# Patient Record
Sex: Female | Born: 1970 | Race: White | Hispanic: No | Smoking: Current every day smoker
Health system: Southern US, Community
[De-identification: ages and names within clinical notes are randomized; demographics above are authoritative.]

## PROBLEM LIST (undated history)

## (undated) DIAGNOSIS — T7840XA Allergy, unspecified, initial encounter: Secondary | ICD-10-CM

## (undated) DIAGNOSIS — F419 Anxiety disorder, unspecified: Secondary | ICD-10-CM

## (undated) DIAGNOSIS — F319 Bipolar disorder, unspecified: Secondary | ICD-10-CM

## (undated) DIAGNOSIS — G709 Myoneural disorder, unspecified: Secondary | ICD-10-CM

## (undated) DIAGNOSIS — J449 Chronic obstructive pulmonary disease, unspecified: Secondary | ICD-10-CM

## (undated) DIAGNOSIS — G4730 Sleep apnea, unspecified: Secondary | ICD-10-CM

## (undated) DIAGNOSIS — J45909 Unspecified asthma, uncomplicated: Secondary | ICD-10-CM

## (undated) DIAGNOSIS — F1910 Other psychoactive substance abuse, uncomplicated: Secondary | ICD-10-CM

## (undated) DIAGNOSIS — E119 Type 2 diabetes mellitus without complications: Secondary | ICD-10-CM

## (undated) DIAGNOSIS — M1990 Unspecified osteoarthritis, unspecified site: Secondary | ICD-10-CM

## (undated) DIAGNOSIS — F32A Depression, unspecified: Secondary | ICD-10-CM

## (undated) DIAGNOSIS — F329 Major depressive disorder, single episode, unspecified: Secondary | ICD-10-CM

## (undated) DIAGNOSIS — C801 Malignant (primary) neoplasm, unspecified: Secondary | ICD-10-CM

## (undated) DIAGNOSIS — I10 Essential (primary) hypertension: Secondary | ICD-10-CM

## (undated) DIAGNOSIS — M810 Age-related osteoporosis without current pathological fracture: Secondary | ICD-10-CM

## (undated) HISTORY — DX: Major depressive disorder, single episode, unspecified: F32.9

## (undated) HISTORY — DX: Depression, unspecified: F32.A

## (undated) HISTORY — DX: Myoneural disorder, unspecified: G70.9

## (undated) HISTORY — DX: Chronic obstructive pulmonary disease, unspecified: J44.9

## (undated) HISTORY — DX: Sleep apnea, unspecified: G47.30

## (undated) HISTORY — DX: Allergy, unspecified, initial encounter: T78.40XA

## (undated) HISTORY — PX: ANKLE SURGERY: SHX546

## (undated) HISTORY — DX: Unspecified osteoarthritis, unspecified site: M19.90

## (undated) HISTORY — DX: Anxiety disorder, unspecified: F41.9

## (undated) HISTORY — DX: Age-related osteoporosis without current pathological fracture: M81.0

## (undated) HISTORY — DX: Bipolar disorder, unspecified: F31.9

---

## 1989-07-25 HISTORY — PX: PERIPHERAL VASCULAR THROMBECTOMY: CATH118306

## 2004-09-25 ENCOUNTER — Emergency Department: Payer: Self-pay | Admitting: Emergency Medicine

## 2007-02-18 ENCOUNTER — Inpatient Hospital Stay: Payer: Self-pay | Admitting: General Practice

## 2007-03-09 ENCOUNTER — Emergency Department: Payer: Self-pay | Admitting: Emergency Medicine

## 2008-02-26 ENCOUNTER — Inpatient Hospital Stay: Payer: Self-pay | Admitting: Internal Medicine

## 2008-02-26 ENCOUNTER — Inpatient Hospital Stay: Payer: Self-pay | Admitting: Psychiatry

## 2010-12-21 ENCOUNTER — Emergency Department: Payer: Self-pay | Admitting: Emergency Medicine

## 2010-12-22 ENCOUNTER — Emergency Department: Payer: Self-pay | Admitting: Emergency Medicine

## 2011-07-07 ENCOUNTER — Inpatient Hospital Stay: Payer: Self-pay | Admitting: Psychiatry

## 2011-10-04 ENCOUNTER — Emergency Department: Payer: Self-pay | Admitting: *Deleted

## 2011-10-04 LAB — CBC
HCT: 36.5 % (ref 35.0–47.0)
HGB: 12.2 g/dL (ref 12.0–16.0)
MCHC: 33.3 g/dL (ref 32.0–36.0)
MCV: 89 fL (ref 80–100)
Platelet: 213 10*3/uL (ref 150–440)
RDW: 14 % (ref 11.5–14.5)

## 2011-10-04 LAB — ETHANOL: Ethanol %: 0.109 % — ABNORMAL HIGH (ref 0.000–0.080)

## 2011-10-04 LAB — COMPREHENSIVE METABOLIC PANEL
Albumin: 3.9 g/dL (ref 3.4–5.0)
Alkaline Phosphatase: 60 U/L (ref 50–136)
Calcium, Total: 8.3 mg/dL — ABNORMAL LOW (ref 8.5–10.1)
Co2: 22 mmol/L (ref 21–32)
EGFR (Non-African Amer.): 59 — ABNORMAL LOW
Osmolality: 279 (ref 275–301)
SGOT(AST): 31 U/L (ref 15–37)
SGPT (ALT): 29 U/L

## 2011-10-04 LAB — SALICYLATE LEVEL: Salicylates, Serum: 4.2 mg/dL — ABNORMAL HIGH

## 2011-10-05 LAB — DRUG SCREEN, URINE
Amphetamines, Ur Screen: NEGATIVE (ref ?–1000)
Barbiturates, Ur Screen: NEGATIVE (ref ?–200)
Benzodiazepine, Ur Scrn: NEGATIVE (ref ?–200)
Cannabinoid 50 Ng, Ur ~~LOC~~: POSITIVE (ref ?–50)
Cocaine Metabolite,Ur ~~LOC~~: POSITIVE (ref ?–300)
MDMA (Ecstasy)Ur Screen: NEGATIVE (ref ?–500)
Methadone, Ur Screen: NEGATIVE (ref ?–300)
Phencyclidine (PCP) Ur S: NEGATIVE (ref ?–25)
Tricyclic, Ur Screen: NEGATIVE (ref ?–1000)

## 2011-10-05 LAB — URINALYSIS, COMPLETE
Bilirubin,UR: NEGATIVE
Hyaline Cast: 21
Ph: 5 (ref 4.5–8.0)
Protein: 30
Specific Gravity: 1.02 (ref 1.003–1.030)
Squamous Epithelial: 4
WBC UR: 29 /HPF (ref 0–5)

## 2011-10-06 ENCOUNTER — Emergency Department: Payer: Self-pay | Admitting: Emergency Medicine

## 2011-12-04 ENCOUNTER — Emergency Department: Payer: Self-pay | Admitting: Emergency Medicine

## 2011-12-04 LAB — URINALYSIS, COMPLETE
Bacteria: NONE SEEN
Glucose,UR: NEGATIVE mg/dL (ref 0–75)
Nitrite: NEGATIVE
Ph: 6 (ref 4.5–8.0)
Protein: 100
RBC,UR: 12 /HPF (ref 0–5)
Specific Gravity: 1.027 (ref 1.003–1.030)
WBC UR: 10 /HPF (ref 0–5)

## 2011-12-06 ENCOUNTER — Emergency Department: Payer: Self-pay | Admitting: Emergency Medicine

## 2011-12-06 LAB — DRUG SCREEN, URINE
Benzodiazepine, Ur Scrn: NEGATIVE (ref ?–200)
Cannabinoid 50 Ng, Ur ~~LOC~~: NEGATIVE (ref ?–50)
Cocaine Metabolite,Ur ~~LOC~~: NEGATIVE (ref ?–300)
MDMA (Ecstasy)Ur Screen: NEGATIVE (ref ?–500)
Methadone, Ur Screen: NEGATIVE (ref ?–300)

## 2011-12-06 LAB — URINALYSIS, COMPLETE
Bilirubin,UR: NEGATIVE
Ketone: NEGATIVE
Ph: 5 (ref 4.5–8.0)
Protein: 30
RBC,UR: 7 /HPF (ref 0–5)
Specific Gravity: 1.019 (ref 1.003–1.030)
Squamous Epithelial: 18
WBC UR: 9 /HPF (ref 0–5)

## 2012-04-03 ENCOUNTER — Ambulatory Visit: Payer: Self-pay

## 2012-04-04 ENCOUNTER — Ambulatory Visit: Payer: Self-pay

## 2012-10-15 ENCOUNTER — Emergency Department: Payer: Self-pay | Admitting: Emergency Medicine

## 2012-10-15 LAB — URINALYSIS, COMPLETE
Bilirubin,UR: NEGATIVE
Glucose,UR: NEGATIVE mg/dL (ref 0–75)
Ketone: NEGATIVE
Nitrite: NEGATIVE
Ph: 6 (ref 4.5–8.0)
RBC,UR: 1 /HPF (ref 0–5)
Squamous Epithelial: <1
WBC UR: 5 /HPF (ref 0–5)

## 2012-10-15 LAB — COMPREHENSIVE METABOLIC PANEL
Alkaline Phosphatase: 82 U/L (ref 50–136)
Anion Gap: 5 — ABNORMAL LOW (ref 7–16)
Bilirubin,Total: 0.2 mg/dL (ref 0.2–1.0)
Calcium, Total: 8.3 mg/dL — ABNORMAL LOW (ref 8.5–10.1)
Chloride: 108 mmol/L — ABNORMAL HIGH (ref 98–107)
Co2: 28 mmol/L (ref 21–32)
Creatinine: 0.83 mg/dL (ref 0.60–1.30)
EGFR (African American): >60
Osmolality: 281 (ref 275–301)
Potassium: 3 mmol/L — ABNORMAL LOW (ref 3.5–5.1)
SGOT(AST): 31 U/L (ref 15–37)
SGPT (ALT): 29 U/L (ref 12–78)
Sodium: 141 mmol/L (ref 136–145)

## 2012-10-15 LAB — CBC
HCT: 40.4 % (ref 35.0–47.0)
HGB: 13.3 g/dL (ref 12.0–16.0)
MCH: 29.6 pg (ref 26.0–34.0)
MCHC: 33 g/dL (ref 32.0–36.0)
Platelet: 280 10*3/uL (ref 150–440)
RBC: 4.5 10*6/uL (ref 3.80–5.20)
WBC: 8.3 10*3/uL (ref 3.6–11.0)

## 2012-10-15 LAB — DRUG SCREEN, URINE
Amphetamines, Ur Screen: NEGATIVE (ref ?–1000)
Benzodiazepine, Ur Scrn: NEGATIVE (ref ?–200)
Cannabinoid 50 Ng, Ur ~~LOC~~: NEGATIVE (ref ?–50)
Cocaine Metabolite,Ur ~~LOC~~: POSITIVE (ref ?–300)
MDMA (Ecstasy)Ur Screen: NEGATIVE (ref ?–500)
Methadone, Ur Screen: NEGATIVE (ref ?–300)
Phencyclidine (PCP) Ur S: NEGATIVE (ref ?–25)
Tricyclic, Ur Screen: NEGATIVE (ref ?–1000)

## 2012-10-15 LAB — ETHANOL
Ethanol %: 0.232 % — ABNORMAL HIGH (ref 0.000–0.080)
Ethanol: 232 mg/dL

## 2012-10-15 LAB — ACETAMINOPHEN LEVEL: Acetaminophen: <2 ug/mL

## 2012-10-15 LAB — PREGNANCY, URINE: Pregnancy Test, Urine: NEGATIVE m[IU]/mL

## 2012-10-15 LAB — SALICYLATE LEVEL: Salicylates, Serum: 13.4 mg/dL — ABNORMAL HIGH

## 2012-11-25 ENCOUNTER — Emergency Department: Payer: Self-pay | Admitting: Internal Medicine

## 2012-11-25 LAB — URINALYSIS, COMPLETE
Bilirubin,UR: NEGATIVE
Blood: NEGATIVE
Ketone: NEGATIVE
Leukocyte Esterase: NEGATIVE
Nitrite: NEGATIVE
RBC,UR: <1 /HPF (ref 0–5)

## 2013-04-01 IMAGING — US ULTRASOUND LEFT BREAST
1 series · 14 of 23 positions shown · non-contrast
Comparison: none

REASON FOR EXAM: av lt parenchymal density [REDACTED]
COMMENTS:

PROCEDURE:     US  - US LT BREAST ([REDACTED])  - April 04, 2012  [DATE]
RESULT:     No cystic or solid abnormalities identified. It is suggested
that a 6 month followup left breast mammogram be performed to demonstrate
stability.

[Series 1: ultrasound left breast · 0.11mm/px · 14 of 23 slices shown]
[im 1/23]
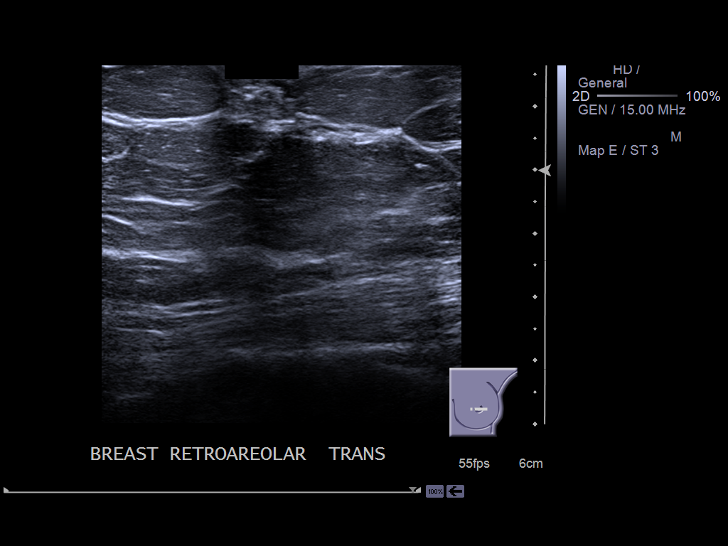
[im 3/23]
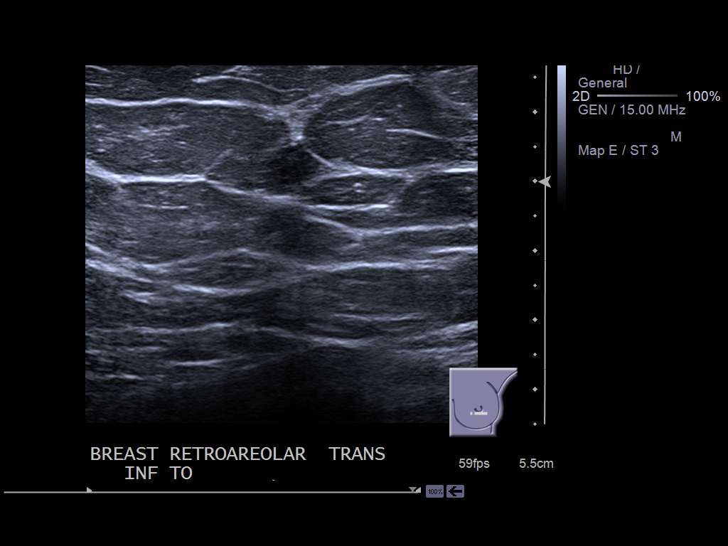
[im 5/23]
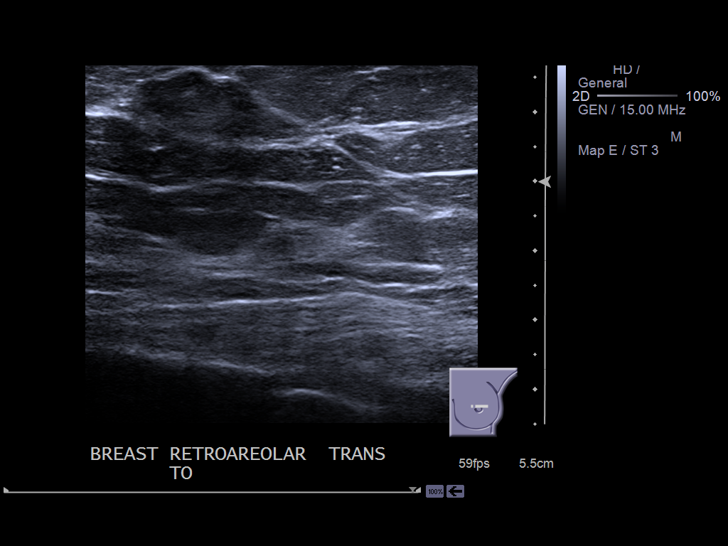
[im 6/23]
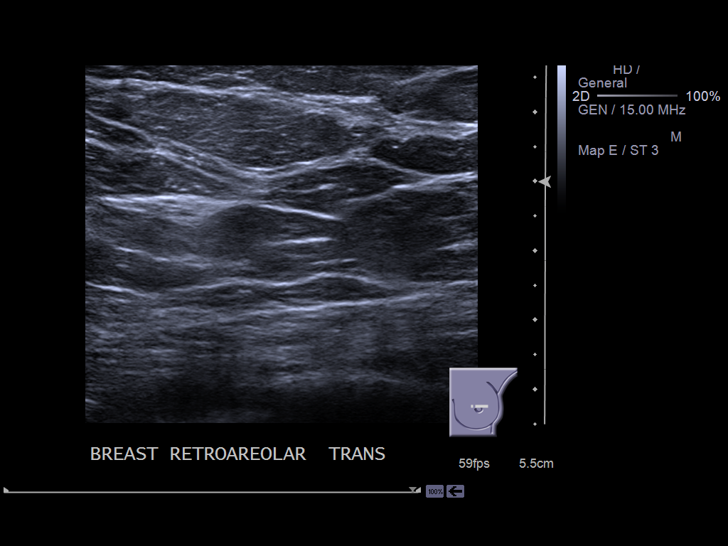
[im 8/23]
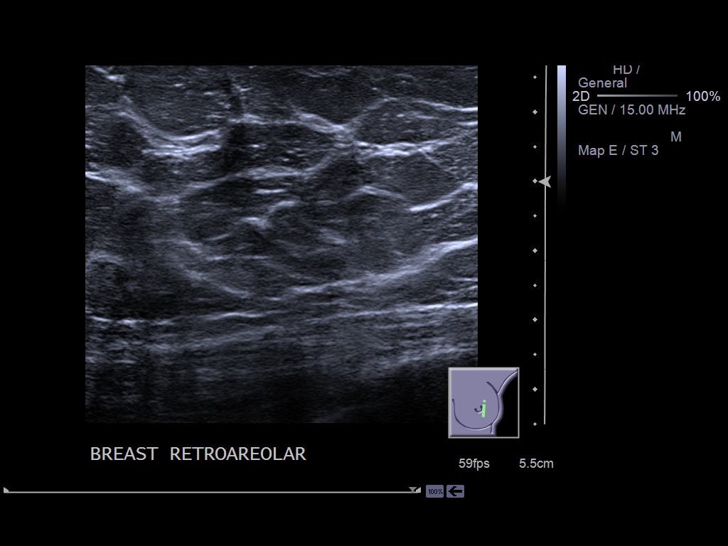
[im 10/23]
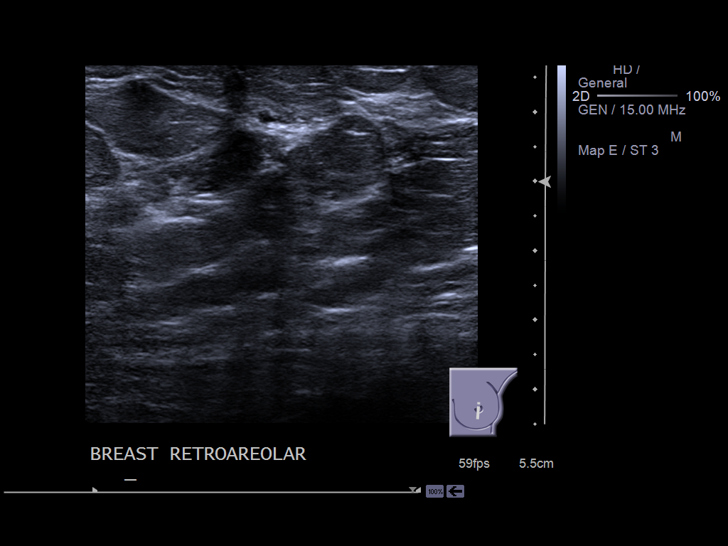
[im 11/23]
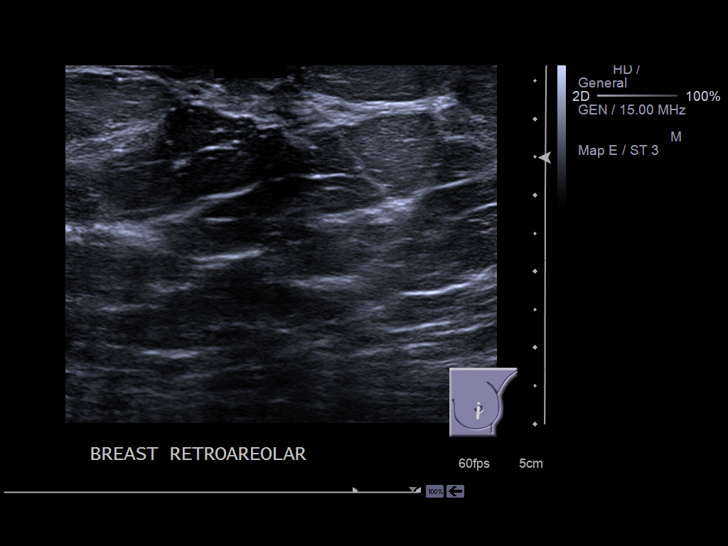
[im 13/23]
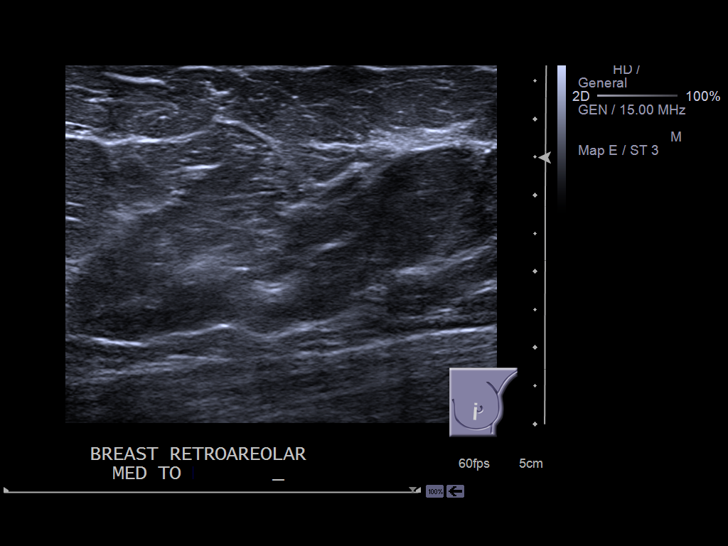
[im 14/23]
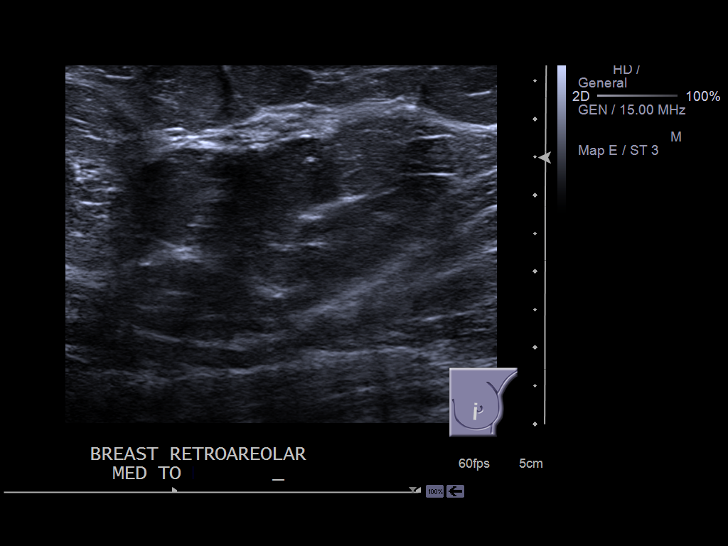
[im 16/23]
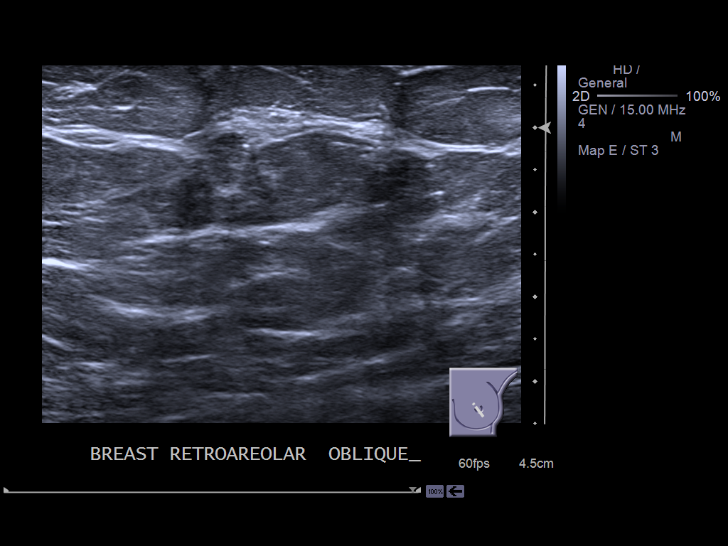
[im 18/23]
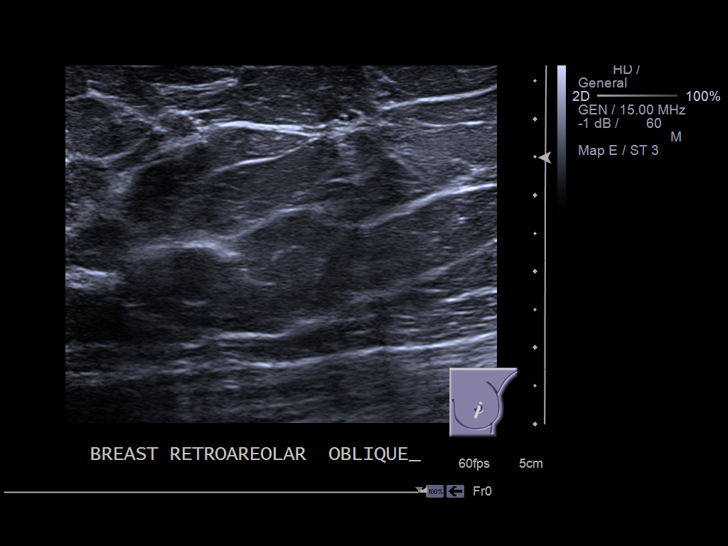
[im 19/23]
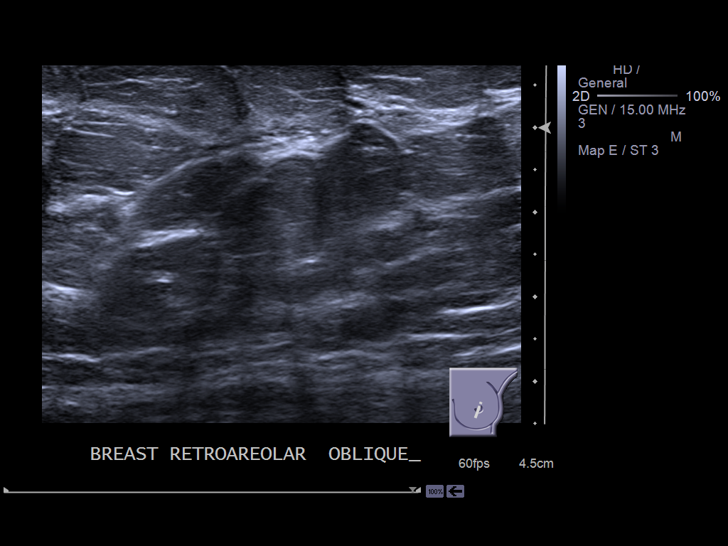
[im 21/23]
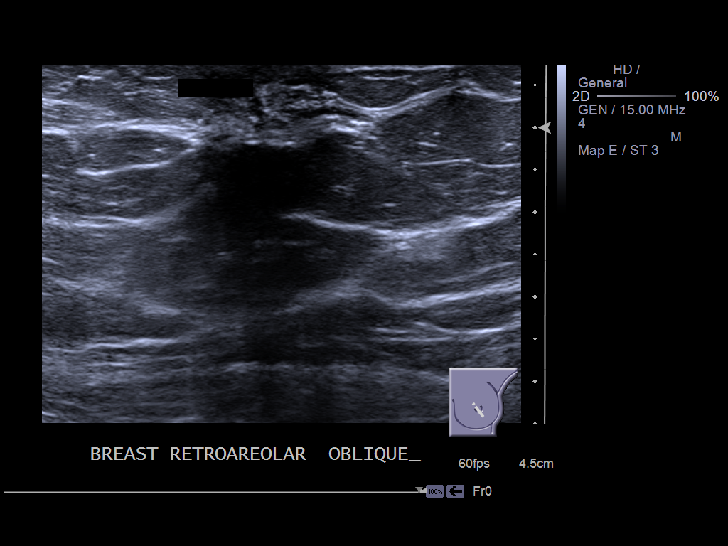
[im 23/23]
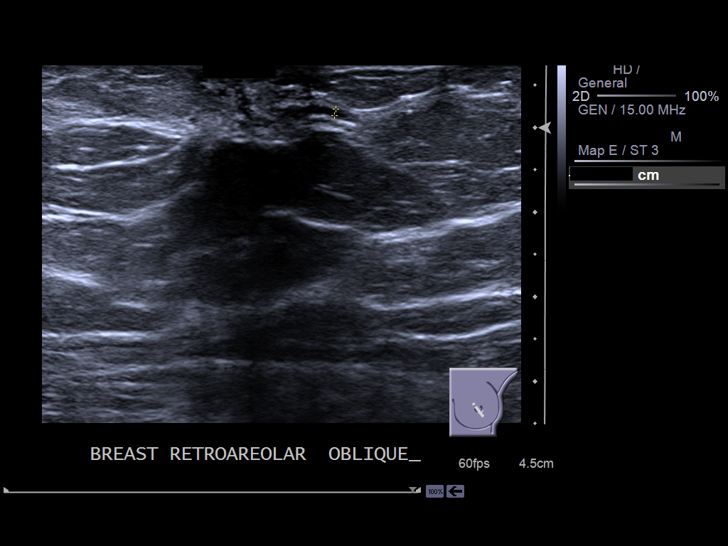

[14 of 23 positions shown; findings below may reference images not displayed]

IMPRESSION: BI-RADS: Category 3 - Probably Benign Finding - Initial
Short Interval Follow - Up Suggested

A NEGATIVE MAMMOGRAM REPORT DOES NOT PRECLUDE BIOPSY OR OTHER EVALUATION OF
A CLINICALLY PALPABLE OR OTHERWISE SUSPICIOUS MASS OR LESION. BREAST CANCER
MAY NOT BE DETECTED IN UP T0 10% OF CASES.

## 2013-04-01 IMAGING — MG MAM [facility] ADDED VIEW DIG LEFT
1 series · 5 of 5 positions shown · non-contrast
Comparison: none

REASON FOR EXAM: av lt parenchymal density [REDACTED]
COMMENTS:

[L ML · left · 5 of 5 slices shown]
[im 1/5]
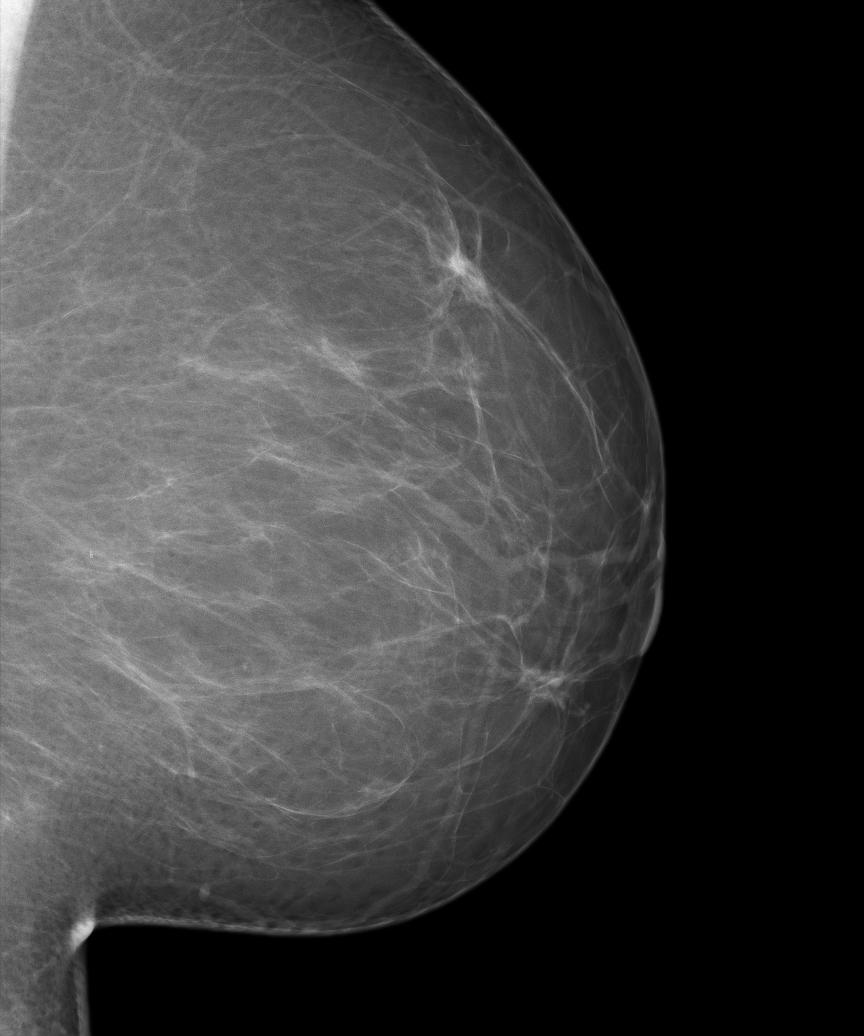
[im 2/5]
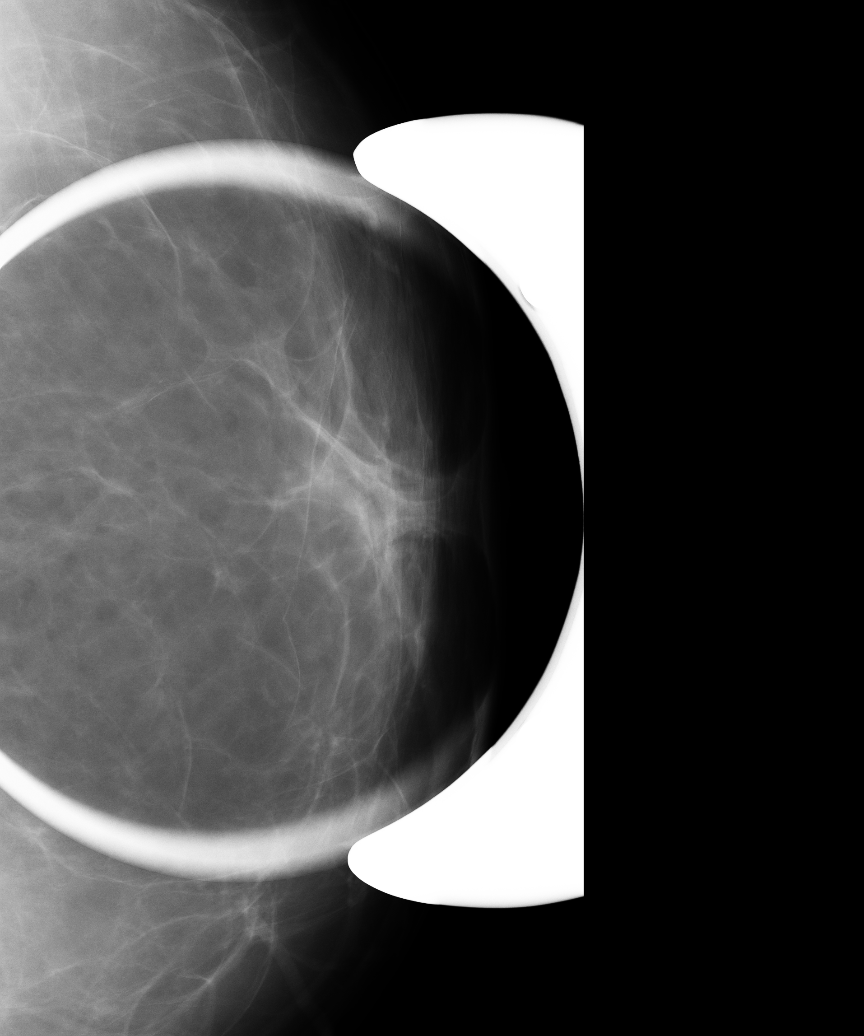
[im 3/5]
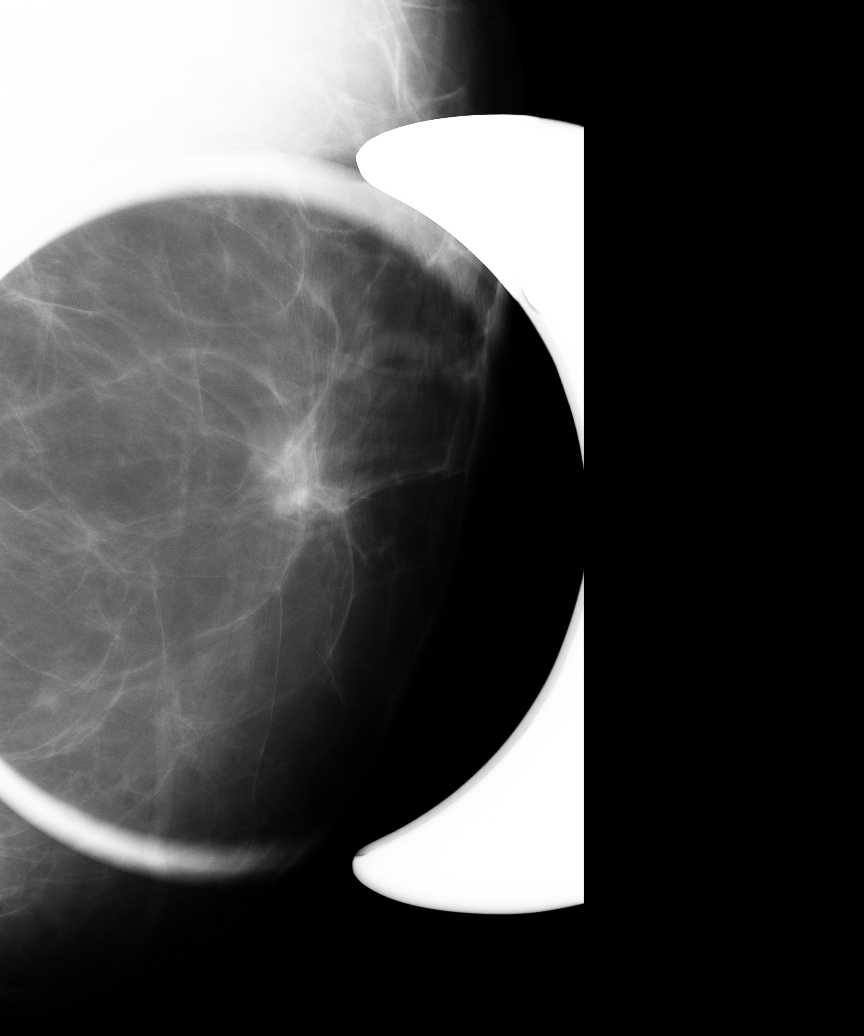
[im 4/5]
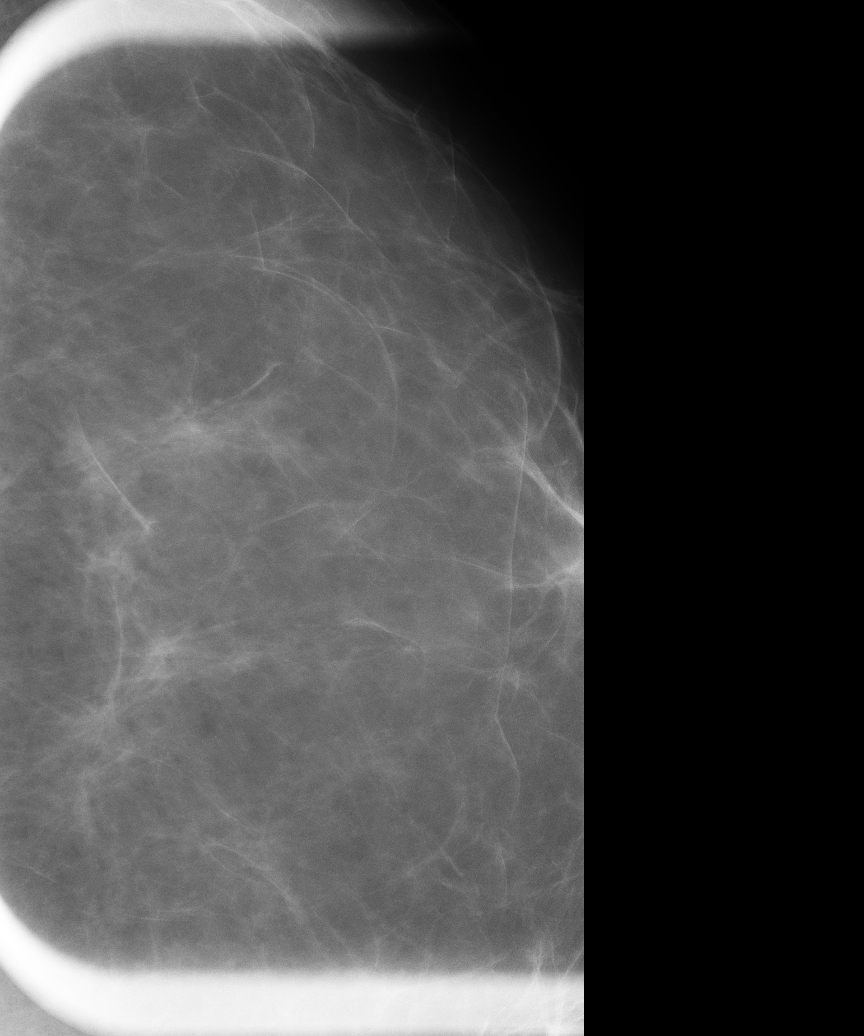
[im 5/5]
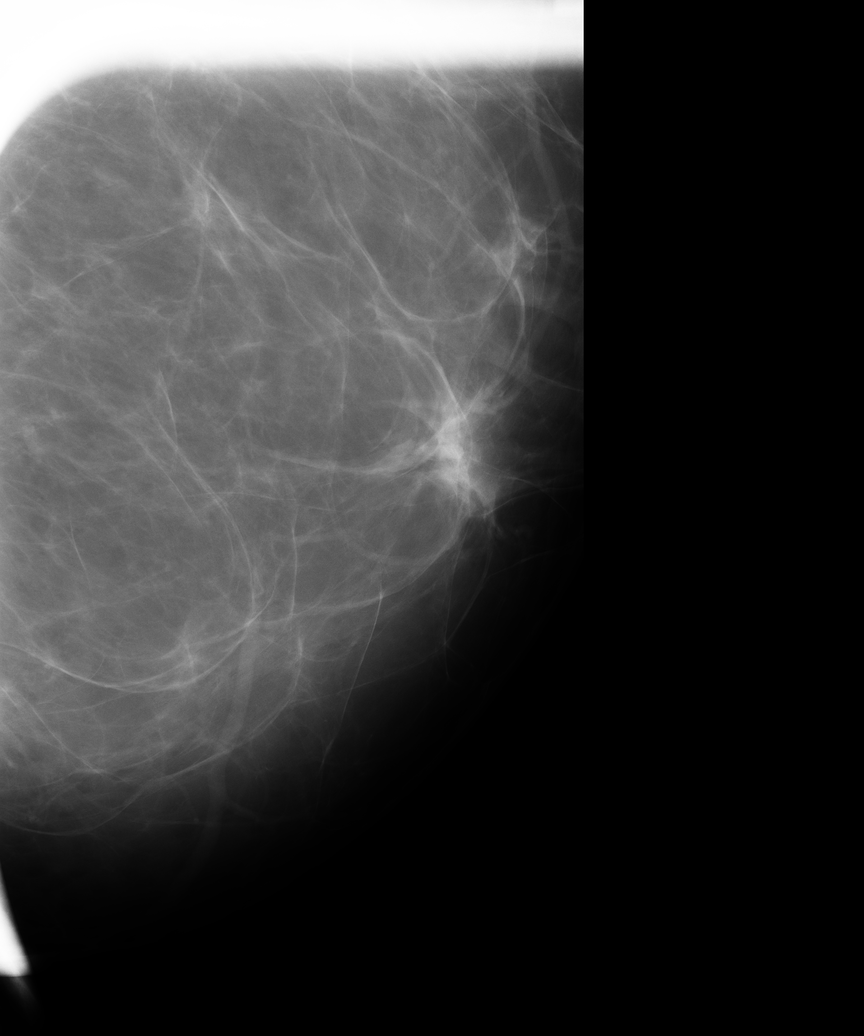

[5 of 5 positions shown; findings below may reference images not displayed]

PROCEDURE:     MAM - MAM [REDACTED] ADDED VIEW DIG LEFT  - April 04, 2012  [DATE]

RESULT:     Compression spot films performed of the retroareolar portion of
the left breast. No definite mass lesion is noted. Mild parenchymal
prominence which most likely represent overlapping parenchyma is present.
Ultrasound was performed to insure the absence of an underlying abnormality.
No abnormality identified. To assure stability followup left breast
mammogram is recommended in 6 months.
IMPRESSION: BI-RADS: Category 3 - Probably Benign Finding - Initial
Short Interval Follow - Up Suggested

A NEGATIVE MAMMOGRAM REPORT DOES NOT PRECLUDE BIOPSY OR OTHER EVALUATION OF
A CLINICALLY PALPABLE OR OTHERWISE SUSPICIOUS MASS OR LESION. BREAST CANCER
MAY NOT BE DETECTED IN UP T0 10% OF CASES.

## 2014-11-14 NOTE — Consult Note (Signed)
Details:   - Psychiatry: Patient seen. Patient took excess medication and was drinking. Current sobered up and not delirious and affect euthymic. Has good insight and is already involved in outpt treatment. No sign of current withdrawl symptoms. Not psychotic. Denies any suicidal ideation currently. Patient will be  taken off IVC and discharged home with follow up at [facility]. ER M.D. agrees to plan. Patient counceled on the dangerousness of her behavior and expresses understanding.   Electronic Signatures: Gonzella Lex (MD)  (Signed 26-Mar-14 13:46)  Authored: Details   Last Updated: 26-Mar-14 13:46 by Gonzella Lex (MD)
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
## 2014-11-16 NOTE — Consult Note (Signed)
Patient is not at risk to harm self or others. is psych-clear for discharge.  Electronic Signatures: Billie Ruddy (MD)  (Signed on 13-Mar-13 15:02)  Authored  Last Updated: 13-Mar-13 15:02 by Billie Ruddy (MD)

## 2015-10-15 ENCOUNTER — Ambulatory Visit: Payer: Self-pay

## 2015-10-22 ENCOUNTER — Ambulatory Visit: Payer: Self-pay | Admitting: Ophthalmology

## 2015-10-29 ENCOUNTER — Ambulatory Visit: Payer: Self-pay | Admitting: Ophthalmology

## 2015-10-29 ENCOUNTER — Ambulatory Visit: Payer: Self-pay | Admitting: Internal Medicine

## 2015-10-29 VITALS — BP 156/105 | HR 102

## 2015-10-29 DIAGNOSIS — J301 Allergic rhinitis due to pollen: Secondary | ICD-10-CM

## 2015-10-29 DIAGNOSIS — I10 Essential (primary) hypertension: Secondary | ICD-10-CM

## 2015-10-29 DIAGNOSIS — E119 Type 2 diabetes mellitus without complications: Secondary | ICD-10-CM | POA: Insufficient documentation

## 2015-10-29 DIAGNOSIS — E1140 Type 2 diabetes mellitus with diabetic neuropathy, unspecified: Secondary | ICD-10-CM

## 2015-10-29 DIAGNOSIS — J309 Allergic rhinitis, unspecified: Secondary | ICD-10-CM | POA: Insufficient documentation

## 2015-10-29 MED ORDER — OMEPRAZOLE 20 MG PO CPDR: 20.0000 mg | DELAYED_RELEASE_CAPSULE | Freq: Every day | ORAL | Status: DC

## 2015-10-29 MED ORDER — METFORMIN HCL 500 MG PO TABS: 500.0000 mg | ORAL_TABLET | Freq: Two times a day (BID) | ORAL | Status: DC

## 2015-10-29 MED ORDER — LORATADINE 10 MG PO TABS: 10.0000 mg | ORAL_TABLET | Freq: Every day | ORAL | Status: DC

## 2015-10-29 MED ORDER — HYDROCHLOROTHIAZIDE 25 MG PO TABS: 25.0000 mg | ORAL_TABLET | Freq: Every day | ORAL | Status: DC

## 2015-10-29 MED ORDER — GLIMEPIRIDE 2 MG PO TABS: 2.0000 mg | ORAL_TABLET | Freq: Every day | ORAL | Status: DC

## 2015-10-29 MED ORDER — CARVEDILOL 25 MG PO TABS: 25.0000 mg | ORAL_TABLET | Freq: Two times a day (BID) | ORAL | Status: DC

## 2015-10-29 NOTE — Progress Notes (Signed)
Andrea Sanford is a 45 y.o. female   SUBJECTIVE:  Pt recently out of prison, dx'd with HTN and DM in prison with weight gain there. Has gained more since home, c/o painful neuropathy and sugars averaging 250 preprandial.  ______________________________________________________________________       No past medical history on file.  No past surgical history on file.  PHYSICAL EXAM:  BP 156/105 mmHg  Pulse 102  Wt Readings from Last 3 Encounters:  No data found for Wt           BP Readings from Last 3 Encounters:  10/29/15 156/105    Constitutional: NAD Neck: supple, no thyromegaly Respiratory: CTA, no rales or wheezes Cardiovascular: RRR, no murmur, no gallop Abdomen: soft, good BS, nontender Extremities: no edema   ASSESSMENT/PLAN:  Labs and imaging studies were reviewed  DM- metformen 500mg  bid and add amaryl 2mg  qd, long diet discussion HTN- coreg 25mg  bid with HCTZ 25mg  qd, lisinopril insuff for BP GERD- omepraole qam wiehgt loss AR- claritin 1 mo f/u to ck labs and HTN

## 2015-11-26 ENCOUNTER — Ambulatory Visit: Payer: Self-pay

## 2016-01-28 ENCOUNTER — Telehealth: Payer: Self-pay

## 2016-01-28 DIAGNOSIS — I10 Essential (primary) hypertension: Secondary | ICD-10-CM

## 2016-01-28 MED ORDER — HYDROCHLOROTHIAZIDE 25 MG PO TABS: 25.0000 mg | ORAL_TABLET | Freq: Every day | ORAL | Status: DC

## 2016-02-02 ENCOUNTER — Telehealth: Payer: Self-pay

## 2016-02-02 DIAGNOSIS — K219 Gastro-esophageal reflux disease without esophagitis: Secondary | ICD-10-CM

## 2016-02-02 MED ORDER — OMEPRAZOLE 20 MG PO CPDR: 20.0000 mg | DELAYED_RELEASE_CAPSULE | Freq: Every day | ORAL | Status: DC

## 2016-02-08 NOTE — Telephone Encounter (Signed)
Done

## 2016-02-17 ENCOUNTER — Ambulatory Visit: Payer: Self-pay | Admitting: Internal Medicine

## 2016-02-18 NOTE — Telephone Encounter (Signed)
Refill complete 

## 2017-08-08 ENCOUNTER — Ambulatory Visit: Payer: Self-pay | Admitting: Pharmacy Technician

## 2017-08-08 ENCOUNTER — Encounter (INDEPENDENT_AMBULATORY_CARE_PROVIDER_SITE_OTHER): Payer: Self-pay

## 2017-08-08 DIAGNOSIS — Z79899 Other long term (current) drug therapy: Secondary | ICD-10-CM

## 2017-08-10 NOTE — Progress Notes (Signed)
Completed [facility] application and contract.  Patient agreed to all terms of the [facility] contract.    Patient to provide pay stubs for Jan 2019 when available.    Provided patient with community resource material based on her particular needs.  Patient approved to receive medication assistance through 2019, as long as eligibility criteria continues to be met.     [facility]

## 2017-08-17 ENCOUNTER — Ambulatory Visit: Payer: Self-pay

## 2017-08-29 ENCOUNTER — Ambulatory Visit: Payer: Self-pay | Admitting: Adult Health Nurse Practitioner

## 2017-08-29 ENCOUNTER — Encounter: Payer: Self-pay | Admitting: Licensed Clinical Social Worker

## 2017-08-29 ENCOUNTER — Ambulatory Visit: Payer: Self-pay | Admitting: Licensed Clinical Social Worker

## 2017-08-29 VITALS — BP 133/88 | HR 97 | Temp 98.0°F | Wt 232.5 lb

## 2017-08-29 DIAGNOSIS — E119 Type 2 diabetes mellitus without complications: Secondary | ICD-10-CM

## 2017-08-29 DIAGNOSIS — F310 Bipolar disorder, current episode hypomanic: Secondary | ICD-10-CM

## 2017-08-29 DIAGNOSIS — K219 Gastro-esophageal reflux disease without esophagitis: Secondary | ICD-10-CM

## 2017-08-29 DIAGNOSIS — E1140 Type 2 diabetes mellitus with diabetic neuropathy, unspecified: Secondary | ICD-10-CM

## 2017-08-29 DIAGNOSIS — I10 Essential (primary) hypertension: Secondary | ICD-10-CM

## 2017-08-29 MED ORDER — GLIMEPIRIDE 2 MG PO TABS: 2.0000 mg | ORAL_TABLET | Freq: Every day | ORAL | 3 refills | Status: DC

## 2017-08-29 MED ORDER — METFORMIN HCL 500 MG PO TABS: 500.0000 mg | ORAL_TABLET | Freq: Two times a day (BID) | ORAL | 3 refills | Status: DC

## 2017-08-29 MED ORDER — CARVEDILOL 25 MG PO TABS: 25.0000 mg | ORAL_TABLET | Freq: Two times a day (BID) | ORAL | 3 refills | Status: DC

## 2017-08-29 MED ORDER — HYDROCHLOROTHIAZIDE 25 MG PO TABS: 25.0000 mg | ORAL_TABLET | Freq: Every day | ORAL | 3 refills | Status: DC

## 2017-08-29 MED ORDER — GABAPENTIN 300 MG PO CAPS: 300.0000 mg | ORAL_CAPSULE | Freq: Three times a day (TID) | ORAL | 0 refills | Status: DC

## 2017-08-29 MED ORDER — OMEPRAZOLE 20 MG PO CPDR: 20.0000 mg | DELAYED_RELEASE_CAPSULE | Freq: Every day | ORAL | 3 refills | Status: DC

## 2017-08-29 NOTE — Progress Notes (Signed)
Patient recently became homeless and is staying at [facility]. She quit her job at [employer]. She is currently a patient at [facility] for medication management, peer support, and group therapy. She explains that she didn't get along with her peer support specialist and doesn't like attending groups. She reports that she is prescribed Effexor ER 150 mg daily for depression and Seroquel 200 mg at bedtime for mood stabilization by Dr. Ernie Hew at [facility]. She notes that she has the support of her brother who will help her out on occasion with transportation to appointments. She notes that she is currently on parole and has a ankle monitor for felony larceny. She notes that her criminal background has been a barrier on getting and keeping jobs. She notes that she has previously been diagnosed with Bipolar disorder and anxiety. She reports that she has a history of abusing alcohol. She notes that she is on food stamps. She reports that she was recently approved for SSI and Medicaid but isn't sure when either will kick in.

## 2017-08-29 NOTE — Progress Notes (Signed)
Patient: Andrea Sanford Female    DOB: 06/27/1971   47 y.o.   MRN: [id number] Visit Date: 08/29/2017  Today's Provider: Staci Acosta, NP   Chief Complaint  Patient presents with  . Gastroesophageal Reflux  . Nausea  . Diabetes    Neuropathy in feed-- itching, burning, pain-- worse since halving metformin dose   . Arthritis   Subjective:    HPI Pt here for medication refills.   States that her feet burn and tingle.  Started 3-4 months ago, more in the toes.  Reports checking feet daily, no open areas.   Pt states that her stomach is sour 2-3 days then it will settle down.  Reports she feels like her stomach is burning on the inside that will sometimes result in nausea or vomiting.  Pt states that she had bright red blood in her stool for 2 days in a row 2 weeks ago and then resolved on its own.   Pt states that she was recently kicked out and is now staying at the shelter.     No Known Allergies Previous Medications   CARVEDILOL (COREG) 25 MG TABLET    Take 1 tablet (25 mg total) by mouth 2 (two) times daily with a meal.   GLIMEPIRIDE (AMARYL) 2 MG TABLET    Take 1 tablet (2 mg total) by mouth daily with breakfast.   HYDROCHLOROTHIAZIDE (HYDRODIURIL) 25 MG TABLET    Take 1 tablet (25 mg total) by mouth daily.   LORATADINE (CLARITIN) 10 MG TABLET    Take 1 tablet (10 mg total) by mouth daily.   METFORMIN (GLUCOPHAGE) 500 MG TABLET    Take 1 tablet (500 mg total) by mouth 2 (two) times daily with a meal.   OMEPRAZOLE (PRILOSEC) 20 MG CAPSULE    Take 1 capsule (20 mg total) by mouth daily.    Review of Systems  All other systems reviewed and are negative.   Social History   Tobacco Use  . Smoking status: Current Every Day Smoker    Packs/day: 1.00  . Smokeless tobacco: Never Used  Substance Use Topics  . Alcohol use: Yes    Alcohol/week: 1.2 oz    Types: 2 Standard drinks or equivalent per week   Objective:   BP 133/88 (BP Location: Left Arm, Patient  Position: Sitting, Cuff Size: Normal)   Pulse 97   Temp 98 F (36.7 C)   Wt 232 lb 8 oz (105.5 kg)   Physical Exam  Constitutional: She is oriented to person, place, and time. She appears well-developed and well-nourished.  HENT:  Head: Normocephalic and atraumatic.  Eyes: Pupils are equal, round, and reactive to light.  Neck: Normal range of motion. Neck supple.  Cardiovascular: Normal rate, regular rhythm, normal heart sounds and intact distal pulses.  Pulmonary/Chest: Effort normal and breath sounds normal.  Abdominal: Soft. Bowel sounds are normal.  Musculoskeletal: Normal range of motion. She exhibits no edema.  Neurological: She is alert and oriented to person, place, and time.  Skin: Skin is warm and dry.  Psychiatric:  Anxious behaviors.  Vitals reviewed.       Assessment & Plan:         HTN:  Controlled.  Goal BP <140/90.  Continue current medication regimen.  Encourage low salt diet and exercise.   DM:  Will check A1c Encourage diabetic diet and exercise.  Continue current medication regimen.  Will prescribe gabapentin 300mg  QHS for neuropathy.  Discussed foot checks and to not  walk barefoot.   GERD:  Restart omeprazole.  Avoid triggers.   Routine labs ordered. Given iFOBT.  Referred to [facility] for therapy.  Medically managed at [facility].   FU in 4 weeks for lab review and to evaluate neuropathy management.      Staci Acosta, NP   [facility]

## 2017-08-30 LAB — COMPREHENSIVE METABOLIC PANEL
A/G RATIO: 1.5 (ref 1.2–2.2)
ALT: 13 IU/L (ref 0–32)
AST: 20 IU/L (ref 0–40)
Albumin: 4.4 g/dL (ref 3.5–5.5)
Alkaline Phosphatase: 99 IU/L (ref 39–117)
BUN/Creatinine Ratio: 16 (ref 9–23)
BUN: 14 mg/dL (ref 6–24)
Bilirubin Total: <0.2 mg/dL (ref 0.0–1.2)
CALCIUM: 9.2 mg/dL (ref 8.7–10.2)
CO2: 22 mmol/L (ref 20–29)
Chloride: 99 mmol/L (ref 96–106)
Creatinine, Ser: 0.85 mg/dL (ref 0.57–1.00)
GFR calc Af Amer: 94 mL/min/{1.73_m2} (ref >59)
GFR calc non Af Amer: 81 mL/min/{1.73_m2} (ref >59)
GLOBULIN, TOTAL: 2.9 g/dL (ref 1.5–4.5)
Glucose: 124 mg/dL — ABNORMAL HIGH (ref 65–99)
POTASSIUM: 3.9 mmol/L (ref 3.5–5.2)
SODIUM: 140 mmol/L (ref 134–144)
Total Protein: 7.3 g/dL (ref 6.0–8.5)

## 2017-08-30 LAB — LIPID PANEL
Chol/HDL Ratio: 5.6 ratio — ABNORMAL HIGH (ref 0.0–4.4)
Cholesterol, Total: 206 mg/dL — ABNORMAL HIGH (ref 100–199)
HDL: 37 mg/dL — ABNORMAL LOW (ref >39)
Triglycerides: 451 mg/dL — ABNORMAL HIGH (ref 0–149)

## 2017-08-30 LAB — CBC
HEMATOCRIT: 42.4 % (ref 34.0–46.6)
Hemoglobin: 14.1 g/dL (ref 11.1–15.9)
MCH: 29.7 pg (ref 26.6–33.0)
MCHC: 33.3 g/dL (ref 31.5–35.7)
MCV: 89 fL (ref 79–97)
PLATELETS: 271 10*3/uL (ref 150–379)
RBC: 4.75 x10E6/uL (ref 3.77–5.28)
RDW: 14.2 % (ref 12.3–15.4)
WBC: 10.5 10*3/uL (ref 3.4–10.8)

## 2017-08-30 LAB — HEMOGLOBIN A1C
ESTIMATED AVERAGE GLUCOSE: 120 mg/dL
HEMOGLOBIN A1C: 5.8 % — AB (ref 4.8–5.6)

## 2017-08-30 LAB — TSH: TSH: 1.87 u[IU]/mL (ref 0.450–4.500)

## 2017-08-31 ENCOUNTER — Telehealth: Payer: Self-pay

## 2017-08-31 ENCOUNTER — Other Ambulatory Visit: Payer: Self-pay

## 2017-08-31 ENCOUNTER — Ambulatory Visit: Payer: Self-pay

## 2017-08-31 DIAGNOSIS — Z0000 Encounter for general adult medical examination without abnormal findings: Secondary | ICD-10-CM
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
## 2017-09-02 LAB — FECAL OCCULT BLOOD, IMMUNOCHEMICAL: Fecal Occult Bld: NEGATIVE

## 2017-09-05 ENCOUNTER — Ambulatory Visit: Payer: Self-pay | Admitting: Licensed Clinical Social Worker

## 2017-09-11 ENCOUNTER — Other Ambulatory Visit: Payer: Self-pay | Admitting: Urology

## 2017-09-12 ENCOUNTER — Telehealth: Payer: Self-pay

## 2017-09-12 NOTE — Telephone Encounter (Signed)
Gave negative stool sample results to pt.

## 2017-09-14 ENCOUNTER — Emergency Department
Admission: EM | Admit: 2017-09-14 | Discharge: 2017-09-14 | Disposition: A | Discharge status: Home / Self Care | Payer: Medicaid Other | Attending: Student in an Organized Health Care Education/Training Program | Admitting: Student in an Organized Health Care Education/Training Program

## 2017-09-14 ENCOUNTER — Emergency Department: Payer: Medicaid Other

## 2017-09-14 ENCOUNTER — Other Ambulatory Visit: Payer: Self-pay

## 2017-09-14 ENCOUNTER — Encounter: Payer: Self-pay | Admitting: Emergency Medicine

## 2017-09-14 DIAGNOSIS — Z7984 Long term (current) use of oral hypoglycemic drugs: Secondary | ICD-10-CM | POA: Diagnosis not present

## 2017-09-14 DIAGNOSIS — G44319 Acute post-traumatic headache, not intractable: Secondary | ICD-10-CM | POA: Insufficient documentation

## 2017-09-14 DIAGNOSIS — R51 Headache: Secondary | ICD-10-CM | POA: Diagnosis present

## 2017-09-14 DIAGNOSIS — Z79899 Other long term (current) drug therapy: Secondary | ICD-10-CM | POA: Diagnosis not present

## 2017-09-14 DIAGNOSIS — I10 Essential (primary) hypertension: Secondary | ICD-10-CM | POA: Insufficient documentation

## 2017-09-14 DIAGNOSIS — E1140 Type 2 diabetes mellitus with diabetic neuropathy, unspecified: Secondary | ICD-10-CM | POA: Diagnosis not present

## 2017-09-14 DIAGNOSIS — F17200 Nicotine dependence, unspecified, uncomplicated: Secondary | ICD-10-CM | POA: Insufficient documentation

## 2017-09-14 MED ORDER — PROCHLORPERAZINE EDISYLATE 5 MG/ML IJ SOLN
INTRAMUSCULAR | Status: AC
  Administered 2017-09-14: 10 mg via INTRAMUSCULAR
  Filled 2017-09-14: qty 2

## 2017-09-14 MED ORDER — BUTALBITAL-APAP-CAFFEINE 50-325-40 MG PO TABS
1.0000 | ORAL_TABLET | Freq: Four times a day (QID) | ORAL | 0 refills | Status: AC | PRN
Start: 2017-09-14 — End: 2018-09-14

## 2017-09-14 MED ORDER — PROCHLORPERAZINE EDISYLATE 5 MG/ML IJ SOLN
10.0000 mg | Freq: Once | INTRAMUSCULAR | Status: AC
  Administered 2017-09-14: 10 mg via INTRAMUSCULAR

## 2017-09-14 MED ORDER — KETOROLAC TROMETHAMINE 30 MG/ML IJ SOLN
15.0000 mg | Freq: Once | INTRAMUSCULAR | Status: AC
  Administered 2017-09-14: 15 mg via INTRAMUSCULAR

## 2017-09-14 MED ORDER — KETOROLAC TROMETHAMINE 30 MG/ML IJ SOLN
INTRAMUSCULAR | Status: AC
  Administered 2017-09-14: 15 mg via INTRAMUSCULAR
  Filled 2017-09-14: qty 1
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
## 2017-09-14 NOTE — Discharge Instructions (Signed)

## 2017-09-26 ENCOUNTER — Ambulatory Visit: Payer: Self-pay

## 2017-09-26 ENCOUNTER — Ambulatory Visit: Payer: Self-pay | Admitting: Licensed Clinical Social Worker

## 2018-02-16 ENCOUNTER — Encounter: Payer: Medicaid Other | Admitting: Podiatry

## 2018-02-18 NOTE — Progress Notes (Signed)
This encounter was created in error - please disregard.

## 2018-03-02 ENCOUNTER — Encounter: Payer: Self-pay | Admitting: Podiatry

## 2018-03-02 ENCOUNTER — Ambulatory Visit: Payer: Medicaid Other

## 2018-03-02 ENCOUNTER — Ambulatory Visit: Payer: Medicaid Other | Admitting: Podiatry

## 2018-03-02 VITALS — BP 119/74 | HR 83 | Resp 16

## 2018-03-02 DIAGNOSIS — R234 Changes in skin texture: Secondary | ICD-10-CM | POA: Diagnosis not present

## 2018-03-05 NOTE — Progress Notes (Signed)
   HPI: 47 year old female presenting today as a new patient with a chief complaint of callused, cracked, dry skin of the bilateral great toes that has been ongoing for the past several years. She reports associated redness and swelling of the lateral border of the left hallux. She has had the areas filed down and applied various creams with no significant relief. There are no modifying factors noted. Patient is here for further evaluation and treatment.   Past Medical History:  Diagnosis Date  . Anxiety   . Depression      Physical Exam: General: The patient is alert and oriented x3 in no acute distress.  Dermatology: Skin fissures noted to bilateral great toes. Skin is warm, dry and supple bilateral lower extremities.   Vascular: Palpable pedal pulses bilaterally. No edema or erythema noted. Capillary refill within normal limits.  Neurological: Epicritic and protective threshold grossly intact bilaterally.   Musculoskeletal Exam: Range of motion within normal limits to all pedal and ankle joints bilateral. Muscle strength 5/5 in all groups bilateral.   Assessment: 1. Skin fissures bilateral great toes    Plan of Care:  1. Patient evaluated.  2. Recommended good foot lotion.  3. Patient goes barefoot all the time at home on hardwood floors. Advised against going barefoot.  4. Recommended good shoe gear.  5. Return to clinic as needed.       Edrick Kins, DPM [facility]  Dr. Edrick Kins, DPM    [address], [place] [address]                Office [phone]  Fax [phone]

## 2018-03-23 ENCOUNTER — Other Ambulatory Visit: Payer: Self-pay | Admitting: Student

## 2018-03-23 DIAGNOSIS — R112 Nausea with vomiting, unspecified: Secondary | ICD-10-CM

## 2018-03-23 DIAGNOSIS — R1084 Generalized abdominal pain: Secondary | ICD-10-CM

## 2018-03-23 DIAGNOSIS — R198 Other specified symptoms and signs involving the digestive system and abdomen: Secondary | ICD-10-CM

## 2018-03-27 ENCOUNTER — Other Ambulatory Visit
Admission: RE | Admit: 2018-03-27 | Discharge: 2018-03-27 | Disposition: A | Discharge status: Home / Self Care | Payer: Medicaid Other | Source: Ambulatory Visit | Attending: Student | Admitting: Student

## 2018-03-27 DIAGNOSIS — R198 Other specified symptoms and signs involving the digestive system and abdomen: Secondary | ICD-10-CM | POA: Diagnosis not present

## 2018-03-27 LAB — GASTROINTESTINAL PANEL BY PCR, STOOL (REPLACES STOOL CULTURE)
ADENOVIRUS F40/41: NOT DETECTED
Astrovirus: NOT DETECTED
CAMPYLOBACTER SPECIES: NOT DETECTED
CRYPTOSPORIDIUM: NOT DETECTED
CYCLOSPORA CAYETANENSIS: NOT DETECTED
ENTEROPATHOGENIC E COLI (EPEC): NOT DETECTED
Entamoeba histolytica: NOT DETECTED
Enteroaggregative E coli (EAEC): NOT DETECTED
Enterotoxigenic E coli (ETEC): NOT DETECTED
GIARDIA LAMBLIA: NOT DETECTED
Norovirus GI/GII: NOT DETECTED
PLESIMONAS SHIGELLOIDES: NOT DETECTED
Rotavirus A: NOT DETECTED
Salmonella species: NOT DETECTED
Sapovirus (I, II, IV, and V): NOT DETECTED
Shiga like toxin producing E coli (STEC): NOT DETECTED
Shigella/Enteroinvasive E coli (EIEC): NOT DETECTED
VIBRIO SPECIES: NOT DETECTED
Vibrio cholerae: NOT DETECTED
Yersinia enterocolitica: NOT DETECTED

## 2018-03-27 LAB — C DIFFICILE QUICK SCREEN W PCR REFLEX
C DIFFICILE (CDIFF) INTERP: NOT DETECTED
C Diff antigen: NEGATIVE
C Diff toxin: NEGATIVE

## 2018-03-28 LAB — CALPROTECTIN, FECAL: Calprotectin, Fecal: <16 ug/g (ref 0–120)

## 2018-03-30 ENCOUNTER — Ambulatory Visit
Admission: RE | Admit: 2018-03-30 | Discharge: 2018-03-30 | Disposition: A | Discharge status: Home / Self Care | Payer: Medicaid Other | Source: Ambulatory Visit | Attending: Student | Admitting: Student

## 2018-03-30 DIAGNOSIS — R1084 Generalized abdominal pain: Secondary | ICD-10-CM | POA: Diagnosis not present

## 2018-03-30 DIAGNOSIS — R112 Nausea with vomiting, unspecified: Secondary | ICD-10-CM | POA: Insufficient documentation

## 2018-03-30 DIAGNOSIS — K760 Fatty (change of) liver, not elsewhere classified: Secondary | ICD-10-CM | POA: Diagnosis not present

## 2018-03-30 DIAGNOSIS — R198 Other specified symptoms and signs involving the digestive system and abdomen: Secondary | ICD-10-CM | POA: Diagnosis not present

## 2018-03-30 HISTORY — DX: Type 2 diabetes mellitus without complications: E11.9

## 2018-03-30 HISTORY — DX: Unspecified asthma, uncomplicated: J45.909

## 2018-03-30 HISTORY — DX: Malignant (primary) neoplasm, unspecified: C80.1

## 2018-03-30 HISTORY — DX: Essential (primary) hypertension: I10

## 2018-03-30 MED ORDER — IOPAMIDOL (ISOVUE-300) INJECTION 61%
100.0000 mL | Freq: Once | INTRAVENOUS | Status: AC | PRN
  Administered 2018-03-30: 100 mL via INTRAVENOUS

## 2018-08-02 ENCOUNTER — Telehealth: Payer: Self-pay | Admitting: Pharmacy Technician

## 2018-08-02 NOTE — Telephone Encounter (Signed)
Patient has Medicaid.  No longer meets [facility]'s eligibility criteria.  Andrea Sanford Becvar Care Manager [facility] 

## 2019-03-27 IMAGING — CT CT ABD-PELV W/ CM
2 of 5 series · 16 of 46 positions shown, 18 images · IV contrast (iopamidol)
Comparison: None.

CLINICAL DATA: C/o vomiting x0mos, meds for nausea not helping,
diarrhea and constipation with cramping and pain, waking up during
the night with pain and diarrhea, unable to make it to bathroom, pt
feels nauseous/sick/exhausted/weary all the time for months.
Vomiting.

EXAM:
CT ABDOMEN AND PELVIS WITH CONTRAST
TECHNIQUE: Multidetector CT imaging of the abdomen and pelvis was performed
using the standard protocol following bolus administration of
intravenous contrast.
CONTRAST:  100mL WHSV2Z-500 IOPAMIDOL (WHSV2Z-500) INJECTION 61%

[Series 2: abd pelvis · axial · 0.88mm/px · z∈[-1637,-1172]mm · 13 of 105 slices shown, 15 images (1 of 2)]
[im 6/105  soft-tissue]
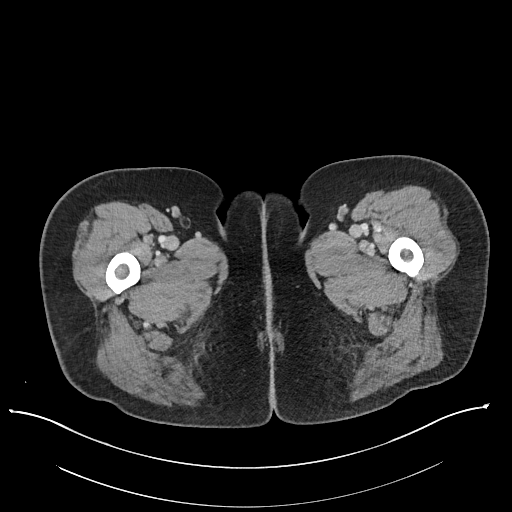
[im 6/105  bone]
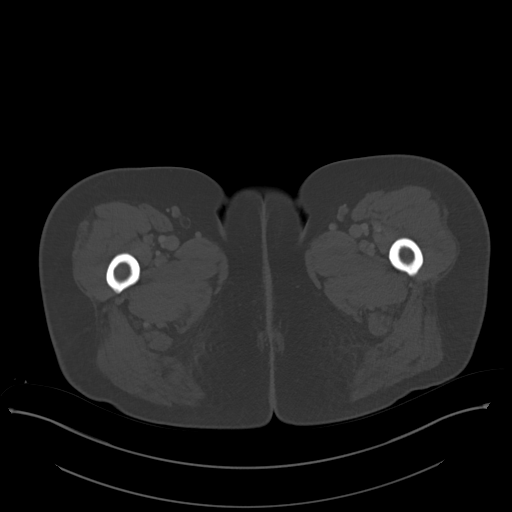
[im 16/105  soft-tissue]
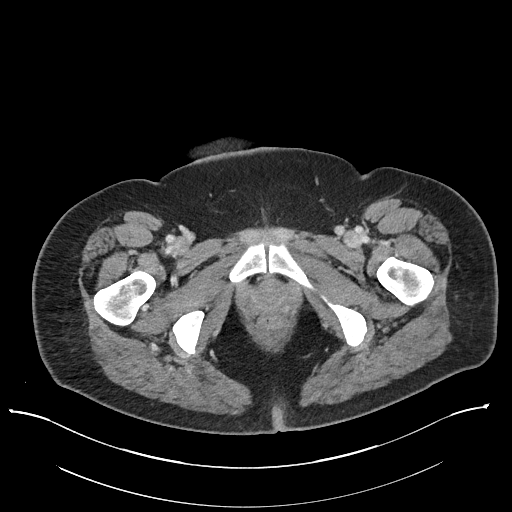
[im 21/105  soft-tissue]
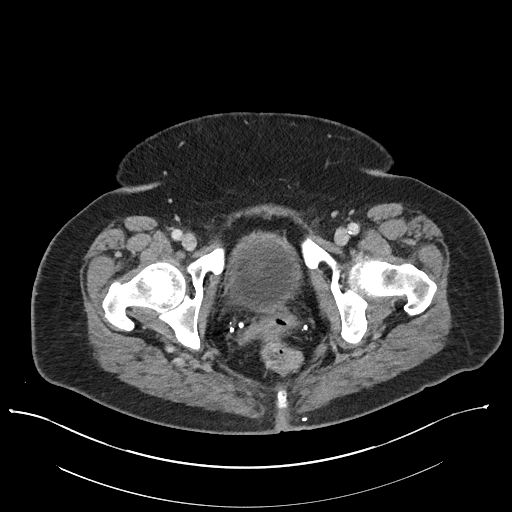
[im 32/105  soft-tissue]
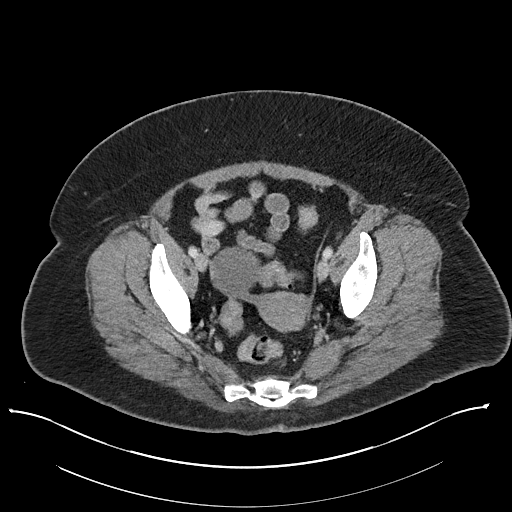
[im 37/105  soft-tissue]
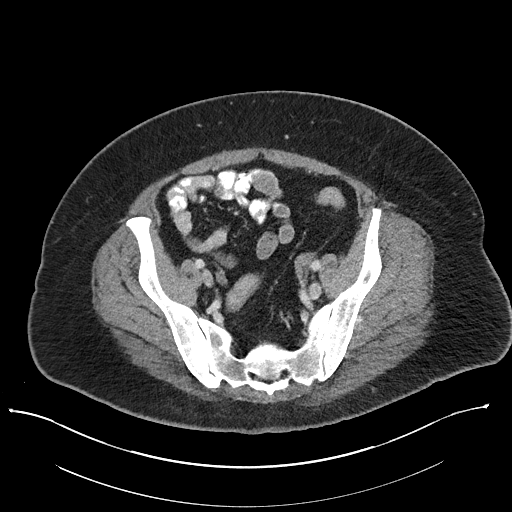
[im 47/105  soft-tissue]
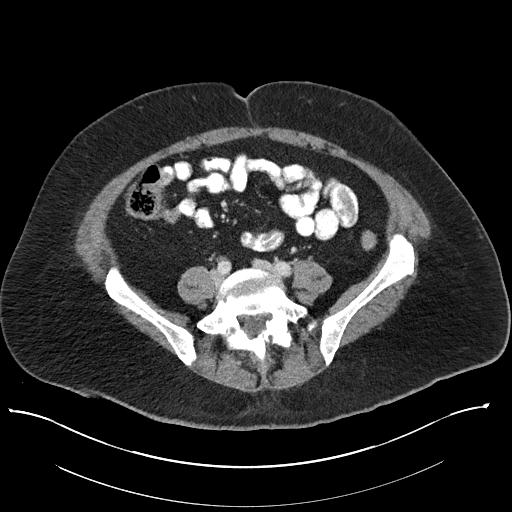
[im 53/105  soft-tissue]
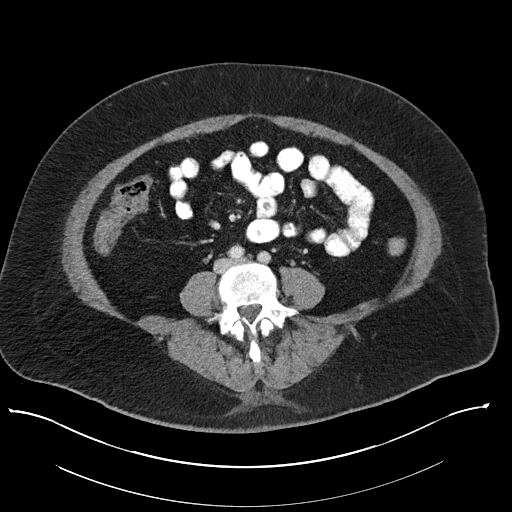
[im 58/105  soft-tissue]
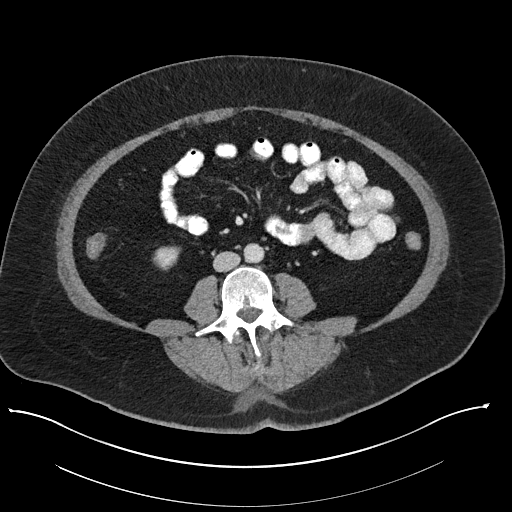
[im 68/105  soft-tissue]
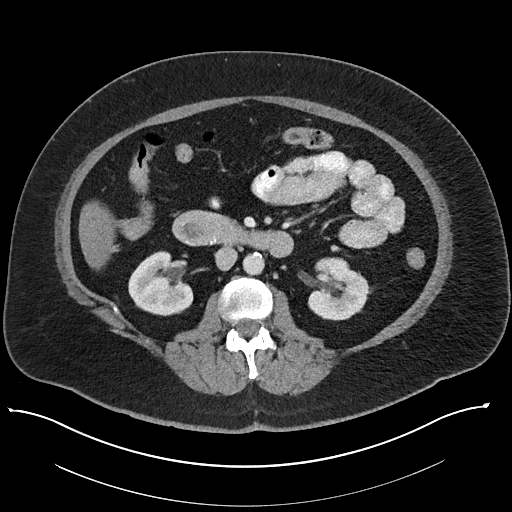
[im 68/105  bone]
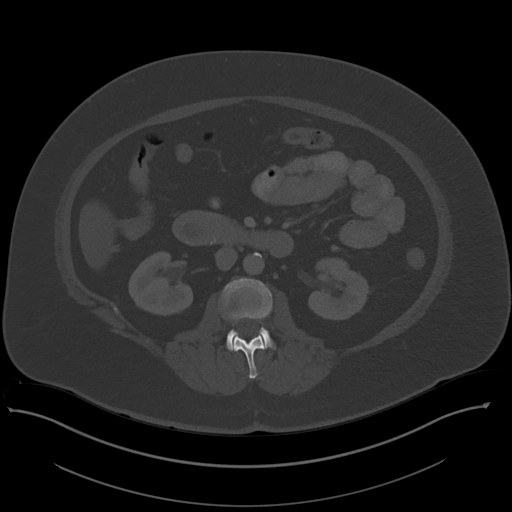
[im 73/105  soft-tissue]
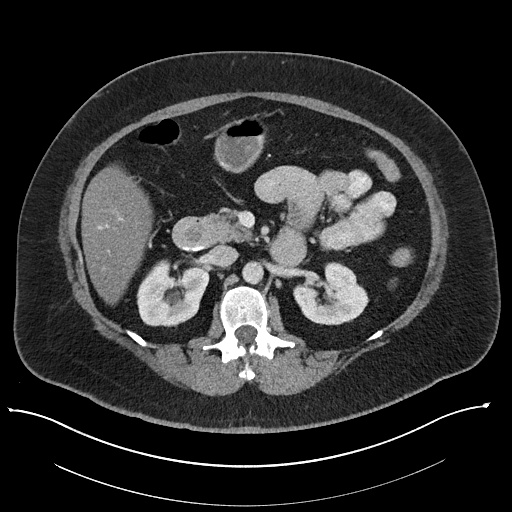
[im 84/105  soft-tissue]
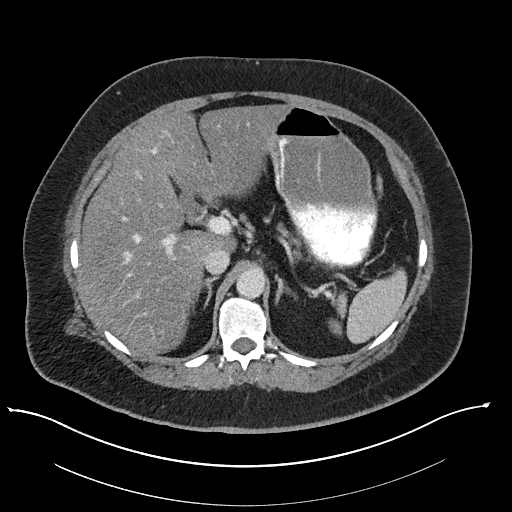
[im 89/105  soft-tissue]
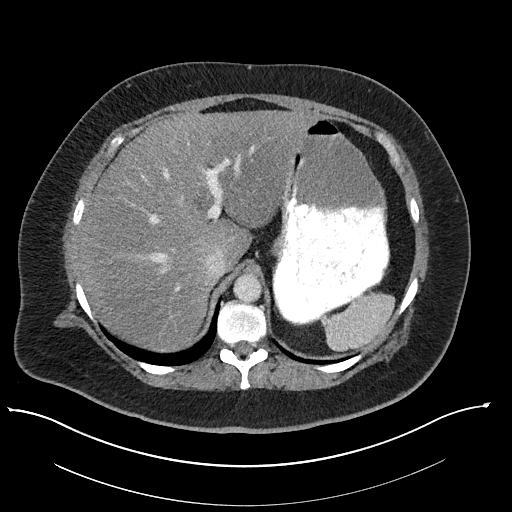
[im 99/105  soft-tissue]
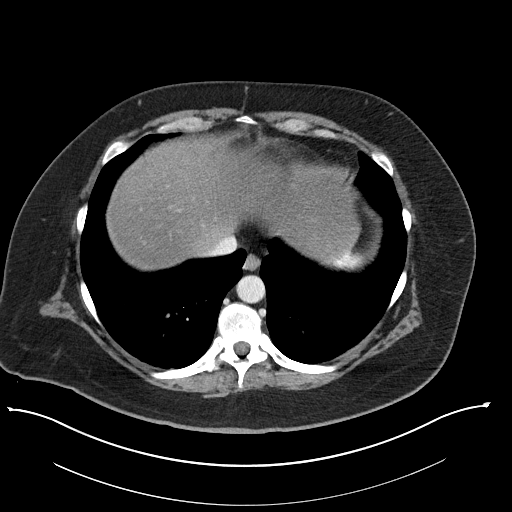

[Series 4: abd pelvis · coronal · 0.88mm/px · 3 of 163 slices shown (2 of 2)]
[im 55/163  soft-tissue]
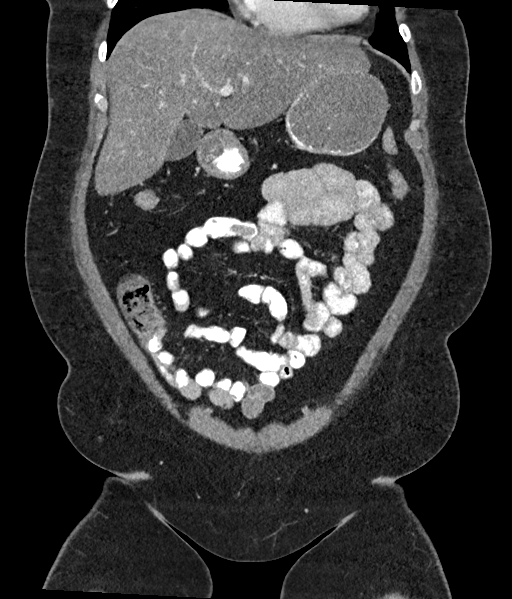
[im 73/163  soft-tissue]
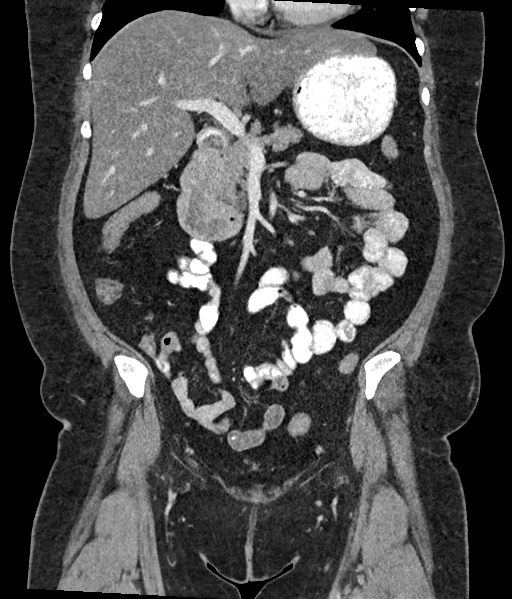
[im 91/163  soft-tissue]
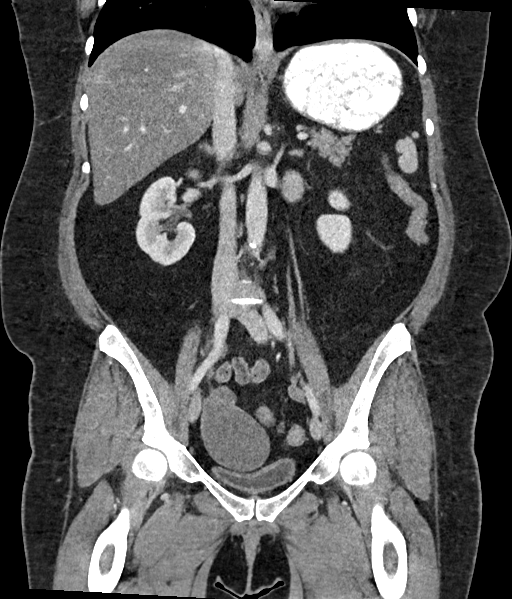

[16 of 46 positions shown; findings below may reference images not displayed]

FINDINGS: Lower chest: Lung bases are unremarkable. Heart size is normal. No
pericardial effusion or significant coronary artery calcifications.

Hepatobiliary: The liver is diffusely low attenuation. No focal
liver lesions. The gallbladder is present.

Pancreas: Unremarkable. No pancreatic ductal dilatation or
surrounding inflammatory changes.

Spleen: Normal in size without focal abnormality.

Adrenals/Urinary Tract: Normal adrenal glands. No intrarenal calculi
or mass. The ureters are normal in appearance. The bladder and
visualized portion of the urethra are normal.

Stomach/Bowel: Stomach is normal in appearance. Small bowel loops
are normal in caliber. The appendix is well seen and has a normal
appearance. Colon is normal in appearance. No inflammatory changes
or significant diverticulosis.

Vascular/Lymphatic: There is minimal atherosclerotic calcification
of the abdominal aorta. No aneurysm. Although involved by
atherosclerosis, there is vascular opacification of the celiac axis,
superior mesenteric artery, and inferior mesenteric artery. Normal
appearance of the portal venous system and inferior vena cava. No
retroperitoneal or mesenteric adenopathy.

Reproductive: The uterus is present. Within the RIGHT adnexal region
there is a low-attenuation mass measuring 5.6 x 6.4 centimeters.
LEFT adnexal region is unremarkable in appearance.

Other: No free pelvic fluid. Anterior abdominal wall is
unremarkable.

Musculoskeletal: No acute or significant osseous findings.
IMPRESSION: 1. Hepatic steatosis.
2. Normal appearance of the urinary tract.
3. RIGHT adnexal low-attenuation mass is likely ovarian in warrants
further evaluation given its size, 6.4 centimeters. Recommend pelvic
ultrasound for further characterization.
4. Normal appendix.

## 2019-07-09 DIAGNOSIS — F319 Bipolar disorder, unspecified: Secondary | ICD-10-CM | POA: Diagnosis not present

## 2019-07-11 DIAGNOSIS — Z79891 Long term (current) use of opiate analgesic: Secondary | ICD-10-CM | POA: Diagnosis not present

## 2019-07-11 DIAGNOSIS — G894 Chronic pain syndrome: Secondary | ICD-10-CM | POA: Diagnosis not present

## 2019-07-11 DIAGNOSIS — M549 Dorsalgia, unspecified: Secondary | ICD-10-CM | POA: Diagnosis not present

## 2019-07-11 DIAGNOSIS — Z79899 Other long term (current) drug therapy: Secondary | ICD-10-CM | POA: Diagnosis not present

## 2019-07-29 DIAGNOSIS — I10 Essential (primary) hypertension: Secondary | ICD-10-CM | POA: Diagnosis not present

## 2019-07-29 DIAGNOSIS — K219 Gastro-esophageal reflux disease without esophagitis: Secondary | ICD-10-CM | POA: Diagnosis not present

## 2019-07-29 DIAGNOSIS — F419 Anxiety disorder, unspecified: Secondary | ICD-10-CM | POA: Diagnosis not present

## 2019-07-29 DIAGNOSIS — Z716 Tobacco abuse counseling: Secondary | ICD-10-CM | POA: Diagnosis not present

## 2019-07-29 DIAGNOSIS — M545 Low back pain: Secondary | ICD-10-CM | POA: Diagnosis not present

## 2019-08-15 DIAGNOSIS — F419 Anxiety disorder, unspecified: Secondary | ICD-10-CM | POA: Diagnosis not present

## 2019-08-15 DIAGNOSIS — K219 Gastro-esophageal reflux disease without esophagitis: Secondary | ICD-10-CM | POA: Diagnosis not present

## 2019-08-15 DIAGNOSIS — E7800 Pure hypercholesterolemia, unspecified: Secondary | ICD-10-CM | POA: Diagnosis not present

## 2019-08-15 DIAGNOSIS — N3281 Overactive bladder: Secondary | ICD-10-CM | POA: Diagnosis not present

## 2019-08-15 DIAGNOSIS — Z716 Tobacco abuse counseling: Secondary | ICD-10-CM | POA: Diagnosis not present

## 2019-08-15 DIAGNOSIS — I10 Essential (primary) hypertension: Secondary | ICD-10-CM | POA: Diagnosis not present

## 2019-08-15 DIAGNOSIS — E119 Type 2 diabetes mellitus without complications: Secondary | ICD-10-CM | POA: Diagnosis not present

## 2019-08-28 DIAGNOSIS — F329 Major depressive disorder, single episode, unspecified: Secondary | ICD-10-CM | POA: Diagnosis not present

## 2019-08-28 DIAGNOSIS — L709 Acne, unspecified: Secondary | ICD-10-CM | POA: Diagnosis not present

## 2019-08-28 DIAGNOSIS — F419 Anxiety disorder, unspecified: Secondary | ICD-10-CM | POA: Diagnosis not present

## 2019-08-28 DIAGNOSIS — I10 Essential (primary) hypertension: Secondary | ICD-10-CM | POA: Diagnosis not present

## 2019-08-28 DIAGNOSIS — Z716 Tobacco abuse counseling: Secondary | ICD-10-CM | POA: Diagnosis not present

## 2019-09-25 DIAGNOSIS — M545 Low back pain: Secondary | ICD-10-CM | POA: Diagnosis not present

## 2019-09-25 DIAGNOSIS — Z79899 Other long term (current) drug therapy: Secondary | ICD-10-CM | POA: Diagnosis not present

## 2019-09-25 DIAGNOSIS — E559 Vitamin D deficiency, unspecified: Secondary | ICD-10-CM | POA: Diagnosis not present

## 2019-09-25 DIAGNOSIS — G894 Chronic pain syndrome: Secondary | ICD-10-CM | POA: Diagnosis not present

## 2019-09-25 DIAGNOSIS — M129 Arthropathy, unspecified: Secondary | ICD-10-CM | POA: Diagnosis not present

## 2019-09-30 DIAGNOSIS — F319 Bipolar disorder, unspecified: Secondary | ICD-10-CM | POA: Diagnosis not present

## 2019-09-30 DIAGNOSIS — Z79899 Other long term (current) drug therapy: Secondary | ICD-10-CM | POA: Diagnosis not present

## 2019-10-01 DIAGNOSIS — Z23 Encounter for immunization: Secondary | ICD-10-CM | POA: Diagnosis not present

## 2019-10-23 DIAGNOSIS — Z79899 Other long term (current) drug therapy: Secondary | ICD-10-CM | POA: Diagnosis not present

## 2019-10-23 DIAGNOSIS — G894 Chronic pain syndrome: Secondary | ICD-10-CM | POA: Diagnosis not present

## 2019-10-23 DIAGNOSIS — M545 Low back pain: Secondary | ICD-10-CM | POA: Diagnosis not present

## 2019-10-29 DIAGNOSIS — Z23 Encounter for immunization: Secondary | ICD-10-CM | POA: Diagnosis not present

## 2019-11-27 DIAGNOSIS — N390 Urinary tract infection, site not specified: Secondary | ICD-10-CM | POA: Diagnosis not present

## 2019-11-27 DIAGNOSIS — E119 Type 2 diabetes mellitus without complications: Secondary | ICD-10-CM | POA: Diagnosis not present

## 2019-11-27 DIAGNOSIS — F419 Anxiety disorder, unspecified: Secondary | ICD-10-CM | POA: Diagnosis not present

## 2019-11-27 DIAGNOSIS — Z716 Tobacco abuse counseling: Secondary | ICD-10-CM | POA: Diagnosis not present

## 2019-11-27 DIAGNOSIS — K219 Gastro-esophageal reflux disease without esophagitis: Secondary | ICD-10-CM | POA: Diagnosis not present

## 2019-11-27 DIAGNOSIS — I10 Essential (primary) hypertension: Secondary | ICD-10-CM | POA: Diagnosis not present

## 2019-12-05 DIAGNOSIS — M545 Low back pain: Secondary | ICD-10-CM | POA: Diagnosis not present

## 2019-12-05 DIAGNOSIS — G894 Chronic pain syndrome: Secondary | ICD-10-CM | POA: Diagnosis not present

## 2019-12-05 DIAGNOSIS — Z79899 Other long term (current) drug therapy: Secondary | ICD-10-CM | POA: Diagnosis not present

## 2019-12-10 DIAGNOSIS — F319 Bipolar disorder, unspecified: Secondary | ICD-10-CM | POA: Diagnosis not present

## 2019-12-30 DIAGNOSIS — Z79899 Other long term (current) drug therapy: Secondary | ICD-10-CM | POA: Diagnosis not present

## 2020-01-04 DIAGNOSIS — Z23 Encounter for immunization: Secondary | ICD-10-CM | POA: Diagnosis not present

## 2020-01-29 DIAGNOSIS — G894 Chronic pain syndrome: Secondary | ICD-10-CM | POA: Diagnosis not present

## 2020-01-29 DIAGNOSIS — M545 Low back pain: Secondary | ICD-10-CM | POA: Diagnosis not present

## 2020-01-29 DIAGNOSIS — Z79899 Other long term (current) drug therapy: Secondary | ICD-10-CM | POA: Diagnosis not present

## 2020-01-31 DIAGNOSIS — K219 Gastro-esophageal reflux disease without esophagitis: Secondary | ICD-10-CM | POA: Diagnosis not present

## 2020-01-31 DIAGNOSIS — E669 Obesity, unspecified: Secondary | ICD-10-CM | POA: Diagnosis not present

## 2020-01-31 DIAGNOSIS — I10 Essential (primary) hypertension: Secondary | ICD-10-CM | POA: Diagnosis not present

## 2020-01-31 DIAGNOSIS — Z87891 Personal history of nicotine dependence: Secondary | ICD-10-CM | POA: Diagnosis not present

## 2020-01-31 DIAGNOSIS — E7800 Pure hypercholesterolemia, unspecified: Secondary | ICD-10-CM | POA: Diagnosis not present

## 2020-01-31 DIAGNOSIS — Z716 Tobacco abuse counseling: Secondary | ICD-10-CM | POA: Diagnosis not present

## 2020-01-31 DIAGNOSIS — R0989 Other specified symptoms and signs involving the circulatory and respiratory systems: Secondary | ICD-10-CM | POA: Diagnosis not present

## 2020-01-31 DIAGNOSIS — F419 Anxiety disorder, unspecified: Secondary | ICD-10-CM | POA: Diagnosis not present

## 2020-01-31 DIAGNOSIS — E119 Type 2 diabetes mellitus without complications: Secondary | ICD-10-CM | POA: Diagnosis not present

## 2020-02-27 DIAGNOSIS — M545 Low back pain: Secondary | ICD-10-CM | POA: Diagnosis not present

## 2020-02-27 DIAGNOSIS — Z79899 Other long term (current) drug therapy: Secondary | ICD-10-CM | POA: Diagnosis not present

## 2020-02-27 DIAGNOSIS — G894 Chronic pain syndrome: Secondary | ICD-10-CM | POA: Diagnosis not present

## 2020-02-28 DIAGNOSIS — E669 Obesity, unspecified: Secondary | ICD-10-CM | POA: Diagnosis not present

## 2020-02-28 DIAGNOSIS — I10 Essential (primary) hypertension: Secondary | ICD-10-CM | POA: Diagnosis not present

## 2020-02-28 DIAGNOSIS — E119 Type 2 diabetes mellitus without complications: Secondary | ICD-10-CM | POA: Diagnosis not present

## 2020-02-28 DIAGNOSIS — N3281 Overactive bladder: Secondary | ICD-10-CM | POA: Diagnosis not present

## 2020-02-28 DIAGNOSIS — Z716 Tobacco abuse counseling: Secondary | ICD-10-CM | POA: Diagnosis not present

## 2020-02-28 DIAGNOSIS — F329 Major depressive disorder, single episode, unspecified: Secondary | ICD-10-CM | POA: Diagnosis not present

## 2020-03-12 DIAGNOSIS — Z23 Encounter for immunization: Secondary | ICD-10-CM | POA: Diagnosis not present

## 2020-03-27 DIAGNOSIS — M545 Low back pain: Secondary | ICD-10-CM | POA: Diagnosis not present

## 2020-03-27 DIAGNOSIS — G894 Chronic pain syndrome: Secondary | ICD-10-CM | POA: Diagnosis not present

## 2020-03-27 DIAGNOSIS — Z79899 Other long term (current) drug therapy: Secondary | ICD-10-CM | POA: Diagnosis not present

## 2020-04-01 DIAGNOSIS — E669 Obesity, unspecified: Secondary | ICD-10-CM | POA: Diagnosis not present

## 2020-04-08 DIAGNOSIS — H52223 Regular astigmatism, bilateral: Secondary | ICD-10-CM | POA: Diagnosis not present

## 2020-04-08 DIAGNOSIS — H5213 Myopia, bilateral: Secondary | ICD-10-CM | POA: Diagnosis not present

## 2020-04-20 DIAGNOSIS — H5213 Myopia, bilateral: Secondary | ICD-10-CM | POA: Diagnosis not present

## 2020-04-20 DIAGNOSIS — H52223 Regular astigmatism, bilateral: Secondary | ICD-10-CM | POA: Diagnosis not present

## 2020-04-27 DIAGNOSIS — Z79899 Other long term (current) drug therapy: Secondary | ICD-10-CM | POA: Diagnosis not present

## 2020-04-27 DIAGNOSIS — M545 Low back pain, unspecified: Secondary | ICD-10-CM | POA: Diagnosis not present

## 2020-04-27 DIAGNOSIS — G894 Chronic pain syndrome: Secondary | ICD-10-CM | POA: Diagnosis not present

## 2020-04-29 DIAGNOSIS — F319 Bipolar disorder, unspecified: Secondary | ICD-10-CM | POA: Diagnosis not present

## 2020-05-09 DIAGNOSIS — Z23 Encounter for immunization: Secondary | ICD-10-CM | POA: Diagnosis not present

## 2020-06-26 DIAGNOSIS — Z79899 Other long term (current) drug therapy: Secondary | ICD-10-CM | POA: Diagnosis not present

## 2020-06-26 DIAGNOSIS — E669 Obesity, unspecified: Secondary | ICD-10-CM | POA: Diagnosis not present

## 2020-06-26 DIAGNOSIS — G894 Chronic pain syndrome: Secondary | ICD-10-CM | POA: Diagnosis not present

## 2020-06-26 DIAGNOSIS — M545 Low back pain, unspecified: Secondary | ICD-10-CM | POA: Diagnosis not present

## 2020-07-28 ENCOUNTER — Ambulatory Visit: Payer: Self-pay | Admitting: Family Medicine

## 2020-08-03 DIAGNOSIS — M545 Low back pain, unspecified: Secondary | ICD-10-CM | POA: Diagnosis not present

## 2020-08-03 DIAGNOSIS — Z79899 Other long term (current) drug therapy: Secondary | ICD-10-CM | POA: Diagnosis not present

## 2020-08-03 DIAGNOSIS — G894 Chronic pain syndrome: Secondary | ICD-10-CM | POA: Diagnosis not present

## 2020-08-03 DIAGNOSIS — Z9189 Other specified personal risk factors, not elsewhere classified: Secondary | ICD-10-CM | POA: Diagnosis not present

## 2020-08-04 ENCOUNTER — Ambulatory Visit: Payer: Self-pay | Admitting: Family Medicine

## 2020-08-25 DIAGNOSIS — I10 Essential (primary) hypertension: Secondary | ICD-10-CM | POA: Diagnosis not present

## 2020-08-25 DIAGNOSIS — E118 Type 2 diabetes mellitus with unspecified complications: Secondary | ICD-10-CM | POA: Diagnosis not present

## 2020-08-25 DIAGNOSIS — E782 Mixed hyperlipidemia: Secondary | ICD-10-CM | POA: Diagnosis not present

## 2020-08-25 DIAGNOSIS — J449 Chronic obstructive pulmonary disease, unspecified: Secondary | ICD-10-CM | POA: Diagnosis not present

## 2020-08-28 DIAGNOSIS — M545 Low back pain, unspecified: Secondary | ICD-10-CM | POA: Diagnosis not present

## 2020-08-28 DIAGNOSIS — Z760 Encounter for issue of repeat prescription: Secondary | ICD-10-CM | POA: Diagnosis not present

## 2020-08-28 DIAGNOSIS — E669 Obesity, unspecified: Secondary | ICD-10-CM | POA: Diagnosis not present

## 2020-08-28 DIAGNOSIS — G894 Chronic pain syndrome: Secondary | ICD-10-CM | POA: Diagnosis not present

## 2020-08-28 DIAGNOSIS — Z79899 Other long term (current) drug therapy: Secondary | ICD-10-CM | POA: Diagnosis not present

## 2020-09-25 DIAGNOSIS — Z79899 Other long term (current) drug therapy: Secondary | ICD-10-CM | POA: Diagnosis not present

## 2020-10-15 DIAGNOSIS — F319 Bipolar disorder, unspecified: Secondary | ICD-10-CM | POA: Diagnosis not present

## 2020-10-26 DIAGNOSIS — Z79899 Other long term (current) drug therapy: Secondary | ICD-10-CM | POA: Diagnosis not present

## 2020-10-26 DIAGNOSIS — M545 Low back pain, unspecified: Secondary | ICD-10-CM | POA: Diagnosis not present

## 2020-11-23 DIAGNOSIS — R202 Paresthesia of skin: Secondary | ICD-10-CM | POA: Diagnosis not present

## 2020-11-23 DIAGNOSIS — J449 Chronic obstructive pulmonary disease, unspecified: Secondary | ICD-10-CM | POA: Diagnosis not present

## 2020-11-23 DIAGNOSIS — J441 Chronic obstructive pulmonary disease with (acute) exacerbation: Secondary | ICD-10-CM | POA: Diagnosis not present

## 2020-11-23 DIAGNOSIS — J0100 Acute maxillary sinusitis, unspecified: Secondary | ICD-10-CM | POA: Diagnosis not present

## 2020-11-26 DIAGNOSIS — Z79899 Other long term (current) drug therapy: Secondary | ICD-10-CM | POA: Diagnosis not present

## 2020-12-28 DIAGNOSIS — Z79899 Other long term (current) drug therapy: Secondary | ICD-10-CM | POA: Diagnosis not present

## 2021-01-18 DIAGNOSIS — G8929 Other chronic pain: Secondary | ICD-10-CM | POA: Diagnosis not present

## 2021-01-18 DIAGNOSIS — B379 Candidiasis, unspecified: Secondary | ICD-10-CM | POA: Diagnosis not present

## 2021-01-18 DIAGNOSIS — N3941 Urge incontinence: Secondary | ICD-10-CM | POA: Diagnosis not present

## 2021-01-18 DIAGNOSIS — M545 Low back pain, unspecified: Secondary | ICD-10-CM | POA: Diagnosis not present

## 2021-01-20 ENCOUNTER — Ambulatory Visit: Payer: Medicaid Other | Admitting: Podiatry

## 2021-01-20 ENCOUNTER — Ambulatory Visit (INDEPENDENT_AMBULATORY_CARE_PROVIDER_SITE_OTHER): Payer: Medicaid Other

## 2021-01-20 ENCOUNTER — Other Ambulatory Visit: Payer: Self-pay

## 2021-01-20 ENCOUNTER — Encounter: Payer: Self-pay | Admitting: Podiatry

## 2021-01-20 DIAGNOSIS — N83201 Unspecified ovarian cyst, right side: Secondary | ICD-10-CM | POA: Insufficient documentation

## 2021-01-20 DIAGNOSIS — M21612 Bunion of left foot: Secondary | ICD-10-CM

## 2021-01-20 DIAGNOSIS — M21611 Bunion of right foot: Secondary | ICD-10-CM

## 2021-01-20 DIAGNOSIS — M216X2 Other acquired deformities of left foot: Secondary | ICD-10-CM

## 2021-01-20 DIAGNOSIS — E119 Type 2 diabetes mellitus without complications: Secondary | ICD-10-CM

## 2021-01-20 DIAGNOSIS — M216X1 Other acquired deformities of right foot: Secondary | ICD-10-CM | POA: Diagnosis not present

## 2021-01-20 NOTE — Patient Instructions (Addendum)
Work on reducing your smoking and keep your blood sugar under control. You would need to stop smoking 6 weeks before and after surgery.   The procedure is called a Lapidus procedure

## 2021-01-20 NOTE — Progress Notes (Signed)
  Subjective:  Patient ID: Andrea Sanford, female    DOB: 02/14/1971,  MRN: [id number]  Chief Complaint  Patient presents with   [place] PAIN    50 y.o. female presents with the above complaint. History confirmed with patient.  The bunions have been present for many years have become increasingly bothersome.  She saw a Economist and is interested in surgical correction.  Objective:  Physical Exam: warm, good capillary refill, no trophic changes or ulcerative lesions, normal DP and PT pulses, and normal sensory exam.  Bilaterally she has hallux valgus with moderate to severe bunion formation.  Good range of motion.  Negative grind test.  Radiographs: Multiple views x-ray of both feet: Moderate hallux valgus with increased IM angle and abduction of the hallux Assessment:   1. Bilateral bunions   2. Diabetes mellitus without complication ([place])      Plan:  Patient was evaluated and treated and all questions answered.  Discussed etiology and treatment options for hallux valgus deformity.  We discussed the risk, benefits and postoperative course.  I discussed with her that she currently is a pack-a-day smoker and this would put her at high risk category for such a procedure such as wound healing problems and delayed or nonunion.  She also is diabetic and has not had her A1c checked recently.  I sent her an order for an A1c and she will work on smoking cessation.  We will reevaluate this in several months when she is able to quit smoking  Return in about 4 months (around 05/22/2021) for discuss bunion surgery.

## 2021-01-28 DIAGNOSIS — Z79899 Other long term (current) drug therapy: Secondary | ICD-10-CM | POA: Diagnosis not present

## 2021-02-04 DIAGNOSIS — F319 Bipolar disorder, unspecified: Secondary | ICD-10-CM | POA: Diagnosis not present

## 2021-02-24 DIAGNOSIS — Z79899 Other long term (current) drug therapy: Secondary | ICD-10-CM | POA: Diagnosis not present

## 2021-03-30 DIAGNOSIS — N898 Other specified noninflammatory disorders of vagina: Secondary | ICD-10-CM | POA: Diagnosis not present

## 2021-04-01 DIAGNOSIS — Z79899 Other long term (current) drug therapy: Secondary | ICD-10-CM | POA: Diagnosis not present

## 2021-04-19 DIAGNOSIS — Z23 Encounter for immunization: Secondary | ICD-10-CM | POA: Diagnosis not present

## 2021-04-29 DIAGNOSIS — Z79899 Other long term (current) drug therapy: Secondary | ICD-10-CM | POA: Diagnosis not present

## 2021-05-04 DIAGNOSIS — R202 Paresthesia of skin: Secondary | ICD-10-CM | POA: Diagnosis not present

## 2021-05-04 DIAGNOSIS — F319 Bipolar disorder, unspecified: Secondary | ICD-10-CM | POA: Diagnosis not present

## 2021-05-04 DIAGNOSIS — L84 Corns and callosities: Secondary | ICD-10-CM | POA: Diagnosis not present

## 2021-05-04 DIAGNOSIS — N393 Stress incontinence (female) (male): Secondary | ICD-10-CM | POA: Diagnosis not present

## 2021-05-11 DIAGNOSIS — Z23 Encounter for immunization: Secondary | ICD-10-CM | POA: Diagnosis not present

## 2021-05-12 ENCOUNTER — Ambulatory Visit: Payer: Medicaid Other | Admitting: Podiatry

## 2021-05-31 DIAGNOSIS — Z79899 Other long term (current) drug therapy: Secondary | ICD-10-CM | POA: Diagnosis not present

## 2021-07-01 DIAGNOSIS — Z79899 Other long term (current) drug therapy: Secondary | ICD-10-CM | POA: Diagnosis not present

## 2021-07-01 DIAGNOSIS — E559 Vitamin D deficiency, unspecified: Secondary | ICD-10-CM | POA: Diagnosis not present

## 2021-07-01 DIAGNOSIS — E119 Type 2 diabetes mellitus without complications: Secondary | ICD-10-CM | POA: Diagnosis not present

## 2021-07-29 DIAGNOSIS — Z79899 Other long term (current) drug therapy: Secondary | ICD-10-CM | POA: Diagnosis not present

## 2021-08-03 DIAGNOSIS — Z79899 Other long term (current) drug therapy: Secondary | ICD-10-CM | POA: Diagnosis not present

## 2021-09-01 DIAGNOSIS — Z79899 Other long term (current) drug therapy: Secondary | ICD-10-CM | POA: Diagnosis not present

## 2021-09-29 DIAGNOSIS — Z79899 Other long term (current) drug therapy: Secondary | ICD-10-CM | POA: Diagnosis not present

## 2021-10-27 DIAGNOSIS — F22 Delusional disorders: Secondary | ICD-10-CM | POA: Diagnosis not present

## 2021-10-27 DIAGNOSIS — Z6833 Body mass index (BMI) 33.0-33.9, adult: Secondary | ICD-10-CM | POA: Diagnosis not present

## 2021-11-02 DIAGNOSIS — Z79899 Other long term (current) drug therapy: Secondary | ICD-10-CM | POA: Diagnosis not present

## 2021-11-02 DIAGNOSIS — E559 Vitamin D deficiency, unspecified: Secondary | ICD-10-CM | POA: Diagnosis not present

## 2021-11-08 DIAGNOSIS — Z79899 Other long term (current) drug therapy: Secondary | ICD-10-CM | POA: Diagnosis not present

## 2021-12-06 DIAGNOSIS — F3132 Bipolar disorder, current episode depressed, moderate: Secondary | ICD-10-CM | POA: Diagnosis not present

## 2022-03-30 ENCOUNTER — Ambulatory Visit: Payer: Medicaid Other | Admitting: Podiatry

## 2022-04-07 DIAGNOSIS — L03317 Cellulitis of buttock: Secondary | ICD-10-CM | POA: Diagnosis not present

## 2022-04-07 DIAGNOSIS — F319 Bipolar disorder, unspecified: Secondary | ICD-10-CM | POA: Diagnosis not present

## 2022-04-07 DIAGNOSIS — E118 Type 2 diabetes mellitus with unspecified complications: Secondary | ICD-10-CM | POA: Diagnosis not present

## 2022-04-07 DIAGNOSIS — R202 Paresthesia of skin: Secondary | ICD-10-CM | POA: Diagnosis not present

## 2022-04-27 ENCOUNTER — Ambulatory Visit: Payer: Medicaid Other | Admitting: Podiatry

## 2022-07-04 DIAGNOSIS — F319 Bipolar disorder, unspecified: Secondary | ICD-10-CM | POA: Diagnosis not present

## 2022-07-05 DIAGNOSIS — F319 Bipolar disorder, unspecified: Secondary | ICD-10-CM | POA: Diagnosis not present

## 2022-07-05 DIAGNOSIS — Z79899 Other long term (current) drug therapy: Secondary | ICD-10-CM | POA: Diagnosis not present

## 2022-09-23 DIAGNOSIS — H5213 Myopia, bilateral: Secondary | ICD-10-CM | POA: Diagnosis not present

## 2022-09-26 ENCOUNTER — Ambulatory Visit: Payer: Medicaid Other | Admitting: Podiatry

## 2022-11-29 DIAGNOSIS — Z1211 Encounter for screening for malignant neoplasm of colon: Secondary | ICD-10-CM | POA: Diagnosis not present

## 2022-11-29 DIAGNOSIS — E118 Type 2 diabetes mellitus with unspecified complications: Secondary | ICD-10-CM | POA: Diagnosis not present

## 2022-11-29 DIAGNOSIS — I10 Essential (primary) hypertension: Secondary | ICD-10-CM | POA: Diagnosis not present

## 2022-11-29 DIAGNOSIS — R202 Paresthesia of skin: Secondary | ICD-10-CM | POA: Diagnosis not present

## 2022-12-22 ENCOUNTER — Ambulatory Visit: Payer: Medicaid Other | Admitting: Podiatry

## 2022-12-27 ENCOUNTER — Ambulatory Visit (INDEPENDENT_AMBULATORY_CARE_PROVIDER_SITE_OTHER): Payer: 59 | Admitting: Podiatry

## 2022-12-27 DIAGNOSIS — M722 Plantar fascial fibromatosis: Secondary | ICD-10-CM | POA: Diagnosis not present

## 2022-12-27 DIAGNOSIS — E119 Type 2 diabetes mellitus without complications: Secondary | ICD-10-CM | POA: Diagnosis not present

## 2022-12-27 DIAGNOSIS — M21962 Unspecified acquired deformity of left lower leg: Secondary | ICD-10-CM | POA: Diagnosis not present

## 2022-12-27 DIAGNOSIS — M21961 Unspecified acquired deformity of right lower leg: Secondary | ICD-10-CM | POA: Diagnosis not present

## 2022-12-27 DIAGNOSIS — J45909 Unspecified asthma, uncomplicated: Secondary | ICD-10-CM | POA: Diagnosis not present

## 2022-12-27 DIAGNOSIS — I10 Essential (primary) hypertension: Secondary | ICD-10-CM | POA: Diagnosis not present

## 2022-12-27 DIAGNOSIS — Z7984 Long term (current) use of oral hypoglycemic drugs: Secondary | ICD-10-CM | POA: Diagnosis not present

## 2022-12-27 NOTE — Progress Notes (Signed)
Subjective:  Patient ID: Andrea Sanford, female    DOB: [birth year],  MRN: [id number]  Chief Complaint  Patient presents with   Foot Pain    Pt stated that her feet burn all the time and it causes her a lot of discomfort     52 y.o. female presents with the above complaint.  Patient presents with bilateral heel pain that has been on for quite some time is progressive gotten worse worse with ambulation worse with pressure she has not seen MRIs prior to seeing me denies any other acute complaints.  She would like to discuss treatment options for this.  Pain scale 7 out of 10 hurts when taking for step in the morning.  Denies any other acute issues.   Review of Systems: Negative except as noted in the HPI. Denies N/V/F/Ch.  Past Medical History:  Diagnosis Date   Anxiety    Asthma    Cancer (HCC)    Depression    Diabetes mellitus without complication (HCC)    Hypertension     Current Outpatient Medications:    carvedilol (COREG) 25 MG tablet, Take 1 tablet (25 mg total) by mouth 2 (two) times daily with a meal., Disp: 60 tablet, Rfl: 3   Cholecalciferol (VITAMIN D3) 50000 units CAPS, TAKE ONE CAPSULE BY MOUTH ONE TIME PER WEEK, Disp: , Rfl: 3   gabapentin (NEURONTIN) 300 MG capsule, Take 1 capsule (300 mg total) by mouth 3 (three) times daily., Disp: 30 capsule, Rfl: 0   gabapentin (NEURONTIN) 600 MG tablet, Take 600 mg by mouth 3 (three) times daily., Disp: , Rfl:    glimepiride (AMARYL) 2 MG tablet, Take 1 tablet (2 mg total) by mouth daily with breakfast., Disp: 30 tablet, Rfl: 3   hydrochlorothiazide (HYDRODIURIL) 25 MG tablet, Take 1 tablet (25 mg total) by mouth daily., Disp: 30 tablet, Rfl: 3   hydrOXYzine (ATARAX/VISTARIL) 50 MG tablet, TAKE 1 TABLET BY MOUTH EVERY 6 TO 8 HOURS AS NEEDED FOR ANXIETY OR ITCHING, Disp: , Rfl: 3   hydrOXYzine (ATARAX/VISTARIL) 50 MG tablet, TAKE 1 TABLET BY MOUTH EVERY 6 TO 8 HOURS AS NEEDED FOR ANXIETY OR ITCHING, Disp: , Rfl:    ibuprofen  (ADVIL) 800 MG tablet, Take 800 mg by mouth 3 (three) times daily as needed., Disp: , Rfl:    KLOR-CON M20 20 MEQ tablet, Take 20 mEq by mouth daily., Disp: , Rfl: 5   LATUDA 40 MG TABS tablet, TAKE 1/2 TABLET DAILY WITH FOOD FOR FIRST WEEK THEN INCREASE TO 1 TABLET DAILY., Disp: , Rfl: 3   loratadine (CLARITIN) 10 MG tablet, Take 1 tablet (10 mg total) by mouth daily., Disp: 30 tablet, Rfl: 3   metFORMIN (GLUCOPHAGE) 500 MG tablet, Take 1 tablet (500 mg total) by mouth 2 (two) times daily with a meal., Disp: 60 tablet, Rfl: 3   metFORMIN (GLUCOPHAGE) 500 MG tablet, Take by mouth., Disp: , Rfl:    montelukast (SINGULAIR) 10 MG tablet, Take 10 mg by mouth daily., Disp: , Rfl: 5   montelukast (SINGULAIR) 10 MG tablet, Take by mouth., Disp: , Rfl:    NYAMYC powder, APPLY TO ABDOMINAL FOLDS TOPICALLY EVERY DAY AFTER EACH SHOWER, Disp: , Rfl: 5   omeprazole (PRILOSEC) 20 MG capsule, Take 1 capsule (20 mg total) by mouth daily., Disp: 30 capsule, Rfl: 3   omeprazole (PRILOSEC) 40 MG capsule, Take by mouth., Disp: , Rfl:    ondansetron (ZOFRAN) 8 MG tablet, Take 8 mg by mouth  3 (three) times daily as needed., Disp: , Rfl: 5   oxyCODONE-acetaminophen (PERCOCET) 7.5-325 MG tablet, Take 1 tablet by mouth 4 (four) times daily as needed., Disp: , Rfl:    potassium chloride (KLOR-CON) 10 MEQ tablet, Take 10 mEq by mouth 2 (two) times daily., Disp: , Rfl:    PROAIR HFA 108 (90 Base) MCG/ACT inhaler, USE 2 INHALATIONS FOUR TIMES A DAY AS NEEDED, Disp: , Rfl: 5   QUEtiapine (SEROQUEL) 400 MG tablet, TAKE 1 TABLET BY MOUTH EVERYDAY AT BEDTIME, Disp: , Rfl: 3   simvastatin (ZOCOR) 20 MG tablet, Take 20 mg by mouth daily., Disp: , Rfl: 5   simvastatin (ZOCOR) 20 MG tablet, Take by mouth., Disp: , Rfl:    TOVIAZ 8 MG TB24 tablet, Take 8 mg by mouth daily., Disp: , Rfl: 5   venlafaxine XR (EFFEXOR-XR) 75 MG 24 hr capsule, Take by mouth daily., Disp: , Rfl: 3   VRAYLAR 3 MG capsule, Take 3 mg by mouth daily., Disp: ,  Rfl:   Social History   Tobacco Use  Smoking Status Every Day   Packs/day: 1   Types: Cigarettes  Smokeless Tobacco Never    No Known Allergies Objective:  There were no vitals filed for this visit. There is no height or weight on file to calculate BMI. Constitutional Well developed. Well nourished.  Vascular Dorsalis pedis pulses palpable bilaterally. Posterior tibial pulses palpable bilaterally. Capillary refill normal to all digits.  No cyanosis or clubbing noted. Pedal hair growth normal.  Neurologic Normal speech. Oriented to person, place, and time. Epicritic sensation to light touch grossly present bilaterally.  Dermatologic Nails well groomed and normal in appearance. No open wounds. No skin lesions.  Orthopedic: Normal joint ROM without pain or crepitus bilaterally. No visible deformities. Tender to palpation at the calcaneal tuber bilaterally. No pain with calcaneal squeeze bilaterally. Ankle ROM diminished range of motion bilaterally. Silfverskiold Test: positive bilaterally.   Radiographs: None  Assessment:   1. Deformity of both feet   2. Plantar fasciitis, right   3. Plantar fasciitis, left    Plan:  Patient was evaluated and treated and all questions answered.  Plantar Fasciitis, bilaterally - XR reviewed as above.  - Educated on icing and stretching. Instructions given.  - Injection delivered to the plantar fascia as below. - DME: Plantar fascial brace dispensed to support the medial longitudinal arch of the foot and offload pressure from the heel and prevent arch collapse during weightbearing - Pharmacologic management: None  Pes planovalgus -I explained to patient the etiology of pes planovalgus and relationship with Planter fasciitis and various treatment options were discussed.  Given patient foot structure in the setting of Planter fasciitis I believe patient will benefit from custom-made orthotics to help control the hindfoot motion support  the arch of the foot and take the stress away from plantar fascial.  Patient agrees with the plan like to proceed with orthotics -Patient was casted for orthotics sandals   Procedure: Injection Tendon/Ligament Location: Bilateral plantar fascia at the glabrous junction; medial approach. Skin Prep: alcohol Injectate: 0.5 cc 0.5% marcaine plain, 0.5 cc of 1% Lidocaine, 0.5 cc kenalog 10. Disposition: Patient tolerated procedure well. Injection site dressed with a band-aid.  No follow-ups on file.

## 2023-01-24 ENCOUNTER — Ambulatory Visit: Payer: 59 | Admitting: Podiatry

## 2023-01-30 ENCOUNTER — Telehealth: Payer: Self-pay | Admitting: Podiatry

## 2023-01-30 NOTE — Telephone Encounter (Signed)
Pt calling to check on her orthopedics sandals to see if they are in.

## 2023-02-09 ENCOUNTER — Ambulatory Visit: Payer: 59 | Admitting: Podiatry

## 2023-02-16 ENCOUNTER — Ambulatory Visit: Payer: 59

## 2023-02-16 ENCOUNTER — Ambulatory Visit (INDEPENDENT_AMBULATORY_CARE_PROVIDER_SITE_OTHER): Payer: 59 | Admitting: Podiatry

## 2023-02-16 ENCOUNTER — Telehealth: Payer: Self-pay | Admitting: Podiatry

## 2023-02-16 DIAGNOSIS — M21619 Bunion of unspecified foot: Secondary | ICD-10-CM | POA: Diagnosis not present

## 2023-02-16 DIAGNOSIS — Z01818 Encounter for other preprocedural examination: Secondary | ICD-10-CM

## 2023-02-16 DIAGNOSIS — Z9889 Other specified postprocedural states: Secondary | ICD-10-CM

## 2023-02-16 NOTE — Progress Notes (Signed)
Subjective:  Patient ID: Andrea Sanford, female    DOB: 1971/06/14,  MRN: [id number]  Chief Complaint  Patient presents with   Plantar Fasciitis    F/U appointment    52 y.o. female presents with the above complaint.  Patient presents with complaint of right ankle pain.  OfShe states the hardware has been bothersome.  She has not had her know for quite some time.  She has not seen anyone else prior to seeing me.  She is a diabetic with last A1c of 5.8.  She also has a bunion deformity to the right side.  It is very moderate in nature she has tried some conservative care but none of which has helped she would like to surgically discuss treatment options for that.  She denies any other acute issues.   Review of Systems: Negative except as noted in the HPI. Denies N/V/F/Ch.  Past Medical History:  Diagnosis Date   Anxiety    Asthma    Cancer (HCC)    Depression    Diabetes mellitus without complication (HCC)    Hypertension     Current Outpatient Medications:    carvedilol (COREG) 25 MG tablet, Take 1 tablet (25 mg total) by mouth 2 (two) times daily with a meal., Disp: 60 tablet, Rfl: 3   Cholecalciferol (VITAMIN D3) 50000 units CAPS, TAKE ONE CAPSULE BY MOUTH ONE TIME PER WEEK, Disp: , Rfl: 3   gabapentin (NEURONTIN) 300 MG capsule, Take 1 capsule (300 mg total) by mouth 3 (three) times daily., Disp: 30 capsule, Rfl: 0   gabapentin (NEURONTIN) 600 MG tablet, Take 600 mg by mouth 3 (three) times daily., Disp: , Rfl:    glimepiride (AMARYL) 2 MG tablet, Take 1 tablet (2 mg total) by mouth daily with breakfast., Disp: 30 tablet, Rfl: 3   hydrochlorothiazide (HYDRODIURIL) 25 MG tablet, Take 1 tablet (25 mg total) by mouth daily., Disp: 30 tablet, Rfl: 3   hydrOXYzine (ATARAX/VISTARIL) 50 MG tablet, TAKE 1 TABLET BY MOUTH EVERY 6 TO 8 HOURS AS NEEDED FOR ANXIETY OR ITCHING, Disp: , Rfl: 3   hydrOXYzine (ATARAX/VISTARIL) 50 MG tablet, TAKE 1 TABLET BY MOUTH EVERY 6 TO 8 HOURS AS NEEDED  FOR ANXIETY OR ITCHING, Disp: , Rfl:    ibuprofen (ADVIL) 800 MG tablet, Take 800 mg by mouth 3 (three) times daily as needed., Disp: , Rfl:    KLOR-CON M20 20 MEQ tablet, Take 20 mEq by mouth daily., Disp: , Rfl: 5   LATUDA 40 MG TABS tablet, TAKE 1/2 TABLET DAILY WITH FOOD FOR FIRST WEEK THEN INCREASE TO 1 TABLET DAILY., Disp: , Rfl: 3   loratadine (CLARITIN) 10 MG tablet, Take 1 tablet (10 mg total) by mouth daily., Disp: 30 tablet, Rfl: 3   metFORMIN (GLUCOPHAGE) 500 MG tablet, Take 1 tablet (500 mg total) by mouth 2 (two) times daily with a meal., Disp: 60 tablet, Rfl: 3   metFORMIN (GLUCOPHAGE) 500 MG tablet, Take by mouth., Disp: , Rfl:    montelukast (SINGULAIR) 10 MG tablet, Take 10 mg by mouth daily., Disp: , Rfl: 5   montelukast (SINGULAIR) 10 MG tablet, Take by mouth., Disp: , Rfl:    NYAMYC powder, APPLY TO ABDOMINAL FOLDS TOPICALLY EVERY DAY AFTER EACH SHOWER, Disp: , Rfl: 5   omeprazole (PRILOSEC) 20 MG capsule, Take 1 capsule (20 mg total) by mouth daily., Disp: 30 capsule, Rfl: 3   omeprazole (PRILOSEC) 40 MG capsule, Take by mouth., Disp: , Rfl:  ondansetron (ZOFRAN) 8 MG tablet, Take 8 mg by mouth 3 (three) times daily as needed., Disp: , Rfl: 5   oxyCODONE-acetaminophen (PERCOCET) 7.5-325 MG tablet, Take 1 tablet by mouth 4 (four) times daily as needed., Disp: , Rfl:    potassium chloride (KLOR-CON) 10 MEQ tablet, Take 10 mEq by mouth 2 (two) times daily., Disp: , Rfl:    PROAIR HFA 108 (90 Base) MCG/ACT inhaler, USE 2 INHALATIONS FOUR TIMES A DAY AS NEEDED, Disp: , Rfl: 5   QUEtiapine (SEROQUEL) 400 MG tablet, TAKE 1 TABLET BY MOUTH EVERYDAY AT BEDTIME, Disp: , Rfl: 3   simvastatin (ZOCOR) 20 MG tablet, Take 20 mg by mouth daily., Disp: , Rfl: 5   simvastatin (ZOCOR) 20 MG tablet, Take by mouth., Disp: , Rfl:    TOVIAZ 8 MG TB24 tablet, Take 8 mg by mouth daily., Disp: , Rfl: 5   venlafaxine XR (EFFEXOR-XR) 75 MG 24 hr capsule, Take by mouth daily., Disp: , Rfl: 3    VRAYLAR 3 MG capsule, Take 3 mg by mouth daily., Disp: , Rfl:   Social History   Tobacco Use  Smoking Status Every Day   Current packs/day: 1.00   Types: Cigarettes  Smokeless Tobacco Never    No Known Allergies Objective:  There were no vitals filed for this visit. There is no height or weight on file to calculate BMI. Constitutional Well developed. Well nourished.  Vascular Dorsalis pedis pulses palpable bilaterally. Posterior tibial pulses palpable bilaterally. Capillary refill normal to all digits.  No cyanosis or clubbing noted. Pedal hair growth normal.  Neurologic Normal speech. Oriented to person, place, and time. Epicritic sensation to light touch grossly present bilaterally.  Dermatologic Nails well groomed and normal in appearance. No open wounds. No skin lesions.  Orthopedic: Pain on palpation right moderate bunion deformity.  This is a track bound not a tracking deformity.  No intra-articular first MPJ pain noted. Pain on palpation along the course of the right lateral hardware.  No pain along the medial hardware.   Radiographs: Awaiting x-ray Assessment:   1. History of removal of retained hardware   2. Bunion   3. Encounter for preoperative examination for general surgical procedure    Plan:  Patient was evaluated and treated and all questions answered.  Right moderate bunion deformity -All questions and concerns were discussed with the patient extensive detail.  She has tried shoe gear modification padding protecting offloading and has failed all conservative care therefore she will benefit from surgical correction of the bunion deformity due to her pain.  I discussed my preoperative intra postop plan with the patient in extensive detail she states understanding will proceed with right foot chevron osteotomy with a possible phalangeal osteotomy -Informed surgical risk consent was reviewed and read aloud to the patient.  I reviewed the films.  I have  discussed my findings with the patient in great detail.  I have discussed all risks including but not limited to infection, stiffness, scarring, limp, disability, deformity, damage to blood vessels and nerves, numbness, poor healing, need for braces, arthritis, chronic pain, amputation, death.  All benefits and realistic expectations discussed in great detail.  I have made no promises as to the outcome.  I have provided realistic expectations.  I have offered the patient a 2nd opinion, which they have declined and assured me they preferred to proceed despite the risks   Right lateral fibular her painful hardware -All questions and concerns were discussed.  Given that she  is having pain to the right lateral ankle due to the hardware she will benefit from removal of hardware.  I discussed my preoperative intraoperative certifying the patient extensive detail she states understand like to proceed with surgery. -Informed surgical risk consent was reviewed and read aloud to the patient.  I reviewed the films.  I have discussed my findings with the patient in great detail.  I have discussed all risks including but not limited to infection, stiffness, scarring, limp, disability, deformity, damage to blood vessels and nerves, numbness, poor healing, need for braces, arthritis, chronic pain, amputation, death.  All benefits and realistic expectations discussed in great detail.  I have made no promises as to the outcome.  I have provided realistic expectations.  I have offered the patient a 2nd opinion, which they have declined and assured me they preferred to proceed despite the risks   No follow-ups on file.   Right ankle painful hardware from history of ankle fracture   Right moderate bunion deomirt surgery

## 2023-02-16 NOTE — Telephone Encounter (Signed)
Mother called requesting a call. She was wanting to understand her daughters diagnosis.[phone]

## 2023-02-17 DIAGNOSIS — I10 Essential (primary) hypertension: Secondary | ICD-10-CM | POA: Diagnosis not present

## 2023-02-17 DIAGNOSIS — Z6831 Body mass index (BMI) 31.0-31.9, adult: Secondary | ICD-10-CM | POA: Diagnosis not present

## 2023-02-17 DIAGNOSIS — M5431 Sciatica, right side: Secondary | ICD-10-CM | POA: Diagnosis not present

## 2023-02-22 ENCOUNTER — Other Ambulatory Visit: Payer: Self-pay | Admitting: Podiatry

## 2023-02-22 DIAGNOSIS — Z9889 Other specified postprocedural states: Secondary | ICD-10-CM

## 2023-02-22 DIAGNOSIS — M21619 Bunion of unspecified foot: Secondary | ICD-10-CM

## 2023-02-23 ENCOUNTER — Ambulatory Visit: Payer: 59 | Admitting: Podiatry

## 2023-02-27 ENCOUNTER — Telehealth: Payer: Self-pay | Admitting: Podiatry

## 2023-02-27 NOTE — Telephone Encounter (Signed)
Andrea Sanford, Andrea Sanford Patient Member ID [id number]  Date of Birth 10-09-1970  Gender Female  Eligibility Status Active Coverage  Group Number [id number]  Plan / Coverage Date 2022-12-24  Transaction Type Outpatient Authorization  Organization [facility] [employer] [facility]  Payer [employer] (COMMERCIAL & MEDICARE)  Armed forces training and education officer ID: Not FoundCustomer ID: [id number] Transaction Date: NA No Authorization Required Place of Service [facility]  Service From - To Date NA  Admission Type 9  Diagnosis Code 1 [phone] - Other specified postprocedural states  Diagnosis Code 2 N02725 - Bunion of right foot  Procedure Code 1 36644  Quantity 1 Units  Procedure From - To Date 2023-03-13  Status NO AUTH REQUIRED  Message No precert required. The requested service may not be eligible for coverage . Refer to the online Clinical Policy Bulletins or the Provider Code Search Tool on [employer]. You may also contact provider services using the Precert number on the member id card. [id number]  Procedure Code 2 03474  Quantity 1 Units  Procedure From - To Date 2023-03-13  Status NO AUTH REQUIRED  Message No precert required. The requested service may not be eligible for coverage . Refer to the online Clinical Policy Bulletins or the Provider Code Search Tool on [employer]. You may also contact provider services using the Precert number on the member id card. [id number]

## 2023-02-28 ENCOUNTER — Ambulatory Visit: Payer: 59 | Admitting: Podiatry

## 2023-03-02 ENCOUNTER — Telehealth: Payer: Self-pay | Admitting: Urology

## 2023-03-02 NOTE — Telephone Encounter (Signed)
DOS - 03/13/23  [place] BUNIONECTOMY RIGHT --- 40981 REMOVAL HARDWARE RIGHT --- 20680  [employer]  [employer]   PER [employer]'S AUTOMATED SYSTEM FOR CPT CODES 19147 AND 20680 NO PRIOR AUTH IS REQUIRED.  CALL REF # [id number]   PER [employer] WEBSITE FOR CPT CODES 82956 AND 20680 HAVE BEEN APPROVED, AUTH # [id number], GOOD FROM 03/13/23 - 06/11/23.

## 2023-03-13 ENCOUNTER — Other Ambulatory Visit: Payer: Self-pay | Admitting: Podiatry

## 2023-03-13 MED ORDER — OXYCODONE-ACETAMINOPHEN 5-325 MG PO TABS
1.0000 | ORAL_TABLET | ORAL | 0 refills | Status: DC | PRN
Start: 2023-03-13 — End: 2023-06-13

## 2023-03-13 MED ORDER — IBUPROFEN 800 MG PO TABS: 800.0000 mg | ORAL_TABLET | Freq: Four times a day (QID) | ORAL | 1 refills | Status: DC | PRN

## 2023-03-21 ENCOUNTER — Encounter: Payer: 59 | Admitting: Podiatry

## 2023-04-04 ENCOUNTER — Encounter: Payer: 59 | Admitting: Podiatry

## 2023-06-13 ENCOUNTER — Ambulatory Visit
Admission: RE | Admit: 2023-06-13 | Discharge: 2023-06-13 | Disposition: A | Discharge status: Home / Self Care | Payer: Medicaid Other | Attending: Family Medicine | Admitting: Family Medicine

## 2023-06-13 ENCOUNTER — Ambulatory Visit: Admission: RE | Admit: 2023-06-13 | Payer: Medicaid Other | Source: Ambulatory Visit

## 2023-06-13 ENCOUNTER — Ambulatory Visit (INDEPENDENT_AMBULATORY_CARE_PROVIDER_SITE_OTHER): Payer: 59 | Admitting: Family Medicine

## 2023-06-13 ENCOUNTER — Encounter: Payer: Self-pay | Admitting: Family Medicine

## 2023-06-13 VITALS — BP 156/102 | HR 78 | Resp 20 | Ht 67.0 in | Wt 195.4 lb

## 2023-06-13 DIAGNOSIS — E1140 Type 2 diabetes mellitus with diabetic neuropathy, unspecified: Secondary | ICD-10-CM | POA: Diagnosis not present

## 2023-06-13 DIAGNOSIS — W19XXXA Unspecified fall, initial encounter: Secondary | ICD-10-CM

## 2023-06-13 DIAGNOSIS — I10 Essential (primary) hypertension: Secondary | ICD-10-CM | POA: Diagnosis not present

## 2023-06-13 DIAGNOSIS — Z9189 Other specified personal risk factors, not elsewhere classified: Secondary | ICD-10-CM

## 2023-06-13 DIAGNOSIS — M25511 Pain in right shoulder: Secondary | ICD-10-CM

## 2023-06-13 DIAGNOSIS — F411 Generalized anxiety disorder: Secondary | ICD-10-CM | POA: Diagnosis not present

## 2023-06-13 DIAGNOSIS — G8911 Acute pain due to trauma: Secondary | ICD-10-CM

## 2023-06-13 DIAGNOSIS — Z114 Encounter for screening for human immunodeficiency virus [HIV]: Secondary | ICD-10-CM | POA: Diagnosis not present

## 2023-06-13 DIAGNOSIS — F1410 Cocaine abuse, uncomplicated: Secondary | ICD-10-CM

## 2023-06-13 DIAGNOSIS — F3131 Bipolar disorder, current episode depressed, mild: Secondary | ICD-10-CM

## 2023-06-13 DIAGNOSIS — Z1159 Encounter for screening for other viral diseases: Secondary | ICD-10-CM | POA: Diagnosis not present

## 2023-06-13 DIAGNOSIS — F319 Bipolar disorder, unspecified: Secondary | ICD-10-CM | POA: Insufficient documentation

## 2023-06-13 DIAGNOSIS — Z7689 Persons encountering health services in other specified circumstances: Secondary | ICD-10-CM
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
## 2023-06-14 ENCOUNTER — Other Ambulatory Visit: Payer: Self-pay | Admitting: Family Medicine

## 2023-06-14 LAB — COMPREHENSIVE METABOLIC PANEL WITH GFR
ALT: 12 IU/L (ref 0–32)
AST: 22 IU/L (ref 0–40)
Albumin: 4.3 g/dL (ref 3.8–4.9)
Alkaline Phosphatase: 123 IU/L — ABNORMAL HIGH (ref 44–121)
BUN/Creatinine Ratio: 12 (ref 9–23)
BUN: 10 mg/dL (ref 6–24)
Bilirubin Total: 0.3 mg/dL (ref 0.0–1.2)
CO2: 26 mmol/L (ref 20–29)
Calcium: 9.6 mg/dL (ref 8.7–10.2)
Chloride: 102 mmol/L (ref 96–106)
Creatinine, Ser: 0.86 mg/dL (ref 0.57–1.00)
Globulin, Total: 3.6 g/dL (ref 1.5–4.5)
Glucose: 75 mg/dL (ref 70–99)
Potassium: 3.8 mmol/L (ref 3.5–5.2)
Sodium: 142 mmol/L (ref 134–144)
Total Protein: 7.9 g/dL (ref 6.0–8.5)
eGFR: 81 mL/min/1.73

## 2023-06-14 LAB — LIPID PANEL
Chol/HDL Ratio: 3.8 ratio (ref 0.0–4.4)
Cholesterol, Total: 198 mg/dL (ref 100–199)
HDL: 52 mg/dL
LDL Chol Calc (NIH): 128 mg/dL — ABNORMAL HIGH (ref 0–99)
Triglycerides: 102 mg/dL (ref 0–149)
VLDL Cholesterol Cal: 18 mg/dL (ref 5–40)

## 2023-06-14 LAB — MICROALBUMIN / CREATININE URINE RATIO
Creatinine, Urine: 232.1 mg/dL
Microalb/Creat Ratio: 19 mg/g{creat} (ref 0–29)
Microalbumin, Urine: 44.3 ug/mL

## 2023-06-14 LAB — HIV ANTIBODY (ROUTINE TESTING W REFLEX): HIV Screen 4th Generation wRfx: NONREACTIVE

## 2023-06-14 LAB — HCV INTERPRETATION

## 2023-06-14 LAB — HEMOGLOBIN A1C
Est. average glucose Bld gHb Est-mCnc: 134 mg/dL
Hgb A1c MFr Bld: 6.3 % — ABNORMAL HIGH (ref 4.8–5.6)

## 2023-06-14 LAB — HCV AB W REFLEX TO QUANT PCR: HCV Ab: NONREACTIVE

## 2023-06-14 NOTE — Telephone Encounter (Signed)
Pt states since her bp was high yesterday, she thought Dr Payton Mccallum was going to call a bp med a well. Pt states she has received no meds since her visit.  Please advise.

## 2023-06-14 NOTE — Telephone Encounter (Signed)
Medication Refill -  Most Recent Primary Care Visit:  Provider: Sherlyn Hay  Department: [facility]  Visit Type: NEW PATIENT  Date: 06/13/2023  Medication: gabapentin (NEURONTIN) 600 MG tablet [[id number]]   Has the patient contacted their pharmacy? Yes  (Agent: If yes, when and what did the pharmacy advise?) Contact PCP   Is this the correct pharmacy for this prescription? Yes If no, delete pharmacy and type the correct one.  This is the patient's preferred pharmacy:   [facility] [address] [place] - [address] [place] [address] Phone: [phone] Fax: [phone]   Has the prescription been filled recently? Yes  Is the patient out of the medication? Yes  Has the patient been seen for an appointment in the last year OR does the patient have an upcoming appointment? Yes  Can we respond through MyChart? No  Agent: Please be advised that Rx refills may take up to 3 business days. We ask that you follow-up with your pharmacy.  Pt is requesting a call back if there is an issue with the refill

## 2023-06-15 ENCOUNTER — Telehealth: Payer: Self-pay | Admitting: Family Medicine

## 2023-06-15 DIAGNOSIS — I10 Essential (primary) hypertension: Secondary | ICD-10-CM

## 2023-06-15 MED ORDER — GABAPENTIN 800 MG PO TABS: 800.0000 mg | ORAL_TABLET | Freq: Three times a day (TID) | ORAL | 0 refills | Status: DC | PRN

## 2023-06-15 MED ORDER — LOSARTAN POTASSIUM 25 MG PO TABS: 25.0000 mg | ORAL_TABLET | Freq: Every day | ORAL | 1 refills | Status: DC

## 2023-06-15 NOTE — Telephone Encounter (Signed)
Requested medication (s) are due for refill today: -  Requested medication (s) are on the active medication list: historical med  Last refill:  01/20/21  Future visit scheduled: yes  Notes to clinic:  historical provider   Requested Prescriptions  Pending Prescriptions Disp Refills   gabapentin (NEURONTIN) 600 MG tablet      Sig: Take 1 tablet (600 mg total) by mouth 3 (three) times daily.     Neurology: Anticonvulsants - gabapentin Passed - 06/14/2023 10:49 AM      Passed - Cr in normal range and within 360 days    Creatinine  Date Value Ref Range Status  10/15/2012 0.83 0.60 - 1.30 mg/dL Final   Creatinine, Ser  Date Value Ref Range Status  06/13/2023 0.86 0.57 - 1.00 mg/dL Final         Passed - Completed PHQ-2 or PHQ-9 in the last 360 days      Passed - Valid encounter within last 12 months    Recent Outpatient Visits           2 days ago Establishing care with new doctor, encounter for   [facility], Monico Blitz, DO       Future Appointments             In 2 weeks Jacky Kindle, FNP [facility], [facility]

## 2023-06-15 NOTE — Telephone Encounter (Signed)
Patient is requesting gabapentin, which has already been requested in the refill encounter on 06/14/23. Routing this to the provider due to the patient request for a BP medication.

## 2023-06-15 NOTE — Telephone Encounter (Addendum)
Medication Refill -  Most Recent Primary Care Visit:  Provider: Sherlyn Hay  Department: [facility]  Visit Type: NEW PATIENT  Date: 06/13/2023  Medication: gabapentin (NEURONTIN) 600 MG tablet [[id number]]   Blood pressure pills (states she has not been on BP medication in a while but states her BP was high when she was in office on 06/13/23 with Dr Jacquenette Shone, she has not checked BP at home and no symptoms)   *Please see refill request encounter from 06/14/2023   Has the patient contacted their pharmacy? Yes, advised to contact  PCP  Is this the correct pharmacy for this prescription? Yes, the one listed below   This is the patient's preferred pharmacy:  [facility] [address] [place] [facility] [address] [place] [address] Phone: [phone] Fax: [phone]   Has the prescription been filled recently? NA  Is the patient out of the medication? Yes  Has the patient been seen for an appointment in the last year OR does the patient have an upcoming appointment? Patient has a 2 week follow up scheduled for 06/30/2023 with Merita Norton at [facility].

## 2023-06-16 NOTE — Telephone Encounter (Signed)
Left message for patient to return call about her medications.   PEC can relay message from Dr. Payton Mccallum as below.

## 2023-06-26 ENCOUNTER — Encounter: Payer: Self-pay | Admitting: Family Medicine

## 2023-06-26 DIAGNOSIS — G8911 Acute pain due to trauma: Secondary | ICD-10-CM | POA: Insufficient documentation

## 2023-06-26 DIAGNOSIS — F1410 Cocaine abuse, uncomplicated: Secondary | ICD-10-CM | POA: Insufficient documentation

## 2023-06-26 DIAGNOSIS — Z0000 Encounter for general adult medical examination without abnormal findings: Secondary | ICD-10-CM | POA: Insufficient documentation

## 2023-06-26 DIAGNOSIS — Z7689 Persons encountering health services in other specified circumstances: Secondary | ICD-10-CM | POA: Insufficient documentation

## 2023-06-26 DIAGNOSIS — F411 Generalized anxiety disorder: Secondary | ICD-10-CM | POA: Insufficient documentation

## 2023-06-26 DIAGNOSIS — M25511 Pain in right shoulder: Secondary | ICD-10-CM | POA: Insufficient documentation

## 2023-06-26 DIAGNOSIS — W19XXXA Unspecified fall, initial encounter: Secondary | ICD-10-CM | POA: Insufficient documentation

## 2023-06-26 NOTE — Assessment & Plan Note (Addendum)
Diabetes mellitus, previously managed with metformin. Discontinued metformin approximately four months ago. Reports A1c around 6. Discussed importance of diabetes management and potential risks of uncontrolled diabetes. - Order lab work including A1c - Discuss diabetes management at follow-up  Neuropathy with numbness and tingling, particularly in the feet. Reports significant discomfort and has been off gabapentin since last year. Discussed benefits of restarting gabapentin for symptom management. - Refill gabapentin 800 mg TID

## 2023-06-26 NOTE — Assessment & Plan Note (Addendum)
Bipolar disorder, currently managed with Effexor, Vraylar, and Seroquel. Reports compliance with medication regimen. PHQ-9: 11 (06/13/2023) GAD-7: 16 (06/13/2023) - Continue current medications - Follow up with behavioral health provider

## 2023-06-26 NOTE — Assessment & Plan Note (Signed)
Elevated GAD-7 score of 16 indicating significant anxiety.  Reports intermittent crying and anxiety episodes.  - Continue current medications (Effexor, Vraylar, Seroquel) - Follow up with behavioral health provider

## 2023-06-26 NOTE — Assessment & Plan Note (Signed)
Intermittent cocaine use, last used on Saturday night. No IV drug use reported. Previous negative tests for HIV and hepatitis C. Discussed risks of continued cocaine use and potential treatment options. - Offer retesting for HIV and hepatitis C - Discuss substance use and potential treatment options at follow-up.

## 2023-06-26 NOTE — Assessment & Plan Note (Signed)
Hypertension, currently not on medication. Blood pressure elevated today. Discontinued antihypertensive medication approximately one year ago. Discussed risks of untreated hypertension and potential need for medication. - Order lab work - Discuss potential need for antihypertensive medication at follow-up

## 2023-06-26 NOTE — Assessment & Plan Note (Addendum)
No recent screenings or preventive care noted. Discussed importance of regular follow-up and preventive care. - Order lab work - Request records from previous primary care provider - Dr. Doran Heater.  She also reports seeing Dr. Lynett Fish at [facility]. - Schedule follow-up in two weeks
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
## 2023-06-30 ENCOUNTER — Ambulatory Visit
Admission: RE | Admit: 2023-06-30 | Discharge: 2023-06-30 | Disposition: A | Discharge status: Home / Self Care | Payer: Medicaid Other | Source: Ambulatory Visit | Attending: Family Medicine | Admitting: Family Medicine

## 2023-06-30 ENCOUNTER — Ambulatory Visit: Payer: 59 | Admitting: Family Medicine

## 2023-06-30 ENCOUNTER — Ambulatory Visit
Admission: RE | Admit: 2023-06-30 | Discharge: 2023-06-30 | Disposition: A | Discharge status: Home / Self Care | Payer: Medicaid Other | Attending: Family Medicine | Admitting: Family Medicine

## 2023-06-30 DIAGNOSIS — W19XXXA Unspecified fall, initial encounter: Secondary | ICD-10-CM | POA: Insufficient documentation

## 2023-06-30 DIAGNOSIS — M25511 Pain in right shoulder: Secondary | ICD-10-CM | POA: Diagnosis not present

## 2023-06-30 DIAGNOSIS — G8911 Acute pain due to trauma: Secondary | ICD-10-CM | POA: Insufficient documentation

## 2023-07-03 ENCOUNTER — Ambulatory Visit: Payer: Self-pay | Admitting: *Deleted

## 2023-07-03 DIAGNOSIS — S42024A Nondisplaced fracture of shaft of right clavicle, initial encounter for closed fracture: Secondary | ICD-10-CM

## 2023-07-03 NOTE — Telephone Encounter (Signed)
  Chief Complaint: Calling in for her imaging reports on pelvis and shoulder.   Not been interpreted. Symptoms: N/A Frequency: N/A Pertinent Negatives: Patient denies N/A Disposition: [] ED /[] Urgent Care (no appt availability in office) / [] Appointment(In office/virtual)/ []  [facility]/ [] Home Care/ [] Refused Recommended Disposition /[] [facility]/ [x]  Follow-up with PCP Additional Notes: Message sent that pt has called in inquiring about imaging results.   Not read yet by radiologist.

## 2023-07-03 NOTE — Telephone Encounter (Signed)
I called patient and notified her. She verbalized understanding.

## 2023-07-03 NOTE — Telephone Encounter (Signed)
Shoulder Xray showed Acute fracture of the mid shaft of the right clavicle.  Pelvic xray has not been read yet.   Urgent referral sent to orthopedics for evaluation   Recommend a sling until she is able to be evaluated by specialist

## 2023-07-03 NOTE — Addendum Note (Signed)
Addended by: Bing Neighbors on: 07/03/2023 03:53 PM   Modules accepted: Orders

## 2023-07-03 NOTE — Telephone Encounter (Signed)
Reason for Disposition  [1] Caller requesting NON-URGENT health information AND [2] PCP's office is the best resource  Answer Assessment - Initial Assessment Questions 1. REASON FOR CALL or QUESTION: "What is your reason for calling today?" or "How can I best help you?" or "What question do you have that I can help answer?"     Calling in for MRI results of her shoulder.  Protocols used: Information Only Call - No Triage-A-AH

## 2023-07-07 ENCOUNTER — Ambulatory Visit (INDEPENDENT_AMBULATORY_CARE_PROVIDER_SITE_OTHER): Payer: 59 | Admitting: Family Medicine

## 2023-07-07 DIAGNOSIS — Z91199 Patient's noncompliance with other medical treatment and regimen due to unspecified reason: Secondary | ICD-10-CM

## 2023-07-07 NOTE — Progress Notes (Signed)
Patient was not seen for appt d/t no call, no show, or late arrival >10 mins past appt time.   Elise T Payne, FNP  [facility] [address] [place], [place] [address] [phone] (phone) [phone] (fax) [facility]  

## 2023-07-14 ENCOUNTER — Other Ambulatory Visit: Payer: Self-pay | Admitting: Family Medicine

## 2023-07-14 DIAGNOSIS — I10 Essential (primary) hypertension: Secondary | ICD-10-CM

## 2023-07-18 ENCOUNTER — Other Ambulatory Visit: Payer: Self-pay | Admitting: Family Medicine

## 2023-07-18 DIAGNOSIS — E1140 Type 2 diabetes mellitus with diabetic neuropathy, unspecified: Secondary | ICD-10-CM

## 2023-07-18 MED ORDER — GABAPENTIN 800 MG PO TABS: 800.0000 mg | ORAL_TABLET | Freq: Three times a day (TID) | ORAL | 0 refills | Status: DC | PRN

## 2023-07-18 NOTE — Telephone Encounter (Signed)
Requested Prescriptions  Pending Prescriptions Disp Refills   gabapentin (NEURONTIN) 800 MG tablet 90 tablet 0    Sig: Take 1 tablet (800 mg total) by mouth 3 (three) times daily as needed.     Neurology: Anticonvulsants - gabapentin Passed - 07/18/2023  4:15 PM      Passed - Cr in normal range and within 360 days    Creatinine  Date Value Ref Range Status  10/15/2012 0.83 0.60 - 1.30 mg/dL Final   Creatinine, Ser  Date Value Ref Range Status  06/13/2023 0.86 0.57 - 1.00 mg/dL Final         Passed - Completed PHQ-2 or PHQ-9 in the last 360 days      Passed - Valid encounter within last 12 months    Recent Outpatient Visits           1 week ago No-show for appointment   [facility] Jacky Kindle, FNP   1 month ago Fall, initial encounter   [facility] Pardue, Monico Blitz, DO       Future Appointments             In 6 days Sallee Provencal, FNP [facility]

## 2023-07-18 NOTE — Telephone Encounter (Signed)
Medication Refill -  Most Recent Primary Care Visit:  Provider: Sherlyn Hay  Department: [facility]  Visit Type: NEW PATIENT  Date: 06/13/2023  Medication:  gabapentin (NEURONTIN) 800 MG tablet   Has the patient contacted their pharmacy? No  Is this the correct pharmacy for this prescription? Yes  This is the patient's preferred pharmacy:  [facility] [address] [place] - [address] [place] [address] Phone: [phone] Fax: [phone]   Has the prescription been filled recently? Yes  Is the patient out of the medication? Yes  Has the patient been seen for an appointment in the last year OR does the patient have an upcoming appointment? Yes  Can we respond through MyChart? No  Agent: Please be advised that Rx refills may take up to 3 business days. We ask that you follow-up with your pharmacy.

## 2023-07-24 ENCOUNTER — Ambulatory Visit (INDEPENDENT_AMBULATORY_CARE_PROVIDER_SITE_OTHER): Payer: Medicaid Other | Admitting: Family Medicine

## 2023-07-24 ENCOUNTER — Encounter: Payer: Self-pay | Admitting: Family Medicine

## 2023-07-24 VITALS — BP 140/96 | HR 94 | Temp 97.6°F | Ht 67.0 in | Wt 193.0 lb

## 2023-07-24 DIAGNOSIS — I10 Essential (primary) hypertension: Secondary | ICD-10-CM | POA: Diagnosis not present

## 2023-07-24 DIAGNOSIS — S42001G Fracture of unspecified part of right clavicle, subsequent encounter for fracture with delayed healing: Secondary | ICD-10-CM | POA: Diagnosis not present

## 2023-07-24 DIAGNOSIS — T7491XA Unspecified adult maltreatment, confirmed, initial encounter: Secondary | ICD-10-CM

## 2023-07-24 MED ORDER — LOSARTAN POTASSIUM 25 MG PO TABS: 25.0000 mg | ORAL_TABLET | Freq: Every day | ORAL | 1 refills | Status: DC

## 2023-07-24 MED ORDER — NAPROXEN 500 MG PO TABS: 500.0000 mg | ORAL_TABLET | Freq: Two times a day (BID) | ORAL | 0 refills | Status: DC

## 2023-07-24 NOTE — Assessment & Plan Note (Signed)
R clavicle fracture- pt did not receive information in regards to urgent orthopedic referral placed by Dr. Roxan Hockey on 07/03/23.  - Referral placed again urgently to [facility] - discussed pt may also walk in today.  - Pulse +2 R radial and brachial - No signs of decreased perfusion, sensation, pallor, or weakness to arm.  - decrease ROM due to pain, pt favoring holding arm.  - Naproxen 500mg  BID  - Rest, heat, ice - Pt agreeable to driving to [facility] after this visit.

## 2023-07-24 NOTE — Assessment & Plan Note (Signed)
Chronically elevated.  - Restarted Losartan 25 mg daily po - f/u in 4 weeks for BP check with PCP - CMP up to date as of 06/13/23 - Decrease tobacco use - DASH Diet - increase daily walking - monitor at home with BP cuff upper arm automatic.
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
## 2023-08-06 ENCOUNTER — Other Ambulatory Visit: Payer: Self-pay | Admitting: Family Medicine

## 2023-08-06 DIAGNOSIS — E1140 Type 2 diabetes mellitus with diabetic neuropathy, unspecified: Secondary | ICD-10-CM

## 2023-08-13 ENCOUNTER — Other Ambulatory Visit: Payer: Self-pay | Admitting: Family Medicine

## 2023-08-13 DIAGNOSIS — E1140 Type 2 diabetes mellitus with diabetic neuropathy, unspecified: Secondary | ICD-10-CM

## 2023-08-14 ENCOUNTER — Ambulatory Visit: Payer: 59 | Admitting: Family Medicine

## 2023-08-14 ENCOUNTER — Other Ambulatory Visit: Payer: Self-pay | Admitting: Family Medicine

## 2023-08-14 DIAGNOSIS — E1140 Type 2 diabetes mellitus with diabetic neuropathy, unspecified: Secondary | ICD-10-CM

## 2023-08-14 NOTE — Telephone Encounter (Signed)
Medication Refill -  Most Recent Primary Care Visit:  Provider: Charlcie Cradle A  Department: [facility]  Visit Type: OFFICE VISIT  Date: 07/24/2023  Medication: gabapentin (NEURONTIN) 800 MG tablet  Has the patient contacted their pharmacy? Yes  Is this the correct pharmacy for this prescription? Yes If no, delete pharmacy and type the correct one.  This is the patient's preferred pharmacy:  [facility] [address] [place] - [address] [place] [address] Phone: [phone] Fax: [phone]   Has the prescription been filled recently? Yes  Is the patient out of the medication? No  Has the patient been seen for an appointment in the last year OR does the patient have an upcoming appointment? Yes  Can we respond through MyChart? No  Agent: Please be advised that Rx refills may take up to 3 business days. We ask that you follow-up with your pharmacy.

## 2023-08-16 ENCOUNTER — Other Ambulatory Visit: Payer: Self-pay | Admitting: Family Medicine

## 2023-08-16 DIAGNOSIS — F319 Bipolar disorder, unspecified: Secondary | ICD-10-CM | POA: Diagnosis not present

## 2023-08-16 DIAGNOSIS — I10 Essential (primary) hypertension: Secondary | ICD-10-CM

## 2023-08-16 DIAGNOSIS — F419 Anxiety disorder, unspecified: Secondary | ICD-10-CM | POA: Diagnosis not present

## 2023-08-17 ENCOUNTER — Emergency Department: Payer: Medicaid Other

## 2023-08-17 ENCOUNTER — Inpatient Hospital Stay
Admit: 2023-08-17 | Discharge: 2023-08-17 | Disposition: A | Discharge status: Home / Self Care | Payer: Medicaid Other | Attending: Pulmonary Disease | Admitting: Pulmonary Disease

## 2023-08-17 ENCOUNTER — Inpatient Hospital Stay
Admission: EM | Admit: 2023-08-17 | Discharge: 2023-09-15 | DRG: 004 | Discharge status: Against Medical Advice | Payer: Medicaid Other | Attending: Internal Medicine | Admitting: Internal Medicine

## 2023-08-17 DIAGNOSIS — T50904A Poisoning by unspecified drugs, medicaments and biological substances, undetermined, initial encounter: Secondary | ICD-10-CM | POA: Diagnosis present

## 2023-08-17 DIAGNOSIS — N179 Acute kidney failure, unspecified: Secondary | ICD-10-CM | POA: Diagnosis not present

## 2023-08-17 DIAGNOSIS — F411 Generalized anxiety disorder: Secondary | ICD-10-CM | POA: Diagnosis present

## 2023-08-17 DIAGNOSIS — R4182 Altered mental status, unspecified: Principal | ICD-10-CM

## 2023-08-17 DIAGNOSIS — Z1152 Encounter for screening for COVID-19: Secondary | ICD-10-CM | POA: Diagnosis not present

## 2023-08-17 DIAGNOSIS — I959 Hypotension, unspecified: Secondary | ICD-10-CM | POA: Diagnosis not present

## 2023-08-17 DIAGNOSIS — J690 Pneumonitis due to inhalation of food and vomit: Secondary | ICD-10-CM | POA: Diagnosis not present

## 2023-08-17 DIAGNOSIS — E43 Unspecified severe protein-calorie malnutrition: Secondary | ICD-10-CM | POA: Insufficient documentation

## 2023-08-17 DIAGNOSIS — Z931 Gastrostomy status: Secondary | ICD-10-CM | POA: Diagnosis not present

## 2023-08-17 DIAGNOSIS — G9341 Metabolic encephalopathy: Secondary | ICD-10-CM | POA: Diagnosis not present

## 2023-08-17 DIAGNOSIS — J9601 Acute respiratory failure with hypoxia: Secondary | ICD-10-CM

## 2023-08-17 DIAGNOSIS — F1290 Cannabis use, unspecified, uncomplicated: Secondary | ICD-10-CM | POA: Diagnosis not present

## 2023-08-17 DIAGNOSIS — I10 Essential (primary) hypertension: Secondary | ICD-10-CM | POA: Diagnosis not present

## 2023-08-17 DIAGNOSIS — Z833 Family history of diabetes mellitus: Secondary | ICD-10-CM

## 2023-08-17 DIAGNOSIS — E781 Pure hyperglyceridemia: Secondary | ICD-10-CM | POA: Diagnosis not present

## 2023-08-17 DIAGNOSIS — R680 Hypothermia, not associated with low environmental temperature: Secondary | ICD-10-CM | POA: Diagnosis present

## 2023-08-17 DIAGNOSIS — S42021A Displaced fracture of shaft of right clavicle, initial encounter for closed fracture: Secondary | ICD-10-CM | POA: Diagnosis present

## 2023-08-17 DIAGNOSIS — Z930 Tracheostomy status: Secondary | ICD-10-CM | POA: Diagnosis not present

## 2023-08-17 DIAGNOSIS — Y906 Blood alcohol level of 120-199 mg/100 ml: Secondary | ICD-10-CM | POA: Diagnosis present

## 2023-08-17 DIAGNOSIS — R0902 Hypoxemia: Secondary | ICD-10-CM | POA: Diagnosis not present

## 2023-08-17 DIAGNOSIS — R531 Weakness: Secondary | ICD-10-CM | POA: Diagnosis not present

## 2023-08-17 DIAGNOSIS — F10920 Alcohol use, unspecified with intoxication, uncomplicated: Secondary | ICD-10-CM | POA: Diagnosis not present

## 2023-08-17 DIAGNOSIS — R55 Syncope and collapse: Secondary | ICD-10-CM | POA: Diagnosis not present

## 2023-08-17 DIAGNOSIS — E871 Hypo-osmolality and hyponatremia: Secondary | ICD-10-CM | POA: Diagnosis present

## 2023-08-17 DIAGNOSIS — Z818 Family history of other mental and behavioral disorders: Secondary | ICD-10-CM

## 2023-08-17 DIAGNOSIS — G928 Other toxic encephalopathy: Secondary | ICD-10-CM | POA: Diagnosis present

## 2023-08-17 DIAGNOSIS — R6521 Severe sepsis with septic shock: Secondary | ICD-10-CM | POA: Diagnosis not present

## 2023-08-17 DIAGNOSIS — J9602 Acute respiratory failure with hypercapnia: Secondary | ICD-10-CM | POA: Diagnosis present

## 2023-08-17 DIAGNOSIS — F10220 Alcohol dependence with intoxication, uncomplicated: Secondary | ICD-10-CM | POA: Diagnosis present

## 2023-08-17 DIAGNOSIS — E876 Hypokalemia: Secondary | ICD-10-CM | POA: Diagnosis present

## 2023-08-17 DIAGNOSIS — A419 Sepsis, unspecified organism: Secondary | ICD-10-CM | POA: Diagnosis not present

## 2023-08-17 DIAGNOSIS — F1410 Cocaine abuse, uncomplicated: Secondary | ICD-10-CM | POA: Diagnosis present

## 2023-08-17 DIAGNOSIS — R230 Cyanosis: Secondary | ICD-10-CM | POA: Diagnosis present

## 2023-08-17 DIAGNOSIS — F10231 Alcohol dependence with withdrawal delirium: Secondary | ICD-10-CM | POA: Diagnosis not present

## 2023-08-17 DIAGNOSIS — J9600 Acute respiratory failure, unspecified whether with hypoxia or hypercapnia: Secondary | ICD-10-CM | POA: Diagnosis not present

## 2023-08-17 DIAGNOSIS — R0689 Other abnormalities of breathing: Secondary | ICD-10-CM | POA: Diagnosis not present

## 2023-08-17 DIAGNOSIS — M810 Age-related osteoporosis without current pathological fracture: Secondary | ICD-10-CM | POA: Diagnosis present

## 2023-08-17 DIAGNOSIS — Z136 Encounter for screening for cardiovascular disorders: Secondary | ICD-10-CM | POA: Diagnosis not present

## 2023-08-17 DIAGNOSIS — T17590A Other foreign object in bronchus causing asphyxiation, initial encounter: Secondary | ICD-10-CM | POA: Diagnosis not present

## 2023-08-17 DIAGNOSIS — L8990 Pressure ulcer of unspecified site, unspecified stage: Secondary | ICD-10-CM | POA: Insufficient documentation

## 2023-08-17 DIAGNOSIS — J15211 Pneumonia due to Methicillin susceptible Staphylococcus aureus: Secondary | ICD-10-CM | POA: Diagnosis not present

## 2023-08-17 DIAGNOSIS — R404 Transient alteration of awareness: Secondary | ICD-10-CM | POA: Diagnosis not present

## 2023-08-17 DIAGNOSIS — R9431 Abnormal electrocardiogram [ECG] [EKG]: Secondary | ICD-10-CM | POA: Diagnosis present

## 2023-08-17 DIAGNOSIS — F1490 Cocaine use, unspecified, uncomplicated: Secondary | ICD-10-CM | POA: Diagnosis not present

## 2023-08-17 DIAGNOSIS — E872 Acidosis, unspecified: Secondary | ICD-10-CM | POA: Diagnosis present

## 2023-08-17 DIAGNOSIS — T68XXXA Hypothermia, initial encounter: Secondary | ICD-10-CM

## 2023-08-17 DIAGNOSIS — R000 Tachycardia, unspecified: Secondary | ICD-10-CM | POA: Diagnosis not present

## 2023-08-17 DIAGNOSIS — E119 Type 2 diabetes mellitus without complications: Secondary | ICD-10-CM | POA: Diagnosis present

## 2023-08-17 DIAGNOSIS — Z5329 Procedure and treatment not carried out because of patient's decision for other reasons: Secondary | ICD-10-CM | POA: Diagnosis not present

## 2023-08-17 DIAGNOSIS — Y95 Nosocomial condition: Secondary | ICD-10-CM | POA: Insufficient documentation

## 2023-08-17 DIAGNOSIS — J440 Chronic obstructive pulmonary disease with acute lower respiratory infection: Secondary | ICD-10-CM | POA: Diagnosis not present

## 2023-08-17 DIAGNOSIS — Z79899 Other long term (current) drug therapy: Secondary | ICD-10-CM

## 2023-08-17 DIAGNOSIS — E669 Obesity, unspecified: Secondary | ICD-10-CM | POA: Diagnosis present

## 2023-08-17 DIAGNOSIS — L89151 Pressure ulcer of sacral region, stage 1: Secondary | ICD-10-CM | POA: Diagnosis not present

## 2023-08-17 DIAGNOSIS — F319 Bipolar disorder, unspecified: Secondary | ICD-10-CM | POA: Diagnosis present

## 2023-08-17 DIAGNOSIS — Z6832 Body mass index (BMI) 32.0-32.9, adult: Secondary | ICD-10-CM

## 2023-08-17 DIAGNOSIS — I429 Cardiomyopathy, unspecified: Secondary | ICD-10-CM | POA: Diagnosis not present

## 2023-08-17 DIAGNOSIS — T405X4A Poisoning by cocaine, undetermined, initial encounter: Principal | ICD-10-CM | POA: Diagnosis present

## 2023-08-17 DIAGNOSIS — Z5900 Homelessness unspecified: Secondary | ICD-10-CM | POA: Diagnosis not present

## 2023-08-17 DIAGNOSIS — F17210 Nicotine dependence, cigarettes, uncomplicated: Secondary | ICD-10-CM | POA: Diagnosis present

## 2023-08-17 DIAGNOSIS — G4733 Obstructive sleep apnea (adult) (pediatric): Secondary | ICD-10-CM | POA: Diagnosis present

## 2023-08-17 DIAGNOSIS — G929 Unspecified toxic encephalopathy: Secondary | ICD-10-CM | POA: Diagnosis not present

## 2023-08-17 DIAGNOSIS — T17500A Unspecified foreign body in bronchus causing asphyxiation, initial encounter: Secondary | ICD-10-CM | POA: Diagnosis not present

## 2023-08-17 DIAGNOSIS — R1319 Other dysphagia: Secondary | ICD-10-CM | POA: Diagnosis not present

## 2023-08-17 DIAGNOSIS — J189 Pneumonia, unspecified organism: Secondary | ICD-10-CM

## 2023-08-17 LAB — URINE DRUG SCREEN, QUALITATIVE (ARMC ONLY)
Amphetamines, Ur Screen: POSITIVE — AB
Barbiturates, Ur Screen: NOT DETECTED
Benzodiazepine, Ur Scrn: NOT DETECTED
Cannabinoid 50 Ng, Ur ~~LOC~~: POSITIVE — AB
Cocaine Metabolite,Ur ~~LOC~~: POSITIVE — AB
MDMA (Ecstasy)Ur Screen: NOT DETECTED
Methadone Scn, Ur: NOT DETECTED
Opiate, Ur Screen: NOT DETECTED
Phencyclidine (PCP) Ur S: NOT DETECTED
Tricyclic, Ur Screen: NOT DETECTED

## 2023-08-17 LAB — POTASSIUM: Potassium: 3.6 mmol/L (ref 3.5–5.1)

## 2023-08-17 LAB — BLOOD GAS, ARTERIAL
Acid-base deficit: 4.7 mmol/L — ABNORMAL HIGH (ref 0.0–2.0)
Bicarbonate: 22.5 mmol/L (ref 20.0–28.0)
FIO2: 50 %
MECHVT: 470 mL
Mechanical Rate: 18
O2 Saturation: 99.8 %
PEEP: 8 cmH2O
Patient temperature: 37
pCO2 arterial: 49 mm[Hg] — ABNORMAL HIGH (ref 32–48)
pH, Arterial: 7.27 — ABNORMAL LOW (ref 7.35–7.45)
pO2, Arterial: 173 mm[Hg] — ABNORMAL HIGH (ref 83–108)

## 2023-08-17 LAB — CREATININE, SERUM
Creatinine, Ser: 0.84 mg/dL (ref 0.44–1.00)
GFR, Estimated: >60 mL/min (ref >60)

## 2023-08-17 LAB — COMPREHENSIVE METABOLIC PANEL
ALT: UNDETERMINED U/L (ref 0–44)
AST: 19 U/L (ref 15–41)
Albumin: 3.7 g/dL (ref 3.5–5.0)
Alkaline Phosphatase: 81 U/L (ref 38–126)
Anion gap: 11 (ref 5–15)
BUN: UNDETERMINED mg/dL (ref 6–20)
CO2: 20 mmol/L — ABNORMAL LOW (ref 22–32)
Calcium: 8.4 mg/dL — ABNORMAL LOW (ref 8.9–10.3)
Chloride: 107 mmol/L (ref 98–111)
Creatinine, Ser: UNDETERMINED mg/dL (ref 0.44–1.00)
Glucose, Bld: 148 mg/dL — ABNORMAL HIGH (ref 70–99)
Potassium: 3.1 mmol/L — ABNORMAL LOW (ref 3.5–5.1)
Sodium: 138 mmol/L (ref 135–145)
Total Bilirubin: 0.5 mg/dL (ref 0.0–1.2)
Total Protein: 7.2 g/dL (ref 6.5–8.1)

## 2023-08-17 LAB — CBC
HCT: 41.5 % (ref 36.0–46.0)
Hemoglobin: 13.6 g/dL (ref 12.0–15.0)
MCH: 29.8 pg (ref 26.0–34.0)
MCHC: 32.8 g/dL (ref 30.0–36.0)
MCV: 90.8 fL (ref 80.0–100.0)
Platelets: 242 10*3/uL (ref 150–400)
RBC: 4.57 MIL/uL (ref 3.87–5.11)
RDW: 12.2 % (ref 11.5–15.5)
WBC: 9.8 10*3/uL (ref 4.0–10.5)
nRBC: 0 % (ref 0.0–0.2)

## 2023-08-17 LAB — URINALYSIS, ROUTINE W REFLEX MICROSCOPIC
Bilirubin Urine: NEGATIVE
Glucose, UA: NEGATIVE mg/dL
Hgb urine dipstick: NEGATIVE
Ketones, ur: NEGATIVE mg/dL
Leukocytes,Ua: NEGATIVE
Nitrite: NEGATIVE
Protein, ur: NEGATIVE mg/dL
Specific Gravity, Urine: 1.003 — ABNORMAL LOW (ref 1.005–1.030)
pH: 6 (ref 5.0–8.0)

## 2023-08-17 LAB — GLUCOSE, CAPILLARY
Glucose-Capillary: 103 mg/dL — ABNORMAL HIGH (ref 70–99)
Glucose-Capillary: 108 mg/dL — ABNORMAL HIGH (ref 70–99)
Glucose-Capillary: 126 mg/dL — ABNORMAL HIGH (ref 70–99)
Glucose-Capillary: 129 mg/dL — ABNORMAL HIGH (ref 70–99)
Glucose-Capillary: 158 mg/dL — ABNORMAL HIGH (ref 70–99)
Glucose-Capillary: 164 mg/dL — ABNORMAL HIGH (ref 70–99)

## 2023-08-17 LAB — ECHOCARDIOGRAM COMPLETE
AR max vel: 3.59 cm2
AV Area VTI: 3.98 cm2
AV Area mean vel: 3.54 cm2
AV Mean grad: 3 mm[Hg]
AV Peak grad: 4.8 mm[Hg]
Ao pk vel: 1.09 m/s
Area-P 1/2: 2.5 cm2
MV VTI: 2.37 cm2
S' Lateral: 2.1 cm
Weight: 3065.28 [oz_av]

## 2023-08-17 LAB — CBG MONITORING, ED: Glucose-Capillary: 139 mg/dL — ABNORMAL HIGH (ref 70–99)

## 2023-08-17 LAB — LACTIC ACID, PLASMA
Lactic Acid, Venous: 0.9 mmol/L (ref 0.5–1.9)
Lactic Acid, Venous: <0.3 mmol/L — ABNORMAL LOW (ref 0.5–1.9)

## 2023-08-17 LAB — PREGNANCY, URINE: Preg Test, Ur: NEGATIVE

## 2023-08-17 LAB — SALICYLATE LEVEL: Salicylate Lvl: <7 mg/dL — ABNORMAL LOW (ref 7.0–30.0)

## 2023-08-17 LAB — LIPASE, BLOOD: Lipase: 30 U/L (ref 11–51)

## 2023-08-17 LAB — TROPONIN I (HIGH SENSITIVITY)
Troponin I (High Sensitivity): 6 ng/L (ref <18)
Troponin I (High Sensitivity): 8 ng/L (ref <18)

## 2023-08-17 LAB — MAGNESIUM: Magnesium: 2.3 mg/dL (ref 1.7–2.4)

## 2023-08-17 LAB — PROCALCITONIN: Procalcitonin: <0.1 ng/mL

## 2023-08-17 LAB — MRSA NEXT GEN BY PCR, NASAL: MRSA by PCR Next Gen: NOT DETECTED

## 2023-08-17 LAB — RESP PANEL BY RT-PCR (RSV, FLU A&B, COVID)  RVPGX2
Influenza A by PCR: NEGATIVE
Influenza B by PCR: NEGATIVE
Resp Syncytial Virus by PCR: NEGATIVE
SARS Coronavirus 2 by RT PCR: NEGATIVE

## 2023-08-17 LAB — ACETAMINOPHEN LEVEL: Acetaminophen (Tylenol), Serum: <10 ug/mL — ABNORMAL LOW (ref 10–30)

## 2023-08-17 LAB — PHOSPHORUS: Phosphorus: 4.4 mg/dL (ref 2.5–4.6)

## 2023-08-17 LAB — ALT: ALT: 11 U/L (ref 0–44)

## 2023-08-17 LAB — ETHANOL: Alcohol, Ethyl (B): 190 mg/dL — ABNORMAL HIGH (ref <10)

## 2023-08-17 LAB — BUN: BUN: 11 mg/dL (ref 6–20)

## 2023-08-17 LAB — BRAIN NATRIURETIC PEPTIDE: B Natriuretic Peptide: 33.4 pg/mL (ref 0.0–100.0)

## 2023-08-17 MED ORDER — THIAMINE HCL 100 MG/ML IJ SOLN
500.0000 mg | INTRAVENOUS | Status: DC
  Administered 2023-08-17: 500 mg via INTRAVENOUS
  Filled 2023-08-17: qty 5

## 2023-08-17 MED ORDER — SODIUM CHLORIDE 0.9 % IV BOLUS
1000.0000 mL | Freq: Once | INTRAVENOUS | Status: AC
  Administered 2023-08-17: 1000 mL via INTRAVENOUS

## 2023-08-17 MED ORDER — DOCUSATE SODIUM 50 MG/5ML PO LIQD
100.0000 mg | Freq: Two times a day (BID) | ORAL | Status: DC | PRN
  Filled 2023-08-17: qty 10

## 2023-08-17 MED ORDER — FAMOTIDINE 20 MG PO TABS
20.0000 mg | ORAL_TABLET | Freq: Two times a day (BID) | ORAL | Status: DC
  Administered 2023-08-17 – 2023-08-18 (×3): 20 mg
  Filled 2023-08-17 (×3): qty 1

## 2023-08-17 MED ORDER — ENOXAPARIN SODIUM 40 MG/0.4ML IJ SOSY
40.0000 mg | PREFILLED_SYRINGE | INTRAMUSCULAR | Status: DC
  Administered 2023-08-17 – 2023-08-28 (×12): 40 mg via SUBCUTANEOUS
  Filled 2023-08-17 (×12): qty 0.4

## 2023-08-17 MED ORDER — CHLORHEXIDINE GLUCONATE CLOTH 2 % EX PADS
6.0000 | MEDICATED_PAD | Freq: Every day | CUTANEOUS | Status: DC
  Administered 2023-08-18 – 2023-09-15 (×29): 6 via TOPICAL

## 2023-08-17 MED ORDER — POLYETHYLENE GLYCOL 3350 17 G PO PACK
17.0000 g | PACK | Freq: Every day | ORAL | Status: DC
  Administered 2023-08-17 – 2023-08-18 (×2): 17 g
  Filled 2023-08-17 (×2): qty 1

## 2023-08-17 MED ORDER — LACTATED RINGERS IV BOLUS
500.0000 mL | Freq: Once | INTRAVENOUS | Status: AC
  Administered 2023-08-17: 500 mL via INTRAVENOUS

## 2023-08-17 MED ORDER — ETOMIDATE 2 MG/ML IV SOLN
20.0000 mg | Freq: Once | INTRAVENOUS | Status: AC
  Administered 2023-08-17: 20 mg via INTRAVENOUS

## 2023-08-17 MED ORDER — LACTATED RINGERS IV BOLUS: 1000.0000 mL | Freq: Once | INTRAVENOUS | Status: DC

## 2023-08-17 MED ORDER — NOREPINEPHRINE 4 MG/250ML-% IV SOLN
0.0000 ug/min | INTRAVENOUS | Status: DC
  Administered 2023-08-17: 2 ug/min via INTRAVENOUS

## 2023-08-17 MED ORDER — ORAL CARE MOUTH RINSE: 15.0000 mL | OROMUCOSAL | Status: DC | PRN

## 2023-08-17 MED ORDER — POLYETHYLENE GLYCOL 3350 17 G PO PACK: 17.0000 g | PACK | Freq: Every day | ORAL | Status: DC | PRN

## 2023-08-17 MED ORDER — IPRATROPIUM-ALBUTEROL 0.5-2.5 (3) MG/3ML IN SOLN
3.0000 mL | RESPIRATORY_TRACT | Status: DC | PRN
  Administered 2023-08-18 – 2023-08-23 (×2): 3 mL via RESPIRATORY_TRACT
  Filled 2023-08-17: qty 3

## 2023-08-17 MED ORDER — POTASSIUM CHLORIDE 10 MEQ/100ML IV SOLN: 10.0000 meq | Freq: Once | INTRAVENOUS | Status: DC

## 2023-08-17 MED ORDER — NOREPINEPHRINE 4 MG/250ML-% IV SOLN
INTRAVENOUS | Status: AC
  Filled 2023-08-17: qty 250

## 2023-08-17 MED ORDER — MIDAZOLAM HCL 2 MG/2ML IJ SOLN
1.0000 mg | INTRAMUSCULAR | Status: DC | PRN
  Administered 2023-08-17 – 2023-08-18 (×7): 2 mg via INTRAVENOUS
  Filled 2023-08-17 (×7): qty 2

## 2023-08-17 MED ORDER — ADULT MULTIVITAMIN W/MINERALS CH
1.0000 | ORAL_TABLET | Freq: Every day | ORAL | Status: DC
  Administered 2023-08-17 – 2023-08-23 (×7): 1
  Filled 2023-08-17 (×7): qty 1

## 2023-08-17 MED ORDER — ACETAMINOPHEN 325 MG PO TABS
650.0000 mg | ORAL_TABLET | ORAL | Status: DC | PRN
  Administered 2023-08-19 – 2023-09-05 (×8): 650 mg
  Filled 2023-08-17 (×8): qty 2

## 2023-08-17 MED ORDER — FENTANYL CITRATE PF 50 MCG/ML IJ SOSY
50.0000 ug | PREFILLED_SYRINGE | INTRAMUSCULAR | Status: DC | PRN
  Administered 2023-08-18: 50 ug via INTRAVENOUS
  Filled 2023-08-17: qty 1

## 2023-08-17 MED ORDER — SUCCINYLCHOLINE CHLORIDE 200 MG/10ML IV SOSY
200.0000 mg | PREFILLED_SYRINGE | Freq: Once | INTRAVENOUS | Status: AC
  Administered 2023-08-17: 200 mg via INTRAVENOUS

## 2023-08-17 MED ORDER — FOLIC ACID 1 MG PO TABS
1.0000 mg | ORAL_TABLET | Freq: Every day | ORAL | Status: DC
  Administered 2023-08-17 – 2023-09-15 (×29): 1 mg
  Filled 2023-08-17 (×29): qty 1

## 2023-08-17 MED ORDER — HEPARIN SODIUM (PORCINE) 5000 UNIT/ML IJ SOLN: 5000.0000 [IU] | Freq: Three times a day (TID) | INTRAMUSCULAR | Status: DC

## 2023-08-17 MED ORDER — INSULIN ASPART 100 UNIT/ML IJ SOLN
0.0000 [IU] | INTRAMUSCULAR | Status: DC
  Administered 2023-08-17: 2 [IU] via SUBCUTANEOUS
  Administered 2023-08-17 – 2023-08-18 (×4): 1 [IU] via SUBCUTANEOUS
  Administered 2023-08-18: 2 [IU] via SUBCUTANEOUS
  Administered 2023-08-18: 1 [IU] via SUBCUTANEOUS
  Administered 2023-08-19 (×5): 2 [IU] via SUBCUTANEOUS
  Administered 2023-08-20: 1 [IU] via SUBCUTANEOUS
  Administered 2023-08-20: 2 [IU] via SUBCUTANEOUS
  Administered 2023-08-20: 1 [IU] via SUBCUTANEOUS
  Administered 2023-08-20: 2 [IU] via SUBCUTANEOUS
  Administered 2023-08-21 – 2023-08-23 (×6): 1 [IU] via SUBCUTANEOUS
  Administered 2023-08-23 (×2): 2 [IU] via SUBCUTANEOUS
  Administered 2023-08-24 (×2): 1 [IU] via SUBCUTANEOUS
  Administered 2023-08-24: 2 [IU] via SUBCUTANEOUS
  Administered 2023-08-26: 1 [IU] via SUBCUTANEOUS
  Administered 2023-08-26: 2 [IU] via SUBCUTANEOUS
  Administered 2023-08-27: 1 [IU] via SUBCUTANEOUS
  Administered 2023-08-27: 2 [IU] via SUBCUTANEOUS
  Administered 2023-08-27 – 2023-08-28 (×3): 1 [IU] via SUBCUTANEOUS
  Administered 2023-08-28: 3 [IU] via SUBCUTANEOUS
  Administered 2023-08-28 (×3): 2 [IU] via SUBCUTANEOUS
  Administered 2023-08-29: 5 [IU] via SUBCUTANEOUS
  Administered 2023-08-29: 1 [IU] via SUBCUTANEOUS
  Administered 2023-08-29: 3 [IU] via SUBCUTANEOUS
  Filled 2023-08-17 (×39): qty 1

## 2023-08-17 MED ORDER — PROPOFOL 1000 MG/100ML IV EMUL
5.0000 ug/kg/min | INTRAVENOUS | Status: DC
  Administered 2023-08-17: 5 ug/kg/min via INTRAVENOUS

## 2023-08-17 MED ORDER — FENTANYL CITRATE PF 50 MCG/ML IJ SOSY
50.0000 ug | PREFILLED_SYRINGE | INTRAMUSCULAR | Status: DC | PRN
Start: 2023-08-17 — End: 2023-08-18
  Administered 2023-08-18 (×2): 100 ug via INTRAVENOUS
  Filled 2023-08-17 (×2): qty 2
  Filled 2023-08-17: qty 4

## 2023-08-17 MED ORDER — POTASSIUM CHLORIDE 10 MEQ/100ML IV SOLN
10.0000 meq | INTRAVENOUS | Status: AC
  Administered 2023-08-17 (×3): 10 meq via INTRAVENOUS
  Filled 2023-08-17 (×3): qty 100

## 2023-08-17 MED ORDER — DOCUSATE SODIUM 50 MG/5ML PO LIQD
100.0000 mg | Freq: Two times a day (BID) | ORAL | Status: DC
  Administered 2023-08-17 – 2023-08-18 (×3): 100 mg
  Filled 2023-08-17 (×3): qty 10

## 2023-08-17 MED ORDER — DEXMEDETOMIDINE HCL IN NACL 400 MCG/100ML IV SOLN
0.0000 ug/kg/h | INTRAVENOUS | Status: DC
  Administered 2023-08-17: 0.7 ug/kg/h via INTRAVENOUS
  Administered 2023-08-17: 0.4 ug/kg/h via INTRAVENOUS
  Administered 2023-08-18 (×5): 1.2 ug/kg/h via INTRAVENOUS
  Administered 2023-08-18: 0.9 ug/kg/h via INTRAVENOUS
  Administered 2023-08-19 (×3): 1.2 ug/kg/h via INTRAVENOUS
  Administered 2023-08-23: 0.7 ug/kg/h via INTRAVENOUS
  Administered 2023-08-23: 0.3 ug/kg/h via INTRAVENOUS
  Administered 2023-08-23: 0.8 ug/kg/h via INTRAVENOUS
  Administered 2023-08-24: 1 ug/kg/h via INTRAVENOUS
  Filled 2023-08-17 (×17): qty 100

## 2023-08-17 MED ORDER — ENOXAPARIN SODIUM 60 MG/0.6ML IJ SOSY
45.0000 mg | PREFILLED_SYRINGE | INTRAMUSCULAR | Status: DC
  Filled 2023-08-17: qty 0.6

## 2023-08-17 MED ORDER — POTASSIUM CHLORIDE 20 MEQ PO PACK: 40.0000 meq | PACK | Freq: Once | ORAL | Status: DC

## 2023-08-17 MED ORDER — ORAL CARE MOUTH RINSE
15.0000 mL | OROMUCOSAL | Status: DC
Start: 2023-08-17 — End: 2023-08-18
  Administered 2023-08-17 – 2023-08-18 (×14): 15 mL via OROMUCOSAL

## 2023-08-17 MED ORDER — THIAMINE MONONITRATE 100 MG PO TABS
100.0000 mg | ORAL_TABLET | Freq: Every day | ORAL | Status: DC
  Administered 2023-08-17 – 2023-08-18 (×2): 100 mg
  Filled 2023-08-17 (×2): qty 1

## 2023-08-17 NOTE — Progress Notes (Signed)
   08/17/23 1800  Spiritual Encounters  Type of Visit Initial  Care provided to: Patient  Referral source Code page  Reason for visit Religious ritual  OnCall Visit Yes  Spiritual Framework  Presenting Themes Meaning/purpose/sources of inspiration  Community/Connection Family  Interventions  Spiritual Care Interventions Made Compassionate presence;Prayer  Intervention Outcomes  Outcomes Connection to spiritual care  Spiritual Care Plan  Spiritual Care Issues Still Outstanding No further spiritual care needs at this time (see row info)   Family Member requested prayer for pt. Chaplain prayed for pt. Family was not present

## 2023-08-17 NOTE — Plan of Care (Signed)
Problem: Education: Goal: Ability to describe self-care measures that may prevent or decrease complications (Diabetes Survival Skills Education) will improve 08/17/2023 2106 by Dorma Russell, RN Outcome: Progressing 08/17/2023 2106 by Dorma Russell, RN Outcome: Progressing Goal: Individualized Educational Video(s) 08/17/2023 2106 by Dorma Russell, RN Outcome: Progressing 08/17/2023 2106 by Dorma Russell, RN Outcome: Progressing   Problem: Coping: Goal: Ability to adjust to condition or change in health will improve 08/17/2023 2106 by Dorma Russell, RN Outcome: Progressing 08/17/2023 2106 by Dorma Russell, RN Outcome: Progressing   Problem: Fluid Volume: Goal: Ability to maintain a balanced intake and output will improve 08/17/2023 2106 by Dorma Russell, RN Outcome: Progressing 08/17/2023 2106 by Dorma Russell, RN Outcome: Progressing   Problem: Health Behavior/Discharge Planning: Goal: Ability to identify and utilize available resources and services will improve 08/17/2023 2106 by Dorma Russell, RN Outcome: Progressing 08/17/2023 2106 by Dorma Russell, RN Outcome: Progressing Goal: Ability to manage health-related needs will improve 08/17/2023 2106 by Dorma Russell, RN Outcome: Progressing 08/17/2023 2106 by Dorma Russell, RN Outcome: Progressing   Problem: Metabolic: Goal: Ability to maintain appropriate glucose levels will improve 08/17/2023 2106 by Dorma Russell, RN Outcome: Progressing 08/17/2023 2106 by Dorma Russell, RN Outcome: Progressing   Problem: Nutritional: Goal: Maintenance of adequate nutrition will improve 08/17/2023 2106 by Dorma Russell, RN Outcome: Progressing 08/17/2023 2106 by Dorma Russell, RN Outcome: Progressing Goal: Progress toward achieving an optimal weight will improve 08/17/2023 2106 by Dorma Russell, RN Outcome: Progressing 08/17/2023 2106 by Dorma Russell, RN Outcome: Progressing   Problem: Skin  Integrity: Goal: Risk for impaired skin integrity will decrease 08/17/2023 2106 by Dorma Russell, RN Outcome: Progressing 08/17/2023 2106 by Dorma Russell, RN Outcome: Progressing   Problem: Tissue Perfusion: Goal: Adequacy of tissue perfusion will improve 08/17/2023 2106 by Dorma Russell, RN Outcome: Progressing 08/17/2023 2106 by Dorma Russell, RN Outcome: Progressing   Problem: Education: Goal: Knowledge of General Education information will improve Description: Including pain rating scale, medication(s)/side effects and non-pharmacologic comfort measures 08/17/2023 2106 by Dorma Russell, RN Outcome: Progressing 08/17/2023 2106 by Dorma Russell, RN Outcome: Progressing   Problem: Health Behavior/Discharge Planning: Goal: Ability to manage health-related needs will improve 08/17/2023 2106 by Dorma Russell, RN Outcome: Progressing 08/17/2023 2106 by Dorma Russell, RN Outcome: Progressing   Problem: Clinical Measurements: Goal: Ability to maintain clinical measurements within normal limits will improve 08/17/2023 2106 by Dorma Russell, RN Outcome: Progressing 08/17/2023 2106 by Dorma Russell, RN Outcome: Progressing Goal: Will remain free from infection 08/17/2023 2106 by Dorma Russell, RN Outcome: Progressing 08/17/2023 2106 by Dorma Russell, RN Outcome: Progressing Goal: Diagnostic test results will improve 08/17/2023 2106 by Dorma Russell, RN Outcome: Progressing 08/17/2023 2106 by Dorma Russell, RN Outcome: Progressing Goal: Respiratory complications will improve 08/17/2023 2106 by Dorma Russell, RN Outcome: Progressing 08/17/2023 2106 by Dorma Russell, RN Outcome: Progressing Goal: Cardiovascular complication will be avoided 08/17/2023 2106 by Dorma Russell, RN Outcome: Progressing 08/17/2023 2106 by Dorma Russell, RN Outcome: Progressing   Problem: Activity: Goal: Risk for activity intolerance will decrease 08/17/2023 2106 by Dorma Russell, RN Outcome: Progressing 08/17/2023 2106 by Dorma Russell, RN Outcome: Progressing   Problem: Nutrition: Goal: Adequate nutrition will be maintained 08/17/2023 2106 by Dorma Russell, RN Outcome: Progressing 08/17/2023 2106 by Dorma Russell, RN Outcome: Progressing  Problem: Coping: Goal: Level of anxiety will decrease 08/17/2023 2106 by Dorma Russell, RN Outcome: Progressing 08/17/2023 2106 by Dorma Russell, RN Outcome: Progressing   Problem: Elimination: Goal: Will not experience complications related to bowel motility Outcome: Progressing Goal: Will not experience complications related to urinary retention Outcome: Progressing   Problem: Pain Managment: Goal: General experience of comfort will improve and/or be controlled Outcome: Progressing   Problem: Safety: Goal: Ability to remain free from injury will improve Outcome: Progressing   Problem: Skin Integrity: Goal: Risk for impaired skin integrity will decrease Outcome: Progressing

## 2023-08-17 NOTE — Progress Notes (Signed)
Patient becoming more anxious, uncontrolled movements, biting on tube. Patient unable to follow commands, restarted back on precedex drip @ 0.4 mcg

## 2023-08-17 NOTE — ED Notes (Signed)
Blood sent to lab

## 2023-08-17 NOTE — Progress Notes (Signed)
Patient unable to follow commands, wua deferred at this time. Patient remains off of sedation

## 2023-08-17 NOTE — Progress Notes (Signed)
Bear hugger placed for temp of 34.0 via temp foley. Annabelle Harman, NP informed about blue OG liquid return.
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
## 2023-08-17 NOTE — Progress Notes (Signed)
Patient arrived from the ED at [phone]. Placed on monitor. Levophed infusing at  18mcg/min or 15 ml/hr. Precedex infusing at 0.65mcg/kg/hr or 8.8 ml/hr. Vent in place and at settings as follows PRVC rate 18, Fio2 40% TV 470 and peep 8. 7.5 ETT 23 at lips. Og hooked to suction and placed on low intermitted suction. Fluid returned from OG is blue in color. Sinus Braycardia on monitor at 56. CBG is 158. Labs being obtained by lab.  Pictures of skin preformed by Astrid Drafts, Charge RN and linked to chart. Patient has 1 bracelet on right arm with 3 rings on 3 different fingers. Dentures in case and placed at bedside. Jacket, shoes and clothes at bedside. CHG bath given. Vital signs stable with medication assisting, call bell within reach side rails up X2, bed in the lowest position. Will continue to monitor.

## 2023-08-17 NOTE — Progress Notes (Signed)
Initial Nutrition Assessment  DOCUMENTATION CODES:   Obesity unspecified  INTERVENTION:   -Once able, start TF via OGT:   Initiate Vital High Protein @ 20 ml/hr and increase by 10 ml every 4 hours to goal rate of 40 ml/hr.   60 ml Prosource TF BID  30 ml free water flush every 4 hours   Tube feeding regimen provides 1120 kcal (100% of needs), 127 grams of protein, and 803 ml of H2O.  Total free water: 983 ml daily  NUTRITION DIAGNOSIS:   Inadequate oral intake related to inability to eat as evidenced by NPO status.  GOAL:   Provide needs based on ASPEN/SCCM guidelines  MONITOR:   Vent status  REASON FOR ASSESSMENT:   Ventilator    ASSESSMENT:   Pt with PMH of COPD, asthma, and OSA admitted due to unresponsiveness.  Pt admitted with acute respiratory failure secondary to drug overdose.   Patient is currently intubated on ventilator support. Per KUB today, tube placement confirmed in stomach.  MV: 8.3 L/min Temp (24hrs), Avg:95.3 F (35.2 C), Min:93.2 F (34 C), Max:99.3 F (37.4 C)  Reviewed I/O's: -3.9 L x 24 hours  UOP: 3.9 L x 24 hours  Case discussed with RN; pt wtill with blue secretions out of tube. Per family members, they do not think pt consumed anything blue colored PTA. Due to this concern, RN and MD report plan to continue to monitor OGT output and reassess starting TF tomorrow.   Reviewed wt hx; pt has experienced a 1.9% wt loss over the past 2 months, which is not significant for time frame.   Medications reviewed and include colace, lovenox, folic acid, miralax, potassium chloride, thiamine, precedex, and levophed.  Labs reviewed: CBGS: 129-164 (inpatient orders for glycemic control are 0-9 units insulin aspart every 4 hours). Tox screen positive for cannabinoid, cocaine, and amphetamines.    NUTRITION - FOCUSED PHYSICAL EXAM:  Flowsheet Row Most Recent Value  Orbital Region No depletion  Upper Arm Region No depletion  Thoracic and Lumbar  Region No depletion  Buccal Region No depletion  Temple Region No depletion  Clavicle Bone Region No depletion  Clavicle and Acromion Bone Region No depletion  Scapular Bone Region No depletion  Dorsal Hand No depletion  Patellar Region No depletion  Anterior Thigh Region No depletion  Posterior Calf Region No depletion  Edema (RD Assessment) Mild  Hair Reviewed  Eyes Reviewed  Mouth Reviewed  Skin Reviewed  Nails Reviewed       Diet Order:   Diet Order             Diet NPO time specified  Diet effective now                   EDUCATION NEEDS:   Not appropriate for education at this time  Skin:  Skin Assessment: Skin Integrity Issues: Skin Integrity Issues:: Stage I Stage I: coccyx  Last BM:  Unknown  Height:   Ht Readings from Last 1 Encounters:  07/24/23 5\' 7"  (1.702 m)    Weight:   Wt Readings from Last 1 Encounters:  08/17/23 86.9 kg    Ideal Body Weight:  61.4 kg  BMI:  Body mass index is 30.01 kg/m.  Estimated Nutritional Needs:   Kcal:  [phone]  Protein:  > 123 grams  Fluid:  1.1-1.3 L    Levada Schilling, RD, LDN, CDCES Registered Dietitian III Certified Diabetes Care and Education Specialist If unable to reach this RD, please  use "RD Inpatient" group chat on secure chat between hours of 8am-4 pm daily
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
## 2023-08-17 NOTE — Progress Notes (Signed)
PHARMACIST - PHYSICIAN COMMUNICATION  CONCERNING:  Enoxaparin (Lovenox) for DVT Prophylaxis    RECOMMENDATION: Patient was prescribed enoxaprin 40mg  q24 hours for VTE prophylaxis.   Filed Weights   08/17/23 0417  Weight: 88.4 kg (194 lb 14.2 oz)    Body mass index is 30.52 kg/m.  Estimated Creatinine Clearance: 87.4 mL/min (by C-G formula based on SCr of 0.84 mg/dL).   Based on [facility] policy patient is candidate for enoxaparin 0.5mg /kg TBW SQ every 24 hours based on BMI being >30.  DESCRIPTION: Pharmacy has adjusted enoxaparin dose per [facility] policy.  Patient is now receiving enoxaparin 0.5 mg/kg every 24 hours   Otelia Sergeant, PharmD, [facility] 08/17/2023 5:52 AM

## 2023-08-17 NOTE — Progress Notes (Signed)
Pre wake up assessment started. Precedex drip stopped at 0811. No response, no purpuposeful movement.

## 2023-08-17 NOTE — Plan of Care (Signed)
  Problem: Fluid Volume: Goal: Ability to maintain a balanced intake and output will improve Outcome: Progressing   Problem: Metabolic: Goal: Ability to maintain appropriate glucose levels will improve Outcome: Progressing   Problem: Clinical Measurements: Goal: Will remain free from infection Outcome: Progressing Goal: Diagnostic test results will improve Outcome: Progressing Goal: Respiratory complications will improve Outcome: Progressing Goal: Cardiovascular complication will be avoided Outcome: Progressing

## 2023-08-17 NOTE — Progress Notes (Signed)
Pt transported to ICU on the vent without incident. Pt remains on the vent and is tol well. Report given to ICU RT. °

## 2023-08-17 NOTE — Progress Notes (Signed)
Pt transported to CT on the vent and returned to ED 24 without incident. Pt remains on the vent and is tol well at this time.

## 2023-08-18 ENCOUNTER — Inpatient Hospital Stay: Payer: Medicaid Other

## 2023-08-18 ENCOUNTER — Encounter: Payer: Self-pay | Admitting: Internal Medicine

## 2023-08-18 DIAGNOSIS — I10 Essential (primary) hypertension: Secondary | ICD-10-CM | POA: Diagnosis not present

## 2023-08-18 DIAGNOSIS — J9601 Acute respiratory failure with hypoxia: Secondary | ICD-10-CM | POA: Diagnosis not present

## 2023-08-18 DIAGNOSIS — R4182 Altered mental status, unspecified: Secondary | ICD-10-CM | POA: Diagnosis not present

## 2023-08-18 DIAGNOSIS — E119 Type 2 diabetes mellitus without complications: Secondary | ICD-10-CM

## 2023-08-18 DIAGNOSIS — F1490 Cocaine use, unspecified, uncomplicated: Secondary | ICD-10-CM | POA: Diagnosis not present

## 2023-08-18 LAB — GLUCOSE, CAPILLARY
Glucose-Capillary: 117 mg/dL — ABNORMAL HIGH (ref 70–99)
Glucose-Capillary: 132 mg/dL — ABNORMAL HIGH (ref 70–99)
Glucose-Capillary: 135 mg/dL — ABNORMAL HIGH (ref 70–99)
Glucose-Capillary: 139 mg/dL — ABNORMAL HIGH (ref 70–99)
Glucose-Capillary: 171 mg/dL — ABNORMAL HIGH (ref 70–99)
Glucose-Capillary: 193 mg/dL — ABNORMAL HIGH (ref 70–99)

## 2023-08-18 LAB — BASIC METABOLIC PANEL
Anion gap: 10 (ref 5–15)
BUN: 15 mg/dL (ref 6–20)
CO2: 22 mmol/L (ref 22–32)
Calcium: 9 mg/dL (ref 8.9–10.3)
Chloride: 111 mmol/L (ref 98–111)
Creatinine, Ser: 0.73 mg/dL (ref 0.44–1.00)
GFR, Estimated: >60 mL/min (ref >60)
Glucose, Bld: 144 mg/dL — ABNORMAL HIGH (ref 70–99)
Potassium: 3.6 mmol/L (ref 3.5–5.1)
Sodium: 143 mmol/L (ref 135–145)

## 2023-08-18 LAB — PHOSPHORUS
Phosphorus: 3 mg/dL (ref 2.5–4.6)
Phosphorus: 2.7 mg/dL (ref 2.5–4.6)
Phosphorus: 3.2 mg/dL (ref 2.5–4.6)

## 2023-08-18 LAB — MAGNESIUM
Magnesium: 2.1 mg/dL (ref 1.7–2.4)
Magnesium: 2 mg/dL (ref 1.7–2.4)
Magnesium: 1.9 mg/dL (ref 1.7–2.4)

## 2023-08-18 LAB — CBC
HCT: 41.6 % (ref 36.0–46.0)
Hemoglobin: 13.7 g/dL (ref 12.0–15.0)
MCH: 30.4 pg (ref 26.0–34.0)
MCHC: 32.9 g/dL (ref 30.0–36.0)
MCV: 92.2 fL (ref 80.0–100.0)
Platelets: 219 10*3/uL (ref 150–400)
RBC: 4.51 MIL/uL (ref 3.87–5.11)
RDW: 12.2 % (ref 11.5–15.5)
WBC: 10.9 10*3/uL — ABNORMAL HIGH (ref 4.0–10.5)
nRBC: 0 % (ref 0.0–0.2)

## 2023-08-18 LAB — PROCALCITONIN: Procalcitonin: <0.1 ng/mL

## 2023-08-18 MED ORDER — SODIUM CHLORIDE 0.9 % IV SOLN: INTRAVENOUS | Status: AC | PRN

## 2023-08-18 MED ORDER — THIAMINE MONONITRATE 100 MG PO TABS
100.0000 mg | ORAL_TABLET | Freq: Every day | ORAL | Status: DC
  Administered 2023-08-23 – 2023-09-15 (×23): 100 mg
  Filled 2023-08-18 (×24): qty 1

## 2023-08-18 MED ORDER — CHLORDIAZEPOXIDE HCL 25 MG PO CAPS: 25.0000 mg | ORAL_CAPSULE | ORAL | Status: DC

## 2023-08-18 MED ORDER — IPRATROPIUM-ALBUTEROL 0.5-2.5 (3) MG/3ML IN SOLN
3.0000 mL | RESPIRATORY_TRACT | Status: DC
  Administered 2023-08-18: 3 mL via RESPIRATORY_TRACT
  Filled 2023-08-18 (×3): qty 3

## 2023-08-18 MED ORDER — CHLORDIAZEPOXIDE HCL 25 MG PO CAPS: 25.0000 mg | ORAL_CAPSULE | Freq: Every day | ORAL | Status: DC

## 2023-08-18 MED ORDER — PNEUMOCOCCAL 20-VAL CONJ VACC 0.5 ML IM SUSY: 0.5000 mL | PREFILLED_SYRINGE | INTRAMUSCULAR | Status: DC

## 2023-08-18 MED ORDER — OSMOLITE 1.5 CAL PO LIQD
1000.0000 mL | ORAL | Status: DC
Start: 2023-08-18 — End: 2023-08-21

## 2023-08-18 MED ORDER — ORAL CARE MOUTH RINSE: 15.0000 mL | Freq: Three times a day (TID) | OROMUCOSAL | Status: DC

## 2023-08-18 MED ORDER — PIVOT 1.5 CAL PO LIQD: 1000.0000 mL | ORAL | Status: DC

## 2023-08-18 MED ORDER — QUETIAPINE FUMARATE 25 MG PO TABS
50.0000 mg | ORAL_TABLET | Freq: Two times a day (BID) | ORAL | Status: DC
  Administered 2023-08-18 – 2023-08-22 (×10): 50 mg
  Filled 2023-08-18 (×11): qty 2

## 2023-08-18 MED ORDER — CHLORDIAZEPOXIDE HCL 25 MG PO CAPS: 25.0000 mg | ORAL_CAPSULE | Freq: Three times a day (TID) | ORAL | Status: DC

## 2023-08-18 MED ORDER — PHENOBARBITAL SODIUM 130 MG/ML IJ SOLN
130.0000 mg | Freq: Once | INTRAMUSCULAR | Status: AC
  Administered 2023-08-18: 130 mg via INTRAVENOUS
  Filled 2023-08-18: qty 1

## 2023-08-18 MED ORDER — DEXAMETHASONE SODIUM PHOSPHATE 4 MG/ML IJ SOLN
4.0000 mg | Freq: Four times a day (QID) | INTRAMUSCULAR | Status: AC
  Administered 2023-08-18 – 2023-08-19 (×3): 4 mg via INTRAVENOUS
  Filled 2023-08-18 (×3): qty 1

## 2023-08-18 MED ORDER — POTASSIUM CHLORIDE 20 MEQ PO PACK
40.0000 meq | PACK | Freq: Once | ORAL | Status: AC
  Administered 2023-08-18: 40 meq
  Filled 2023-08-18: qty 2

## 2023-08-18 MED ORDER — CHLORDIAZEPOXIDE HCL 25 MG PO CAPS
25.0000 mg | ORAL_CAPSULE | Freq: Four times a day (QID) | ORAL | Status: AC
  Administered 2023-08-18 (×3): 25 mg
  Filled 2023-08-18 (×3): qty 1

## 2023-08-18 MED ORDER — CHLORDIAZEPOXIDE HCL 25 MG PO CAPS
25.0000 mg | ORAL_CAPSULE | Freq: Four times a day (QID) | ORAL | Status: DC
  Administered 2023-08-18: 25 mg via ORAL
  Filled 2023-08-18: qty 1

## 2023-08-18 MED ORDER — PHENOBARBITAL SODIUM 130 MG/ML IJ SOLN
130.0000 mg | Freq: Once | INTRAMUSCULAR | Status: AC
Start: 2023-08-18 — End: 2023-08-18
  Administered 2023-08-18: 130 mg via INTRAVENOUS
  Filled 2023-08-18: qty 1

## 2023-08-18 MED ORDER — CHLORDIAZEPOXIDE HCL 25 MG PO CAPS
25.0000 mg | ORAL_CAPSULE | Freq: Three times a day (TID) | ORAL | Status: DC
  Administered 2023-08-19: 25 mg
  Filled 2023-08-18: qty 1

## 2023-08-18 MED ORDER — ORAL CARE MOUTH RINSE: 15.0000 mL | OROMUCOSAL | Status: DC | PRN

## 2023-08-18 MED ORDER — ONDANSETRON 4 MG PO TBDP: 4.0000 mg | ORAL_TABLET | Freq: Four times a day (QID) | ORAL | Status: AC | PRN

## 2023-08-18 MED ORDER — THIAMINE HCL 100 MG/ML IJ SOLN
500.0000 mg | INTRAVENOUS | Status: AC
  Administered 2023-08-18 – 2023-08-21 (×4): 500 mg via INTRAVENOUS
  Filled 2023-08-18 (×2): qty 5
  Filled 2023-08-18: qty 4
  Filled 2023-08-18: qty 5

## 2023-08-18 MED ORDER — LOPERAMIDE HCL 2 MG PO CAPS
2.0000 mg | ORAL_CAPSULE | ORAL | Status: AC | PRN
Start: 2023-08-18 — End: 2023-08-21

## 2023-08-18 MED ORDER — INFLUENZA VIRUS VACC SPLIT PF (FLUZONE) 0.5 ML IM SUSY: 0.5000 mL | PREFILLED_SYRINGE | INTRAMUSCULAR | Status: DC

## 2023-08-18 MED ORDER — ORAL CARE MOUTH RINSE
15.0000 mL | OROMUCOSAL | Status: DC
Start: 2023-08-18 — End: 2023-08-19

## 2023-08-18 MED ORDER — DEXAMETHASONE SODIUM PHOSPHATE 10 MG/ML IJ SOLN
10.0000 mg | Freq: Once | INTRAMUSCULAR | Status: AC
  Administered 2023-08-18: 10 mg via INTRAVENOUS
  Filled 2023-08-18: qty 1

## 2023-08-18 MED ORDER — RACEPINEPHRINE HCL 2.25 % IN NEBU
0.5000 mL | INHALATION_SOLUTION | RESPIRATORY_TRACT | Status: DC | PRN
  Administered 2023-08-23: 0.5 mL via RESPIRATORY_TRACT
  Filled 2023-08-18: qty 0.5

## 2023-08-18 MED ORDER — RACEPINEPHRINE HCL 2.25 % IN NEBU
0.5000 mL | INHALATION_SOLUTION | Freq: Once | RESPIRATORY_TRACT | Status: AC
Start: 2023-08-18 — End: 2023-08-18
  Administered 2023-08-18: 0.5 mL via RESPIRATORY_TRACT
  Filled 2023-08-18: qty 0.5

## 2023-08-18 MED ORDER — FREE WATER
30.0000 mL | Status: DC
  Administered 2023-08-18 – 2023-09-04 (×93): 30 mL

## 2023-08-18 MED ORDER — PROSOURCE TF20 ENFIT COMPATIBL EN LIQD
60.0000 mL | Freq: Every day | ENTERAL | Status: DC
  Administered 2023-08-18 – 2023-09-14 (×26): 60 mL
  Filled 2023-08-18 (×13): qty 60

## 2023-08-18 NOTE — Progress Notes (Signed)
Jeri Modena NP at bedside, notified of elevated BP 167/85 pulse 58. No new orders received. Will continue to monitor.

## 2023-08-18 NOTE — Progress Notes (Signed)
Pt becoming anxious, uncontrolled movements, trying to get out of bed, pulling at lines. Precedex  at 1.2 mcg. Jeri Modena NP notified. Per NP will order phenobarbital. Will administer and continue to monitor.
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
## 2023-08-18 NOTE — Progress Notes (Signed)
Extubation order written. Cuff leak noted. Patient extubated and placed on 2L [place]. Will continue to monitor.

## 2023-08-18 NOTE — Plan of Care (Signed)
Patient remains on AR-ICU at time of writing. Patient is s/p extubation today and appears to be maintaining airway patency. Patient remains on a continuous infusion of Precedex. Patient awakens to voice and is able to state name and intermittently follow commands. PIV access only. NGT remains in place and continuous enteral feeds remain on hold from AM shift today, per Harlon Ditty, NP. 1 degree AVB per telemetry strip. Patient remains on 2 L / min of supplemental O2 via [place].  Problem: Education: Goal: Ability to describe self-care measures that may prevent or decrease complications (Diabetes Survival Skills Education) will improve Outcome: Progressing Goal: Individualized Educational Video(s) Outcome: Progressing   Problem: Coping: Goal: Ability to adjust to condition or change in health will improve Outcome: Progressing   Problem: Fluid Volume: Goal: Ability to maintain a balanced intake and output will improve Outcome: Progressing   Problem: Health Behavior/Discharge Planning: Goal: Ability to identify and utilize available resources and services will improve Outcome: Progressing Goal: Ability to manage health-related needs will improve Outcome: Progressing   Problem: Metabolic: Goal: Ability to maintain appropriate glucose levels will improve Outcome: Progressing   Problem: Nutritional: Goal: Maintenance of adequate nutrition will improve Outcome: Progressing Goal: Progress toward achieving an optimal weight will improve Outcome: Progressing   Problem: Skin Integrity: Goal: Risk for impaired skin integrity will decrease Outcome: Progressing   Problem: Tissue Perfusion: Goal: Adequacy of tissue perfusion will improve Outcome: Progressing   Problem: Education: Goal: Knowledge of General Education information will improve Description: Including pain rating scale, medication(s)/side effects and non-pharmacologic comfort measures Outcome: Progressing   Problem: Health  Behavior/Discharge Planning: Goal: Ability to manage health-related needs will improve Outcome: Progressing   Problem: Clinical Measurements: Goal: Ability to maintain clinical measurements within normal limits will improve Outcome: Progressing Goal: Will remain free from infection Outcome: Progressing Goal: Diagnostic test results will improve Outcome: Progressing Goal: Respiratory complications will improve Outcome: Progressing Goal: Cardiovascular complication will be avoided Outcome: Progressing   Problem: Activity: Goal: Risk for activity intolerance will decrease Outcome: Progressing   Problem: Nutrition: Goal: Adequate nutrition will be maintained Outcome: Progressing   Problem: Coping: Goal: Level of anxiety will decrease Outcome: Progressing   Problem: Elimination: Goal: Will not experience complications related to bowel motility Outcome: Progressing Goal: Will not experience complications related to urinary retention Outcome: Progressing   Problem: Pain Managment: Goal: General experience of comfort will improve and/or be controlled Outcome: Progressing   Problem: Safety: Goal: Ability to remain free from injury will improve Outcome: Progressing   Problem: Skin Integrity: Goal: Risk for impaired skin integrity will decrease Outcome: Progressing   Problem: Education: Goal: Knowledge of disease or condition will improve Outcome: Progressing Goal: Knowledge of the prescribed therapeutic regimen will improve Outcome: Progressing Goal: Individualized Educational Video(s) Outcome: Progressing   Problem: Activity: Goal: Ability to tolerate increased activity will improve Outcome: Progressing Goal: Will verbalize the importance of balancing activity with adequate rest periods Outcome: Progressing   Problem: Respiratory: Goal: Ability to maintain a clear airway will improve Outcome: Progressing Goal: Levels of oxygenation will improve Outcome:  Progressing Goal: Ability to maintain adequate ventilation will improve Outcome: Progressing

## 2023-08-18 NOTE — Consult Note (Signed)
PHARMACY CONSULT NOTE - ELECTROLYTES  Pharmacy Consult for Electrolyte Monitoring and Replacement   Recent Labs: Potassium (mmol/L)  Date Value  08/18/2023 3.6  10/15/2012 3.0 (L)   Magnesium (mg/dL)  Date Value  16/04/9603 2.1  10/04/2011 1.4 (L)   Calcium (mg/dL)  Date Value  54/03/8118 9.0   Calcium, Total (mg/dL)  Date Value  14/78/2956 8.3 (L)   Albumin (g/dL)  Date Value  21/30/8657 3.7  06/13/2023 4.3  10/15/2012 3.9   Phosphorus (mg/dL)  Date Value  84/69/6295 3.0   Sodium (mmol/L)  Date Value  08/18/2023 143  06/13/2023 142  10/15/2012 141   Height: 5\' 6"  (167.6 cm) Weight: 86.9 kg (191 lb 9.3 oz) IBW/kg (Calculated) : 59.3 Estimated Creatinine Clearance: 89.2 mL/min (by C-G formula based on SCr of 0.73 mg/dL).  Assessment  Andrea Sanford is a 53 y.o. female presenting with drug overdose / intoxication. PMH significant for polysubstance abuse, anxiety / depression, asthma / COPD, bipolar disorder, DM, neuromuscular disorder. Pharmacy has been consulted to monitor and replace electrolytes.  Diet: NPO MIVF: N/A Pertinent medications: N/A  Goal of Therapy: Electrolytes within normal limits  Plan:  K 3.6, Kcl 40 mEq per tube x 1 dose Electrolytes with labs tomorrow AM  Thank you for allowing pharmacy to be a part of this patient's care.  Tressie Ellis 08/18/2023 9:17 AM

## 2023-08-18 NOTE — Progress Notes (Addendum)
Nutrition Follow-up  DOCUMENTATION CODES:   Obesity unspecified  INTERVENTION:   -TF via NGT:   Initiate Osmolite 1.5 @ 20 ml/hr and increase by 10 ml every 8 hours to goal rate of 50 ml/hr.   60 ml Prosource TF daily  30 ml free water flush every 4 hours  Tube feeding regimen provides 1880 kcal (100% of needs), 95 grams of protein, and 914 ml of H2O. Total free water: 1094 ml daily  -Continue MVI with minerals daily via tube -Continue 100 mg thiamine daily via tube -Continue 1 mg folic acid daily via tube -Monitor Mg, K, and Phos and replete as needed secondary to high refeeding risk  NUTRITION DIAGNOSIS:   Inadequate oral intake related to inability to eat as evidenced by NPO status.  Ongoing  GOAL:   Patient will meet greater than or equal to 90% of their needs  Progressing   MONITOR:   Diet advancement, TF tolerance  REASON FOR ASSESSMENT:   Ventilator    ASSESSMENT:   Pt with PMH of COPD, asthma, and OSA admitted due to unresponsiveness.  1/23- intubated 1/24- extubated, s/p NGT placement- KUB reveals tip of tube in stomach  Reviewed I/O's: -709 ml x 24 hours and -4.6 L since admission  UOP: 1.9 L x 24 hours  OGT output: 250 ml x 24 hours  Case discussed with MD, RN, and during ICU rounds. Plan to extubate today, RN to place NGT prior to extubation. RD received permission to start TF once pt is extubated.   Wt has been stable since last visit.   Medications reviewed and include thiamine, folic acid, MVI, lovenox, and decadron.   Labs reviewed: K, Mg, and Phos WDL. CBGS: 108-139 (inpatient orders for glycemic control are none).    Diet Order:   Diet Order             Diet NPO time specified  Diet effective now                   EDUCATION NEEDS:   Not appropriate for education at this time  Skin:  Skin Assessment: Skin Integrity Issues: Skin Integrity Issues:: Stage I Stage I: coccyx  Last BM:  08/18/23  Height:   Ht Readings  from Last 1 Encounters:  08/17/23 5\' 6"  (1.676 m)    Weight:   Wt Readings from Last 1 Encounters:  08/18/23 86.9 kg    Ideal Body Weight:  61.4 kg  BMI:  Body mass index is 30.92 kg/m.  Estimated Nutritional Needs:   Kcal:  1850-2050  Protein:  90-105 grams  Fluid:  > 1.8 L    Levada Schilling, RD, LDN, CDCES Registered Dietitian III Certified Diabetes Care and Education Specialist If unable to reach this RD, please use "RD Inpatient" group chat on secure chat between hours of 8am-4 pm daily

## 2023-08-18 NOTE — Plan of Care (Signed)
  Problem: Education: Goal: Ability to describe self-care measures that may prevent or decrease complications (Diabetes Survival Skills Education) will improve Outcome: Progressing   Problem: Fluid Volume: Goal: Ability to maintain a balanced intake and output will improve Outcome: Progressing   Problem: Tissue Perfusion: Goal: Adequacy of tissue perfusion will improve Outcome: Progressing   Problem: Pain Managment: Goal: General experience of comfort will improve and/or be controlled Outcome: Progressing

## 2023-08-19 ENCOUNTER — Inpatient Hospital Stay: Payer: Medicaid Other

## 2023-08-19 ENCOUNTER — Other Ambulatory Visit: Payer: Self-pay

## 2023-08-19 DIAGNOSIS — J9600 Acute respiratory failure, unspecified whether with hypoxia or hypercapnia: Secondary | ICD-10-CM

## 2023-08-19 DIAGNOSIS — F1290 Cannabis use, unspecified, uncomplicated: Secondary | ICD-10-CM | POA: Diagnosis not present

## 2023-08-19 DIAGNOSIS — R0902 Hypoxemia: Secondary | ICD-10-CM | POA: Diagnosis not present

## 2023-08-19 DIAGNOSIS — F1490 Cocaine use, unspecified, uncomplicated: Secondary | ICD-10-CM | POA: Diagnosis not present

## 2023-08-19 DIAGNOSIS — F10920 Alcohol use, unspecified with intoxication, uncomplicated: Secondary | ICD-10-CM | POA: Diagnosis not present

## 2023-08-19 LAB — BLOOD GAS, ARTERIAL
Acid-base deficit: 2 mmol/L (ref 0.0–2.0)
Bicarbonate: 23.7 mmol/L (ref 20.0–28.0)
FIO2: 35 %
MECHVT: 420 mL
Mechanical Rate: 18
O2 Saturation: 94.4 %
PEEP: 5 cmH2O
Patient temperature: 37
pCO2 arterial: 43 mm[Hg] (ref 32–48)
pH, Arterial: 7.35 (ref 7.35–7.45)
pO2, Arterial: 70 mm[Hg] — ABNORMAL LOW (ref 83–108)

## 2023-08-19 LAB — RENAL FUNCTION PANEL
Albumin: 3.7 g/dL (ref 3.5–5.0)
Anion gap: 12 (ref 5–15)
BUN: 16 mg/dL (ref 6–20)
CO2: 22 mmol/L (ref 22–32)
Calcium: 9.5 mg/dL (ref 8.9–10.3)
Chloride: 103 mmol/L (ref 98–111)
Creatinine, Ser: 0.56 mg/dL (ref 0.44–1.00)
GFR, Estimated: >60 mL/min (ref >60)
Glucose, Bld: 180 mg/dL — ABNORMAL HIGH (ref 70–99)
Phosphorus: 3.6 mg/dL (ref 2.5–4.6)
Potassium: 3.7 mmol/L (ref 3.5–5.1)
Sodium: 137 mmol/L (ref 135–145)

## 2023-08-19 LAB — GLUCOSE, CAPILLARY
Glucose-Capillary: 122 mg/dL — ABNORMAL HIGH (ref 70–99)
Glucose-Capillary: 161 mg/dL — ABNORMAL HIGH (ref 70–99)
Glucose-Capillary: 165 mg/dL — ABNORMAL HIGH (ref 70–99)
Glucose-Capillary: 171 mg/dL — ABNORMAL HIGH (ref 70–99)
Glucose-Capillary: 177 mg/dL — ABNORMAL HIGH (ref 70–99)
Glucose-Capillary: 184 mg/dL — ABNORMAL HIGH (ref 70–99)
Glucose-Capillary: 196 mg/dL — ABNORMAL HIGH (ref 70–99)

## 2023-08-19 LAB — PHOSPHORUS
Phosphorus: 3.4 mg/dL (ref 2.5–4.6)
Phosphorus: 3.3 mg/dL (ref 2.5–4.6)

## 2023-08-19 LAB — CBC
HCT: 42.7 % (ref 36.0–46.0)
Hemoglobin: 14.6 g/dL (ref 12.0–15.0)
MCH: 30 pg (ref 26.0–34.0)
MCHC: 34.2 g/dL (ref 30.0–36.0)
MCV: 87.9 fL (ref 80.0–100.0)
Platelets: 235 10*3/uL (ref 150–400)
RBC: 4.86 MIL/uL (ref 3.87–5.11)
RDW: 11.7 % (ref 11.5–15.5)
WBC: 16.2 10*3/uL — ABNORMAL HIGH (ref 4.0–10.5)
nRBC: 0 % (ref 0.0–0.2)

## 2023-08-19 LAB — PROCALCITONIN: Procalcitonin: <0.1 ng/mL

## 2023-08-19 LAB — MAGNESIUM
Magnesium: 2 mg/dL (ref 1.7–2.4)
Magnesium: 1.8 mg/dL (ref 1.7–2.4)

## 2023-08-19 MED ORDER — LORAZEPAM 2 MG/ML IJ SOLN
1.0000 mg | INTRAMUSCULAR | Status: DC | PRN
  Administered 2023-08-19 – 2023-08-25 (×4): 1 mg via INTRAVENOUS
  Filled 2023-08-19 (×4): qty 1

## 2023-08-19 MED ORDER — FAMOTIDINE 20 MG PO TABS
20.0000 mg | ORAL_TABLET | Freq: Two times a day (BID) | ORAL | Status: DC
  Administered 2023-08-19 – 2023-08-24 (×11): 20 mg
  Filled 2023-08-19 (×11): qty 1

## 2023-08-19 MED ORDER — PHENOBARBITAL SODIUM 65 MG/ML IJ SOLN
65.0000 mg | Freq: Once | INTRAMUSCULAR | Status: AC
  Administered 2023-08-19: 65 mg via INTRAVENOUS
  Filled 2023-08-19: qty 1

## 2023-08-19 MED ORDER — ACETAMINOPHEN 325 MG PO TABS: 650.0000 mg | ORAL_TABLET | Freq: Four times a day (QID) | ORAL | Status: DC | PRN

## 2023-08-19 MED ORDER — PHENOBARBITAL SODIUM 65 MG/ML IJ SOLN: 32.5000 mg | Freq: Three times a day (TID) | INTRAMUSCULAR | Status: DC

## 2023-08-19 MED ORDER — MIDAZOLAM HCL 2 MG/2ML IJ SOLN
4.0000 mg | Freq: Once | INTRAMUSCULAR | Status: AC
  Administered 2023-08-19: 4 mg via INTRAVENOUS
  Filled 2023-08-19: qty 4

## 2023-08-19 MED ORDER — SODIUM CHLORIDE 0.9 % IV SOLN
250.0000 mL | INTRAVENOUS | Status: AC
  Administered 2023-08-19 – 2023-08-20 (×2): 250 mL via INTRAVENOUS

## 2023-08-19 MED ORDER — ORAL CARE MOUTH RINSE: 15.0000 mL | OROMUCOSAL | Status: DC | PRN

## 2023-08-19 MED ORDER — LACTATED RINGERS IV BOLUS: 1000.0000 mL | Freq: Once | INTRAVENOUS | Status: DC

## 2023-08-19 MED ORDER — POLYETHYLENE GLYCOL 3350 17 G PO PACK
17.0000 g | PACK | Freq: Every day | ORAL | Status: DC
  Administered 2023-08-20 – 2023-08-26 (×5): 17 g
  Filled 2023-08-19 (×5): qty 1

## 2023-08-19 MED ORDER — FENTANYL 2500MCG IN NS 250ML (10MCG/ML) PREMIX INFUSION
50.0000 ug/h | INTRAVENOUS | Status: DC
  Administered 2023-08-19: 50 ug/h via INTRAVENOUS
  Administered 2023-08-20 – 2023-08-21 (×3): 150 ug/h via INTRAVENOUS
  Administered 2023-08-23: 100 ug/h via INTRAVENOUS
  Filled 2023-08-19 (×4): qty 250

## 2023-08-19 MED ORDER — PROPOFOL 1000 MG/100ML IV EMUL
INTRAVENOUS | Status: AC
  Filled 2023-08-19: qty 100

## 2023-08-19 MED ORDER — PANTOPRAZOLE SODIUM 40 MG IV SOLR
40.0000 mg | INTRAVENOUS | Status: DC
  Administered 2023-08-19 – 2023-09-15 (×27): 40 mg via INTRAVENOUS
  Filled 2023-08-19 (×28): qty 10

## 2023-08-19 MED ORDER — POTASSIUM CHLORIDE 20 MEQ PO PACK
40.0000 meq | PACK | Freq: Once | ORAL | Status: AC
  Administered 2023-08-19: 40 meq
  Filled 2023-08-19: qty 2

## 2023-08-19 MED ORDER — FENTANYL BOLUS VIA INFUSION
50.0000 ug | INTRAVENOUS | Status: DC | PRN
  Administered 2023-08-19: 100 ug via INTRAVENOUS

## 2023-08-19 MED ORDER — PROPOFOL 1000 MG/100ML IV EMUL
0.0000 ug/kg/min | INTRAVENOUS | Status: DC
  Administered 2023-08-19: 5 ug/kg/min via INTRAVENOUS
  Administered 2023-08-19: 30 ug/kg/min via INTRAVENOUS
  Administered 2023-08-20 (×3): 40 ug/kg/min via INTRAVENOUS
  Administered 2023-08-20: 30 ug/kg/min via INTRAVENOUS
  Administered 2023-08-21: 40 ug/kg/min via INTRAVENOUS
  Administered 2023-08-21: 30 ug/kg/min via INTRAVENOUS
  Administered 2023-08-21: 40 ug/kg/min via INTRAVENOUS
  Administered 2023-08-21: 5 ug/kg/min via INTRAVENOUS
  Administered 2023-08-22 – 2023-08-23 (×3): 30 ug/kg/min via INTRAVENOUS
  Administered 2023-08-23: 25 ug/kg/min via INTRAVENOUS
  Filled 2023-08-19 (×14): qty 100

## 2023-08-19 MED ORDER — PHENOBARBITAL SODIUM 65 MG/ML IJ SOLN: 65.0000 mg | Freq: Three times a day (TID) | INTRAMUSCULAR | Status: DC

## 2023-08-19 MED ORDER — FENTANYL 2500MCG IN NS 250ML (10MCG/ML) PREMIX INFUSION
INTRAVENOUS | Status: AC
  Filled 2023-08-19: qty 250

## 2023-08-19 MED ORDER — NOREPINEPHRINE 4 MG/250ML-% IV SOLN
2.0000 ug/min | INTRAVENOUS | Status: DC
Start: 2023-08-19 — End: 2023-08-24
  Administered 2023-08-19: 2 ug/min via INTRAVENOUS
  Administered 2023-08-20 (×2): 8 ug/min via INTRAVENOUS
  Administered 2023-08-21 (×2): 2 ug/min via INTRAVENOUS
  Filled 2023-08-19 (×3): qty 250

## 2023-08-19 MED ORDER — PIPERACILLIN-TAZOBACTAM 3.375 G IVPB
3.3750 g | Freq: Three times a day (TID) | INTRAVENOUS | Status: DC
  Administered 2023-08-19 – 2023-08-21 (×6): 3.375 g via INTRAVENOUS
  Filled 2023-08-19 (×6): qty 50

## 2023-08-19 MED ORDER — ORAL CARE MOUTH RINSE
15.0000 mL | OROMUCOSAL | Status: DC
  Administered 2023-08-19 – 2023-08-24 (×54): 15 mL via OROMUCOSAL

## 2023-08-19 MED ORDER — PHENOBARBITAL SODIUM 65 MG/ML IJ SOLN
32.5000 mg | Freq: Three times a day (TID) | INTRAMUSCULAR | Status: AC
  Administered 2023-08-23 – 2023-08-25 (×6): 32.5 mg via INTRAVENOUS
  Filled 2023-08-19 (×8): qty 1

## 2023-08-19 MED ORDER — FENTANYL CITRATE (PF) 100 MCG/2ML IJ SOLN
200.0000 ug | Freq: Once | INTRAMUSCULAR | Status: AC
  Administered 2023-08-19: 200 ug via INTRAVENOUS
  Filled 2023-08-19: qty 4

## 2023-08-19 MED ORDER — VECURONIUM BROMIDE 10 MG IV SOLR
20.0000 mg | Freq: Once | INTRAVENOUS | Status: AC
  Administered 2023-08-19: 20 mg via INTRAVENOUS
  Filled 2023-08-19: qty 20

## 2023-08-19 MED ORDER — DOCUSATE SODIUM 50 MG/5ML PO LIQD
100.0000 mg | Freq: Two times a day (BID) | ORAL | Status: DC
  Administered 2023-08-19 – 2023-08-25 (×8): 100 mg
  Filled 2023-08-19 (×9): qty 10

## 2023-08-19 MED ORDER — FENTANYL CITRATE PF 50 MCG/ML IJ SOSY: 50.0000 ug | PREFILLED_SYRINGE | Freq: Once | INTRAMUSCULAR | Status: DC

## 2023-08-19 MED ORDER — QUETIAPINE FUMARATE 25 MG PO TABS: 50.0000 mg | ORAL_TABLET | Freq: Two times a day (BID) | ORAL | Status: DC

## 2023-08-19 MED ORDER — PHENOBARBITAL SODIUM 130 MG/ML IJ SOLN: 97.5000 mg | Freq: Three times a day (TID) | INTRAMUSCULAR | Status: DC

## 2023-08-19 MED ORDER — PHENOBARBITAL SODIUM 130 MG/ML IJ SOLN
97.5000 mg | Freq: Three times a day (TID) | INTRAMUSCULAR | Status: AC
  Administered 2023-08-19 – 2023-08-21 (×5): 97.5 mg via INTRAVENOUS
  Filled 2023-08-19 (×5): qty 1

## 2023-08-19 MED ORDER — LACTATED RINGERS IV BOLUS
1000.0000 mL | Freq: Once | INTRAVENOUS | Status: AC
  Administered 2023-08-19: 1000 mL via INTRAVENOUS

## 2023-08-19 MED ORDER — PHENOBARBITAL SODIUM 65 MG/ML IJ SOLN
65.0000 mg | Freq: Three times a day (TID) | INTRAMUSCULAR | Status: AC
  Administered 2023-08-21 – 2023-08-23 (×6): 65 mg via INTRAVENOUS
  Filled 2023-08-19 (×6): qty 1

## 2023-08-19 MED ORDER — NOREPINEPHRINE 4 MG/250ML-% IV SOLN
INTRAVENOUS | Status: AC
  Filled 2023-08-19: qty 250

## 2023-08-19 NOTE — Consult Note (Signed)
Pharmacy Antibiotic Note  Andrea Sanford is a 53 y.o. female admitted on 08/17/2023 with pneumonia. PMH significant for HTN, T2DM, bipolar disorder, SUD, anxiety. Concern for aspiration pneumonia given clinical picture. Pharmacy has been consulted for Zosyn dosing.  Plan: Day 1 of antibiotics Start Zosyn 3.375 g IV Q8H Continue to monitor renal function and follow culture results   Height: 5\' 6"  (167.6 cm) Weight: 85.6 kg (188 lb 11.4 oz) IBW/kg (Calculated) : 59.3  Temp (24hrs), Avg:98.3 F (36.8 C), Min:96.8 F (36 C), Max:101.7 F (38.7 C)  Recent Labs  Lab 08/17/23 0415 08/17/23 0452 08/17/23 0511 08/17/23 0706 08/18/23 0426 08/19/23 0324  WBC 9.8  --   --   --  10.9* 16.2*  CREATININE QUANTITY NOT SUFFICIENT, UNABLE TO PERFORM TEST  --  0.84  --  0.73 0.56  LATICACIDVEN  --  0.9  --  <0.3*  --   --     Estimated Creatinine Clearance: 88.6 mL/min (by C-G formula based on SCr of 0.56 mg/dL).    No Known Allergies  Antimicrobials this admission: 1/25 Zosyn >>   Dose adjustments this admission: N/A  Microbiology results: 1/23 BCx: NG2D 1/25 BCx: ordered 1/23 MRSA PCR: negative  Thank you for allowing pharmacy to be a part of this patient's care.  Celene Squibb, PharmD Clinical Pharmacist 08/19/2023 4:32 PM

## 2023-08-19 NOTE — Progress Notes (Addendum)
   CHIEF COMPLAINT:   Chief Complaint  Patient presents with   Altered Mental Status   Respiratory Distress    Subjective  S/p extubation yesterday Signs of aspiration Increased WOB and worsening hypoxia Increased RR and HR HIGH RISK FOR DEATH  PLAN FOR EMERGENT INTUBATION      VITALS:  height is 5\' 6"  (1.676 m) and weight is 85.6 kg. Her axillary temperature is 96.8 F (36 C) (abnormal). Her blood pressure is 115/70 and her pulse is 65. Her respiration is 24 (abnormal) and oxygen saturation is 92%.      Assessment/Plan:  EMERGENT  INTUBATION FOR SEVERE RESP FAILURE DUE TO SEVERE DT'S  AND INABILITY TO PROTECT AIRWAY AND ACUTE ASPIRATION PNEUMONIA   Mother Marylouise Stacks was  called and notified of deadly situation.  ADDITIONAL Critical Care Time devoted to patient care services described in this note is 45  minutes.  Critical care was necessary to treat /prevent imminent and life-threatening deterioration. Overall, patient is critically ill, prognosis is guarded.  Patient with Multiorgan failure and at high risk for cardiac arrest and death.    Lucie Leather, M.D.  [facility]  Medical Director [facility] Medical Director [facility] Cardio-Pulmonary Department
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
## 2023-08-19 NOTE — Consult Note (Signed)
PHARMACY CONSULT NOTE - ELECTROLYTES  Pharmacy Consult for Electrolyte Monitoring and Replacement   Recent Labs: Potassium (mmol/L)  Date Value  08/19/2023 3.7  10/15/2012 3.0 (L)   Magnesium (mg/dL)  Date Value  16/04/9603 2.0  10/04/2011 1.4 (L)   Calcium (mg/dL)  Date Value  54/03/8118 9.5   Calcium, Total (mg/dL)  Date Value  14/78/2956 8.3 (L)   Albumin (g/dL)  Date Value  21/30/8657 3.7  06/13/2023 4.3  10/15/2012 3.9   Phosphorus (mg/dL)  Date Value  84/69/6295 3.6  08/19/2023 3.4   Sodium (mmol/L)  Date Value  08/19/2023 137  06/13/2023 142  10/15/2012 141   Height: 5\' 6"  (167.6 cm) Weight: 85.6 kg (188 lb 11.4 oz) IBW/kg (Calculated) : 59.3 Estimated Creatinine Clearance: 88.6 mL/min (by C-G formula based on SCr of 0.56 mg/dL).  Assessment  Andrea Sanford is Andrea 53 y.o. female presenting with drug overdose / intoxication. PMH significant for polysubstance abuse, anxiety / depression, asthma / COPD, bipolar disorder, DM, neuromuscular disorder. Pharmacy has been consulted to monitor and replace electrolytes.  Diet: NPO MIVF: N/Andrea Pertinent medications: N/Andrea  Goal of Therapy: Electrolytes within normal limits  Plan:  K 3.6, Kcl 40 mEq per tube x 1 dose Electrolytes with labs tomorrow AM  Thank you for allowing pharmacy to be Andrea part of this patient's care.  Andrea Sanford Andrea Sanford 08/19/2023 7:15 AM

## 2023-08-19 NOTE — Procedures (Signed)
Endotracheal Intubation: Patient required placement of an artificial airway secondary to Respiratory Failure  Consent: Emergent.   Hand washing performed prior to starting the procedure.   Medications administered for sedation prior to procedure:  Midazolam 4 mg IV,  Vecuronium 20 mg IV, Fentanyl 200 mcg IV.    A time out procedure was called and correct patient, name, & ID confirmed. Needed supplies and equipment were assembled and checked to include ETT, 10 ml syringe, Glidescope, Mac and Miller blades, suction, oxygen and bag mask valve, end tidal CO2 monitor.   Patient was positioned to align the mouth and pharynx to facilitate visualization of the glottis.   Heart rate, SpO2 and blood pressure was continuously monitored during the procedure. Pre-oxygenation was conducted prior to intubation and endotracheal tube was placed through the vocal cords into the trachea.     The artificial airway was placed under direct visualization via glidescope route using a 8.0 ETT on the first attempt.  ETT was secured at 23 cm mark.  Placement was confirmed by auscuitation of lungs with good breath sounds bilaterally and no stomach sounds.  Condensation was noted on endotracheal tube.   Pulse ox 98%.  CO2 detector in place with appropriate color change.   Complications: None .   Operator: Allayah Raineri.   Chest radiograph ordered and pending.     Lucie Leather, M.D.  [facility]  Medical Director [facility] Medical Director [facility] Cardio-Pulmonary Department

## 2023-08-20 DIAGNOSIS — F10920 Alcohol use, unspecified with intoxication, uncomplicated: Secondary | ICD-10-CM | POA: Diagnosis not present

## 2023-08-20 DIAGNOSIS — J9600 Acute respiratory failure, unspecified whether with hypoxia or hypercapnia: Secondary | ICD-10-CM | POA: Diagnosis not present

## 2023-08-20 DIAGNOSIS — R4182 Altered mental status, unspecified: Secondary | ICD-10-CM | POA: Diagnosis not present

## 2023-08-20 DIAGNOSIS — F1490 Cocaine use, unspecified, uncomplicated: Secondary | ICD-10-CM | POA: Diagnosis not present

## 2023-08-20 LAB — RESPIRATORY PANEL BY PCR

## 2023-08-20 LAB — RENAL FUNCTION PANEL
Albumin: 2.9 g/dL — ABNORMAL LOW (ref 3.5–5.0)
Anion gap: 10 (ref 5–15)
BUN: 33 mg/dL — ABNORMAL HIGH (ref 6–20)
CO2: 22 mmol/L (ref 22–32)
Calcium: 8.3 mg/dL — ABNORMAL LOW (ref 8.9–10.3)
Chloride: 107 mmol/L (ref 98–111)
Creatinine, Ser: 1.39 mg/dL — ABNORMAL HIGH (ref 0.44–1.00)
GFR, Estimated: 45 mL/min — ABNORMAL LOW (ref >60)
Glucose, Bld: 177 mg/dL — ABNORMAL HIGH (ref 70–99)
Phosphorus: 3.5 mg/dL (ref 2.5–4.6)
Potassium: 4.1 mmol/L (ref 3.5–5.1)
Sodium: 139 mmol/L (ref 135–145)

## 2023-08-20 LAB — GLUCOSE, CAPILLARY
Glucose-Capillary: 148 mg/dL — ABNORMAL HIGH (ref 70–99)
Glucose-Capillary: 151 mg/dL — ABNORMAL HIGH (ref 70–99)
Glucose-Capillary: 107 mg/dL — ABNORMAL HIGH (ref 70–99)
Glucose-Capillary: 128 mg/dL — ABNORMAL HIGH (ref 70–99)
Glucose-Capillary: 90 mg/dL (ref 70–99)
Glucose-Capillary: 77 mg/dL (ref 70–99)

## 2023-08-20 LAB — CBC
HCT: 37.6 % (ref 36.0–46.0)
Hemoglobin: 12.3 g/dL (ref 12.0–15.0)
MCH: 30.2 pg (ref 26.0–34.0)
MCHC: 32.7 g/dL (ref 30.0–36.0)
MCV: 92.4 fL (ref 80.0–100.0)
Platelets: 233 10*3/uL (ref 150–400)
RBC: 4.07 MIL/uL (ref 3.87–5.11)
RDW: 12.4 % (ref 11.5–15.5)
WBC: 22.4 10*3/uL — ABNORMAL HIGH (ref 4.0–10.5)
nRBC: 0 % (ref 0.0–0.2)

## 2023-08-20 LAB — TRIGLYCERIDES: Triglycerides: 154 mg/dL — ABNORMAL HIGH (ref <150)

## 2023-08-20 MED ORDER — LACTATED RINGERS IV BOLUS
1000.0000 mL | Freq: Once | INTRAVENOUS | Status: AC
  Administered 2023-08-19: 1000 mL via INTRAVENOUS

## 2023-08-20 MED ORDER — SODIUM CHLORIDE 0.9% FLUSH: 10.0000 mL | INTRAVENOUS | Status: DC | PRN

## 2023-08-20 MED ORDER — SODIUM CHLORIDE 0.9% FLUSH
10.0000 mL | Freq: Two times a day (BID) | INTRAVENOUS | Status: DC
  Administered 2023-08-20 – 2023-08-21 (×3): 10 mL
  Administered 2023-08-22: 20 mL
  Administered 2023-08-22 – 2023-09-03 (×24): 10 mL
  Administered 2023-09-04: 30 mL
  Administered 2023-09-04 – 2023-09-15 (×21): 10 mL

## 2023-08-20 MED ORDER — SODIUM CHLORIDE 0.9 % IV BOLUS
1000.0000 mL | Freq: Once | INTRAVENOUS | Status: AC
  Administered 2023-08-20: 1000 mL via INTRAVENOUS

## 2023-08-20 MED ORDER — DEXTROSE-SODIUM CHLORIDE 5-0.45 % IV SOLN: INTRAVENOUS | Status: DC

## 2023-08-20 MED ORDER — MIDODRINE HCL 5 MG PO TABS
10.0000 mg | ORAL_TABLET | Freq: Three times a day (TID) | ORAL | Status: DC
  Administered 2023-08-20 – 2023-08-21 (×3): 10 mg
  Filled 2023-08-20 (×3): qty 2

## 2023-08-20 MED ORDER — SODIUM CHLORIDE 0.9 % IV SOLN: INTRAVENOUS | Status: DC

## 2023-08-20 NOTE — Progress Notes (Signed)
Peripherally Inserted Central Catheter Placement  The IV Nurse has discussed with the patient and/or persons authorized to consent for the patient, the purpose of this procedure and the potential benefits and risks involved with this procedure.  The benefits include less needle sticks, lab draws from the catheter, and the patient may be discharged home with the catheter. Risks include, but not limited to, infection, bleeding, blood clot (thrombus formation), and puncture of an artery; nerve damage and irregular heartbeat and possibility to perform a PICC exchange if needed/ordered by physician.  Alternatives to this procedure were also discussed.  Bard Power PICC patient education guide, fact sheet on infection prevention and patient information card has been provided to patient /or left at bedside.   Telephone consent obtained from mother. PICC Placement Documentation  PICC Triple Lumen 08/20/23 Right Basilic 39 cm 0 cm (Active)  Indication for Insertion or Continuance of Line Vasoactive infusions 08/20/23 1029  Exposed Catheter (cm) 0 cm 08/20/23 1029  Site Assessment Clean, Dry, Intact 08/20/23 1029  Lumen #1 Status Saline locked;Flushed;Blood return noted 08/20/23 1029  Lumen #2 Status Flushed;Saline locked;Blood return noted 08/20/23 1029  Lumen #3 Status Flushed;Saline locked;Blood return noted 08/20/23 1029  Dressing Type Transparent;Securing device 08/20/23 1029  Dressing Status Antimicrobial disc/dressing in place;Clean, Dry, Intact 08/20/23 1029  Line Care Connections checked and tightened 08/20/23 1029  Line Adjustment (NICU/IV Team Only) No 08/20/23 1029  Dressing Intervention New dressing;Adhesive placed at insertion site (IV team only);Adhesive placed around edges of dressing (IV team/ICU RN only) 08/20/23 1029  Dressing Change Due 08/27/23 08/20/23 1029       Andrea Sanford 08/20/2023, 10:30 AM

## 2023-08-20 NOTE — Plan of Care (Signed)
  Problem: Coping: Goal: Ability to adjust to condition or change in health will improve Outcome: Progressing   Problem: Fluid Volume: Goal: Ability to maintain a balanced intake and output will improve Outcome: Progressing   Problem: Metabolic: Goal: Ability to maintain appropriate glucose levels will improve Outcome: Progressing   Problem: Skin Integrity: Goal: Risk for impaired skin integrity will decrease Outcome: Progressing   Problem: Clinical Measurements: Goal: Ability to maintain clinical measurements within normal limits will improve Outcome: Progressing

## 2023-08-20 NOTE — Consult Note (Signed)
PHARMACY CONSULT NOTE - ELECTROLYTES  Pharmacy Consult for Electrolyte Monitoring and Replacement   Recent Labs: Potassium (mmol/L)  Date Value  08/20/2023 4.1  10/15/2012 3.0 (L)   Magnesium (mg/dL)  Date Value  09/81/1914 1.8  10/04/2011 1.4 (L)   Calcium (mg/dL)  Date Value  78/29/5621 8.3 (L)   Calcium, Total (mg/dL)  Date Value  30/86/5784 8.3 (L)   Albumin (g/dL)  Date Value  69/62/9528 2.9 (L)  06/13/2023 4.3  10/15/2012 3.9   Phosphorus (mg/dL)  Date Value  41/32/4401 3.5   Sodium (mmol/L)  Date Value  08/20/2023 139  06/13/2023 142  10/15/2012 141   Height: 5\' 6"  (167.6 cm) Weight: 85.6 kg (188 lb 11.4 oz) IBW/kg (Calculated) : 59.3 Estimated Creatinine Clearance: 51 mL/min (A) (by C-G formula based on SCr of 1.39 mg/dL (H)).  Assessment  Andrea Sanford is a 53 y.o. female presenting with drug overdose / intoxication. PMH significant for polysubstance abuse, anxiety / depression, asthma / COPD, bipolar disorder, DM, neuromuscular disorder. Pharmacy has been consulted to monitor and replace electrolytes.  Diet: NPO MIVF: NS@100ml /hr Pertinent medications: N/A  Goal of Therapy: Electrolytes within normal limits  Plan:  No replacement indicated for today Electrolytes with labs tomorrow AM  Thank you for allowing pharmacy to be a part of this patient's care.  Rolinda Impson A Neely Kammerer 08/20/2023 8:11 AM
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
## 2023-08-20 NOTE — Plan of Care (Signed)
Problem: Education: Goal: Ability to describe self-care measures that may prevent or decrease complications (Diabetes Survival Skills Education) will improve Outcome: Progressing Goal: Individualized Educational Video(s) Outcome: Progressing   Problem: Coping: Goal: Ability to adjust to condition or change in health will improve Outcome: Progressing   Problem: Fluid Volume: Goal: Ability to maintain a balanced intake and output will improve Outcome: Progressing   Problem: Health Behavior/Discharge Planning: Goal: Ability to identify and utilize available resources and services will improve Outcome: Progressing Goal: Ability to manage health-related needs will improve Outcome: Progressing   Problem: Metabolic: Goal: Ability to maintain appropriate glucose levels will improve Outcome: Progressing   Problem: Nutritional: Goal: Maintenance of adequate nutrition will improve Outcome: Progressing Goal: Progress toward achieving an optimal weight will improve Outcome: Progressing   Problem: Skin Integrity: Goal: Risk for impaired skin integrity will decrease Outcome: Progressing   Problem: Tissue Perfusion: Goal: Adequacy of tissue perfusion will improve Outcome: Progressing   Problem: Education: Goal: Knowledge of General Education information will improve Description: Including pain rating scale, medication(s)/side effects and non-pharmacologic comfort measures Outcome: Progressing   Problem: Clinical Measurements: Goal: Ability to maintain clinical measurements within normal limits will improve Outcome: Progressing Goal: Will remain free from infection Outcome: Progressing Goal: Diagnostic test results will improve Outcome: Progressing Goal: Respiratory complications will improve Outcome: Progressing Goal: Cardiovascular complication will be avoided Outcome: Progressing

## 2023-08-21 LAB — BASIC METABOLIC PANEL
Anion gap: 6 (ref 5–15)
BUN: 16 mg/dL (ref 6–20)
CO2: 23 mmol/L (ref 22–32)
Calcium: 7.8 mg/dL — ABNORMAL LOW (ref 8.9–10.3)
Chloride: 112 mmol/L — ABNORMAL HIGH (ref 98–111)
Creatinine, Ser: 0.76 mg/dL (ref 0.44–1.00)
GFR, Estimated: >60 mL/min (ref >60)
Glucose, Bld: 127 mg/dL — ABNORMAL HIGH (ref 70–99)
Potassium: 3.2 mmol/L — ABNORMAL LOW (ref 3.5–5.1)
Sodium: 141 mmol/L (ref 135–145)

## 2023-08-21 LAB — RENAL FUNCTION PANEL
Albumin: 2.2 g/dL — ABNORMAL LOW (ref 3.5–5.0)
Anion gap: 6 (ref 5–15)
BUN: 15 mg/dL (ref 6–20)
CO2: 22 mmol/L (ref 22–32)
Calcium: 6.9 mg/dL — ABNORMAL LOW (ref 8.9–10.3)
Chloride: 105 mmol/L (ref 98–111)
Creatinine, Ser: 0.72 mg/dL (ref 0.44–1.00)
GFR, Estimated: >60 mL/min (ref >60)
Glucose, Bld: 537 mg/dL (ref 70–99)
Phosphorus: 1.6 mg/dL — ABNORMAL LOW (ref 2.5–4.6)
Potassium: 2.8 mmol/L — ABNORMAL LOW (ref 3.5–5.1)
Sodium: 133 mmol/L — ABNORMAL LOW (ref 135–145)

## 2023-08-21 LAB — GLUCOSE, CAPILLARY
Glucose-Capillary: 84 mg/dL (ref 70–99)
Glucose-Capillary: 59 mg/dL — ABNORMAL LOW (ref 70–99)
Glucose-Capillary: 89 mg/dL (ref 70–99)
Glucose-Capillary: 102 mg/dL — ABNORMAL HIGH (ref 70–99)
Glucose-Capillary: 132 mg/dL — ABNORMAL HIGH (ref 70–99)
Glucose-Capillary: 133 mg/dL — ABNORMAL HIGH (ref 70–99)
Glucose-Capillary: 112 mg/dL — ABNORMAL HIGH (ref 70–99)
Glucose-Capillary: 91 mg/dL (ref 70–99)

## 2023-08-21 LAB — CBC
HCT: 29.5 % — ABNORMAL LOW (ref 36.0–46.0)
Hemoglobin: 9.6 g/dL — ABNORMAL LOW (ref 12.0–15.0)
MCH: 30.3 pg (ref 26.0–34.0)
MCHC: 32.5 g/dL (ref 30.0–36.0)
MCV: 93.1 fL (ref 80.0–100.0)
Platelets: 174 10*3/uL (ref 150–400)
RBC: 3.17 MIL/uL — ABNORMAL LOW (ref 3.87–5.11)
RDW: 13.1 % (ref 11.5–15.5)
WBC: 9.4 10*3/uL (ref 4.0–10.5)
nRBC: 0 % (ref 0.0–0.2)

## 2023-08-21 LAB — PHOSPHORUS
Phosphorus: 15.1 mg/dL — ABNORMAL HIGH (ref 2.5–4.6)
Phosphorus: 3.4 mg/dL (ref 2.5–4.6)

## 2023-08-21 LAB — MAGNESIUM
Magnesium: 1.7 mg/dL (ref 1.7–2.4)
Magnesium: 1.7 mg/dL (ref 1.7–2.4)

## 2023-08-21 LAB — POTASSIUM: Potassium: 3.3 mmol/L — ABNORMAL LOW (ref 3.5–5.1)

## 2023-08-21 MED ORDER — VITAL AF 1.2 CAL PO LIQD
1000.0000 mL | ORAL | Status: DC
  Administered 2023-08-21: 1000 mL

## 2023-08-21 MED ORDER — MIDODRINE HCL 5 MG PO TABS
5.0000 mg | ORAL_TABLET | Freq: Three times a day (TID) | ORAL | Status: DC
Start: 2023-08-21 — End: 2023-08-24
  Administered 2023-08-21 – 2023-08-22 (×5): 5 mg
  Filled 2023-08-21 (×7): qty 1

## 2023-08-21 MED ORDER — LACTATED RINGERS IV BOLUS
1000.0000 mL | Freq: Once | INTRAVENOUS | Status: AC
  Administered 2023-08-21: 1000 mL via INTRAVENOUS

## 2023-08-21 MED ORDER — MAGNESIUM SULFATE 2 GM/50ML IV SOLN
2.0000 g | Freq: Once | INTRAVENOUS | Status: AC
Start: 2023-08-21 — End: 2023-08-21
  Administered 2023-08-21: 2 g via INTRAVENOUS
  Filled 2023-08-21: qty 50

## 2023-08-21 MED ORDER — POTASSIUM CHLORIDE 10 MEQ/50ML IV SOLN
10.0000 meq | INTRAVENOUS | Status: AC
  Administered 2023-08-21 – 2023-08-22 (×2): 10 meq via INTRAVENOUS
  Filled 2023-08-21 (×2): qty 50

## 2023-08-21 MED ORDER — MAGNESIUM SULFATE 2 GM/50ML IV SOLN
2.0000 g | Freq: Once | INTRAVENOUS | Status: AC
  Administered 2023-08-21: 2 g via INTRAVENOUS
  Filled 2023-08-21: qty 50

## 2023-08-21 MED ORDER — JUVEN PO PACK
1.0000 | PACK | Freq: Two times a day (BID) | ORAL | Status: DC
Start: 2023-08-22 — End: 2023-09-14
  Administered 2023-08-22 – 2023-09-14 (×45): 1

## 2023-08-21 MED ORDER — POTASSIUM CHLORIDE 20 MEQ PO PACK
40.0000 meq | PACK | Freq: Once | ORAL | Status: AC
  Administered 2023-08-21: 40 meq
  Filled 2023-08-21: qty 2

## 2023-08-21 MED ORDER — POTASSIUM PHOSPHATES 15 MMOLE/5ML IV SOLN
30.0000 mmol | Freq: Once | INTRAVENOUS | Status: AC
  Administered 2023-08-21: 30 mmol via INTRAVENOUS
  Filled 2023-08-21: qty 10

## 2023-08-21 MED ORDER — AMOXICILLIN-POT CLAVULANATE 400-57 MG/5ML PO SUSR
875.0000 mg | Freq: Two times a day (BID) | ORAL | Status: DC
  Administered 2023-08-21 – 2023-08-23 (×4): 875 mg
  Filled 2023-08-21 (×4): qty 10.94

## 2023-08-21 NOTE — Progress Notes (Signed)
CRITICAL CARE PROGRESS NOTE    Name: Andrea Sanford MRN: [id number] DOB: February 01, 1971     LOS: 4   SUBJECTIVE FINDINGS & SIGNIFICANT EVENTS    History of Presenting Illness:  -patient with polysubstance abuse including drugs and alcohol.  She was found unresponsive.  She has PMH of psychiatric conditions, lifelong smoking, homelesness, COPD, anxiety and depression.    ED course: Upon arrival patient obtunded, tachypneic, hypothermic and hypertensive being supported with a BVM. Patient emergently intubated for airway protection in the setting of suspected drug overdose, becoming hypotensive after induction requiring vasopressor support. Labs significant for hypokalemia and mild NAGMA.   Imaging revealed vascular congestion on CXR with no acute abnormality on CTH.  Medications given: etomidate & succinylcholine, 3 L bolus, levophed drip started Initial Vitals: 95.1, 22, 95, 168/138, 90% BVM Significant labs: (Labs/ Imaging personally reviewed) I, [name], AGACNP-BC, personally viewed and interpreted this ECG. EKG Interpretation: Date: 08/17/23, EKG Time: 04:45, Rate: 77, Rhythm: NSR, QRS Axis:  normal, Intervals: borderline prolonged Qtc, ST/T Wave abnormalities: very mild STE in leads I, II and avL (does not meet STEMI criteria), Narrative Interpretation: NSR Chemistry: Na+:138, K+: 3.1, BUN/Cr.: 11/0.84, Serum CO2/ AG: 20/ 11 Hematology: WBC: 9.8, Hgb: 13.6,  Troponin: 6, BNP: pending, Lactic/ PCT: 0.9/pending,  COVID-19 & Influenza A/B: pending   Alcohol level: 190, Acetaminophen: <10, Salicylate: <7 UDS: +amphetamines, marijuana, cocaine   ABG: 7.27/49/173/22.5 CXR 08/17/23:  Low volume film with vascular congestion. Large gastric bubble despite the presence of an NG tube. CT head wo contrast  08/17/23: No acute intracranial abnormality identified. Mild but progressed cerebral white matter changes since 2019, most commonly due to small vessel disease. Chronic left facial fractures.   08/21/23- patient with heavy dark inspissated ETT secretions with bronchospasm. RT worried about increased resistance on MV due to thick resp mucus. We plan to perform bronchoscopy I reviewed medical findings with [place] mother of patient .   Lines/tubes : Airway 8 mm (Active)  Secured at (cm) 22 cm 08/21/23 0826  Measured From Lips 08/21/23 0826  Secured Location Left 08/21/23 0826  Secured By [employer] 08/21/23 0826  Bite Block No 08/21/23 0826  Tube Holder Repositioned Yes 08/21/23 0826  Prone position No 08/21/23 0826  Cuff Pressure (cm H2O) Green OR 18-26 [facility] 08/21/23 0826  Site Condition Dry 08/21/23 0826     PICC Triple Lumen 08/20/23 Right Basilic 39 cm 0 cm (Active)  Indication for Insertion or Continuance of Line Vasoactive infusions 08/21/23 0733  Exposed Catheter (cm) 0 cm 08/21/23 0733  Site Assessment Clean, Dry, Intact 08/21/23 0733  Lumen #1 Status In-line blood sampling system in place 08/21/23 0733  Lumen #2 Status Flushed;Infusing;Blood return noted 08/21/23 0733  Lumen #3 Status Infusing;Flushed;Blood return noted 08/21/23 1324  Dressing Type Transparent;Securing device 08/21/23 4010  Dressing Status Antimicrobial disc/dressing in place 08/21/23 2725  Line Care Connections checked and tightened 08/21/23 3664  Line Adjustment (NICU/IV Team Only) No 08/20/23 1029  Dressing Intervention New dressing;Adhesive placed at insertion site (IV team only);Adhesive placed around edges of dressing (IV team/ICU RN only) 08/20/23 1029  Dressing Change Due 08/27/23 08/21/23 0733     NG/OG Vented/Dual Lumen 14 Fr. Right nare Marking at nare/corner of mouth 70 cm (Active)  Tube Position (Required) Marking at nare/corner of mouth 08/21/23 0736  Measurement (cm) (Required)  70 cm 08/21/23 0736  Ongoing Placement Verification (Required) (See row information) Yes 08/21/23 0736  Site Assessment Clean, Dry,  Intact 08/21/23 0736  Interventions Clamped 08/21/23 0736  Status Clamped 08/21/23 0736  Intake (mL) 30 mL 08/21/23 0736  Output (mL) 0 mL 08/21/23 0600     Urethral Catheter C. Vazquez Non-latex 14 Fr. (Active)  Indication for Insertion or Continuance of Catheter Acute urinary retention (I&O Cath for 24 hrs prior to catheter insertion- Inpatient Only) 08/21/23 0736  Site Assessment Clean, Dry, Intact 08/21/23 0736  Catheter Maintenance Bag below level of bladder;Catheter secured;Drainage bag/tubing not touching floor;Insertion date on drainage bag;No dependent loops;Seal intact;Bag emptied prior to transport 08/21/23 0736  Collection Container Standard drainage bag 08/21/23 0736  Securement Method Adhesive securement device 08/21/23 0736  Output (mL) 70 mL 08/21/23 0600    Microbiology/Sepsis markers: Results for orders placed or performed during the hospital encounter of 08/17/23  Culture, blood (routine x 2)     Status: None (Preliminary result)   Collection Time: 08/17/23  4:52 AM   Specimen: BLOOD  Result Value Ref Range Status   Specimen Description BLOOD LEFT ANTECUBITAL  Final   Special Requests   Final    BOTTLES DRAWN AEROBIC AND ANAEROBIC Blood Culture results may not be optimal due to an excessive volume of blood received in culture bottles   Culture   Final    NO GROWTH 4 DAYS Performed at [facility], [address] [place], [place] [address]    Report Status PENDING  Incomplete  Culture, blood (routine x 2)     Status: None (Preliminary result)   Collection Time: 08/17/23  4:52 AM   Specimen: BLOOD  Result Value Ref Range Status   Specimen Description BLOOD BLOOD LEFT HAND  Final   Special Requests   Final    BOTTLES DRAWN AEROBIC AND ANAEROBIC Blood Culture adequate volume   Culture   Final    NO GROWTH 4 DAYS Performed  at [facility], [address] [place], [place] [address]    Report Status PENDING  Incomplete  Resp panel by RT-PCR (RSV, Flu A&B, Covid) Anterior Nasal Swab     Status: None   Collection Time: 08/17/23  5:11 AM   Specimen: Anterior Nasal Swab  Result Value Ref Range Status   SARS Coronavirus 2 by RT PCR NEGATIVE NEGATIVE Final    Comment: (NOTE) SARS-CoV-2 target nucleic acids are NOT DETECTED.  The SARS-CoV-2 RNA is generally detectable in upper respiratory specimens during the acute phase of infection. The lowest concentration of SARS-CoV-2 viral copies this assay can detect is 138 copies/mL. A negative result does not preclude SARS-Cov-2 infection and should not be used as the sole basis for treatment or other patient management decisions. A negative result may occur with  improper specimen collection/handling, submission of specimen other than nasopharyngeal swab, presence of viral mutation(s) within the areas targeted by this assay, and inadequate number of viral copies(<138 copies/mL). A negative result must be combined with clinical observations, patient history, and epidemiological information. The expected result is Negative.  Fact Sheet for Patients:  BloggerCourse.com  Fact Sheet for Healthcare Providers:  SeriousBroker.it  This test is no t yet approved or cleared by the [place] FDA and  has been authorized for detection and/or diagnosis of SARS-CoV-2 by FDA under an Emergency Use Authorization (EUA). This EUA will remain  in effect (meaning this test can be used) for the duration of the COVID-19 declaration under Section 564(b)(1) of the Act, 21 U.S.C.section 360bbb-3(b)(1), unless the authorization is terminated  or revoked sooner.  Influenza A by PCR NEGATIVE NEGATIVE Final   Influenza B by PCR NEGATIVE NEGATIVE Final    Comment: (NOTE) The Xpert Xpress SARS-CoV-2/FLU/RSV plus assay is  intended as an aid in the diagnosis of influenza from Nasopharyngeal swab specimens and should not be used as a sole basis for treatment. Nasal washings and aspirates are unacceptable for Xpert Xpress SARS-CoV-2/FLU/RSV testing.  Fact Sheet for Patients: BloggerCourse.com  Fact Sheet for Healthcare Providers: SeriousBroker.it  This test is not yet approved or cleared by the [place] FDA and has been authorized for detection and/or diagnosis of SARS-CoV-2 by FDA under an Emergency Use Authorization (EUA). This EUA will remain in effect (meaning this test can be used) for the duration of the COVID-19 declaration under Section 564(b)(1) of the Act, 21 U.S.C. section 360bbb-3(b)(1), unless the authorization is terminated or revoked.     Resp Syncytial Virus by PCR NEGATIVE NEGATIVE Final    Comment: (NOTE) Fact Sheet for Patients: BloggerCourse.com  Fact Sheet for Healthcare Providers: SeriousBroker.it  This test is not yet approved or cleared by the [place] FDA and has been authorized for detection and/or diagnosis of SARS-CoV-2 by FDA under an Emergency Use Authorization (EUA). This EUA will remain in effect (meaning this test can be used) for the duration of the COVID-19 declaration under Section 564(b)(1) of the Act, 21 U.S.C. section 360bbb-3(b)(1), unless the authorization is terminated or revoked.  Performed at [facility], [address] [place], [place] [address]   MRSA Next Gen by PCR, Nasal     Status: None   Collection Time: 08/17/23  6:00 AM   Specimen: Nasal Mucosa; Nasal Swab  Result Value Ref Range Status   MRSA by PCR Next Gen NOT DETECTED NOT DETECTED Final    Comment: (NOTE) The GeneXpert MRSA Assay (FDA approved for NASAL specimens only), is one component of a comprehensive MRSA colonization surveillance program. It is not intended to  diagnose MRSA infection nor to guide or monitor treatment for MRSA infections. Test performance is not FDA approved in patients less than 44 years old. Performed at [facility], [address] [place], [place] [address]   Culture, blood (Routine X 2) w Reflex to ID Panel     Status: None (Preliminary result)   Collection Time: 08/19/23  4:23 PM   Specimen: BLOOD  Result Value Ref Range Status   Specimen Description BLOOD BLOOD RIGHT ARM AEROBIC BOTTLE ONLY  Final   Special Requests   Final    BOTTLES DRAWN AEROBIC ONLY Blood Culture results may not be optimal due to an inadequate volume of blood received in culture bottles   Culture   Final    NO GROWTH 2 DAYS Performed at [facility], [address] [place], [place] [address]    Report Status PENDING  Incomplete  Culture, blood (Routine X 2) w Reflex to ID Panel     Status: None (Preliminary result)   Collection Time: 08/19/23  4:23 PM   Specimen: BLOOD  Result Value Ref Range Status   Specimen Description BLOOD BLOOD RIGHT HAND AEROBIC BOTTLE ONLY  Final   Special Requests   Final    BOTTLES DRAWN AEROBIC ONLY Blood Culture results may not be optimal due to an inadequate volume of blood received in culture bottles   Culture   Final    NO GROWTH 2 DAYS Performed at [facility], [address], [place], [place] [address]    Report Status PENDING  Incomplete  Respiratory (~20 pathogens) panel by PCR     Status: None   Collection Time: 08/20/23 11:04 AM   Specimen: Nasopharyngeal Swab; Respiratory  Result Value Ref Range Status   Adenovirus NOT DETECTED NOT DETECTED Final   Coronavirus 229E NOT DETECTED NOT DETECTED Final    Comment: (NOTE) The Coronavirus on the Respiratory Panel, DOES NOT test for the novel  Coronavirus (2019 nCoV)    Coronavirus HKU1 NOT DETECTED NOT DETECTED Final   Coronavirus NL63 NOT DETECTED NOT DETECTED Final   Coronavirus OC43 NOT DETECTED NOT DETECTED Final    Metapneumovirus NOT DETECTED NOT DETECTED Final   Rhinovirus / Enterovirus NOT DETECTED NOT DETECTED Final   Influenza A NOT DETECTED NOT DETECTED Final   Influenza B NOT DETECTED NOT DETECTED Final   Parainfluenza Virus 1 NOT DETECTED NOT DETECTED Final   Parainfluenza Virus 2 NOT DETECTED NOT DETECTED Final   Parainfluenza Virus 3 NOT DETECTED NOT DETECTED Final   Parainfluenza Virus 4 NOT DETECTED NOT DETECTED Final   Respiratory Syncytial Virus NOT DETECTED NOT DETECTED Final   Bordetella pertussis NOT DETECTED NOT DETECTED Final   Bordetella Parapertussis NOT DETECTED NOT DETECTED Final   Chlamydophila pneumoniae NOT DETECTED NOT DETECTED Final   Mycoplasma pneumoniae NOT DETECTED NOT DETECTED Final    Comment: Performed at [facility], [address], [place], [place] [address]  Culture, Respiratory w Gram Stain     Status: None (Preliminary result)   Collection Time: 08/20/23  1:56 PM   Specimen: Tracheal Aspirate; Respiratory  Result Value Ref Range Status   Specimen Description   Final    TRACHEAL ASPIRATE Performed at [facility], [address], [place], [place] [address]    Special Requests   Final    NONE Performed at [facility], [address] [place], [place] [address]    Gram Stain   Final    FEW WBC PRESENT, PREDOMINANTLY PMN RARE GRAM POSITIVE COCCI Performed at [facility], [address], [place], [place] [address]    Culture PENDING  Incomplete   Report Status PENDING  Incomplete    Anti-infectives:  Anti-infectives (From admission, onward)    Start     Dose/Rate Route Frequency Ordered Stop   08/19/23 1730  piperacillin-tazobactam (ZOSYN) IVPB 3.375 g        3.375 g 12.5 mL/hr over 240 Minutes Intravenous Every 8 hours 08/19/23 1633           PAST MEDICAL HISTORY   Past Medical History:  Diagnosis Date   Allergy    Anxiety    Arthritis    Asthma    Bipolar 1 disorder (HCC)    Cancer (HCC)    COPD  (chronic obstructive pulmonary disease) (HCC)    Depression    Diabetes mellitus without complication (HCC)    Hypertension    Neuromuscular disorder (HCC)    Osteoporosis    Sleep apnea      SURGICAL HISTORY   Past Surgical History:  Procedure Laterality Date   ANKLE SURGERY Right    PERIPHERAL VASCULAR THROMBECTOMY Left 1991     FAMILY HISTORY   Family History  Problem Relation Age of Onset   Depression Mother    Diabetes Mother    Cancer Father      SOCIAL HISTORY   Social History   Tobacco Use   Smoking status: Every Day    Current packs/day: 1.00    Types: Cigarettes  Smokeless tobacco: Never  Substance Use Topics   Alcohol use: Not Currently    Alcohol/week: 2.0 standard drinks of alcohol    Types: 2 Standard drinks or equivalent per week   Drug use: Yes    Types: Cocaine    Comment: uses on occasions     MEDICATIONS   Current Medication:  Current Facility-Administered Medications:    acetaminophen (TYLENOL) tablet 650 mg, 650 mg, Per Tube, Q4H PRN, Rust-Chester, Micheline Rough L, NP, 650 mg at 08/20/23 1204   Chlorhexidine Gluconate Cloth 2 % PADS 6 each, 6 each, Topical, Daily, Kasa, Wallis Bamberg, MD, 6 each at 08/20/23 1058   dexmedetomidine (PRECEDEX) 400 MCG/100ML (4 mcg/mL) infusion, 0-1.2 mcg/kg/hr, Intravenous, Continuous, Rust-Chester, Cecelia Byars, NP, Stopped at 08/19/23 1423   dextrose 5 % and 0.45 % NaCl infusion, , Intravenous, Continuous, Ouma, Hubbard Hartshorn, NP, Last Rate: 100 mL/hr at 08/21/23 0943, New Bag at 08/21/23 0943   docusate (COLACE) 50 MG/5ML liquid 100 mg, 100 mg, Per Tube, BID PRN, Rust-Chester, Cecelia Byars, NP   docusate (COLACE) 50 MG/5ML liquid 100 mg, 100 mg, Per Tube, BID, Belia Heman, Wallis Bamberg, MD, 100 mg at 08/21/23 0815   enoxaparin (LOVENOX) injection 40 mg, 40 mg, Subcutaneous, Q24H, Tressie Ellis, RPH, 40 mg at 08/21/23 0839   famotidine (PEPCID) tablet 20 mg, 20 mg, Per Tube, BID, Erin Fulling, MD, 20 mg at 08/21/23 0814    feeding supplement (OSMOLITE 1.5 CAL) liquid 1,000 mL, 1,000 mL, Per Tube, Continuous, Erin Fulling, MD, Held at 08/18/23 1650   feeding supplement (PROSource TF20) liquid 60 mL, 60 mL, Per Tube, Daily, Erin Fulling, MD, 60 mL at 08/21/23 0815   fentaNYL (SUBLIMAZE) bolus via infusion 50-100 mcg, 50-100 mcg, Intravenous, Q15 min PRN, Erin Fulling, MD, 100 mcg at 08/19/23 1757   fentaNYL (SUBLIMAZE) injection 50 mcg, 50 mcg, Intravenous, Once, Erin Fulling, MD   fentaNYL in NS (4mcg/ml) infusion-PREMIX, 50-200 mcg/hr, Intravenous, Continuous, Kasa, Wallis Bamberg, MD, Stopped at 08/21/23 0827   folic acid (FOLVITE) tablet 1 mg, 1 mg, Per Tube, Daily, Rust-Chester, Micheline Rough L, NP, 1 mg at 08/21/23 0814   free water 30 mL, 30 mL, Per Tube, Q4H, Kasa, Wallis Bamberg, MD, 30 mL at 08/21/23 0808   influenza vac split trivalent PF (FLULAVAL) injection 0.5 mL, 0.5 mL, Intramuscular, Tomorrow-1000, Kasa, Kurian, MD   insulin aspart (novoLOG) injection 0-9 Units, 0-9 Units, Subcutaneous, Q4H, Rust-Chester, Britton L, NP, 1 Units at 08/20/23 1637   ipratropium-albuterol (DUONEB) 0.5-2.5 (3) MG/3ML nebulizer solution 3 mL, 3 mL, Nebulization, Q4H PRN, Rust-Chester, Britton L, NP, 3 mL at 08/18/23 1118   LORazepam (ATIVAN) injection 1 mg, 1 mg, Intravenous, Q4H PRN, Belia Heman, Kurian, MD, 1 mg at 08/19/23 1350   midodrine (PROAMATINE) tablet 10 mg, 10 mg, Per Tube, TID WC, Harlon Ditty D, NP, 10 mg at 08/21/23 4098   multivitamin with minerals tablet 1 tablet, 1 tablet, Per Tube, Daily, Rust-Chester, Micheline Rough L, NP, 1 tablet at 08/21/23 1191   norepinephrine (LEVOPHED) 4mg  in (0.016 mg/mL) premix infusion, 2-10 mcg/min, Intravenous, Titrated, Kasa, Kurian, MD, Last Rate: 11.25 mL/hr at 08/21/23 0900, 3 mcg/min at 08/21/23 0900   Oral care mouth rinse, 15 mL, Mouth Rinse, Q2H, Kasa, Kurian, MD, 15 mL at 08/21/23 0944   pantoprazole (PROTONIX) injection 40 mg, 40 mg, Intravenous, Q24H, Kasa, Kurian, MD, 40 mg at  08/20/23 1448   [COMPLETED] PHENObarbital (LUMINAL) injection 97.5 mg, 97.5 mg, Intravenous, Q8H, 97.5 mg at 08/21/23 0455 **FOLLOWED BY** PHENObarbital (LUMINAL)  injection 65 mg, 65 mg, Intravenous, Q8H **FOLLOWED BY** [START ON 08/23/2023] PHENObarbital (LUMINAL) injection 32.5 mg, 32.5 mg, Intravenous, Q8H, Greenwood, Howard F, RPH   piperacillin-tazobactam (ZOSYN) IVPB 3.375 g, 3.375 g, Intravenous, Q8H, Coulter, Carolyn, RPH, Last Rate: 12.5 mL/hr at 08/21/23 0900, Infusion Verify at 08/21/23 0900   pneumococcal 20-valent conjugate vaccine (PREVNAR 20) injection 0.5 mL, 0.5 mL, Intramuscular, Tomorrow-1000, Kasa, Kurian, MD   polyethylene glycol (MIRALAX / GLYCOLAX) packet 17 g, 17 g, Per Tube, Daily PRN, Rust-Chester, Micheline Rough L, NP   polyethylene glycol (MIRALAX / GLYCOLAX) packet 17 g, 17 g, Per Tube, Daily, Kasa, Wallis Bamberg, MD, 17 g at 08/21/23 0815   potassium PHOSPHATE 30 mmol in dextrose 5 % 500 mL infusion, 30 mmol, Intravenous, Once, [name], RPH, Last Rate: 85 mL/hr at 08/21/23 0942, 30 mmol at 08/21/23 0942   propofol (DIPRIVAN) 1000 MG/100ML infusion, 0-50 mcg/kg/min, Intravenous, Continuous, Kasa, Wallis Bamberg, MD, Stopped at 08/21/23 0827   QUEtiapine (SEROQUEL) tablet 50 mg, 50 mg, Per Tube, BID, Harlon Ditty D, NP, 50 mg at 08/21/23 0814   Racepinephrine HCl 2.25 % nebulizer solution 0.5 mL, 0.5 mL, Nebulization, Q4H PRN, Harlon Ditty D, NP   sodium chloride flush (NS) 0.9 % injection 10-40 mL, 10-40 mL, Intracatheter, Q12H, Erin Fulling, MD, 10 mL at 08/21/23 0816   sodium chloride flush (NS) 0.9 % injection 10-40 mL, 10-40 mL, Intracatheter, PRN, Erin Fulling, MD   thiamine (VITAMIN B1) 500 mg in sodium chloride 0.9 % 50 mL IVPB, 500 mg, Intravenous, Q24H, Stopped at 08/20/23 2143 **FOLLOWED BY** [START ON 08/23/2023] thiamine (VITAMIN B1) tablet 100 mg, 100 mg, Per Tube, Daily, Tressie Ellis, RPH    ALLERGIES   Patient has no known allergies.    REVIEW OF SYSTEMS      On ventilator unable to obtain ROS  PHYSICAL EXAMINATION   Vital Signs: Temp:  [98 F (36.7 C)-100.1 F (37.8 C)] 99.7 F (37.6 C) (01/27 0800) Pulse Rate:  [61-104] 104 (01/27 1015) Resp:  [15-24] 22 (01/27 1015) BP: (76-145)/(42-76) 123/65 (01/27 1015) SpO2:  [88 %-100 %] 93 % (01/27 1015) FiO2 (%):  [35 %] 35 % (01/27 0826) Weight:  [88.6 kg] 88.6 kg (01/27 0500)  GENERAL:NAD age appropriate HEAD: Normocephalic, atraumatic.  EYES: Pupils equal, round, reactive to light.  No scleral icterus.  MOUTH: Moist mucosal membrane. NECK: Supple. No thyromegaly. No nodules. No JVD.  ETT+ heavy phlegm PULMONARY: MV sounds CARDIOVASCULAR: S1 and S2. Regular rate and rhythm. No murmurs, rubs, or gallops.  GASTROINTESTINAL: Soft, nontender, non-distended. No masses. Positive bowel sounds. No hepatosplenomegaly. Stage 1 ulcer on sacrum MUSCULOSKELETAL: No swelling, clubbing, or edema.  NEUROLOGIC:GCS 4T SKIN:intact,warm,dry   PERTINENT DATA     Infusions:  dexmedetomidine (PRECEDEX) IV infusion Stopped (08/19/23 1423)   dextrose 5 % and 0.45 % NaCl 100 mL/hr at 08/21/23 0943   feeding supplement (OSMOLITE 1.5 CAL) Stopped (08/18/23 1650)   fentaNYL infusion INTRAVENOUS Stopped (08/21/23 0827)   norepinephrine (LEVOPHED) Adult infusion 3 mcg/min (08/21/23 0900)   piperacillin-tazobactam (ZOSYN)  IV 12.5 mL/hr at 08/21/23 0900   potassium PHOSPHATE IVPB (in mmol) 30 mmol (08/21/23 0942)   propofol (DIPRIVAN) infusion Stopped (08/21/23 0827)   thiamine (VITAMIN B1) injection Stopped (08/20/23 2143)   Scheduled Medications:  Chlorhexidine Gluconate Cloth  6 each Topical Daily   docusate  100 mg Per Tube BID   enoxaparin (LOVENOX) injection  40 mg Subcutaneous Q24H   famotidine  20 mg Per Tube BID  feeding supplement (PROSource TF20)  60 mL Per Tube Daily   fentaNYL (SUBLIMAZE) injection  50 mcg Intravenous Once   folic acid  1 mg Per Tube Daily   free water  30 mL Per Tube  Q4H   influenza vac split trivalent PF  0.5 mL Intramuscular Tomorrow-1000   insulin aspart  0-9 Units Subcutaneous Q4H   midodrine  10 mg Per Tube TID WC   multivitamin with minerals  1 tablet Per Tube Daily   mouth rinse  15 mL Mouth Rinse Q2H   pantoprazole (PROTONIX) IV  40 mg Intravenous Q24H   PHENObarbital  65 mg Intravenous Q8H   Followed by   [START ON 08/23/2023] PHENObarbital  32.5 mg Intravenous Q8H   pneumococcal 20-valent conjugate vaccine  0.5 mL Intramuscular Tomorrow-1000   polyethylene glycol  17 g Per Tube Daily   QUEtiapine  50 mg Per Tube BID   sodium chloride flush  10-40 mL Intracatheter Q12H   [START ON 08/23/2023] thiamine  100 mg Per Tube Daily   PRN Medications: acetaminophen, docusate, fentaNYL, ipratropium-albuterol, LORazepam, polyethylene glycol, Racepinephrine HCl, sodium chloride flush Hemodynamic parameters:   Intake/Output: 01/26 0701 - 01/27 0700 In: 4178.1 [I.V.:3670.9; NG/GT:300; IV Piggyback:207.1] Out: 2525 [Urine:2525]  Ventilator  Settings: Vent Mode: PRVC FiO2 (%):  [35 %] 35 % Set Rate:  [18 bmp] 18 bmp Vt Set:  [470 mL] 470 mL PEEP:  [5 cmH20] 5 cmH20 Plateau Pressure:  [14 cmH20-17 cmH20] 17 cmH20   LAB RESULTS:  Basic Metabolic Panel: Recent Labs  Lab 08/18/23 0426 08/18/23 1401 08/18/23 1654 08/19/23 0324 08/19/23 1623 08/20/23 0323 08/21/23 0450 08/21/23 0614  NA 143  --   --  137  --  139 133* 141  K 3.6  --   --  3.7  --  4.1 2.8* 3.2*  CL 111  --   --  103  --  107 105 112*  CO2 22  --   --  22  --  22 22 23   GLUCOSE 144*  --   --  180*  --  177* 537* 127*  BUN 15  --   --  16  --  33* 15 16  CREATININE 0.73  --   --  0.56  --  1.39* 0.72 0.76  CALCIUM 9.0  --   --  9.5  --  8.3* 6.9* 7.8*  MG 2.1 2.0 1.9 2.0 1.8  --  1.7  --   PHOS 3.0 2.7 3.2 3.6  3.4 3.3 3.5 1.6*  --    Liver Function Tests: Recent Labs  Lab 08/17/23 0415 08/17/23 0511 08/19/23 0324 08/20/23 0323 08/21/23 0450  AST 19  --   --   --    --   ALT QUANTITY NOT SUFFICIENT, UNABLE TO PERFORM TEST 11  --   --   --   ALKPHOS 81  --   --   --   --   BILITOT 0.5  --   --   --   --   PROT 7.2  --   --   --   --   ALBUMIN 3.7  --  3.7 2.9* 2.2*   Recent Labs  Lab 08/17/23 0415  LIPASE 30   No results for input(s): "AMMONIA" in the last 168 hours. CBC: Recent Labs  Lab 08/17/23 0415 08/18/23 0426 08/19/23 0324 08/20/23 0323 08/21/23 0450  WBC 9.8 10.9* 16.2* 22.4* 9.4  HGB 13.6 13.7 14.6 12.3 9.6*  HCT 41.5 41.6 42.7 37.6 29.5*  MCV 90.8 92.2 87.9 92.4 93.1  PLT 242 219 235 233 174   Cardiac Enzymes: No results for input(s): "CKTOTAL", "CKMB", "CKMBINDEX", "TROPONINI" in the last 168 hours. BNP: Invalid input(s): "POCBNP" CBG: Recent Labs  Lab 08/20/23 2329 08/21/23 0323 08/21/23 0601 08/21/23 0604 08/21/23 0727  GLUCAP 77 84 89 59* 102*       IMAGING RESULTS:     ASSESSMENT AND PLAN    -Multidisciplinary rounds held today  #Acute Hypoxic Respiratory Failure secondary to suspected drug overdose of undetermined intent & suspected Aspiration #Post extubation Stridor ~ TREATED  PMHx: COPD, Asthma, OSA EXTUBATED 1/24 & Reintubated 1/25 -Full vent support, implement lung protective strategies -Plateau pressures less than 30 cm H20 -Wean FiO2 & PEEP as tolerated to maintain O2 sats >92% -Follow intermittent Chest X-ray & ABG as needed -Spontaneous Breathing Trials when respiratory parameters met and mental status permits -Implement VAP Bundle -Prn Bronchodilators -ABX -zosyn >> augmentin -noted thick secretions per ETT recultured   #Hypotension: Septic shock vs Sedation related Echocardiogram: 08/17/23:  LVEF 60-65%, normal diastolic parameters, RV systolic function normal -Continuous cardiac monitoring -Maintain MAP >65 -IV fluids -Vasopressors as needed to maintain MAP goal - Midodrine 10 mg TID>>reduced to 5 tid  -Lactic acid is normalized -HS Troponin negative x2 -hold outpatient  losartan  -IVF - LR 1L bolus   #Multifocal Pneumonia, questionable aspiration -Monitor fever curve -Trend WBC's & Procalcitonin -Follow cultures as above    #Acute Kidney Injury #Hypokalemia ~ RESOLVED #Mild NAGMA ~ RESOLVED -Monitor I&O's / urinary output -Follow BMP -Ensure adequate renal perfusion -Avoid nephrotoxic agents as able -Replace electrolytes as indicated ~ Pharmacy following for assistance with electrolyte replacement -IV fluids -Consider Renal Ultrasound   #Type 2 Diabetes Mellitus Hemoglobin A1C: 6.3 (05/2023) -CBG's q4h; Target range of 140 to 180 -SSI -Follow ICU Hypo/Hyperglycemia protocol   #Acute Encephalopathy suspect secondary to drug overdose of undetermined intent in the setting of known history of Cocaine Abuse #ETOH use with Delirium Tremens #Sedation needs in setting of mechanical ventilation #Anxiety & Depression #Bipolar Disorder UDS + cocaine, marijuana & amphetamines. Blood Alcohol level elevated at 190 -Maintain a RASS goal of 0 to -1 -Fentanyl and Propofol as needed to maintain RASS goal -Avoid sedating medications as able -Daily wake up assessment -CIWA protocol -Continue Phenobarb taper -High dose thiamine x3 days followed by 100 mg daily -Folic acid and MVI -Hold outpatient regimen: Seroquel, effexor xr, vraylar. Consider restarting as patient stabilizes -once stabilized will need to screen for suicidal ideation         Best Practice (right click and "Reselect all SmartList Selections" daily)  Diet/type: NPO w/ meds via tube DVT prophylaxis LMWH Pressure ulcer(s): N/A GI prophylaxis: H2B Lines: N/A Foley:  Yes, and it is still needed Code Status:  full code Last date of multidisciplinary goals of care discussion [1/26]    ID -continue IV abx as prescibed -follow up cultures  GI/Nutrition GI PROPHYLAXIS as indicated DIET-->TF's as tolerated Constipation protocol as indicated  ENDO - ICU hypoglycemic\Hyperglycemia  protocol -check FSBS per protocol   ELECTROLYTES -follow labs as needed -replace as needed -pharmacy consultation   DVT/GI PRX ordered -SCDs  TRANSFUSIONS AS NEEDED MONITOR FSBS ASSESS the need for LABS as needed    Critical care provider statement:   Total critical care time: 36 minutes   Performed by: Karna Christmas MD   Critical care time was exclusive of separately billable procedures and treating other patients.  Critical care was necessary to treat or prevent imminent or life-threatening deterioration.   Critical care was time spent personally by me on the following activities: development of treatment plan with patient and/or surrogate as well as nursing, discussions with consultants, evaluation of patient's response to treatment, examination of patient, obtaining history from patient or surrogate, ordering and performing treatments and interventions, ordering and review of laboratory studies, ordering and review of radiographic studies, pulse oximetry and re-evaluation of patient's condition.    Vida Rigger, M.D.  Pulmonary & Critical Care Medicine

## 2023-08-21 NOTE — Consult Note (Signed)
PHARMACY CONSULT NOTE - ELECTROLYTES  Pharmacy Consult for Electrolyte Monitoring and Replacement   Recent Labs: Potassium (mmol/L)  Date Value  08/21/2023 2.8 (L)  10/15/2012 3.0 (L)   Magnesium (mg/dL)  Date Value  40/98/1191 1.7  10/04/2011 1.4 (L)   Calcium (mg/dL)  Date Value  47/82/9562 6.9 (L)   Calcium, Total (mg/dL)  Date Value  13/02/6577 8.3 (L)   Albumin (g/dL)  Date Value  46/96/2952 2.2 (L)  06/13/2023 4.3  10/15/2012 3.9   Phosphorus (mg/dL)  Date Value  84/13/2440 1.6 (L)   Sodium (mmol/L)  Date Value  08/21/2023 133 (L)  06/13/2023 142  10/15/2012 141   Height: 5\' 6"  (167.6 cm) Weight: 88.6 kg (195 lb 5.2 oz) IBW/kg (Calculated) : 59.3 Estimated Creatinine Clearance: 90.1 mL/min (by C-G formula based on SCr of 0.72 mg/dL).  Assessment  Andrea Sanford is a 53 y.o. female presenting with drug overdose / intoxication. PMH significant for polysubstance abuse, anxiety / depression, asthma / COPD, bipolar disorder, DM, neuromuscular disorder. Pharmacy has been consulted to monitor and replace electrolytes.  Diet: NPO MIVF: dextrose 5 % and 0.45 % NaCl infusion at 100 mL/hr Pertinent medications: N/A  Goal of Therapy: Electrolytes within normal limits  Plan:  30 mmol IV potassium phosphate x 1 (contains 44 mEq IV potassium) Electrolytes with labs tomorrow AM  Thank you for allowing pharmacy to be a part of this patient's care.  Andrea Sanford 08/21/2023 7:06 AM

## 2023-08-21 NOTE — Progress Notes (Signed)
Nutrition Follow-up  DOCUMENTATION CODES:   Obesity unspecified  INTERVENTION:   If patient does not extubate:  Vital 1.2@55ml /hr- Initiate at 56ml/hr and increase by 58ml/hr q 8 hours until goal rate is reached.   ProSource TF 20- Give 60ml daily via tube, each supplement provides 80kcal and 20g of protein.   Free water flushes 30ml q4 hours to maintain tube patency   Regimen provides 1664kcal/day, 119g/day protein and 1229ml/day of free water.   Pt at high refeed risk; recommend monitor potassium, magnesium and phosphorus labs daily until stable  Juven Fruit Punch BID via tube, each serving provides 95kcal and 2.5g of protein (amino acids glutamine and arginine)  Daily weights   NUTRITION DIAGNOSIS:   Inadequate oral intake related to inability to eat as evidenced by NPO status. -ongoing   GOAL:   Patient will meet greater than or equal to 90% of their needs -not met   MONITOR:   Vent status, Labs, Weight trends, TF tolerance, I & O's, Skin  ASSESSMENT:   53 y/o female with h/o DM, bipolar disorder, substance abuse, anxiety, depression, COPD, OSA and homelessness who is admitted with AKI, overdose and aspiration PNA.  Pt s/p bronchoscopy today  Pt extubated 1/24 and re-intubated 1/25 secondary to DTs. Pt remains sedated and ventilated. NGT remains in place. Tube feeds were not restarted after extubation. Will plan to restart tube feeds today if patient does not extubate. Pt is at refeed risk. Per chart, pt appears weight stable since admission. No BM since 1/24.   Medications reviewed and include: augmentin, colace, lovenox, pepcid, folic acid, insulin, MVI, protonix, miralax, thiamine, NaCl w/ 5% dextrose @100ml /hr, levophed   Labs reviewed: K 3.2(L), P 15.1(H), Mg 1.7 wnl Hgb 9.6(L), Hct 29.5(L) Cbgs- 132, 102, 59, 89, 84 x 24 hrs  AIC 6.3(H)- 06/13/23  Patient is currently intubated on ventilator support MV: 8.6 L/min Temp (24hrs), Avg:99.8 F (37.7 C),  Min:98 F (36.7 C), Max:101.8 F (38.8 C)  MAP- >49mmHg   UOP-  Diet Order:   Diet Order             Diet NPO time specified  Diet effective now                  EDUCATION NEEDS:   Not appropriate for education at this time  Skin:  Skin Assessment: Reviewed RN Assessment (Stage II buttocks, laceration L knee) Skin Integrity Issues:: Stage I Stage I: coccyx  Last BM:  PTA  Height:   Ht Readings from Last 1 Encounters:  08/17/23 5\' 6"  (1.676 m)    Weight:   Wt Readings from Last 1 Encounters:  08/21/23 88.6 kg    Ideal Body Weight:  61.4 kg  BMI:  Body mass index is 31.53 kg/m.  Estimated Nutritional Needs:   Kcal:  1300-1500kcal/day  Protein:  >120g/day  Fluid:  1.8-2.1L/day  Betsey Holiday MS, RD, LDN If unable to be reached, please send secure chat to "RD inpatient" available from 8:00a-4:00p daily

## 2023-08-21 NOTE — Plan of Care (Signed)
  Problem: Education: Goal: Ability to describe self-care measures that may prevent or decrease complications (Diabetes Survival Skills Education) will improve 08/21/2023 0640 by Bethena Midget, Mickel Baas, RN Outcome: Progressing 08/21/2023 0640 by Bethena Midget, Mickel Baas, RN Outcome: Progressing Goal: Individualized Educational Video(s) 08/21/2023 0640 by Bethena Midget, Mickel Baas, RN Outcome: Progressing 08/21/2023 0640 by Bethena Midget, Mickel Baas, RN Outcome: Progressing   Problem: Coping: Goal: Ability to adjust to condition or change in health will improve 08/21/2023 0640 by Bethena Midget, Mickel Baas, RN Outcome: Progressing 08/21/2023 0640 by Bethena Midget, Mickel Baas, RN Outcome: Progressing   Problem: Fluid Volume: Goal: Ability to maintain a balanced intake and output will improve 08/21/2023 0640 by Bethena Midget, Mickel Baas, RN Outcome: Progressing 08/21/2023 0640 by Bethena Midget, Mickel Baas, RN Outcome: Progressing   Problem: Health Behavior/Discharge Planning: Goal: Ability to identify and utilize available resources and services will improve 08/21/2023 0640 by Bethena Midget, Mickel Baas, RN Outcome: Progressing 08/21/2023 0640 by Bethena Midget, Mickel Baas, RN Outcome: Progressing Goal: Ability to manage health-related needs will improve 08/21/2023 0640 by Bethena Midget, Mickel Baas, RN Outcome: Progressing 08/21/2023 0640 by Bethena Midget, Mickel Baas, RN Outcome: Progressing   Problem: Nutritional: Goal: Maintenance of adequate nutrition will improve Outcome: Progressing Goal: Progress toward achieving an optimal weight will improve Outcome: Progressing   Problem: Skin Integrity: Goal: Risk for impaired skin integrity will decrease Outcome: Progressing   Problem: Clinical Measurements: Goal: Ability to maintain clinical measurements within normal limits will improve Outcome: Progressing Goal: Will remain free from infection Outcome: Progressing Goal: Diagnostic test results will improve Outcome:  Progressing Goal: Respiratory complications will improve Outcome: Progressing Goal: Cardiovascular complication will be avoided Outcome: Progressing

## 2023-08-21 NOTE — Progress Notes (Signed)
8:05 PM - A man called stating his name was Lyda Jester, saying he is a "friend" of the patient. Lyda Jester was not listed on the patient's contact list, and there was no code word/password set up at the time. Therefore, I informed him that I could not provide him with any information at this time.   8:55 PM - Patient's mother, Marylouise Stacks, called requesting an update. At this time a password was set up (password - Truddie Hidden) - see documentation.  Wilma requested that no medical information be shared with Lyda Jester. She also asked that we don't allow this man to visit her daughter. I explained that this would be documented in a note, but a password would help to prevent confusion in the future. Wilma agreed to inform the patient's brother of the new password.

## 2023-08-21 NOTE — Procedures (Signed)
PROCEDURE: BRONCHOSCOPY Therapeutic Aspiration of Tracheobronchial Tree  Fiberoptic bronchoscopy with bronchoalveolar lavage  PROCEDURE DATE: 08/21/2023  TIME:  NAME:  Andrea Sanford  DOB:January 30, 1971  MRN: [id number] LOC:  IC08A/IC08A-AA    HOSP DAY: @LENGTHOFSTAYDAYS @ CODE STATUS:      Code Status Orders  (From admission, onward)           Start     Ordered   08/17/23 0539  Full code  Continuous       Question:  By:  Answer:  Default: patient does not have capacity for decision making, no surrogate or prior directive available   08/17/23 0539           Code Status History     This patient has a current code status but no historical code status.           Indications/Preliminary Diagnosis:   Consent: (Place X beside choice/s below)  The benefits, risks and possible complications of the procedure were        explained to:  ___ patient  _x__ patient's family  ___ other:___________  who verbalized understanding and gave:  ___ verbal  __x_ written  ___ verbal and written  ___ telephone  ___ other:________ consent.      Unable to obtain consent; procedure performed on emergent basis.     Other:       PRESEDATION ASSESSMENT: History and Physical has been performed. Patient meds and allergies have been reviewed. Presedation airway examination has been performed and documented. Baseline vital signs, sedation score, oxygenation status, and cardiac rhythm were reviewed. Patient was deemed to be in satisfactory condition to undergo the procedure.       PROCEDURE DETAILS: Timeout performed and correct patient, name, & ID confirmed. Following prep per Pulmonary policy, appropriate sedation was administered. The Bronchoscope was inserted in to oral cavity with bite block in place. Therapeutic aspiration of Tracheobronchial tree was performed.  Airway exam proceeded with findings, technical procedures, and specimen collection as noted below. At the end of exam the  scope was withdrawn without incident. Impression and Plan as noted below.           Airway Prep (Place X beside choice below)   1% Transtracheal Lidocaine Anesthetization 7 cc   Patient prepped per Bronchoscopy Lab Policy       Insertion Route (Place X beside choice below)   Nasal   Oral  x Endotracheal Tube   Tracheostomy   INTRAPROCEDURE MEDICATIONS:  Sedative/Narcotic Amt Dose   Versed  mg   Fentanyl gtt mcg  Diprivan  mg       Medication Amt Dose  Medication Amt Dose  Lidocaine 1%  cc  Epinephrine 1:10,000 sol  cc  Xylocaine 4%  cc  Cocaine  cc   TECHNICAL PROCEDURES: (Place X beside choice below)   Procedures  Description    None     Electrocautery     Cryotherapy     Balloon Dilatation     Bronchography     Stent Placement   x  Therapeutic Aspiration RLL, LLL    Laser/Argon Plasma    Brachytherapy Catheter Placement    Foreign Body Removal         SPECIMENS (Sites): (Place X beside choice below)  Specimens Description   No Specimens Obtained     Washings   x Lavage RML   Biopsies    Fine Needle Aspirates    Brushings    Sputum    FINDINGS:  ESTIMATED BLOOD LOSS: none COMPLICATIONS/RESOLUTION: none      IMPRESSION:POST-PROCEDURE DX:   BAL sent for microbiology   RECOMMENDATION/PLAN:       Vida Rigger, M.D.  Pulmonary & Critical Care Medicine  [facility]

## 2023-08-21 NOTE — Plan of Care (Signed)
  Problem: Nutritional: Goal: Maintenance of adequate nutrition will improve Outcome: Progressing   Problem: Skin Integrity: Goal: Risk for impaired skin integrity will decrease Outcome: Progressing   Problem: Tissue Perfusion: Goal: Adequacy of tissue perfusion will improve Outcome: Progressing   Problem: Pain Managment: Goal: General experience of comfort will improve and/or be controlled Outcome: Progressing

## 2023-08-21 NOTE — Progress Notes (Signed)
Propofol and Fentanyl turned off for wake up assessment.

## 2023-08-22 LAB — CULTURE, BLOOD (ROUTINE X 2)
Culture: NO GROWTH
Culture: NO GROWTH
Special Requests: ADEQUATE

## 2023-08-22 LAB — RENAL FUNCTION PANEL
Albumin: 2.4 g/dL — ABNORMAL LOW (ref 3.5–5.0)
Anion gap: 5 (ref 5–15)
BUN: 9 mg/dL (ref 6–20)
CO2: 25 mmol/L (ref 22–32)
Calcium: 8 mg/dL — ABNORMAL LOW (ref 8.9–10.3)
Chloride: 112 mmol/L — ABNORMAL HIGH (ref 98–111)
Creatinine, Ser: 0.69 mg/dL (ref 0.44–1.00)
GFR, Estimated: >60 mL/min (ref >60)
Glucose, Bld: 156 mg/dL — ABNORMAL HIGH (ref 70–99)
Phosphorus: 2.9 mg/dL (ref 2.5–4.6)
Potassium: 3.6 mmol/L (ref 3.5–5.1)
Sodium: 142 mmol/L (ref 135–145)

## 2023-08-22 LAB — CBC
HCT: 30.9 % — ABNORMAL LOW (ref 36.0–46.0)
Hemoglobin: 10 g/dL — ABNORMAL LOW (ref 12.0–15.0)
MCH: 29.7 pg (ref 26.0–34.0)
MCHC: 32.4 g/dL (ref 30.0–36.0)
MCV: 91.7 fL (ref 80.0–100.0)
Platelets: 175 10*3/uL (ref 150–400)
RBC: 3.37 MIL/uL — ABNORMAL LOW (ref 3.87–5.11)
RDW: 13 % (ref 11.5–15.5)
WBC: 6.9 10*3/uL (ref 4.0–10.5)
nRBC: 0 % (ref 0.0–0.2)

## 2023-08-22 LAB — GLUCOSE, CAPILLARY
Glucose-Capillary: 107 mg/dL — ABNORMAL HIGH (ref 70–99)
Glucose-Capillary: 109 mg/dL — ABNORMAL HIGH (ref 70–99)
Glucose-Capillary: 109 mg/dL — ABNORMAL HIGH (ref 70–99)
Glucose-Capillary: 126 mg/dL — ABNORMAL HIGH (ref 70–99)
Glucose-Capillary: 136 mg/dL — ABNORMAL HIGH (ref 70–99)
Glucose-Capillary: 69 mg/dL — ABNORMAL LOW (ref 70–99)
Glucose-Capillary: 96 mg/dL (ref 70–99)

## 2023-08-22 LAB — MAGNESIUM: Magnesium: 2.2 mg/dL (ref 1.7–2.4)

## 2023-08-22 MED ORDER — MIDAZOLAM HCL 2 MG/2ML IJ SOLN
4.0000 mg | Freq: Once | INTRAMUSCULAR | Status: AC
  Administered 2023-08-22: 4 mg via INTRAVENOUS

## 2023-08-22 MED ORDER — DEXTROSE 50 % IV SOLN
12.5000 g | INTRAVENOUS | Status: AC
  Administered 2023-08-22: 12.5 g via INTRAVENOUS

## 2023-08-22 MED ORDER — MIDAZOLAM HCL 2 MG/2ML IJ SOLN
INTRAMUSCULAR | Status: AC
  Filled 2023-08-22: qty 4

## 2023-08-22 MED ORDER — GLYCOPYRROLATE 0.2 MG/ML IJ SOLN
0.2000 mg | Freq: Once | INTRAMUSCULAR | Status: AC
  Administered 2023-08-22: 0.2 mg via INTRAVENOUS

## 2023-08-22 MED ORDER — DEXTROSE 50 % IV SOLN
INTRAVENOUS | Status: AC
  Filled 2023-08-22: qty 50

## 2023-08-22 MED ORDER — GLYCOPYRROLATE 0.2 MG/ML IJ SOLN
INTRAMUSCULAR | Status: AC
  Filled 2023-08-22: qty 1

## 2023-08-22 NOTE — Progress Notes (Signed)
Per Zada Girt NP, okay to leave tube feedings off, may restart if patient is placed back on sedation

## 2023-08-22 NOTE — Progress Notes (Signed)
CRITICAL CARE PROGRESS NOTE    Name: Andrea Sanford MRN: [id number] DOB: [birth year]     LOS: 5   SUBJECTIVE FINDINGS & SIGNIFICANT EVENTS    History of Presenting Illness:  -patient with polysubstance abuse including drugs and alcohol.  She was found unresponsive.  She has PMH of psychiatric conditions, lifelong smoking, homelesness, COPD, anxiety and depression.    ED course: Upon arrival patient obtunded, tachypneic, hypothermic and hypertensive being supported with a BVM. Patient emergently intubated for airway protection in the setting of suspected drug overdose, becoming hypotensive after induction requiring vasopressor support. Labs significant for hypokalemia and mild NAGMA.   Imaging revealed vascular congestion on CXR with no acute abnormality on CTH.  Medications given: etomidate & succinylcholine, 3 L bolus, levophed drip started Initial Vitals: 95.1, 22, 95, 168/138, 90% BVM Significant labs: (Labs/ Imaging personally reviewed) I, [name], AGACNP-BC, personally viewed and interpreted this ECG. EKG Interpretation: Date: 08/17/23, EKG Time: 04:45, Rate: 77, Rhythm: NSR, QRS Axis:  normal, Intervals: borderline prolonged Qtc, ST/T Wave abnormalities: very mild STE in leads I, II and avL (does not meet STEMI criteria), Narrative Interpretation: NSR Chemistry: Na+:138, K+: 3.1, BUN/Cr.: 11/0.84, Serum CO2/ AG: 20/ 11 Hematology: WBC: 9.8, Hgb: 13.6,  Troponin: 6, BNP: pending, Lactic/ PCT: 0.9/pending,  COVID-19 & Influenza A/B: pending   Alcohol level: 190, Acetaminophen: <10, Salicylate: <7 UDS: +amphetamines, marijuana, cocaine   ABG: 7.27/49/173/22.5 CXR 08/17/23:  Low volume film with vascular congestion. Large gastric bubble despite the presence of an NG tube. CT head wo contrast  08/17/23: No acute intracranial abnormality identified. Mild but progressed cerebral white matter changes since 2019, most commonly due to small vessel disease. Chronic left facial fractures.   08/21/23- patient with heavy dark inspissated ETT secretions with bronchospasm. RT worried about increased resistance on MV due to thick resp mucus. We plan to perform bronchoscopy I reviewed medical findings with [place] mother of patient .  08/22/23- patient failed SBT today  Lines/tubes : Airway 8 mm (Active)  Secured at (cm) 22 cm 08/21/23 0826  Measured From Lips 08/21/23 0826  Secured Location Left 08/21/23 0826  Secured By [employer] 08/21/23 0826  Bite Block No 08/21/23 0826  Tube Holder Repositioned Yes 08/21/23 0826  Prone position No 08/21/23 0826  Cuff Pressure (cm H2O) Green OR 18-26 [facility] 08/21/23 0826  Site Condition Dry 08/21/23 0826     PICC Triple Lumen 08/20/23 Right Basilic 39 cm 0 cm (Active)  Indication for Insertion or Continuance of Line Vasoactive infusions 08/21/23 0733  Exposed Catheter (cm) 0 cm 08/21/23 0733  Site Assessment Clean, Dry, Intact 08/21/23 0733  Lumen #1 Status In-line blood sampling system in place 08/21/23 0733  Lumen #2 Status Flushed;Infusing;Blood return noted 08/21/23 0733  Lumen #3 Status Infusing;Flushed;Blood return noted 08/21/23 8295  Dressing Type Transparent;Securing device 08/21/23 6213  Dressing Status Antimicrobial disc/dressing in place 08/21/23 0865  Line Care Connections checked and tightened 08/21/23 7846  Line Adjustment (NICU/IV Team Only) No 08/20/23 1029  Dressing Intervention New dressing;Adhesive placed at insertion site (IV team only);Adhesive placed around edges of dressing (IV team/ICU RN only) 08/20/23 1029  Dressing Change Due 08/27/23 08/21/23 0733     NG/OG Vented/Dual Lumen 14 Fr. Right nare Marking at nare/corner of mouth 70 cm (Active)  Tube Position (Required) Marking at nare/corner of mouth 08/21/23  0736  Measurement (cm) (Required) 70 cm 08/21/23 0736  Ongoing Placement Verification (Required) (See row information) Yes 08/21/23 0736  Site Assessment Clean, Dry, Intact 08/21/23 0736  Interventions Clamped 08/21/23 0736  Status Clamped 08/21/23 0736  Intake (mL) 30 mL 08/21/23 0736  Output (mL) 0 mL 08/21/23 0600     Urethral Catheter C. Vazquez Non-latex 14 Fr. (Active)  Indication for Insertion or Continuance of Catheter Acute urinary retention (I&O Cath for 24 hrs prior to catheter insertion- Inpatient Only) 08/21/23 0736  Site Assessment Clean, Dry, Intact 08/21/23 0736  Catheter Maintenance Bag below level of bladder;Catheter secured;Drainage bag/tubing not touching floor;Insertion date on drainage bag;No dependent loops;Seal intact;Bag emptied prior to transport 08/21/23 0736  Collection Container Standard drainage bag 08/21/23 0736  Securement Method Adhesive securement device 08/21/23 0736  Output (mL) 70 mL 08/21/23 0600    Microbiology/Sepsis markers: Results for orders placed or performed during the hospital encounter of 08/17/23  Culture, blood (routine x 2)     Status: None   Collection Time: 08/17/23  4:52 AM   Specimen: BLOOD  Result Value Ref Range Status   Specimen Description BLOOD LEFT ANTECUBITAL  Final   Special Requests   Final    BOTTLES DRAWN AEROBIC AND ANAEROBIC Blood Culture results may not be optimal due to an excessive volume of blood received in culture bottles   Culture   Final    NO GROWTH 5 DAYS Performed at [facility], [address] [place], [place] [address]    Report Status 08/22/2023 FINAL  Final  Culture, blood (routine x 2)     Status: None   Collection Time: 08/17/23  4:52 AM   Specimen: BLOOD  Result Value Ref Range Status   Specimen Description BLOOD BLOOD LEFT HAND  Final   Special Requests   Final    BOTTLES DRAWN AEROBIC AND ANAEROBIC Blood Culture adequate volume   Culture   Final    NO GROWTH 5 DAYS Performed at  [facility], [address] [place], [place] [address]    Report Status 08/22/2023 FINAL  Final  Resp panel by RT-PCR (RSV, Flu A&B, Covid) Anterior Nasal Swab     Status: None   Collection Time: 08/17/23  5:11 AM   Specimen: Anterior Nasal Swab  Result Value Ref Range Status   SARS Coronavirus 2 by RT PCR NEGATIVE NEGATIVE Final    Comment: (NOTE) SARS-CoV-2 target nucleic acids are NOT DETECTED.  The SARS-CoV-2 RNA is generally detectable in upper respiratory specimens during the acute phase of infection. The lowest concentration of SARS-CoV-2 viral copies this assay can detect is 138 copies/mL. A negative result does not preclude SARS-Cov-2 infection and should not be used as the sole basis for treatment or other patient management decisions. A negative result may occur with  improper specimen collection/handling, submission of specimen other than nasopharyngeal swab, presence of viral mutation(s) within the areas targeted by this assay, and inadequate number of viral copies(<138 copies/mL). A negative result must be combined with clinical observations, patient history, and epidemiological information. The expected result is Negative.  Fact Sheet for Patients:  BloggerCourse.com  Fact Sheet for Healthcare Providers:  SeriousBroker.it  This test is no t yet approved or cleared by the [place] FDA and  has been authorized for detection and/or diagnosis of SARS-CoV-2 by FDA under an Emergency Use Authorization (EUA). This EUA will remain  in effect (meaning this test can be used) for the duration of the COVID-19 declaration under Section 564(b)(1) of the Act, 21 U.S.C.section 360bbb-3(b)(1), unless the authorization is terminated  or revoked sooner.  Influenza A by PCR NEGATIVE NEGATIVE Final   Influenza B by PCR NEGATIVE NEGATIVE Final    Comment: (NOTE) The Xpert Xpress SARS-CoV-2/FLU/RSV plus assay is  intended as an aid in the diagnosis of influenza from Nasopharyngeal swab specimens and should not be used as a sole basis for treatment. Nasal washings and aspirates are unacceptable for Xpert Xpress SARS-CoV-2/FLU/RSV testing.  Fact Sheet for Patients: BloggerCourse.com  Fact Sheet for Healthcare Providers: SeriousBroker.it  This test is not yet approved or cleared by the [place] FDA and has been authorized for detection and/or diagnosis of SARS-CoV-2 by FDA under an Emergency Use Authorization (EUA). This EUA will remain in effect (meaning this test can be used) for the duration of the COVID-19 declaration under Section 564(b)(1) of the Act, 21 U.S.C. section 360bbb-3(b)(1), unless the authorization is terminated or revoked.     Resp Syncytial Virus by PCR NEGATIVE NEGATIVE Final    Comment: (NOTE) Fact Sheet for Patients: BloggerCourse.com  Fact Sheet for Healthcare Providers: SeriousBroker.it  This test is not yet approved or cleared by the [place] FDA and has been authorized for detection and/or diagnosis of SARS-CoV-2 by FDA under an Emergency Use Authorization (EUA). This EUA will remain in effect (meaning this test can be used) for the duration of the COVID-19 declaration under Section 564(b)(1) of the Act, 21 U.S.C. section 360bbb-3(b)(1), unless the authorization is terminated or revoked.  Performed at [facility], [address] [place], [place] [address]   MRSA Next Gen by PCR, Nasal     Status: None   Collection Time: 08/17/23  6:00 AM   Specimen: Nasal Mucosa; Nasal Swab  Result Value Ref Range Status   MRSA by PCR Next Gen NOT DETECTED NOT DETECTED Final    Comment: (NOTE) The GeneXpert MRSA Assay (FDA approved for NASAL specimens only), is one component of a comprehensive MRSA colonization surveillance program. It is not intended to  diagnose MRSA infection nor to guide or monitor treatment for MRSA infections. Test performance is not FDA approved in patients less than 63 years old. Performed at [facility], [address] [place], [place] [address]   Culture, blood (Routine X 2) w Reflex to ID Panel     Status: None (Preliminary result)   Collection Time: 08/19/23  4:23 PM   Specimen: BLOOD  Result Value Ref Range Status   Specimen Description BLOOD BLOOD RIGHT ARM AEROBIC BOTTLE ONLY  Final   Special Requests   Final    BOTTLES DRAWN AEROBIC ONLY Blood Culture results may not be optimal due to an inadequate volume of blood received in culture bottles   Culture   Final    NO GROWTH 3 DAYS Performed at [facility], [address] [place], [place] [address]    Report Status PENDING  Incomplete  Culture, blood (Routine X 2) w Reflex to ID Panel     Status: None (Preliminary result)   Collection Time: 08/19/23  4:23 PM   Specimen: BLOOD  Result Value Ref Range Status   Specimen Description BLOOD BLOOD RIGHT HAND AEROBIC BOTTLE ONLY  Final   Special Requests   Final    BOTTLES DRAWN AEROBIC ONLY Blood Culture results may not be optimal due to an inadequate volume of blood received in culture bottles   Culture   Final    NO GROWTH 3 DAYS Performed at [facility], [address] [place], [place] [address]    Report Status PENDING  Incomplete  Respiratory (~20 pathogens) panel by PCR     Status: None   Collection Time: 08/20/23 11:04 AM   Specimen: Nasopharyngeal Swab; Respiratory  Result Value Ref Range Status   Adenovirus NOT DETECTED NOT DETECTED Final   Coronavirus 229E NOT DETECTED NOT DETECTED Final    Comment: (NOTE) The Coronavirus on the Respiratory Panel, DOES NOT test for the novel  Coronavirus (2019 nCoV)    Coronavirus HKU1 NOT DETECTED NOT DETECTED Final   Coronavirus NL63 NOT DETECTED NOT DETECTED Final   Coronavirus OC43 NOT DETECTED NOT DETECTED Final    Metapneumovirus NOT DETECTED NOT DETECTED Final   Rhinovirus / Enterovirus NOT DETECTED NOT DETECTED Final   Influenza A NOT DETECTED NOT DETECTED Final   Influenza B NOT DETECTED NOT DETECTED Final   Parainfluenza Virus 1 NOT DETECTED NOT DETECTED Final   Parainfluenza Virus 2 NOT DETECTED NOT DETECTED Final   Parainfluenza Virus 3 NOT DETECTED NOT DETECTED Final   Parainfluenza Virus 4 NOT DETECTED NOT DETECTED Final   Respiratory Syncytial Virus NOT DETECTED NOT DETECTED Final   Bordetella pertussis NOT DETECTED NOT DETECTED Final   Bordetella Parapertussis NOT DETECTED NOT DETECTED Final   Chlamydophila pneumoniae NOT DETECTED NOT DETECTED Final   Mycoplasma pneumoniae NOT DETECTED NOT DETECTED Final    Comment: Performed at [facility], [address] [place], [place] [address]  Culture, Respiratory w Gram Stain     Status: None (Preliminary result)   Collection Time: 08/20/23  1:56 PM   Specimen: Tracheal Aspirate; Respiratory  Result Value Ref Range Status   Specimen Description   Final    TRACHEAL ASPIRATE Performed at [facility], [address], [place], [place] [address]    Special Requests   Final    NONE Performed at [facility], [address] [place], [place] [address]    Gram Stain   Final    FEW WBC PRESENT, PREDOMINANTLY PMN RARE GRAM POSITIVE COCCI    Culture   Final    FEW STAPHYLOCOCCUS AUREUS SUSCEPTIBILITIES TO FOLLOW Performed at [facility], [address], [place], [place] [address]    Report Status PENDING  Incomplete  Culture, BAL-quantitative w Gram Stain     Status: None (Preliminary result)   Collection Time: 08/21/23  1:05 PM   Specimen: Bronchoalveolar Lavage; Respiratory  Result Value Ref Range Status   Specimen Description   Final    BRONCHIAL ALVEOLAR LAVAGE Performed at [facility], [address] [place], [place] [address]    Special Requests   Final    NONE Performed at [facility], [address] [place], [place] [address]    Gram Stain   Final    ABUNDANT WBC PRESENT, PREDOMINANTLY PMN NO ORGANISMS SEEN    Culture   Final    CULTURE REINCUBATED FOR BETTER GROWTH Performed at [facility], [address] [place], [place] [address]    Report Status PENDING  Incomplete    Anti-infectives:  Anti-infectives (From admission, onward)    Start     Dose/Rate Route Frequency Ordered Stop   08/21/23 1800  amoxicillin-clavulanate (AUGMENTIN) 400-57 MG/5ML suspension 875 mg        875 mg Per Tube Every 12 hours 08/21/23 1148     08/19/23 1730  piperacillin-tazobactam (ZOSYN) IVPB 3.375 g  Status:  Discontinued        3.375 g 12.5 mL/hr over 240 Minutes Intravenous Every 8  hours 08/19/23 1633 08/21/23 1146         PAST MEDICAL HISTORY   Past Medical History:  Diagnosis Date   Allergy    Anxiety    Arthritis    Asthma    Bipolar 1 disorder (HCC)    Cancer (HCC)    COPD (chronic obstructive pulmonary disease) (HCC)    Depression    Diabetes mellitus without complication (HCC)    Hypertension    Neuromuscular disorder (HCC)    Osteoporosis    Sleep apnea      SURGICAL HISTORY   Past Surgical History:  Procedure Laterality Date   ANKLE SURGERY Right    PERIPHERAL VASCULAR THROMBECTOMY Left 1991     FAMILY HISTORY   Family History  Problem Relation Age of Onset   Depression Mother    Diabetes Mother    Cancer Father      SOCIAL HISTORY   Social History   Tobacco Use   Smoking status: Every Day    Current packs/day: 1.00    Types: Cigarettes   Smokeless tobacco: Never  Substance Use Topics   Alcohol use: Not Currently    Alcohol/week: 2.0 standard drinks of alcohol    Types: 2 Standard drinks or equivalent per week   Drug use: Yes    Types: Cocaine    Comment: uses on occasions     MEDICATIONS   Current Medication:  Current Facility-Administered Medications:    acetaminophen (TYLENOL) tablet 650 mg,  650 mg, Per Tube, Q4H PRN, Rust-Chester, Britton L, NP, 650 mg at 08/21/23 1337   amoxicillin-clavulanate (AUGMENTIN) 400-57 MG/5ML suspension 875 mg, 875 mg, Per Tube, Q12H, Vida Rigger, MD, 875 mg at 08/22/23 0915   Chlorhexidine Gluconate Cloth 2 % PADS 6 each, 6 each, Topical, Daily, Erin Fulling, MD, 6 each at 08/22/23 0940   dexmedetomidine (PRECEDEX) 400 MCG/100ML (4 mcg/mL) infusion, 0-1.2 mcg/kg/hr, Intravenous, Continuous, Rust-Chester, Cecelia Byars, NP, Stopped at 08/19/23 1423   docusate (COLACE) 50 MG/5ML liquid 100 mg, 100 mg, Per Tube, BID PRN, Rust-Chester, Micheline Rough L, NP   docusate (COLACE) 50 MG/5ML liquid 100 mg, 100 mg, Per Tube, BID, Belia Heman, Kurian, MD, 100 mg at 08/22/23 0914   enoxaparin (LOVENOX) injection 40 mg, 40 mg, Subcutaneous, Q24H, Tressie Ellis, RPH, 40 mg at 08/22/23 1042   famotidine (PEPCID) tablet 20 mg, 20 mg, Per Tube, BID, Kasa, Kurian, MD, 20 mg at 08/22/23 0914   feeding supplement (PROSource TF20) liquid 60 mL, 60 mL, Per Tube, Daily, Kasa, Kurian, MD, 60 mL at 08/22/23 0915   feeding supplement (VITAL AF 1.2 CAL) liquid 1,000 mL, 1,000 mL, Per Tube, Continuous, Noe Goyer, MD, Last Rate: 45 mL/hr at 08/22/23 1642, Infusion Verify at 08/22/23 1642   fentaNYL (SUBLIMAZE) bolus via infusion 50-100 mcg, 50-100 mcg, Intravenous, Q15 min PRN, Belia Heman, Wallis Bamberg, MD, 100 mcg at 08/19/23 1757   fentaNYL (SUBLIMAZE) injection 50 mcg, 50 mcg, Intravenous, Once, Kasa, Kurian, MD   fentaNYL in NS (38mcg/ml) infusion-PREMIX, 50-200 mcg/hr, Intravenous, Continuous, Kasa, Kurian, MD, Last Rate: 15 mL/hr at 08/22/23 1642, 150 mcg/hr at 08/22/23 1642   folic acid (FOLVITE) tablet 1 mg, 1 mg, Per Tube, Daily, Rust-Chester, Britton L, NP, 1 mg at 08/22/23 0914   free water 30 mL, 30 mL, Per Tube, Q4H, Kasa, Kurian, MD, 30 mL at 08/22/23 1638   influenza vac split trivalent PF (FLULAVAL) injection 0.5 mL, 0.5 mL, Intramuscular, Tomorrow-1000, Kasa, Kurian, MD    insulin aspart (novoLOG)  injection 0-9 Units, 0-9 Units, Subcutaneous, Q4H, Rust-Chester, Britton L, NP, 1 Units at 08/21/23 1700   ipratropium-albuterol (DUONEB) 0.5-2.5 (3) MG/3ML nebulizer solution 3 mL, 3 mL, Nebulization, Q4H PRN, Rust-Chester, Britton L, NP, 3 mL at 08/18/23 1118   LORazepam (ATIVAN) injection 1 mg, 1 mg, Intravenous, Q4H PRN, Erin Fulling, MD, 1 mg at 08/19/23 1350   midodrine (PROAMATINE) tablet 5 mg, 5 mg, Per Tube, TID WC, Vida Rigger, MD, 5 mg at 08/22/23 1635   multivitamin with minerals tablet 1 tablet, 1 tablet, Per Tube, Daily, Rust-Chester, Cecelia Byars, NP, 1 tablet at 08/22/23 0913   norepinephrine (LEVOPHED) 4mg  in (0.016 mg/mL) premix infusion, 2-10 mcg/min, Intravenous, Titrated, Erin Fulling, MD, Stopped at 08/22/23 0919   nutrition supplement (JUVEN) (JUVEN) powder packet 1 packet, 1 packet, Per Tube, BID BM, Vida Rigger, MD, 1 packet at 08/22/23 1339   Oral care mouth rinse, 15 mL, Mouth Rinse, Q2H, Kasa, Kurian, MD, 15 mL at 08/22/23 1606   pantoprazole (PROTONIX) injection 40 mg, 40 mg, Intravenous, Q24H, Kasa, Kurian, MD, 40 mg at 08/22/23 1443   [COMPLETED] PHENObarbital (LUMINAL) injection 97.5 mg, 97.5 mg, Intravenous, Q8H, 97.5 mg at 08/21/23 0455 **FOLLOWED BY** PHENObarbital (LUMINAL) injection 65 mg, 65 mg, Intravenous, Q8H, 65 mg at 08/22/23 1220 **FOLLOWED BY** [START ON 08/23/2023] PHENObarbital (LUMINAL) injection 32.5 mg, 32.5 mg, Intravenous, Q8H, Greenwood, Howard F, RPH   pneumococcal 20-valent conjugate vaccine (PREVNAR 20) injection 0.5 mL, 0.5 mL, Intramuscular, Tomorrow-1000, Kasa, Kurian, MD   polyethylene glycol (MIRALAX / GLYCOLAX) packet 17 g, 17 g, Per Tube, Daily PRN, Rust-Chester, Micheline Rough L, NP   polyethylene glycol (MIRALAX / GLYCOLAX) packet 17 g, 17 g, Per Tube, Daily, Belia Heman, Kurian, MD, 17 g at 08/22/23 0915   propofol (DIPRIVAN) 1000 MG/100ML infusion, 0-50 mcg/kg/min, Intravenous, Continuous, Kasa, Kurian, MD, Last  Rate: 20.5 mL/hr at 08/22/23 1642, 40 mcg/kg/min at 08/22/23 1642   QUEtiapine (SEROQUEL) tablet 50 mg, 50 mg, Per Tube, BID, Harlon Ditty D, NP, 50 mg at 08/22/23 0914   Racepinephrine HCl 2.25 % nebulizer solution 0.5 mL, 0.5 mL, Nebulization, Q4H PRN, Harlon Ditty D, NP   sodium chloride flush (NS) 0.9 % injection 10-40 mL, 10-40 mL, Intracatheter, Q12H, Belia Heman, Wallis Bamberg, MD, 20 mL at 08/22/23 1610   sodium chloride flush (NS) 0.9 % injection 10-40 mL, 10-40 mL, Intracatheter, PRN, Erin Fulling, MD   [COMPLETED] thiamine (VITAMIN B1) 500 mg in sodium chloride 0.9 % 50 mL IVPB, 500 mg, Intravenous, Q24H, Stopped at 08/21/23 2203 **FOLLOWED BY** [START ON 08/23/2023] thiamine (VITAMIN B1) tablet 100 mg, 100 mg, Per Tube, Daily, Tressie Ellis, RPH    ALLERGIES   Patient has no known allergies.    REVIEW OF SYSTEMS     On ventilator unable to obtain ROS  PHYSICAL EXAMINATION   Vital Signs: Temp:  [99.2 F (37.3 C)-100.1 F (37.8 C)] 99.7 F (37.6 C) (01/28 1605) Pulse Rate:  [58-100] 76 (01/28 1630) Resp:  [0-28] 18 (01/28 1630) BP: (81-211)/(49-189) 122/61 (01/28 1630) SpO2:  [95 %-100 %] 96 % (01/28 1630) FiO2 (%):  [28 %-35 %] 35 % (01/28 1624) Weight:  [88.6 kg] 88.6 kg (01/28 0500)  GENERAL:NAD age appropriate HEAD: Normocephalic, atraumatic.  EYES: Pupils equal, round, reactive to light.  No scleral icterus.  MOUTH: Moist mucosal membrane. NECK: Supple. No thyromegaly. No nodules. No JVD.  ETT+ heavy phlegm PULMONARY: MV sounds CARDIOVASCULAR: S1 and S2. Regular rate and rhythm. No murmurs, rubs, or gallops.  GASTROINTESTINAL:  Soft, nontender, non-distended. No masses. Positive bowel sounds. No hepatosplenomegaly. Stage 1 ulcer on sacrum MUSCULOSKELETAL: No swelling, clubbing, or edema.  NEUROLOGIC:GCS 4T SKIN:intact,warm,dry   PERTINENT DATA     Infusions:  dexmedetomidine (PRECEDEX) IV infusion Stopped (08/19/23 1423)   feeding supplement (VITAL AF 1.2  CAL) 45 mL/hr at 08/22/23 1642   fentaNYL infusion INTRAVENOUS 150 mcg/hr (08/22/23 1642)   norepinephrine (LEVOPHED) Adult infusion Stopped (08/22/23 0919)   propofol (DIPRIVAN) infusion 40 mcg/kg/min (08/22/23 1642)   Scheduled Medications:  amoxicillin-clavulanate  875 mg Per Tube Q12H   Chlorhexidine Gluconate Cloth  6 each Topical Daily   docusate  100 mg Per Tube BID   enoxaparin (LOVENOX) injection  40 mg Subcutaneous Q24H   famotidine  20 mg Per Tube BID   feeding supplement (PROSource TF20)  60 mL Per Tube Daily   fentaNYL (SUBLIMAZE) injection  50 mcg Intravenous Once   folic acid  1 mg Per Tube Daily   free water  30 mL Per Tube Q4H   influenza vac split trivalent PF  0.5 mL Intramuscular Tomorrow-1000   insulin aspart  0-9 Units Subcutaneous Q4H   midodrine  5 mg Per Tube TID WC   multivitamin with minerals  1 tablet Per Tube Daily   nutrition supplement (JUVEN)  1 packet Per Tube BID BM   mouth rinse  15 mL Mouth Rinse Q2H   pantoprazole (PROTONIX) IV  40 mg Intravenous Q24H   PHENObarbital  65 mg Intravenous Q8H   Followed by   [START ON 08/23/2023] PHENObarbital  32.5 mg Intravenous Q8H   pneumococcal 20-valent conjugate vaccine  0.5 mL Intramuscular Tomorrow-1000   polyethylene glycol  17 g Per Tube Daily   QUEtiapine  50 mg Per Tube BID   sodium chloride flush  10-40 mL Intracatheter Q12H   [START ON 08/23/2023] thiamine  100 mg Per Tube Daily   PRN Medications: acetaminophen, docusate, fentaNYL, ipratropium-albuterol, LORazepam, polyethylene glycol, Racepinephrine HCl, sodium chloride flush Hemodynamic parameters:   Intake/Output: 01/27 0701 - 01/28 0700 In: 2332.9 [I.V.:1500.3; NG/GT:171.7; IV Piggyback:660.9] Out: 2650 [Urine:2650]  Ventilator  Settings: Vent Mode: PRVC FiO2 (%):  [28 %-35 %] 35 % Set Rate:  [18 bmp] 18 bmp Vt Set:  [470 mL] 470 mL PEEP:  [5 cmH20] 5 cmH20 Pressure Support:  [5 cmH20] 5 cmH20 Plateau Pressure:  [17 cmH20] 17  cmH20   LAB RESULTS:  Basic Metabolic Panel: Recent Labs  Lab 08/19/23 0324 08/19/23 1623 08/20/23 0323 08/21/23 0450 08/21/23 0614 08/21/23 1347 08/21/23 2120 08/22/23 0414  NA 137  --  139 133* 141  --   --  142  K 3.7  --  4.1 2.8* 3.2*  --  3.3* 3.6  CL 103  --  107 105 112*  --   --  112*  CO2 22  --  22 22 23   --   --  25  GLUCOSE 180*  --  177* 537* 127*  --   --  156*  BUN 16  --  33* 15 16  --   --  9  CREATININE 0.56  --  1.39* 0.72 0.76  --   --  0.69  CALCIUM 9.5  --  8.3* 6.9* 7.8*  --   --  8.0*  MG 2.0 1.8  --  1.7  --  1.7  --  2.2  PHOS 3.6  3.4 3.3 3.5 1.6*  --  15.1* 3.4 2.9   Liver Function Tests: Recent Labs  Lab 08/17/23 0415 08/17/23 0511 08/19/23 0324 08/20/23 0323 08/21/23 0450 08/22/23 0414  AST 19  --   --   --   --   --   ALT QUANTITY NOT SUFFICIENT, UNABLE TO PERFORM TEST 11  --   --   --   --   ALKPHOS 81  --   --   --   --   --   BILITOT 0.5  --   --   --   --   --   PROT 7.2  --   --   --   --   --   ALBUMIN 3.7  --  3.7 2.9* 2.2* 2.4*   Recent Labs  Lab 08/17/23 0415  LIPASE 30   No results for input(s): "AMMONIA" in the last 168 hours. CBC: Recent Labs  Lab 08/18/23 0426 08/19/23 0324 08/20/23 0323 08/21/23 0450 08/22/23 0414  WBC 10.9* 16.2* 22.4* 9.4 6.9  HGB 13.7 14.6 12.3 9.6* 10.0*  HCT 41.6 42.7 37.6 29.5* 30.9*  MCV 92.2 87.9 92.4 93.1 91.7  PLT 219 235 233 174 175   Cardiac Enzymes: No results for input(s): "CKTOTAL", "CKMB", "CKMBINDEX", "TROPONINI" in the last 168 hours. BNP: Invalid input(s): "POCBNP" CBG: Recent Labs  Lab 08/22/23 0340 08/22/23 0417 08/22/23 0751 08/22/23 1130 08/22/23 1602  GLUCAP 69* 136* 96 109* 107*       IMAGING RESULTS:     ASSESSMENT AND PLAN    -Multidisciplinary rounds held today  #Acute Hypoxic Respiratory Failure secondary to suspected drug overdose of undetermined intent & suspected Aspiration #Post extubation Stridor ~ TREATED  PMHx: COPD, Asthma,  OSA EXTUBATED 1/24 & Reintubated 1/25 -Full vent support, implement lung protective strategies -Plateau pressures less than 30 cm H20 -Wean FiO2 & PEEP as tolerated to maintain O2 sats >92% -Follow intermittent Chest X-ray & ABG as needed -Spontaneous Breathing Trials when respiratory parameters met and mental status permits -Implement VAP Bundle -Prn Bronchodilators -ABX -zosyn >> augmentin -noted thick secretions per ETT recultured   #Hypotension: Septic shock vs Sedation related Echocardiogram: 08/17/23:  LVEF 60-65%, normal diastolic parameters, RV systolic function normal -Continuous cardiac monitoring -Maintain MAP >65 -IV fluids -Vasopressors as needed to maintain MAP goal - Midodrine 10 mg TID>>reduced to 5 tid  -Lactic acid is normalized -HS Troponin negative x2 -hold outpatient losartan  -IVF - LR 1L bolus   #Multifocal Pneumonia, questionable aspiration -Monitor fever curve -Trend WBC's & Procalcitonin -Follow cultures as above    #Acute Kidney Injury #Hypokalemia ~ RESOLVED #Mild NAGMA ~ RESOLVED -Monitor I&O's / urinary output -Follow BMP -Ensure adequate renal perfusion -Avoid nephrotoxic agents as able -Replace electrolytes as indicated ~ Pharmacy following for assistance with electrolyte replacement -IV fluids -Consider Renal Ultrasound   #Type 2 Diabetes Mellitus Hemoglobin A1C: 6.3 (05/2023) -CBG's q4h; Target range of 140 to 180 -SSI -Follow ICU Hypo/Hyperglycemia protocol   #Acute Encephalopathy suspect secondary to drug overdose of undetermined intent in the setting of known history of Cocaine Abuse #ETOH use with Delirium Tremens #Sedation needs in setting of mechanical ventilation #Anxiety & Depression #Bipolar Disorder UDS + cocaine, marijuana & amphetamines. Blood Alcohol level elevated at 190 -Maintain a RASS goal of 0 to -1 -Fentanyl and Propofol as needed to maintain RASS goal -Avoid sedating medications as able -Daily wake up  assessment -CIWA protocol -Continue Phenobarb taper -High dose thiamine x3 days followed by 100 mg daily -Folic acid and MVI -Hold outpatient regimen: Seroquel, effexor xr, vraylar. Consider restarting as patient  stabilizes -once stabilized will need to screen for suicidal ideation         Best Practice (right click and "Reselect all SmartList Selections" daily)  Diet/type: NPO w/ meds via tube DVT prophylaxis LMWH Pressure ulcer(s): N/A GI prophylaxis: H2B Lines: N/A Foley:  Yes, and it is still needed Code Status:  full code Last date of multidisciplinary goals of care discussion [1/26]    ID -continue IV abx as prescibed -follow up cultures  GI/Nutrition GI PROPHYLAXIS as indicated DIET-->TF's as tolerated Constipation protocol as indicated  ENDO - ICU hypoglycemic\Hyperglycemia protocol -check FSBS per protocol   ELECTROLYTES -follow labs as needed -replace as needed -pharmacy consultation   DVT/GI PRX ordered -SCDs  TRANSFUSIONS AS NEEDED MONITOR FSBS ASSESS the need for LABS as needed    Critical care provider statement:   Total critical care time: 33 minutes   Performed by: Karna Christmas MD   Critical care time was exclusive of separately billable procedures and treating other patients.   Critical care was necessary to treat or prevent imminent or life-threatening deterioration.   Critical care was time spent personally by me on the following activities: development of treatment plan with patient and/or surrogate as well as nursing, discussions with consultants, evaluation of patient's response to treatment, examination of patient, obtaining history from patient or surrogate, ordering and performing treatments and interventions, ordering and review of laboratory studies, ordering and review of radiographic studies, pulse oximetry and re-evaluation of patient's condition.    Vida Rigger, M.D.  Pulmonary & Critical Care Medicine

## 2023-08-22 NOTE — Plan of Care (Signed)
  Problem: Education: Goal: Ability to describe self-care measures that may prevent or decrease complications (Diabetes Survival Skills Education) will improve Outcome: Progressing Goal: Individualized Educational Video(s) Outcome: Progressing   Problem: Coping: Goal: Ability to adjust to condition or change in health will improve Outcome: Progressing   Problem: Fluid Volume: Goal: Ability to maintain a balanced intake and output will improve Outcome: Progressing   Problem: Health Behavior/Discharge Planning: Goal: Ability to identify and utilize available resources and services will improve Outcome: Progressing Goal: Ability to manage health-related needs will improve Outcome: Progressing   Problem: Metabolic: Goal: Ability to maintain appropriate glucose levels will improve Outcome: Progressing   Problem: Nutritional: Goal: Maintenance of adequate nutrition will improve Outcome: Progressing Goal: Progress toward achieving an optimal weight will improve Outcome: Progressing   Problem: Skin Integrity: Goal: Risk for impaired skin integrity will decrease Outcome: Progressing   Problem: Tissue Perfusion: Goal: Adequacy of tissue perfusion will improve Outcome: Progressing   Problem: Education: Goal: Knowledge of General Education information will improve Description: Including pain rating scale, medication(s)/side effects and non-pharmacologic comfort measures Outcome: Progressing   Problem: Health Behavior/Discharge Planning: Goal: Ability to manage health-related needs will improve Outcome: Progressing   Problem: Clinical Measurements: Goal: Ability to maintain clinical measurements within normal limits will improve Outcome: Progressing Goal: Will remain free from infection Outcome: Progressing Goal: Diagnostic test results will improve Outcome: Progressing Goal: Respiratory complications will improve Outcome: Progressing Goal: Cardiovascular complication will  be avoided Outcome: Progressing   Problem: Activity: Goal: Risk for activity intolerance will decrease Outcome: Progressing   Problem: Nutrition: Goal: Adequate nutrition will be maintained Outcome: Progressing   Problem: Coping: Goal: Level of anxiety will decrease Outcome: Progressing   Problem: Elimination: Goal: Will not experience complications related to bowel motility Outcome: Progressing Goal: Will not experience complications related to urinary retention Outcome: Progressing   Problem: Pain Managment: Goal: General experience of comfort will improve and/or be controlled Outcome: Progressing   Problem: Safety: Goal: Ability to remain free from injury will improve Outcome: Progressing   Problem: Skin Integrity: Goal: Risk for impaired skin integrity will decrease Outcome: Progressing   Problem: Education: Goal: Knowledge of disease or condition will improve Outcome: Progressing Goal: Knowledge of the prescribed therapeutic regimen will improve Outcome: Progressing Goal: Individualized Educational Video(s) Outcome: Progressing   Problem: Activity: Goal: Ability to tolerate increased activity will improve Outcome: Progressing Goal: Will verbalize the importance of balancing activity with adequate rest periods Outcome: Progressing   Problem: Respiratory: Goal: Ability to maintain a clear airway will improve Outcome: Progressing Goal: Levels of oxygenation will improve Outcome: Progressing Goal: Ability to maintain adequate ventilation will improve Outcome: Progressing   Problem: Activity: Goal: Ability to tolerate increased activity will improve Outcome: Progressing   Problem: Respiratory: Goal: Ability to maintain a clear airway and adequate ventilation will improve Outcome: Progressing   Problem: Role Relationship: Goal: Method of communication will improve Outcome: Progressing

## 2023-08-22 NOTE — TOC CM/SW Note (Signed)
Transition of Care [facility] - Inpatient Brief Assessment   Patient Details  Name: Andrea Sanford MRN: [id number] Date of Birth: Dec 03, 1970  Transition of Care [facility]) CM/SW Contact:    Margarito Liner, LCSW Phone Number: 08/22/2023, 1:28 PM   Clinical Narrative: CSW reviewed chart. No TOC needs identified at this time. CSW will continue to follow progress. Please place [facility] consult if any needs arise.  Transition of Care Asessment: Insurance and Status: Insurance coverage has been reviewed Patient has primary care physician: Yes Home environment has been reviewed: Home Prior level of function:: Not documented Prior/Current Home Services: No current home services Social Drivers of Health Review: SDOH reviewed no interventions necessary Readmission risk has been reviewed: Yes Transition of care needs: no transition of care needs at this time

## 2023-08-22 NOTE — Plan of Care (Signed)
  Problem: Nutritional: Goal: Maintenance of adequate nutrition will improve Outcome: Progressing   Problem: Clinical Measurements: Goal: Cardiovascular complication will be avoided Outcome: Progressing   Problem: Nutrition: Goal: Adequate nutrition will be maintained Outcome: Progressing   Problem: Safety: Goal: Ability to remain free from injury will improve Outcome: Progressing   Problem: Respiratory: Goal: Ability to maintain adequate ventilation will improve Outcome: Progressing

## 2023-08-22 NOTE — Consult Note (Signed)
PHARMACY CONSULT NOTE - ELECTROLYTES  Pharmacy Consult for Electrolyte Monitoring and Replacement   Recent Labs: Potassium (mmol/L)  Date Value  08/22/2023 3.6  10/15/2012 3.0 (L)   Magnesium (mg/dL)  Date Value  03/47/4259 2.2  10/04/2011 1.4 (L)   Calcium (mg/dL)  Date Value  56/38/7564 8.0 (L)   Calcium, Total (mg/dL)  Date Value  33/29/5188 8.3 (L)   Albumin (g/dL)  Date Value  41/66/0630 2.4 (L)  06/13/2023 4.3  10/15/2012 3.9   Phosphorus (mg/dL)  Date Value  16/07/930 2.9   Sodium (mmol/L)  Date Value  08/22/2023 142  06/13/2023 142  10/15/2012 141   Height: 5\' 6"  (167.6 cm) Weight: 88.6 kg (195 lb 5.2 oz) IBW/kg (Calculated) : 59.3 Estimated Creatinine Clearance: 90.1 mL/min (by C-G formula based on SCr of 0.69 mg/dL).  Assessment  Andrea Sanford is a 53 y.o. female presenting with drug overdose / intoxication. PMH significant for polysubstance abuse, anxiety / depression, asthma / COPD, bipolar disorder, DM, neuromuscular disorder. Pharmacy has been consulted to monitor and replace electrolytes.  Goal of Therapy: Electrolytes within normal limits  Plan:  No electrolyte replacement warranted for today Electrolytes with labs tomorrow AM  Thank you for allowing pharmacy to be a part of this patient's care.  [name] 08/22/2023 7:13 AM

## 2023-08-23 LAB — RENAL FUNCTION PANEL
Albumin: 2.6 g/dL — ABNORMAL LOW (ref 3.5–5.0)
Anion gap: 10 (ref 5–15)
BUN: 15 mg/dL (ref 6–20)
CO2: 28 mmol/L (ref 22–32)
Calcium: 8.5 mg/dL — ABNORMAL LOW (ref 8.9–10.3)
Chloride: 105 mmol/L (ref 98–111)
Creatinine, Ser: 0.62 mg/dL (ref 0.44–1.00)
GFR, Estimated: >60 mL/min (ref >60)
Glucose, Bld: 131 mg/dL — ABNORMAL HIGH (ref 70–99)
Phosphorus: 3.6 mg/dL (ref 2.5–4.6)
Potassium: 3.7 mmol/L (ref 3.5–5.1)
Sodium: 143 mmol/L (ref 135–145)

## 2023-08-23 LAB — CBC
HCT: 35.5 % — ABNORMAL LOW (ref 36.0–46.0)
Hemoglobin: 11.2 g/dL — ABNORMAL LOW (ref 12.0–15.0)
MCH: 29.9 pg (ref 26.0–34.0)
MCHC: 31.5 g/dL (ref 30.0–36.0)
MCV: 94.7 fL (ref 80.0–100.0)
Platelets: 185 10*3/uL (ref 150–400)
RBC: 3.75 MIL/uL — ABNORMAL LOW (ref 3.87–5.11)
RDW: 12.7 % (ref 11.5–15.5)
WBC: 6.6 10*3/uL (ref 4.0–10.5)
nRBC: 0 % (ref 0.0–0.2)

## 2023-08-23 LAB — GLUCOSE, CAPILLARY
Glucose-Capillary: 120 mg/dL — ABNORMAL HIGH (ref 70–99)
Glucose-Capillary: 133 mg/dL — ABNORMAL HIGH (ref 70–99)
Glucose-Capillary: 136 mg/dL — ABNORMAL HIGH (ref 70–99)
Glucose-Capillary: 150 mg/dL — ABNORMAL HIGH (ref 70–99)
Glucose-Capillary: 155 mg/dL — ABNORMAL HIGH (ref 70–99)
Glucose-Capillary: 156 mg/dL — ABNORMAL HIGH (ref 70–99)

## 2023-08-23 LAB — MAGNESIUM: Magnesium: 2 mg/dL (ref 1.7–2.4)

## 2023-08-23 LAB — POTASSIUM: Potassium: 3.9 mmol/L (ref 3.5–5.1)

## 2023-08-23 LAB — TRIGLYCERIDES: Triglycerides: 182 mg/dL — ABNORMAL HIGH (ref <150)

## 2023-08-23 MED ORDER — QUETIAPINE FUMARATE 25 MG PO TABS
100.0000 mg | ORAL_TABLET | Freq: Two times a day (BID) | ORAL | Status: DC
  Administered 2023-08-23 – 2023-08-28 (×11): 100 mg
  Filled 2023-08-23 (×11): qty 4

## 2023-08-23 MED ORDER — GABAPENTIN 250 MG/5ML PO SOLN
800.0000 mg | Freq: Three times a day (TID) | ORAL | Status: DC
  Administered 2023-08-23 – 2023-09-14 (×63): 800 mg
  Filled 2023-08-23 (×72): qty 16

## 2023-08-23 MED ORDER — ONDANSETRON HCL 4 MG/2ML IJ SOLN
4.0000 mg | Freq: Four times a day (QID) | INTRAMUSCULAR | Status: DC | PRN
  Administered 2023-08-23: 4 mg via INTRAVENOUS
  Filled 2023-08-23: qty 2

## 2023-08-23 MED ORDER — OSMOLITE 1.5 CAL PO LIQD
1000.0000 mL | ORAL | Status: DC
  Administered 2023-08-23 – 2023-09-03 (×9): 1000 mL

## 2023-08-23 MED ORDER — GLYCOPYRROLATE 0.2 MG/ML IJ SOLN
0.1000 mg | Freq: Three times a day (TID) | INTRAMUSCULAR | Status: DC
  Administered 2023-08-23 – 2023-08-24 (×5): 0.1 mg via INTRAVENOUS
  Filled 2023-08-23 (×6): qty 1

## 2023-08-23 MED ORDER — CLONAZEPAM 0.125 MG PO TBDP
1.0000 mg | ORAL_TABLET | Freq: Two times a day (BID) | ORAL | Status: DC
  Administered 2023-08-23 – 2023-08-28 (×11): 1 mg
  Filled 2023-08-23 (×11): qty 8

## 2023-08-23 MED ORDER — IPRATROPIUM-ALBUTEROL 0.5-2.5 (3) MG/3ML IN SOLN
3.0000 mL | Freq: Four times a day (QID) | RESPIRATORY_TRACT | Status: DC
  Administered 2023-08-23 – 2023-08-26 (×12): 3 mL via RESPIRATORY_TRACT
  Filled 2023-08-23 (×13): qty 3

## 2023-08-23 MED ORDER — SULFAMETHOXAZOLE-TRIMETHOPRIM 200-40 MG/5ML PO SUSP
20.0000 mL | Freq: Two times a day (BID) | ORAL | Status: DC
  Administered 2023-08-23 – 2023-08-28 (×10): 20 mL
  Filled 2023-08-23 (×11): qty 20

## 2023-08-23 NOTE — Progress Notes (Signed)
CRITICAL CARE PROGRESS NOTE    Name: Andrea Sanford MRN: [id number] DOB: [birth year]     LOS: 6   SUBJECTIVE FINDINGS & SIGNIFICANT EVENTS    History of Presenting Illness:  -patient with polysubstance abuse including drugs and alcohol.  She was found unresponsive.  She has PMH of psychiatric conditions, lifelong smoking, homelesness, COPD, anxiety and depression.    ED course: Upon arrival patient obtunded, tachypneic, hypothermic and hypertensive being supported with a BVM. Patient emergently intubated for airway protection in the setting of suspected drug overdose, becoming hypotensive after induction requiring vasopressor support. Labs significant for hypokalemia and mild NAGMA.   Imaging revealed vascular congestion on CXR with no acute abnormality on CTH.  Medications given: etomidate & succinylcholine, 3 L bolus, levophed drip started Initial Vitals: 95.1, 22, 95, 168/138, 90% BVM Significant labs: (Labs/ Imaging personally reviewed) I, [name], AGACNP-BC, personally viewed and interpreted this ECG. EKG Interpretation: Date: 08/17/23, EKG Time: 04:45, Rate: 77, Rhythm: NSR, QRS Axis:  normal, Intervals: borderline prolonged Qtc, ST/T Wave abnormalities: very mild STE in leads I, II and avL (does not meet STEMI criteria), Narrative Interpretation: NSR Chemistry: Na+:138, K+: 3.1, BUN/Cr.: 11/0.84, Serum CO2/ AG: 20/ 11 Hematology: WBC: 9.8, Hgb: 13.6,  Troponin: 6, BNP: pending, Lactic/ PCT: 0.9/pending,  COVID-19 & Influenza A/B: pending   Alcohol level: 190, Acetaminophen: <10, Salicylate: <7 UDS: +amphetamines, marijuana, cocaine   ABG: 7.27/49/173/22.5 CXR 08/17/23:  Low volume film with vascular congestion. Large gastric bubble despite the presence of an NG tube. CT head wo contrast  08/17/23: No acute intracranial abnormality identified. Mild but progressed cerebral white matter changes since 2019, most commonly due to small vessel disease. Chronic left facial fractures.   08/21/23- patient with heavy dark inspissated ETT secretions with bronchospasm. RT worried about increased resistance on MV due to thick resp mucus. We plan to perform bronchoscopy I reviewed medical findings with [place] mother of patient .  08/22/23- patient failed SBT today, failed with AMS and hypoxemia 08/23/23- mentation is improved, SBT passed.  For liberation today.  High risk for withdrawal from drug/alcohol abuse.  Post  extubation with aggitation Water engineer. Trach aspirate with staph aureus.   Lines/tubes : Airway 8 mm (Active)  Secured at (cm) 22 cm 08/21/23 0826  Measured From Lips 08/21/23 0826  Secured Location Left 08/21/23 0826  Secured By [employer] 08/21/23 0826  Bite Block No 08/21/23 0826  Tube Holder Repositioned Yes 08/21/23 0826  Prone position No 08/21/23 0826  Cuff Pressure (cm H2O) Green OR 18-26 [facility] 08/21/23 0826  Site Condition Dry 08/21/23 0826     PICC Triple Lumen 08/20/23 Right Basilic 39 cm 0 cm (Active)  Indication for Insertion or Continuance of Line Vasoactive infusions 08/21/23 0733  Exposed Catheter (cm) 0 cm 08/21/23 0733  Site Assessment Clean, Dry, Intact 08/21/23 0733  Lumen #1 Status In-line blood sampling system in place 08/21/23 0733  Lumen #2 Status Flushed;Infusing;Blood return noted 08/21/23 0733  Lumen #3 Status Infusing;Flushed;Blood return noted 08/21/23 4401  Dressing Type Transparent;Securing device 08/21/23 0272  Dressing Status Antimicrobial disc/dressing in place 08/21/23 5366  Line Care Connections checked and tightened 08/21/23 4403  Line Adjustment (NICU/IV Team Only) No 08/20/23 1029  Dressing Intervention New dressing;Adhesive placed at insertion site (IV team only);Adhesive placed around edges of dressing (IV team/ICU  RN only) 08/20/23 1029  Dressing Change Due 08/27/23 08/21/23 0733     NG/OG Vented/Dual Lumen 14 Fr. Right nare Marking  at nare/corner of mouth 70 cm (Active)  Tube Position (Required) Marking at nare/corner of mouth 08/21/23 0736  Measurement (cm) (Required) 70 cm 08/21/23 0736  Ongoing Placement Verification (Required) (See row information) Yes 08/21/23 0736  Site Assessment Clean, Dry, Intact 08/21/23 0736  Interventions Clamped 08/21/23 0736  Status Clamped 08/21/23 0736  Intake (mL) 30 mL 08/21/23 0736  Output (mL) 0 mL 08/21/23 0600     Urethral Catheter C. Vazquez Non-latex 14 Fr. (Active)  Indication for Insertion or Continuance of Catheter Acute urinary retention (I&O Cath for 24 hrs prior to catheter insertion- Inpatient Only) 08/21/23 0736  Site Assessment Clean, Dry, Intact 08/21/23 0736  Catheter Maintenance Bag below level of bladder;Catheter secured;Drainage bag/tubing not touching floor;Insertion date on drainage bag;No dependent loops;Seal intact;Bag emptied prior to transport 08/21/23 0736  Collection Container Standard drainage bag 08/21/23 0736  Securement Method Adhesive securement device 08/21/23 0736  Output (mL) 70 mL 08/21/23 0600    Microbiology/Sepsis markers: Results for orders placed or performed during the hospital encounter of 08/17/23  Culture, blood (routine x 2)     Status: None   Collection Time: 08/17/23  4:52 AM   Specimen: BLOOD  Result Value Ref Range Status   Specimen Description BLOOD LEFT ANTECUBITAL  Final   Special Requests   Final    BOTTLES DRAWN AEROBIC AND ANAEROBIC Blood Culture results may not be optimal due to an excessive volume of blood received in culture bottles   Culture   Final    NO GROWTH 5 DAYS Performed at [facility], [address], [place], [place] [address]    Report Status 08/22/2023 FINAL  Final  Culture, blood (routine x 2)     Status: None   Collection Time: 08/17/23  4:52 AM   Specimen: BLOOD   Result Value Ref Range Status   Specimen Description BLOOD BLOOD LEFT HAND  Final   Special Requests   Final    BOTTLES DRAWN AEROBIC AND ANAEROBIC Blood Culture adequate volume   Culture   Final    NO GROWTH 5 DAYS Performed at [facility], [address] [place], [place] [address]    Report Status 08/22/2023 FINAL  Final  Resp panel by RT-PCR (RSV, Flu A&B, Covid) Anterior Nasal Swab     Status: None   Collection Time: 08/17/23  5:11 AM   Specimen: Anterior Nasal Swab  Result Value Ref Range Status   SARS Coronavirus 2 by RT PCR NEGATIVE NEGATIVE Final    Comment: (NOTE) SARS-CoV-2 target nucleic acids are NOT DETECTED.  The SARS-CoV-2 RNA is generally detectable in upper respiratory specimens during the acute phase of infection. The lowest concentration of SARS-CoV-2 viral copies this assay can detect is 138 copies/mL. A negative result does not preclude SARS-Cov-2 infection and should not be used as the sole basis for treatment or other patient management decisions. A negative result may occur with  improper specimen collection/handling, submission of specimen other than nasopharyngeal swab, presence of viral mutation(s) within the areas targeted by this assay, and inadequate number of viral copies(<138 copies/mL). A negative result must be combined with clinical observations, patient history, and epidemiological information. The expected result is Negative.  Fact Sheet for Patients:  BloggerCourse.com  Fact Sheet for Healthcare Providers:  SeriousBroker.it  This test is no t yet approved or cleared by the [place] FDA and  has been authorized for detection and/or diagnosis of SARS-CoV-2 by FDA under an Emergency Use Authorization (EUA). This EUA  will remain  in effect (meaning this test can be used) for the duration of the COVID-19 declaration under Section 564(b)(1) of the Act, 21 U.S.C.section  360bbb-3(b)(1), unless the authorization is terminated  or revoked sooner.       Influenza A by PCR NEGATIVE NEGATIVE Final   Influenza B by PCR NEGATIVE NEGATIVE Final    Comment: (NOTE) The Xpert Xpress SARS-CoV-2/FLU/RSV plus assay is intended as an aid in the diagnosis of influenza from Nasopharyngeal swab specimens and should not be used as a sole basis for treatment. Nasal washings and aspirates are unacceptable for Xpert Xpress SARS-CoV-2/FLU/RSV testing.  Fact Sheet for Patients: BloggerCourse.com  Fact Sheet for Healthcare Providers: SeriousBroker.it  This test is not yet approved or cleared by the [place] FDA and has been authorized for detection and/or diagnosis of SARS-CoV-2 by FDA under an Emergency Use Authorization (EUA). This EUA will remain in effect (meaning this test can be used) for the duration of the COVID-19 declaration under Section 564(b)(1) of the Act, 21 U.S.C. section 360bbb-3(b)(1), unless the authorization is terminated or revoked.     Resp Syncytial Virus by PCR NEGATIVE NEGATIVE Final    Comment: (NOTE) Fact Sheet for Patients: BloggerCourse.com  Fact Sheet for Healthcare Providers: SeriousBroker.it  This test is not yet approved or cleared by the [place] FDA and has been authorized for detection and/or diagnosis of SARS-CoV-2 by FDA under an Emergency Use Authorization (EUA). This EUA will remain in effect (meaning this test can be used) for the duration of the COVID-19 declaration under Section 564(b)(1) of the Act, 21 U.S.C. section 360bbb-3(b)(1), unless the authorization is terminated or revoked.  Performed at [facility], [address] [place], [place] [address]   MRSA Next Gen by PCR, Nasal     Status: None   Collection Time: 08/17/23  6:00 AM   Specimen: Nasal Mucosa; Nasal Swab  Result Value Ref Range  Status   MRSA by PCR Next Gen NOT DETECTED NOT DETECTED Final    Comment: (NOTE) The GeneXpert MRSA Assay (FDA approved for NASAL specimens only), is one component of a comprehensive MRSA colonization surveillance program. It is not intended to diagnose MRSA infection nor to guide or monitor treatment for MRSA infections. Test performance is not FDA approved in patients less than 51 years old. Performed at [facility], [address] [place], [place] [address]   Culture, blood (Routine X 2) w Reflex to ID Panel     Status: None (Preliminary result)   Collection Time: 08/19/23  4:23 PM   Specimen: BLOOD  Result Value Ref Range Status   Specimen Description BLOOD BLOOD RIGHT ARM AEROBIC BOTTLE ONLY  Final   Special Requests   Final    BOTTLES DRAWN AEROBIC ONLY Blood Culture results may not be optimal due to an inadequate volume of blood received in culture bottles   Culture   Final    NO GROWTH 4 DAYS Performed at [facility], [address], [place], [place] [address]    Report Status PENDING  Incomplete  Culture, blood (Routine X 2) w Reflex to ID Panel     Status: None (Preliminary result)   Collection Time: 08/19/23  4:23 PM   Specimen: BLOOD  Result Value Ref Range Status   Specimen Description BLOOD BLOOD RIGHT HAND AEROBIC BOTTLE ONLY  Final   Special Requests   Final    BOTTLES DRAWN AEROBIC ONLY Blood Culture results may not be optimal due  to an inadequate volume of blood received in culture bottles   Culture   Final    NO GROWTH 4 DAYS Performed at [facility], [address] [place], [place] [address]    Report Status PENDING  Incomplete  Respiratory (~20 pathogens) panel by PCR     Status: None   Collection Time: 08/20/23 11:04 AM   Specimen: Nasopharyngeal Swab; Respiratory  Result Value Ref Range Status   Adenovirus NOT DETECTED NOT DETECTED Final   Coronavirus 229E NOT DETECTED NOT DETECTED Final    Comment: (NOTE) The  Coronavirus on the Respiratory Panel, DOES NOT test for the novel  Coronavirus (2019 nCoV)    Coronavirus HKU1 NOT DETECTED NOT DETECTED Final   Coronavirus NL63 NOT DETECTED NOT DETECTED Final   Coronavirus OC43 NOT DETECTED NOT DETECTED Final   Metapneumovirus NOT DETECTED NOT DETECTED Final   Rhinovirus / Enterovirus NOT DETECTED NOT DETECTED Final   Influenza A NOT DETECTED NOT DETECTED Final   Influenza B NOT DETECTED NOT DETECTED Final   Parainfluenza Virus 1 NOT DETECTED NOT DETECTED Final   Parainfluenza Virus 2 NOT DETECTED NOT DETECTED Final   Parainfluenza Virus 3 NOT DETECTED NOT DETECTED Final   Parainfluenza Virus 4 NOT DETECTED NOT DETECTED Final   Respiratory Syncytial Virus NOT DETECTED NOT DETECTED Final   Bordetella pertussis NOT DETECTED NOT DETECTED Final   Bordetella Parapertussis NOT DETECTED NOT DETECTED Final   Chlamydophila pneumoniae NOT DETECTED NOT DETECTED Final   Mycoplasma pneumoniae NOT DETECTED NOT DETECTED Final    Comment: Performed at [facility], [address], [place], [place] [address]  Culture, Respiratory w Gram Stain     Status: None (Preliminary result)   Collection Time: 08/20/23  1:56 PM   Specimen: Tracheal Aspirate; Respiratory  Result Value Ref Range Status   Specimen Description   Final    TRACHEAL ASPIRATE Performed at [facility], [address], [place], [place] [address]    Special Requests   Final    NONE Performed at [facility], [address] [place], [place] [address]    Gram Stain   Final    FEW WBC PRESENT, PREDOMINANTLY PMN RARE GRAM POSITIVE COCCI    Culture   Final    FEW STAPHYLOCOCCUS AUREUS SUSCEPTIBILITIES TO FOLLOW Performed at [facility], [address], [place], [place] [address]    Report Status PENDING  Incomplete  Culture, BAL-quantitative w Gram Stain     Status: None (Preliminary result)   Collection Time: 08/21/23  1:05 PM   Specimen: Bronchoalveolar Lavage;  Respiratory  Result Value Ref Range Status   Specimen Description   Final    BRONCHIAL ALVEOLAR LAVAGE Performed at [facility], [address] [place], [place] [address]    Special Requests   Final    NONE Performed at [facility], [address] [place], [place] [address]    Gram Stain   Final    ABUNDANT WBC PRESENT, PREDOMINANTLY PMN NO ORGANISMS SEEN    Culture   Final    CULTURE REINCUBATED FOR BETTER GROWTH Performed at [facility], [address], [place], [place] [address]    Report Status PENDING  Incomplete    Anti-infectives:  Anti-infectives (From admission, onward)    Start     Dose/Rate Route Frequency Ordered Stop   08/21/23 1800  amoxicillin-clavulanate (AUGMENTIN) 400-57 MG/5ML suspension 875 mg  875 mg Per Tube Every 12 hours 08/21/23 1148     08/19/23 1730  piperacillin-tazobactam (ZOSYN) IVPB 3.375 g  Status:  Discontinued        3.375 g 12.5 mL/hr over 240 Minutes Intravenous Every 8 hours 08/19/23 1633 08/21/23 1146         PAST MEDICAL HISTORY   Past Medical History:  Diagnosis Date   Allergy    Anxiety    Arthritis    Asthma    Bipolar 1 disorder (HCC)    Cancer (HCC)    COPD (chronic obstructive pulmonary disease) (HCC)    Depression    Diabetes mellitus without complication (HCC)    Hypertension    Neuromuscular disorder (HCC)    Osteoporosis    Sleep apnea      SURGICAL HISTORY   Past Surgical History:  Procedure Laterality Date   ANKLE SURGERY Right    PERIPHERAL VASCULAR THROMBECTOMY Left 1991     FAMILY HISTORY   Family History  Problem Relation Age of Onset   Depression Mother    Diabetes Mother    Cancer Father      SOCIAL HISTORY   Social History   Tobacco Use   Smoking status: Every Day    Current packs/day: 1.00    Types: Cigarettes   Smokeless tobacco: Never  Substance Use Topics   Alcohol use: Not Currently    Alcohol/week: 2.0 standard drinks of alcohol     Types: 2 Standard drinks or equivalent per week   Drug use: Yes    Types: Cocaine    Comment: uses on occasions     MEDICATIONS   Current Medication:  Current Facility-Administered Medications:    acetaminophen (TYLENOL) tablet 650 mg, 650 mg, Per Tube, Q4H PRN, Rust-Chester, Britton L, NP, 650 mg at 08/21/23 1337   amoxicillin-clavulanate (AUGMENTIN) 400-57 MG/5ML suspension 875 mg, 875 mg, Per Tube, Q12H, Vida Rigger, MD, 875 mg at 08/22/23 2126   Chlorhexidine Gluconate Cloth 2 % PADS 6 each, 6 each, Topical, Daily, Erin Fulling, MD, 6 each at 08/22/23 0940   dexmedetomidine (PRECEDEX) 400 MCG/100ML (4 mcg/mL) infusion, 0-1.2 mcg/kg/hr, Intravenous, Continuous, Rust-Chester, Cecelia Byars, NP, Stopped at 08/19/23 1423   docusate (COLACE) 50 MG/5ML liquid 100 mg, 100 mg, Per Tube, BID PRN, Rust-Chester, Micheline Rough L, NP   docusate (COLACE) 50 MG/5ML liquid 100 mg, 100 mg, Per Tube, BID, Kasa, Kurian, MD, 100 mg at 08/22/23 2123   enoxaparin (LOVENOX) injection 40 mg, 40 mg, Subcutaneous, Q24H, Tressie Ellis, RPH, 40 mg at 08/22/23 1042   famotidine (PEPCID) tablet 20 mg, 20 mg, Per Tube, BID, Kasa, Kurian, MD, 20 mg at 08/22/23 2123   feeding supplement (PROSource TF20) liquid 60 mL, 60 mL, Per Tube, Daily, Kasa, Kurian, MD, 60 mL at 08/22/23 0915   feeding supplement (VITAL AF 1.2 CAL) liquid 1,000 mL, 1,000 mL, Per Tube, Continuous, Fredrica Capano, MD, Last Rate: 55 mL/hr at 08/23/23 0610, Infusion Verify at 08/23/23 0610   fentaNYL (SUBLIMAZE) bolus via infusion 50-100 mcg, 50-100 mcg, Intravenous, Q15 min PRN, Belia Heman, Wallis Bamberg, MD, 100 mcg at 08/19/23 1757   fentaNYL (SUBLIMAZE) injection 50 mcg, 50 mcg, Intravenous, Once, Kasa, Kurian, MD   fentaNYL in NS (74mcg/ml) infusion-PREMIX, 50-200 mcg/hr, Intravenous, Continuous, Kasa, Kurian, MD, Last Rate: 10 mL/hr at 08/23/23 0610, 100 mcg/hr at 08/23/23 0610   folic acid (FOLVITE) tablet 1 mg, 1 mg, Per Tube, Daily,  Rust-Chester, Britton L, NP, 1 mg at 08/22/23 [phone]  free water 30 mL, 30 mL, Per Tube, Q4H, Kasa, Kurian, MD, 30 mL at 08/23/23 0400   influenza vac split trivalent PF (FLULAVAL) injection 0.5 mL, 0.5 mL, Intramuscular, Tomorrow-1000, Kasa, Kurian, MD   insulin aspart (novoLOG) injection 0-9 Units, 0-9 Units, Subcutaneous, Q4H, Rust-Chester, Britton L, NP, 1 Units at 08/22/23 2335   ipratropium-albuterol (DUONEB) 0.5-2.5 (3) MG/3ML nebulizer solution 3 mL, 3 mL, Nebulization, Q4H PRN, Rust-Chester, Britton L, NP, 3 mL at 08/18/23 1118   LORazepam (ATIVAN) injection 1 mg, 1 mg, Intravenous, Q4H PRN, Erin Fulling, MD, 1 mg at 08/19/23 1350   midodrine (PROAMATINE) tablet 5 mg, 5 mg, Per Tube, TID WC, Karna Christmas, Aailyah Dunbar, MD, 5 mg at 08/22/23 1635   multivitamin with minerals tablet 1 tablet, 1 tablet, Per Tube, Daily, Rust-Chester, Cecelia Byars, NP, 1 tablet at 08/22/23 0913   norepinephrine (LEVOPHED) 4mg  in (0.016 mg/mL) premix infusion, 2-10 mcg/min, Intravenous, Titrated, Erin Fulling, MD, Stopped at 08/22/23 0919   nutrition supplement (JUVEN) (JUVEN) powder packet 1 packet, 1 packet, Per Tube, BID BM, Vida Rigger, MD, 1 packet at 08/22/23 1339   Oral care mouth rinse, 15 mL, Mouth Rinse, Q2H, Kasa, Kurian, MD, 15 mL at 08/23/23 0559   pantoprazole (PROTONIX) injection 40 mg, 40 mg, Intravenous, Q24H, Kasa, Kurian, MD, 40 mg at 08/22/23 1443   [COMPLETED] PHENObarbital (LUMINAL) injection 97.5 mg, 97.5 mg, Intravenous, Q8H, 97.5 mg at 08/21/23 0455 **FOLLOWED BY** [COMPLETED] PHENObarbital (LUMINAL) injection 65 mg, 65 mg, Intravenous, Q8H, 65 mg at 08/23/23 0433 **FOLLOWED BY** PHENObarbital (LUMINAL) injection 32.5 mg, 32.5 mg, Intravenous, Q8H, Greenwood, Howard F, RPH   pneumococcal 20-valent conjugate vaccine (PREVNAR 20) injection 0.5 mL, 0.5 mL, Intramuscular, Tomorrow-1000, Kasa, Kurian, MD   polyethylene glycol (MIRALAX / GLYCOLAX) packet 17 g, 17 g, Per Tube, Daily PRN, Rust-Chester,  Micheline Rough L, NP   polyethylene glycol (MIRALAX / GLYCOLAX) packet 17 g, 17 g, Per Tube, Daily, Kasa, Kurian, MD, 17 g at 08/22/23 0915   propofol (DIPRIVAN) 1000 MG/100ML infusion, 0-50 mcg/kg/min, Intravenous, Continuous, Kasa, Wallis Bamberg, MD, Last Rate: 12.84 mL/hr at 08/23/23 0635, 25 mcg/kg/min at 08/23/23 0635   QUEtiapine (SEROQUEL) tablet 50 mg, 50 mg, Per Tube, BID, Harlon Ditty D, NP, 50 mg at 08/22/23 2123   Racepinephrine HCl 2.25 % nebulizer solution 0.5 mL, 0.5 mL, Nebulization, Q4H PRN, Harlon Ditty D, NP   sodium chloride flush (NS) 0.9 % injection 10-40 mL, 10-40 mL, Intracatheter, Q12H, Erin Fulling, MD, 10 mL at 08/22/23 2126   sodium chloride flush (NS) 0.9 % injection 10-40 mL, 10-40 mL, Intracatheter, PRN, Erin Fulling, MD   [COMPLETED] thiamine (VITAMIN B1) 500 mg in sodium chloride 0.9 % 50 mL IVPB, 500 mg, Intravenous, Q24H, Stopped at 08/21/23 2203 **FOLLOWED BY** thiamine (VITAMIN B1) tablet 100 mg, 100 mg, Per Tube, Daily, Tressie Ellis, RPH    ALLERGIES   Patient has no known allergies.    REVIEW OF SYSTEMS     On ventilator unable to obtain ROS  PHYSICAL EXAMINATION   Vital Signs: Temp:  [99.1 F (37.3 C)-100 F (37.8 C)] 99.2 F (37.3 C) (01/29 0800) Pulse Rate:  [63-100] 72 (01/29 0700) Resp:  [14-28] 18 (01/29 0700) BP: (93-211)/(58-189) 97/58 (01/29 0700) SpO2:  [95 %-100 %] 96 % (01/29 0700) FiO2 (%):  [28 %-35 %] 35 % (01/29 0400) Weight:  [91.7 kg] 91.7 kg (01/29 0500)  GENERAL:NAD age appropriate HEAD: Normocephalic, atraumatic.  EYES: Pupils equal, round, reactive to light.  No scleral icterus.  MOUTH: Moist mucosal membrane. NECK: Supple. No thyromegaly. No nodules. No JVD.  ETT+ heavy phlegm PULMONARY: MV sounds CARDIOVASCULAR: S1 and S2. Regular rate and rhythm. No murmurs, rubs, or gallops.  GASTROINTESTINAL: Soft, nontender, non-distended. No masses. Positive bowel sounds. No hepatosplenomegaly. Stage 1 ulcer on  sacrum MUSCULOSKELETAL: No swelling, clubbing, or edema.  NEUROLOGIC:GCS 4T SKIN:intact,warm,dry   PERTINENT DATA     Infusions:  dexmedetomidine (PRECEDEX) IV infusion Stopped (08/19/23 1423)   feeding supplement (VITAL AF 1.2 CAL) 55 mL/hr at 08/23/23 0610   fentaNYL infusion INTRAVENOUS 100 mcg/hr (08/23/23 0610)   norepinephrine (LEVOPHED) Adult infusion Stopped (08/22/23 0919)   propofol (DIPRIVAN) infusion 25 mcg/kg/min (08/23/23 1308)   Scheduled Medications:  amoxicillin-clavulanate  875 mg Per Tube Q12H   Chlorhexidine Gluconate Cloth  6 each Topical Daily   docusate  100 mg Per Tube BID   enoxaparin (LOVENOX) injection  40 mg Subcutaneous Q24H   famotidine  20 mg Per Tube BID   feeding supplement (PROSource TF20)  60 mL Per Tube Daily   fentaNYL (SUBLIMAZE) injection  50 mcg Intravenous Once   folic acid  1 mg Per Tube Daily   free water  30 mL Per Tube Q4H   influenza vac split trivalent PF  0.5 mL Intramuscular Tomorrow-1000   insulin aspart  0-9 Units Subcutaneous Q4H   midodrine  5 mg Per Tube TID WC   multivitamin with minerals  1 tablet Per Tube Daily   nutrition supplement (JUVEN)  1 packet Per Tube BID BM   mouth rinse  15 mL Mouth Rinse Q2H   pantoprazole (PROTONIX) IV  40 mg Intravenous Q24H   PHENObarbital  32.5 mg Intravenous Q8H   pneumococcal 20-valent conjugate vaccine  0.5 mL Intramuscular Tomorrow-1000   polyethylene glycol  17 g Per Tube Daily   QUEtiapine  50 mg Per Tube BID   sodium chloride flush  10-40 mL Intracatheter Q12H   thiamine  100 mg Per Tube Daily   PRN Medications: acetaminophen, docusate, fentaNYL, ipratropium-albuterol, LORazepam, polyethylene glycol, Racepinephrine HCl, sodium chloride flush Hemodynamic parameters:   Intake/Output: 01/28 0701 - 01/29 0700 In: 1663.4 [I.V.:725.5; NG/GT:937.9] Out: 2105 [Urine:2105]  Ventilator  Settings: Vent Mode: PRVC FiO2 (%):  [28 %-35 %] 35 % Set Rate:  [18 bmp] 18 bmp Vt Set:   [470 mL] 470 mL PEEP:  [5 cmH20] 5 cmH20 Pressure Support:  [5 cmH20] 5 cmH20 Plateau Pressure:  [15 cmH20] 15 cmH20   LAB RESULTS:  Basic Metabolic Panel: Recent Labs  Lab 08/19/23 1623 08/20/23 0323 08/21/23 0450 08/21/23 0614 08/21/23 1347 08/21/23 2120 08/22/23 0414 08/23/23 0435  NA  --  139 133* 141  --   --  142 143  K  --  4.1 2.8* 3.2*  --  3.3* 3.6 3.7  CL  --  107 105 112*  --   --  112* 105  CO2  --  22 22 23   --   --  25 28  GLUCOSE  --  177* 537* 127*  --   --  156* 131*  BUN  --  33* 15 16  --   --  9 15  CREATININE  --  1.39* 0.72 0.76  --   --  0.69 0.62  CALCIUM  --  8.3* 6.9* 7.8*  --   --  8.0* 8.5*  MG 1.8  --  1.7  --  1.7  --  2.2 2.0  PHOS 3.3 3.5 1.6*  --  15.1* 3.4 2.9 3.6   Liver Function Tests: Recent Labs  Lab 08/17/23 0415 08/17/23 0511 08/19/23 0324 08/20/23 0323 08/21/23 0450 08/22/23 0414 08/23/23 0435  AST 19  --   --   --   --   --   --   ALT QUANTITY NOT SUFFICIENT, UNABLE TO PERFORM TEST 11  --   --   --   --   --   ALKPHOS 81  --   --   --   --   --   --   BILITOT 0.5  --   --   --   --   --   --   PROT 7.2  --   --   --   --   --   --   ALBUMIN 3.7  --  3.7 2.9* 2.2* 2.4* 2.6*   Recent Labs  Lab 08/17/23 0415  LIPASE 30   No results for input(s): "AMMONIA" in the last 168 hours. CBC: Recent Labs  Lab 08/19/23 0324 08/20/23 0323 08/21/23 0450 08/22/23 0414 08/23/23 0435  WBC 16.2* 22.4* 9.4 6.9 6.6  HGB 14.6 12.3 9.6* 10.0* 11.2*  HCT 42.7 37.6 29.5* 30.9* 35.5*  MCV 87.9 92.4 93.1 91.7 94.7  PLT 235 233 174 175 185   Cardiac Enzymes: No results for input(s): "CKTOTAL", "CKMB", "CKMBINDEX", "TROPONINI" in the last 168 hours. BNP: Invalid input(s): "POCBNP" CBG: Recent Labs  Lab 08/22/23 1130 08/22/23 1602 08/22/23 2008 08/22/23 2330 08/23/23 0356  GLUCAP 109* 107* 109* 126* 120*       IMAGING RESULTS:     ASSESSMENT AND PLAN    -Multidisciplinary rounds held today  #Acute Hypoxic  Respiratory Failure secondary to suspected drug overdose of undetermined intent & suspected Aspiration #Post extubation Stridor ~ TREATED  PMHx: COPD, Asthma, OSA EXTUBATED 1/24 & Reintubated 1/25 -Full vent support, implement lung protective strategies -Plateau pressures less than 30 cm H20 -Wean FiO2 & PEEP as tolerated to maintain O2 sats >92% -Follow intermittent Chest X-ray & ABG as needed -Spontaneous Breathing Trials when respiratory parameters met and mental status permits -Implement VAP Bundle -Prn Bronchodilators -ABX -zosyn >> augmentin- staph aureus -noted thick secretions per ETT recultured +glycopyrollate    #Hypotension: Septic shock vs Sedation related Echocardiogram: 08/17/23:  LVEF 60-65%, normal diastolic parameters, RV systolic function normal -Continuous cardiac monitoring -Maintain MAP >65 -IV fluids -Vasopressors as needed to maintain MAP goal - Midodrine 10 mg TID>>reduced to 5 tid  -Lactic acid is normalized -HS Troponin negative x2 -hold outpatient losartan  -IVF - LR 1L bolus   #Multifocal Pneumonia, questionable aspiration -Monitor fever curve -Trend WBC's & Procalcitonin -Follow cultures as above    #Acute Kidney Injury #Hypokalemia ~ RESOLVED #Mild NAGMA ~ RESOLVED -Monitor I&O's / urinary output -Follow BMP -Ensure adequate renal perfusion -Avoid nephrotoxic agents as able -Replace electrolytes as indicated ~ Pharmacy following for assistance with electrolyte replacement -IV fluids -Consider Renal Ultrasound   #Type 2 Diabetes Mellitus Hemoglobin A1C: 6.3 (05/2023) -CBG's q4h; Target range of 140 to 180 -SSI -Follow ICU Hypo/Hyperglycemia protocol   #Acute Encephalopathy suspect secondary to drug overdose of undetermined intent in the setting of known history of Cocaine Abuse #ETOH use with Delirium Tremens #Sedation needs in setting of mechanical ventilation #Anxiety & Depression #Bipolar Disorder UDS + cocaine, marijuana &  amphetamines. Blood Alcohol level elevated at 190 -Maintain a RASS goal of 0 to -1 -Fentanyl and Propofol as needed to maintain RASS goal -Avoid sedating medications as able -  Daily wake up assessment -CIWA protocol -Continue Phenobarb taper -High dose thiamine x3 days followed by 100 mg daily -Folic acid and MVI -Hold outpatient regimen: Seroquel, effexor xr, vraylar. Consider restarting as patient stabilizes -once stabilized will need to screen for suicidal ideation         Best Practice (right click and "Reselect all SmartList Selections" daily)  Diet/type: NPO w/ meds via tube DVT prophylaxis LMWH Pressure ulcer(s): N/A GI prophylaxis: H2B Lines: N/A Foley:  Yes, and it is still needed Code Status:  full code Last date of multidisciplinary goals of care discussion [1/26]    ID -continue IV abx as prescibed -follow up cultures  GI/Nutrition GI PROPHYLAXIS as indicated DIET-->TF's as tolerated Constipation protocol as indicated  ENDO - ICU hypoglycemic\Hyperglycemia protocol -check FSBS per protocol   ELECTROLYTES -follow labs as needed -replace as needed -pharmacy consultation   DVT/GI PRX ordered -SCDs  TRANSFUSIONS AS NEEDED MONITOR FSBS ASSESS the need for LABS as needed    Critical care provider statement:   Total critical care time: 33 minutes   Performed by: Karna Christmas MD   Critical care time was exclusive of separately billable procedures and treating other patients.   Critical care was necessary to treat or prevent imminent or life-threatening deterioration.   Critical care was time spent personally by me on the following activities: development of treatment plan with patient and/or surrogate as well as nursing, discussions with consultants, evaluation of patient's response to treatment, examination of patient, obtaining history from patient or surrogate, ordering and performing treatments and interventions, ordering and review of laboratory  studies, ordering and review of radiographic studies, pulse oximetry and re-evaluation of patient's condition.    Vida Rigger, M.D.  Pulmonary & Critical Care Medicine

## 2023-08-23 NOTE — Consult Note (Signed)
PHARMACY CONSULT NOTE - ELECTROLYTES  Pharmacy Consult for Electrolyte Monitoring and Replacement   Recent Labs: Potassium (mmol/L)  Date Value  08/23/2023 3.7  10/15/2012 3.0 (L)   Magnesium (mg/dL)  Date Value  40/98/1191 2.0  10/04/2011 1.4 (L)   Calcium (mg/dL)  Date Value  47/82/9562 8.5 (L)   Calcium, Total (mg/dL)  Date Value  13/02/6577 8.3 (L)   Albumin (g/dL)  Date Value  46/96/2952 2.6 (L)  06/13/2023 4.3  10/15/2012 3.9   Phosphorus (mg/dL)  Date Value  84/13/2440 3.6   Sodium (mmol/L)  Date Value  08/23/2023 143  06/13/2023 142  10/15/2012 141   Height: 5\' 6"  (167.6 cm) Weight: 91.7 kg (202 lb 2.6 oz) IBW/kg (Calculated) : 59.3 Estimated Creatinine Clearance: 91.8 mL/min (by C-G formula based on SCr of 0.62 mg/dL).  Assessment  Andrea Sanford is a 53 y.o. female presenting with drug overdose / intoxication. PMH significant for polysubstance abuse, anxiety / depression, asthma / COPD, bipolar disorder, DM, neuromuscular disorder. Pharmacy has been consulted to monitor and replace electrolytes.  Goal of Therapy: Electrolytes within normal limits  Plan:  No electrolyte replacement warranted for today Electrolytes with labs tomorrow AM  Thank you for allowing pharmacy to be a part of this patient's care.  [name] 08/23/2023 7:17 AM

## 2023-08-23 NOTE — Progress Notes (Signed)
Pt. Extubated to 5l [place].

## 2023-08-23 NOTE — Plan of Care (Signed)
  Problem: Education: Goal: Ability to describe self-care measures that may prevent or decrease complications (Diabetes Survival Skills Education) will improve Outcome: Not Progressing   Problem: Coping: Goal: Ability to adjust to condition or change in health will improve Outcome: Progressing   Problem: Fluid Volume: Goal: Ability to maintain a balanced intake and output will improve Outcome: Progressing   Problem: Health Behavior/Discharge Planning: Goal: Ability to identify and utilize available resources and services will improve Outcome: Progressing Goal: Ability to manage health-related needs will improve Outcome: Progressing   Problem: Metabolic: Goal: Ability to maintain appropriate glucose levels will improve Outcome: Progressing   Problem: Nutritional: Goal: Maintenance of adequate nutrition will improve Outcome: Progressing Goal: Progress toward achieving an optimal weight will improve Outcome: Progressing   Problem: Skin Integrity: Goal: Risk for impaired skin integrity will decrease Outcome: Progressing   Problem: Tissue Perfusion: Goal: Adequacy of tissue perfusion will improve Outcome: Progressing   Problem: Education: Goal: Knowledge of General Education information will improve Description: Including pain rating scale, medication(s)/side effects and non-pharmacologic comfort measures Outcome: Progressing   Problem: Health Behavior/Discharge Planning: Goal: Ability to manage health-related needs will improve Outcome: Progressing   Problem: Clinical Measurements: Goal: Ability to maintain clinical measurements within normal limits will improve Outcome: Progressing Goal: Will remain free from infection Outcome: Progressing Goal: Diagnostic test results will improve Outcome: Progressing Goal: Respiratory complications will improve Outcome: Progressing Goal: Cardiovascular complication will be avoided Outcome: Progressing   Problem:  Activity: Goal: Risk for activity intolerance will decrease Outcome: Progressing   Problem: Nutrition: Goal: Adequate nutrition will be maintained Outcome: Progressing   Problem: Coping: Goal: Level of anxiety will decrease Outcome: Progressing   Problem: Elimination: Goal: Will not experience complications related to bowel motility Outcome: Progressing Goal: Will not experience complications related to urinary retention Outcome: Progressing   Problem: Pain Managment: Goal: General experience of comfort will improve and/or be controlled Outcome: Progressing   Problem: Safety: Goal: Ability to remain free from injury will improve Outcome: Progressing   Problem: Skin Integrity: Goal: Risk for impaired skin integrity will decrease Outcome: Progressing   Problem: Education: Goal: Knowledge of disease or condition will improve Outcome: Progressing Goal: Knowledge of the prescribed therapeutic regimen will improve Outcome: Progressing Goal: Individualized Educational Video(s) Outcome: Progressing   Problem: Activity: Goal: Ability to tolerate increased activity will improve Outcome: Progressing Goal: Will verbalize the importance of balancing activity with adequate rest periods Outcome: Progressing   Problem: Respiratory: Goal: Ability to maintain a clear airway will improve Outcome: Progressing Goal: Levels of oxygenation will improve Outcome: Progressing Goal: Ability to maintain adequate ventilation will improve Outcome: Progressing   Problem: Activity: Goal: Ability to tolerate increased activity will improve Outcome: Progressing   Problem: Respiratory: Goal: Ability to maintain a clear airway and adequate ventilation will improve Outcome: Progressing   Problem: Role Relationship: Goal: Method of communication will improve Outcome: Progressing

## 2023-08-23 NOTE — Plan of Care (Signed)
  Problem: Education: Goal: Ability to describe self-care measures that may prevent or decrease complications (Diabetes Survival Skills Education) will improve Outcome: Progressing Goal: Individualized Educational Video(s) Outcome: Progressing   Problem: Coping: Goal: Ability to adjust to condition or change in health will improve Outcome: Progressing   Problem: Fluid Volume: Goal: Ability to maintain a balanced intake and output will improve Outcome: Progressing   Problem: Health Behavior/Discharge Planning: Goal: Ability to identify and utilize available resources and services will improve Outcome: Progressing Goal: Ability to manage health-related needs will improve Outcome: Progressing   Problem: Metabolic: Goal: Ability to maintain appropriate glucose levels will improve Outcome: Progressing   Problem: Nutritional: Goal: Maintenance of adequate nutrition will improve Outcome: Progressing Goal: Progress toward achieving an optimal weight will improve Outcome: Progressing   Problem: Skin Integrity: Goal: Risk for impaired skin integrity will decrease Outcome: Progressing   Problem: Tissue Perfusion: Goal: Adequacy of tissue perfusion will improve Outcome: Progressing   Problem: Education: Goal: Knowledge of General Education information will improve Description: Including pain rating scale, medication(s)/side effects and non-pharmacologic comfort measures Outcome: Progressing   Problem: Health Behavior/Discharge Planning: Goal: Ability to manage health-related needs will improve Outcome: Progressing   Problem: Clinical Measurements: Goal: Ability to maintain clinical measurements within normal limits will improve Outcome: Progressing Goal: Will remain free from infection Outcome: Progressing Goal: Diagnostic test results will improve Outcome: Progressing Goal: Respiratory complications will improve Outcome: Progressing Goal: Cardiovascular complication will  be avoided Outcome: Progressing   Problem: Activity: Goal: Risk for activity intolerance will decrease Outcome: Progressing   Problem: Nutrition: Goal: Adequate nutrition will be maintained Outcome: Progressing   Problem: Coping: Goal: Level of anxiety will decrease Outcome: Progressing   Problem: Elimination: Goal: Will not experience complications related to bowel motility Outcome: Progressing Goal: Will not experience complications related to urinary retention Outcome: Progressing   Problem: Pain Managment: Goal: General experience of comfort will improve and/or be controlled Outcome: Progressing   Problem: Safety: Goal: Ability to remain free from injury will improve Outcome: Progressing   Problem: Skin Integrity: Goal: Risk for impaired skin integrity will decrease Outcome: Progressing   Problem: Education: Goal: Knowledge of disease or condition will improve Outcome: Progressing Goal: Knowledge of the prescribed therapeutic regimen will improve Outcome: Progressing Goal: Individualized Educational Video(s) Outcome: Progressing   Problem: Activity: Goal: Ability to tolerate increased activity will improve Outcome: Progressing Goal: Will verbalize the importance of balancing activity with adequate rest periods Outcome: Progressing   Problem: Respiratory: Goal: Ability to maintain a clear airway will improve Outcome: Progressing Goal: Levels of oxygenation will improve Outcome: Progressing Goal: Ability to maintain adequate ventilation will improve Outcome: Progressing   Problem: Activity: Goal: Ability to tolerate increased activity will improve Outcome: Progressing   Problem: Respiratory: Goal: Ability to maintain a clear airway and adequate ventilation will improve Outcome: Progressing   Problem: Role Relationship: Goal: Method of communication will improve Outcome: Progressing

## 2023-08-23 NOTE — Progress Notes (Signed)
Nutrition Follow-up  DOCUMENTATION CODES:   Obesity unspecified  INTERVENTION:   Change to Osmolite 1.5@60ml /hr continuous + ProSource TF 20- Give 60ml daily via tube  Free water flushes 30ml q4 hours to maintain tube patency   Regimen provides 2240kcal/day, 110g/day protein and 1245ml/day of free water.   Juven Fruit Punch BID via tube, each serving provides 95kcal and 2.5g of protein (amino acids glutamine and arginine)  Daily weights   NUTRITION DIAGNOSIS:   Inadequate oral intake related to inability to eat as evidenced by NPO status. -ongoing   GOAL:   Patient will meet greater than or equal to 90% of their needs -met   MONITOR:   Diet advancement, Labs, Weight trends, TF tolerance, I & O's, Skin  ASSESSMENT:   53 y/o female with h/o DM, bipolar disorder, substance abuse, anxiety, depression, COPD, OSA and homelessness who is admitted with AKI, overdose and aspiration PNA.  Pt extubated this morning. NGT remains in place. Tube feeds held for extubation. Pt is agitated today; will plan to resume tube feeds once patient is able to sit up. Refeed labs stable. Per chart, pt is up ~7lbs since admission. Pt -2.1L on her I & Os.   Medications reviewed and include: augmentin, colace, lovenox, pepcid, folic acid, robinul, insulin, midodrine, MVI, juven, protonix, miralax, thiamine  Labs reviewed: K 3.7 wnl, P 3.6 wnl, Mg 2.0 wnl Hgb 11.2(L), Hct 35.5(L) Cbgs- 120, 126, 109, 107 x 48 hrs   UOP-  Diet Order:   Diet Order             Diet NPO time specified  Diet effective now                  EDUCATION NEEDS:   Not appropriate for education at this time  Skin:  Skin Assessment: Reviewed RN Assessment (Stage II buttocks, laceration L knee) Skin Integrity Issues:: Stage I Stage I: coccyx  Last BM:  1/28- TYPE 7  Height:   Ht Readings from Last 1 Encounters:  08/17/23 5\' 6"  (1.676 m)    Weight:   Wt Readings from Last 1 Encounters:  08/23/23  91.7 kg    Ideal Body Weight:  61.4 kg  BMI:  Body mass index is 32.63 kg/m.  Estimated Nutritional Needs:   Kcal:  1900-2200kcal/day  Protein:  95-110g/day  Fluid:  1.8-2.1L/day  Betsey Holiday MS, RD, LDN If unable to be reached, please send secure chat to "RD inpatient" available from 8:00a-4:00p daily

## 2023-08-23 NOTE — Progress Notes (Signed)
1: 1 sitter observation has been initiated since the patient has been extubated and sedation has been turned off. The patient continues to try to get out of bed. MD has placed 1:1 sitter orders to prevents falls, pulling off lines and foley catheter or NG tube.

## 2023-08-24 LAB — CULTURE, BAL-QUANTITATIVE W GRAM STAIN

## 2023-08-24 LAB — CULTURE, BLOOD (ROUTINE X 2)
Culture: NO GROWTH
Culture: NO GROWTH

## 2023-08-24 LAB — GLUCOSE, CAPILLARY
Glucose-Capillary: 122 mg/dL — ABNORMAL HIGH (ref 70–99)
Glucose-Capillary: 112 mg/dL — ABNORMAL HIGH (ref 70–99)
Glucose-Capillary: 121 mg/dL — ABNORMAL HIGH (ref 70–99)
Glucose-Capillary: 130 mg/dL — ABNORMAL HIGH (ref 70–99)
Glucose-Capillary: 155 mg/dL — ABNORMAL HIGH (ref 70–99)
Glucose-Capillary: 75 mg/dL (ref 70–99)

## 2023-08-24 LAB — CULTURE, RESPIRATORY W GRAM STAIN

## 2023-08-24 LAB — CBC
HCT: 35.4 % — ABNORMAL LOW (ref 36.0–46.0)
Hemoglobin: 11.8 g/dL — ABNORMAL LOW (ref 12.0–15.0)
MCH: 29.9 pg (ref 26.0–34.0)
MCHC: 33.3 g/dL (ref 30.0–36.0)
MCV: 89.6 fL (ref 80.0–100.0)
Platelets: 192 10*3/uL (ref 150–400)
RBC: 3.95 MIL/uL (ref 3.87–5.11)
RDW: 11.9 % (ref 11.5–15.5)
WBC: 9.3 10*3/uL (ref 4.0–10.5)
nRBC: 0 % (ref 0.0–0.2)

## 2023-08-24 LAB — RENAL FUNCTION PANEL
Albumin: 2.8 g/dL — ABNORMAL LOW (ref 3.5–5.0)
Anion gap: 6 (ref 5–15)
BUN: 12 mg/dL (ref 6–20)
CO2: 28 mmol/L (ref 22–32)
Calcium: 8.6 mg/dL — ABNORMAL LOW (ref 8.9–10.3)
Chloride: 107 mmol/L (ref 98–111)
Creatinine, Ser: 0.53 mg/dL (ref 0.44–1.00)
GFR, Estimated: >60 mL/min (ref >60)
Glucose, Bld: 126 mg/dL — ABNORMAL HIGH (ref 70–99)
Phosphorus: 3.5 mg/dL (ref 2.5–4.6)
Potassium: 3.8 mmol/L (ref 3.5–5.1)
Sodium: 141 mmol/L (ref 135–145)

## 2023-08-24 LAB — MAGNESIUM: Magnesium: 1.8 mg/dL (ref 1.7–2.4)

## 2023-08-24 MED ORDER — ORAL CARE MOUTH RINSE
15.0000 mL | OROMUCOSAL | Status: DC
  Administered 2023-08-24 – 2023-08-26 (×8): 15 mL via OROMUCOSAL

## 2023-08-24 MED ORDER — HYDROMORPHONE HCL 1 MG/ML IJ SOLN
INTRAMUSCULAR | Status: AC
  Filled 2023-08-24: qty 1

## 2023-08-24 MED ORDER — ORAL CARE MOUTH RINSE
15.0000 mL | OROMUCOSAL | Status: DC | PRN
  Administered 2023-08-26: 15 mL via OROMUCOSAL

## 2023-08-24 MED ORDER — HYDROMORPHONE HCL 1 MG/ML IJ SOLN
0.5000 mg | Freq: Once | INTRAMUSCULAR | Status: AC
  Administered 2023-08-24: 0.5 mg via INTRAVENOUS

## 2023-08-24 MED ORDER — VALPROATE SODIUM 100 MG/ML IV SOLN
250.0000 mg | Freq: Three times a day (TID) | INTRAVENOUS | Status: DC
  Administered 2023-08-24 – 2023-08-25 (×3): 250 mg via INTRAVENOUS
  Filled 2023-08-24 (×4): qty 2.5

## 2023-08-24 MED ORDER — PHENOBARBITAL SODIUM 130 MG/ML IJ SOLN
130.0000 mg | Freq: Once | INTRAMUSCULAR | Status: AC
  Administered 2023-08-24: 130 mg via INTRAVENOUS
  Filled 2023-08-24: qty 1

## 2023-08-24 MED ORDER — DEXMEDETOMIDINE HCL IN NACL 400 MCG/100ML IV SOLN
0.0000 ug/kg/h | INTRAVENOUS | Status: DC
  Administered 2023-08-24 (×4): 1.2 ug/kg/h via INTRAVENOUS
  Administered 2023-08-25: 1 ug/kg/h via INTRAVENOUS
  Administered 2023-08-25 (×2): 1.2 ug/kg/h via INTRAVENOUS
  Administered 2023-08-25: 1 ug/kg/h via INTRAVENOUS
  Filled 2023-08-24 (×8): qty 100

## 2023-08-24 NOTE — Consult Note (Signed)
PHARMACY CONSULT NOTE - ELECTROLYTES  Pharmacy Consult for Electrolyte Monitoring and Replacement   Recent Labs: Potassium (mmol/L)  Date Value  08/24/2023 3.8  10/15/2012 3.0 (L)   Magnesium (mg/dL)  Date Value  98/05/9146 1.8  10/04/2011 1.4 (L)   Calcium (mg/dL)  Date Value  82/95/6213 8.6 (L)   Calcium, Total (mg/dL)  Date Value  08/65/7846 8.3 (L)   Albumin (g/dL)  Date Value  96/29/5284 2.8 (L)  06/13/2023 4.3  10/15/2012 3.9   Phosphorus (mg/dL)  Date Value  13/24/4010 3.5   Sodium (mmol/L)  Date Value  08/24/2023 141  06/13/2023 142  10/15/2012 141   Height: 5\' 6"  (167.6 cm) Weight: 88.4 kg (194 lb 14.2 oz) IBW/kg (Calculated) : 59.3 Estimated Creatinine Clearance: 90 mL/min (by C-G formula based on SCr of 0.53 mg/dL).  Assessment  Andrea Sanford is a 53 y.o. female presenting with drug overdose / intoxication. PMH significant for polysubstance abuse, anxiety / depression, asthma / COPD, bipolar disorder, DM, neuromuscular disorder. Pharmacy has been consulted to monitor and replace electrolytes.  Goal of Therapy: Electrolytes within normal limits  Plan:  No electrolyte replacement warranted for today Electrolytes with labs tomorrow AM  Thank you for allowing pharmacy to be a part of this patient's care.  [name] 08/24/2023 7:12 AM

## 2023-08-24 NOTE — Plan of Care (Signed)
  Problem: Education: Goal: Ability to describe self-care measures that may prevent or decrease complications (Diabetes Survival Skills Education) will improve Outcome: Not Progressing   Problem: Coping: Goal: Ability to adjust to condition or change in health will improve Outcome: Not Progressing   Problem: Fluid Volume: Goal: Ability to maintain a balanced intake and output will improve Outcome: Not Progressing   Problem: Health Behavior/Discharge Planning: Goal: Ability to identify and utilize available resources and services will improve Outcome: Not Progressing Goal: Ability to manage health-related needs will improve Outcome: Not Progressing   Problem: Metabolic: Goal: Ability to maintain appropriate glucose levels will improve Outcome: Not Progressing   Problem: Nutritional: Goal: Maintenance of adequate nutrition will improve Outcome: Not Progressing Goal: Progress toward achieving an optimal weight will improve Outcome: Not Progressing   Problem: Skin Integrity: Goal: Risk for impaired skin integrity will decrease Outcome: Not Progressing   Problem: Tissue Perfusion: Goal: Adequacy of tissue perfusion will improve Outcome: Not Progressing   Problem: Education: Goal: Knowledge of General Education information will improve Description: Including pain rating scale, medication(s)/side effects and non-pharmacologic comfort measures Outcome: Not Progressing   Problem: Health Behavior/Discharge Planning: Goal: Ability to manage health-related needs will improve Outcome: Not Progressing   Problem: Clinical Measurements: Goal: Ability to maintain clinical measurements within normal limits will improve Outcome: Not Progressing Goal: Will remain free from infection Outcome: Not Progressing Goal: Diagnostic test results will improve Outcome: Not Progressing Goal: Respiratory complications will improve Outcome: Not Progressing Goal: Cardiovascular complication will  be avoided Outcome: Not Progressing   Problem: Activity: Goal: Risk for activity intolerance will decrease Outcome: Not Progressing   Problem: Nutrition: Goal: Adequate nutrition will be maintained Outcome: Not Progressing   Problem: Coping: Goal: Level of anxiety will decrease Outcome: Not Progressing   Problem: Elimination: Goal: Will not experience complications related to bowel motility Outcome: Not Progressing Goal: Will not experience complications related to urinary retention Outcome: Not Progressing   Problem: Pain Managment: Goal: General experience of comfort will improve and/or be controlled Outcome: Not Progressing   Problem: Safety: Goal: Ability to remain free from injury will improve Outcome: Not Progressing   Problem: Skin Integrity: Goal: Risk for impaired skin integrity will decrease Outcome: Not Progressing   Problem: Education: Goal: Knowledge of disease or condition will improve Outcome: Not Progressing Goal: Knowledge of the prescribed therapeutic regimen will improve Outcome: Not Progressing   Problem: Activity: Goal: Ability to tolerate increased activity will improve Outcome: Not Progressing Goal: Will verbalize the importance of balancing activity with adequate rest periods Outcome: Not Progressing   Problem: Respiratory: Goal: Ability to maintain a clear airway will improve Outcome: Not Progressing Goal: Levels of oxygenation will improve Outcome: Not Progressing Goal: Ability to maintain adequate ventilation will improve Outcome: Not Progressing   Problem: Activity: Goal: Ability to tolerate increased activity will improve Outcome: Not Progressing   Problem: Respiratory: Goal: Ability to maintain a clear airway and adequate ventilation will improve Outcome: Not Progressing   Problem: Role Relationship: Goal: Method of communication will improve Outcome: Not Progressing

## 2023-08-24 NOTE — Plan of Care (Signed)
  Problem: Education: Goal: Ability to describe self-care measures that may prevent or decrease complications (Diabetes Survival Skills Education) will improve Outcome: Progressing Goal: Individualized Educational Video(s) Outcome: Progressing   Problem: Coping: Goal: Ability to adjust to condition or change in health will improve Outcome: Progressing   Problem: Fluid Volume: Goal: Ability to maintain a balanced intake and output will improve Outcome: Progressing   Problem: Health Behavior/Discharge Planning: Goal: Ability to identify and utilize available resources and services will improve Outcome: Progressing Goal: Ability to manage health-related needs will improve Outcome: Progressing   Problem: Metabolic: Goal: Ability to maintain appropriate glucose levels will improve Outcome: Progressing   Problem: Nutritional: Goal: Maintenance of adequate nutrition will improve Outcome: Progressing Goal: Progress toward achieving an optimal weight will improve Outcome: Progressing   Problem: Skin Integrity: Goal: Risk for impaired skin integrity will decrease Outcome: Progressing   Problem: Tissue Perfusion: Goal: Adequacy of tissue perfusion will improve Outcome: Progressing   Problem: Education: Goal: Knowledge of General Education information will improve Description: Including pain rating scale, medication(s)/side effects and non-pharmacologic comfort measures Outcome: Progressing   Problem: Health Behavior/Discharge Planning: Goal: Ability to manage health-related needs will improve Outcome: Progressing   Problem: Clinical Measurements: Goal: Ability to maintain clinical measurements within normal limits will improve Outcome: Progressing Goal: Will remain free from infection Outcome: Progressing Goal: Diagnostic test results will improve Outcome: Progressing Goal: Respiratory complications will improve Outcome: Progressing Goal: Cardiovascular complication will  be avoided Outcome: Progressing   Problem: Activity: Goal: Risk for activity intolerance will decrease Outcome: Progressing   Problem: Nutrition: Goal: Adequate nutrition will be maintained Outcome: Progressing   Problem: Coping: Goal: Level of anxiety will decrease Outcome: Progressing   Problem: Elimination: Goal: Will not experience complications related to bowel motility Outcome: Progressing Goal: Will not experience complications related to urinary retention Outcome: Progressing   Problem: Pain Managment: Goal: General experience of comfort will improve and/or be controlled Outcome: Progressing   Problem: Safety: Goal: Ability to remain free from injury will improve Outcome: Progressing   Problem: Skin Integrity: Goal: Risk for impaired skin integrity will decrease Outcome: Progressing   Problem: Education: Goal: Knowledge of disease or condition will improve Outcome: Progressing Goal: Knowledge of the prescribed therapeutic regimen will improve Outcome: Progressing Goal: Individualized Educational Video(s) Outcome: Progressing   Problem: Activity: Goal: Ability to tolerate increased activity will improve Outcome: Progressing Goal: Will verbalize the importance of balancing activity with adequate rest periods Outcome: Progressing   Problem: Respiratory: Goal: Ability to maintain a clear airway will improve Outcome: Progressing Goal: Levels of oxygenation will improve Outcome: Progressing Goal: Ability to maintain adequate ventilation will improve Outcome: Progressing   Problem: Activity: Goal: Ability to tolerate increased activity will improve Outcome: Progressing   Problem: Respiratory: Goal: Ability to maintain a clear airway and adequate ventilation will improve Outcome: Progressing   Problem: Role Relationship: Goal: Method of communication will improve Outcome: Progressing

## 2023-08-24 NOTE — Progress Notes (Signed)
CRITICAL CARE PROGRESS NOTE    Name: Andrea Sanford MRN: [id number] DOB: 03/29/1971     LOS: 7   SUBJECTIVE FINDINGS & SIGNIFICANT EVENTS    History of Presenting Illness:  -patient with polysubstance abuse including drugs and alcohol.  She was found unresponsive.  She has PMH of psychiatric conditions, lifelong smoking, homelesness, COPD, anxiety and depression.    ED course: Upon arrival patient obtunded, tachypneic, hypothermic and hypertensive being supported with a BVM. Patient emergently intubated for airway protection in the setting of suspected drug overdose, becoming hypotensive after induction requiring vasopressor support. Labs significant for hypokalemia and mild NAGMA.   Imaging revealed vascular congestion on CXR with no acute abnormality on CTH.  Medications given: etomidate & succinylcholine, 3 L bolus, levophed drip started Initial Vitals: 95.1, 22, 95, 168/138, 90% BVM Significant labs: (Labs/ Imaging personally reviewed) I, [name], AGACNP-BC, personally viewed and interpreted this ECG. EKG Interpretation: Date: 08/17/23, EKG Time: 04:45, Rate: 77, Rhythm: NSR, QRS Axis:  normal, Intervals: borderline prolonged Qtc, ST/T Wave abnormalities: very mild STE in leads I, II and avL (does not meet STEMI criteria), Narrative Interpretation: NSR Chemistry: Na+:138, K+: 3.1, BUN/Cr.: 11/0.84, Serum CO2/ AG: 20/ 11 Hematology: WBC: 9.8, Hgb: 13.6,  Troponin: 6, BNP: pending, Lactic/ PCT: 0.9/pending,  COVID-19 & Influenza A/B: pending   Alcohol level: 190, Acetaminophen: <10, Salicylate: <7 UDS: +amphetamines, marijuana, cocaine   ABG: 7.27/49/173/22.5 CXR 08/17/23:  Low volume film with vascular congestion. Large gastric bubble despite the presence of an NG tube. CT head wo contrast  08/17/23: No acute intracranial abnormality identified. Mild but progressed cerebral white matter changes since 2019, most commonly due to small vessel disease. Chronic left facial fractures.   08/21/23- patient with heavy dark inspissated ETT secretions with bronchospasm. RT worried about increased resistance on MV due to thick resp mucus. We plan to perform bronchoscopy I reviewed medical findings with [place] mother of patient .  08/22/23- patient failed SBT today, failed with AMS and hypoxemia 08/23/23- mentation is improved, SBT passed.  For liberation today.  High risk for withdrawal from drug/alcohol abuse.  Post  extubation with aggitation Water engineer. Trach aspirate with staph aureus.  08/24/23- tube feeding stopped due to vomiting.  She's no longer hypotensive and has been weaned off pressors, she continus to have difficult to control withdrawal from alcohol and drugs and is on max dose precedex.  We have refined therapy for this today with goal to wean off gtt.   Lines/tubes : Airway 8 mm (Active)  Secured at (cm) 22 cm 08/21/23 0826  Measured From Lips 08/21/23 0826  Secured Location Left 08/21/23 0826  Secured By [employer] 08/21/23 0826  Bite Block No 08/21/23 0826  Tube Holder Repositioned Yes 08/21/23 0826  Prone position No 08/21/23 0826  Cuff Pressure (cm H2O) Green OR 18-26 [facility] 08/21/23 0826  Site Condition Dry 08/21/23 0826     PICC Triple Lumen 08/20/23 Right Basilic 39 cm 0 cm (Active)  Indication for Insertion or Continuance of Line Vasoactive infusions 08/21/23 0733  Exposed Catheter (cm) 0 cm 08/21/23 0733  Site Assessment Clean, Dry, Intact 08/21/23 0733  Lumen #1 Status In-line blood sampling system in place 08/21/23 0733  Lumen #2 Status Flushed;Infusing;Blood return noted 08/21/23 0733  Lumen #3 Status Infusing;Flushed;Blood return noted 08/21/23 0733  Dressing Type Transparent;Securing device 08/21/23 0733  Dressing Status Antimicrobial  disc/dressing in place 08/21/23 0733  Line Care Connections checked and tightened 08/21/23 0733  Line Adjustment (NICU/IV Team Only) No 08/20/23 1029  Dressing Intervention New dressing;Adhesive placed at insertion site (IV team only);Adhesive placed around edges of dressing (IV team/ICU RN only) 08/20/23 1029  Dressing Change Due 08/27/23 08/21/23 0733     NG/OG Vented/Dual Lumen 14 Fr. Right nare Marking at nare/corner of mouth 70 cm (Active)  Tube Position (Required) Marking at nare/corner of mouth 08/21/23 0736  Measurement (cm) (Required) 70 cm 08/21/23 0736  Ongoing Placement Verification (Required) (See row information) Yes 08/21/23 0736  Site Assessment Clean, Dry, Intact 08/21/23 0736  Interventions Clamped 08/21/23 0736  Status Clamped 08/21/23 0736  Intake (mL) 30 mL 08/21/23 0736  Output (mL) 0 mL 08/21/23 0600     Urethral Catheter C. Vazquez Non-latex 14 Fr. (Active)  Indication for Insertion or Continuance of Catheter Acute urinary retention (I&O Cath for 24 hrs prior to catheter insertion- Inpatient Only) 08/21/23 0736  Site Assessment Clean, Dry, Intact 08/21/23 0736  Catheter Maintenance Bag below level of bladder;Catheter secured;Drainage bag/tubing not touching floor;Insertion date on drainage bag;No dependent loops;Seal intact;Bag emptied prior to transport 08/21/23 0736  Collection Container Standard drainage bag 08/21/23 0736  Securement Method Adhesive securement device 08/21/23 0736  Output (mL) 70 mL 08/21/23 0600    Microbiology/Sepsis markers: Results for orders placed or performed during the hospital encounter of 08/17/23  Culture, blood (routine x 2)     Status: None   Collection Time: 08/17/23  4:52 AM   Specimen: BLOOD  Result Value Ref Range Status   Specimen Description BLOOD LEFT ANTECUBITAL  Final   Special Requests   Final    BOTTLES DRAWN AEROBIC AND ANAEROBIC Blood Culture results may not be optimal due to an excessive volume of blood received  in culture bottles   Culture   Final    NO GROWTH 5 DAYS Performed at [facility], [address], [place], [place] [address]    Report Status 08/22/2023 FINAL  Final  Culture, blood (routine x 2)     Status: None   Collection Time: 08/17/23  4:52 AM   Specimen: BLOOD  Result Value Ref Range Status   Specimen Description BLOOD BLOOD LEFT HAND  Final   Special Requests   Final    BOTTLES DRAWN AEROBIC AND ANAEROBIC Blood Culture adequate volume   Culture   Final    NO GROWTH 5 DAYS Performed at [facility], [address] [place], [place] [address]    Report Status 08/22/2023 FINAL  Final  Resp panel by RT-PCR (RSV, Flu A&B, Covid) Anterior Nasal Swab     Status: None   Collection Time: 08/17/23  5:11 AM   Specimen: Anterior Nasal Swab  Result Value Ref Range Status   SARS Coronavirus 2 by RT PCR NEGATIVE NEGATIVE Final    Comment: (NOTE) SARS-CoV-2 target nucleic acids are NOT DETECTED.  The SARS-CoV-2 RNA is generally detectable in upper respiratory specimens during the acute phase of infection. The lowest concentration of SARS-CoV-2 viral copies this assay can detect is 138 copies/mL. A negative result does not preclude SARS-Cov-2 infection and should not be used as the sole basis for treatment or other patient management decisions. A negative result may occur with  improper specimen collection/handling, submission of specimen other than nasopharyngeal swab, presence of viral mutation(s) within the areas targeted by this assay, and inadequate number of viral copies(<138 copies/mL). A negative result must be combined with clinical observations, patient history, and epidemiological information. The expected result is Negative.  Fact Sheet for Patients:  BloggerCourse.com  Fact Sheet for Healthcare Providers:  SeriousBroker.it  This test is no t yet approved or cleared by the [place] FDA and   has been authorized for detection and/or diagnosis of SARS-CoV-2 by FDA under an Emergency Use Authorization (EUA). This EUA will remain  in effect (meaning this test can be used) for the duration of the COVID-19 declaration under Section 564(b)(1) of the Act, 21 U.S.C.section 360bbb-3(b)(1), unless the authorization is terminated  or revoked sooner.       Influenza A by PCR NEGATIVE NEGATIVE Final   Influenza B by PCR NEGATIVE NEGATIVE Final    Comment: (NOTE) The Xpert Xpress SARS-CoV-2/FLU/RSV plus assay is intended as an aid in the diagnosis of influenza from Nasopharyngeal swab specimens and should not be used as a sole basis for treatment. Nasal washings and aspirates are unacceptable for Xpert Xpress SARS-CoV-2/FLU/RSV testing.  Fact Sheet for Patients: BloggerCourse.com  Fact Sheet for Healthcare Providers: SeriousBroker.it  This test is not yet approved or cleared by the [place] FDA and has been authorized for detection and/or diagnosis of SARS-CoV-2 by FDA under an Emergency Use Authorization (EUA). This EUA will remain in effect (meaning this test can be used) for the duration of the COVID-19 declaration under Section 564(b)(1) of the Act, 21 U.S.C. section 360bbb-3(b)(1), unless the authorization is terminated or revoked.     Resp Syncytial Virus by PCR NEGATIVE NEGATIVE Final    Comment: (NOTE) Fact Sheet for Patients: BloggerCourse.com  Fact Sheet for Healthcare Providers: SeriousBroker.it  This test is not yet approved or cleared by the [place] FDA and has been authorized for detection and/or diagnosis of SARS-CoV-2 by FDA under an Emergency Use Authorization (EUA). This EUA will remain in effect (meaning this test can be used) for the duration of the COVID-19 declaration under Section 564(b)(1) of the Act, 21 U.S.C. section 360bbb-3(b)(1),  unless the authorization is terminated or revoked.  Performed at [facility], [address] [place], [place] [address]   MRSA Next Gen by PCR, Nasal     Status: None   Collection Time: 08/17/23  6:00 AM   Specimen: Nasal Mucosa; Nasal Swab  Result Value Ref Range Status   MRSA by PCR Next Gen NOT DETECTED NOT DETECTED Final    Comment: (NOTE) The GeneXpert MRSA Assay (FDA approved for NASAL specimens only), is one component of a comprehensive MRSA colonization surveillance program. It is not intended to diagnose MRSA infection nor to guide or monitor treatment for MRSA infections. Test performance is not FDA approved in patients less than 52 years old. Performed at [facility], [address] [place], [place] [address]   Culture, blood (Routine X 2) w Reflex to ID Panel     Status: None   Collection Time: 08/19/23  4:23 PM   Specimen: BLOOD  Result Value Ref Range Status   Specimen Description BLOOD BLOOD RIGHT ARM AEROBIC BOTTLE ONLY  Final   Special Requests   Final    BOTTLES DRAWN AEROBIC ONLY Blood Culture results may not be optimal due to an inadequate volume of blood received in culture bottles   Culture   Final    NO GROWTH 5 DAYS Performed at [facility], [address] [place], [place] [address]    Report Status 08/24/2023 FINAL  Final  Culture, blood (Routine X 2) w Reflex to ID Panel     Status: None   Collection Time: 08/19/23  4:23 PM   Specimen: BLOOD  Result Value Ref Range Status   Specimen Description BLOOD BLOOD RIGHT HAND AEROBIC BOTTLE ONLY  Final   Special Requests   Final    BOTTLES DRAWN AEROBIC ONLY Blood Culture results may not be optimal due to an inadequate volume of blood received in culture bottles   Culture   Final    NO GROWTH 5 DAYS Performed at [facility], [address] [place], [place] [address]    Report Status 08/24/2023 FINAL  Final  Respiratory (~20 pathogens) panel by PCR     Status:  None   Collection Time: 08/20/23 11:04 AM   Specimen: Nasopharyngeal Swab; Respiratory  Result Value Ref Range Status   Adenovirus NOT DETECTED NOT DETECTED Final   Coronavirus 229E NOT DETECTED NOT DETECTED Final    Comment: (NOTE) The Coronavirus on the Respiratory Panel, DOES NOT test for the novel  Coronavirus (2019 nCoV)    Coronavirus HKU1 NOT DETECTED NOT DETECTED Final   Coronavirus NL63 NOT DETECTED NOT DETECTED Final   Coronavirus OC43 NOT DETECTED NOT DETECTED Final   Metapneumovirus NOT DETECTED NOT DETECTED Final   Rhinovirus / Enterovirus NOT DETECTED NOT DETECTED Final   Influenza A NOT DETECTED NOT DETECTED Final   Influenza B NOT DETECTED NOT DETECTED Final   Parainfluenza Virus 1 NOT DETECTED NOT DETECTED Final   Parainfluenza Virus 2 NOT DETECTED NOT DETECTED Final   Parainfluenza Virus 3 NOT DETECTED NOT DETECTED Final   Parainfluenza Virus 4 NOT DETECTED NOT DETECTED Final   Respiratory Syncytial Virus NOT DETECTED NOT DETECTED Final   Bordetella pertussis NOT DETECTED NOT DETECTED Final   Bordetella Parapertussis NOT DETECTED NOT DETECTED Final   Chlamydophila pneumoniae NOT DETECTED NOT DETECTED Final   Mycoplasma pneumoniae NOT DETECTED NOT DETECTED Final    Comment: Performed at [facility], [address], [place], [place] [address]  Culture, Respiratory w Gram Stain     Status: None (Preliminary result)   Collection Time: 08/20/23  1:56 PM   Specimen: Tracheal Aspirate; Respiratory  Result Value Ref Range Status   Specimen Description   Final    TRACHEAL ASPIRATE Performed at [facility], [address], [place], [place] [address]    Special Requests   Final    NONE Performed at [facility], [address] [place], [place] [address]    Gram Stain   Final    FEW WBC PRESENT, PREDOMINANTLY PMN RARE GRAM POSITIVE COCCI    Culture   Final    FEW STAPHYLOCOCCUS AUREUS CONFIRMATION OF SUSCEPTIBILITIES IN  PROGRESS Performed at [facility], [address], [place], [place] [address]    Report Status PENDING  Incomplete   Organism ID, Bacteria STAPHYLOCOCCUS AUREUS  Final      Susceptibility   Staphylococcus aureus - MIC*    CIPROFLOXACIN <=0.5 SENSITIVE Sensitive     ERYTHROMYCIN >=8 RESISTANT Resistant     GENTAMICIN <=0.5 SENSITIVE Sensitive     OXACILLIN RESISTANT Resistant     TETRACYCLINE <=1 SENSITIVE Sensitive     VANCOMYCIN <=0.5 SENSITIVE Sensitive     TRIMETH/SULFA <=10 SENSITIVE Sensitive     CLINDAMYCIN <=0.25 SENSITIVE Sensitive     RIFAMPIN <=0.5 SENSITIVE Sensitive     Inducible Clindamycin NEGATIVE Sensitive     LINEZOLID 2 SENSITIVE Sensitive     * FEW STAPHYLOCOCCUS AUREUS  Culture, BAL-quantitative w Gram Stain     Status: Abnormal (Preliminary result)  Collection Time: 08/21/23  1:05 PM   Specimen: Bronchoalveolar Lavage; Respiratory  Result Value Ref Range Status   Specimen Description   Final    BRONCHIAL ALVEOLAR LAVAGE Performed at [facility], [address], [place], [place] [address]    Special Requests   Final    NONE Performed at [facility], [address] [place], [place] [address]    Gram Stain   Final    ABUNDANT WBC PRESENT, PREDOMINANTLY PMN NO ORGANISMS SEEN    Culture (A)  Final    10,000 COLONIES/mL STAPHYLOCOCCUS AUREUS SUSCEPTIBILITIES TO FOLLOW Performed at [facility], [address] [place], [place] [address]    Report Status PENDING  Incomplete    Anti-infectives:  Anti-infectives (From admission, onward)    Start     Dose/Rate Route Frequency Ordered Stop   08/23/23 1800  sulfamethoxazole-trimethoprim (BACTRIM) 200-40 MG/5ML suspension 20 mL        20 mL Per Tube Every 12 hours 08/23/23 1507     08/21/23 1800  amoxicillin-clavulanate (AUGMENTIN) 400-57 MG/5ML suspension 875 mg  Status:  Discontinued        875 mg Per Tube Every 12 hours 08/21/23 1148 08/23/23 1457   08/19/23 1730   piperacillin-tazobactam (ZOSYN) IVPB 3.375 g  Status:  Discontinued        3.375 g 12.5 mL/hr over 240 Minutes Intravenous Every 8 hours 08/19/23 1633 08/21/23 1146         PAST MEDICAL HISTORY   Past Medical History:  Diagnosis Date   Allergy    Anxiety    Arthritis    Asthma    Bipolar 1 disorder (HCC)    Cancer (HCC)    COPD (chronic obstructive pulmonary disease) (HCC)    Depression    Diabetes mellitus without complication (HCC)    Hypertension    Neuromuscular disorder (HCC)    Osteoporosis    Sleep apnea      SURGICAL HISTORY   Past Surgical History:  Procedure Laterality Date   ANKLE SURGERY Right    PERIPHERAL VASCULAR THROMBECTOMY Left 1991     FAMILY HISTORY   Family History  Problem Relation Age of Onset   Depression Mother    Diabetes Mother    Cancer Father      SOCIAL HISTORY   Social History   Tobacco Use   Smoking status: Every Day    Current packs/day: 1.00    Types: Cigarettes   Smokeless tobacco: Never  Substance Use Topics   Alcohol use: Not Currently    Alcohol/week: 2.0 standard drinks of alcohol    Types: 2 Standard drinks or equivalent per week   Drug use: Yes    Types: Cocaine    Comment: uses on occasions     MEDICATIONS   Current Medication:  Current Facility-Administered Medications:    acetaminophen (TYLENOL) tablet 650 mg, 650 mg, Per Tube, Q4H PRN, Rust-Chester, Britton L, NP, 650 mg at 08/21/23 1337   Chlorhexidine Gluconate Cloth 2 % PADS 6 each, 6 each, Topical, Daily, Kasa, Kurian, MD, 6 each at 08/24/23 0953   clonazepam (KLONOPIN) disintegrating tablet 1 mg, 1 mg, Per Tube, BID, Karna Christmas, Allyssa Abruzzese, MD, 1 mg at 08/24/23 1000   dexmedetomidine (PRECEDEX) 400 MCG/100ML (4 mcg/mL) infusion, 0-1.2 mcg/kg/hr, Intravenous, Continuous, [name], RPH, Last Rate: 24.3 mL/hr at 08/24/23 0953, 1.1 mcg/kg/hr at 08/24/23 0953   docusate (COLACE) 50 MG/5ML liquid 100 mg, 100 mg, Per Tube, BID PRN, Rust-Chester,  Cecelia Byars, NP   docusate (COLACE) 50 MG/5ML liquid 100 mg, 100 mg, Per Tube, BID, Belia Heman, Kurian, MD, 100 mg at 08/22/23 2123   enoxaparin (LOVENOX) injection 40 mg, 40 mg, Subcutaneous, Q24H, Tressie Ellis, RPH, 40 mg at 08/24/23 1001   famotidine (PEPCID) tablet 20 mg, 20 mg, Per Tube, BID, Erin Fulling, MD, 20 mg at 08/24/23 1008   feeding supplement (OSMOLITE 1.5 CAL) liquid 1,000 mL, 1,000 mL, Per Tube, Continuous, Vida Rigger, MD, Stopped at 08/23/23 2130   feeding supplement (PROSource TF20) liquid 60 mL, 60 mL, Per Tube, Daily, Erin Fulling, MD, 60 mL at 08/24/23 1008   folic acid (FOLVITE) tablet 1 mg, 1 mg, Per Tube, Daily, Rust-Chester, Micheline Rough L, NP, 1 mg at 08/24/23 1001   free water 30 mL, 30 mL, Per Tube, Q4H, Erin Fulling, MD, 30 mL at 08/24/23 0953   gabapentin (NEURONTIN) 250 MG/5ML solution 800 mg, 800 mg, Per Tube, Q8H, Jamerica Snavely, MD, 800 mg at 08/24/23 0620   glycopyrrolate (ROBINUL) injection 0.1 mg, 0.1 mg, Intravenous, TID, Karna Christmas, Gillis Boardley, MD, 0.1 mg at 08/24/23 1008   influenza vac split trivalent PF (FLULAVAL) injection 0.5 mL, 0.5 mL, Intramuscular, Tomorrow-1000, Kasa, Kurian, MD   insulin aspart (novoLOG) injection 0-9 Units, 0-9 Units, Subcutaneous, Q4H, Rust-Chester, Britton L, NP, 1 Units at 08/24/23 0842   ipratropium-albuterol (DUONEB) 0.5-2.5 (3) MG/3ML nebulizer solution 3 mL, 3 mL, Nebulization, Q6H, Kalil Woessner, MD, 3 mL at 08/24/23 0750   LORazepam (ATIVAN) injection 1 mg, 1 mg, Intravenous, Q4H PRN, Erin Fulling, MD, 1 mg at 08/24/23 1005   midodrine (PROAMATINE) tablet 5 mg, 5 mg, Per Tube, TID WC, Karna Christmas, Ayanni Tun, MD, 5 mg at 08/22/23 1635   nutrition supplement (JUVEN) (JUVEN) powder packet 1 packet, 1 packet, Per Tube, BID BM, Vida Rigger, MD, 1 packet at 08/24/23 1008   ondansetron (ZOFRAN) injection 4 mg, 4 mg, Intravenous, Q6H PRN, Rust-Chester, Micheline Rough L, NP, 4 mg at 08/23/23 2210   Oral care mouth rinse, 15 mL, Mouth Rinse, 4 times  per day, Vida Rigger, MD   Oral care mouth rinse, 15 mL, Mouth Rinse, PRN, Vida Rigger, MD   pantoprazole (PROTONIX) injection 40 mg, 40 mg, Intravenous, Q24H, Kasa, Kurian, MD, 40 mg at 08/23/23 1431   [COMPLETED] PHENObarbital (LUMINAL) injection 97.5 mg, 97.5 mg, Intravenous, Q8H, 97.5 mg at 08/21/23 0455 **FOLLOWED BY** [COMPLETED] PHENObarbital (LUMINAL) injection 65 mg, 65 mg, Intravenous, Q8H, 65 mg at 08/23/23 0433 **FOLLOWED BY** PHENObarbital (LUMINAL) injection 32.5 mg, 32.5 mg, Intravenous, Q8H, Barrie Folk, RPH, 32.5 mg at 08/24/23 1610   pneumococcal 20-valent conjugate vaccine (PREVNAR 20) injection 0.5 mL, 0.5 mL, Intramuscular, Tomorrow-1000, Kasa, Kurian, MD   polyethylene glycol (MIRALAX / GLYCOLAX) packet 17 g, 17 g, Per Tube, Daily PRN, Rust-Chester, Micheline Rough L, NP   polyethylene glycol (MIRALAX / GLYCOLAX) packet 17 g, 17 g, Per Tube, Daily, Belia Heman, Wallis Bamberg, MD, 17 g at 08/22/23 0915   QUEtiapine (SEROQUEL) tablet 100 mg, 100 mg, Per Tube, BID, Karna Christmas, Ron Junco, MD, 100 mg at 08/24/23 1001   Racepinephrine HCl 2.25 % nebulizer solution 0.5 mL, 0.5 mL, Nebulization, Q4H PRN, Harlon Ditty D, NP, 0.5 mL at 08/23/23 1010   sodium chloride flush (NS) 0.9 % injection 10-40 mL, 10-40 mL, Intracatheter, Q12H, Kasa, Kurian, MD, 10 mL at 08/24/23 1009   sodium chloride flush (NS) 0.9 % injection 10-40 mL, 10-40 mL, Intracatheter, PRN, Erin Fulling, MD   sulfamethoxazole-trimethoprim (BACTRIM) 200-40 MG/5ML suspension 20 mL, 20  mL, Per Tube, Q12H, Vida Rigger, MD, 20 mL at 08/24/23 1008   [COMPLETED] thiamine (VITAMIN B1) 500 mg in sodium chloride 0.9 % 50 mL IVPB, 500 mg, Intravenous, Q24H, Stopped at 08/21/23 2203 **FOLLOWED BY** thiamine (VITAMIN B1) tablet 100 mg, 100 mg, Per Tube, Daily, Tressie Ellis, RPH, 100 mg at 08/24/23 1001    ALLERGIES   Patient has no known allergies.    REVIEW OF SYSTEMS     On ventilator unable to obtain ROS  PHYSICAL  EXAMINATION   Vital Signs: Temp:  [97.8 F (36.6 C)-99.4 F (37.4 C)] 98.4 F (36.9 C) (01/30 0803) Pulse Rate:  [69-108] 72 (01/30 0913) Resp:  [16-38] 26 (01/30 0913) BP: (106-168)/(63-94) 146/87 (01/30 0913) SpO2:  [87 %-100 %] 96 % (01/30 0913) Weight:  [88.4 kg] 88.4 kg (01/30 0500)  GENERAL:NAD age appropriate HEAD: Normocephalic, atraumatic.  EYES: Pupils equal, round, reactive to light.  No scleral icterus.  MOUTH: Moist mucosal membrane. NECK: Supple. No thyromegaly. No nodules. No JVD.  ETT+ heavy phlegm PULMONARY: MV sounds CARDIOVASCULAR: S1 and S2. Regular rate and rhythm. No murmurs, rubs, or gallops.  GASTROINTESTINAL: Soft, nontender, non-distended. No masses. Positive bowel sounds. No hepatosplenomegaly. Stage 1 ulcer on sacrum MUSCULOSKELETAL: No swelling, clubbing, or edema.  NEUROLOGIC:GCS 4T SKIN:intact,warm,dry   PERTINENT DATA     Infusions:  dexmedetomidine (PRECEDEX) IV infusion 1.1 mcg/kg/hr (08/24/23 0953)   feeding supplement (OSMOLITE 1.5 CAL) Stopped (08/23/23 2130)   Scheduled Medications:  Chlorhexidine Gluconate Cloth  6 each Topical Daily   clonazepam  1 mg Per Tube BID   docusate  100 mg Per Tube BID   enoxaparin (LOVENOX) injection  40 mg Subcutaneous Q24H   famotidine  20 mg Per Tube BID   feeding supplement (PROSource TF20)  60 mL Per Tube Daily   folic acid  1 mg Per Tube Daily   free water  30 mL Per Tube Q4H   gabapentin  800 mg Per Tube Q8H   glycopyrrolate  0.1 mg Intravenous TID   influenza vac split trivalent PF  0.5 mL Intramuscular Tomorrow-1000   insulin aspart  0-9 Units Subcutaneous Q4H   ipratropium-albuterol  3 mL Nebulization Q6H   midodrine  5 mg Per Tube TID WC   nutrition supplement (JUVEN)  1 packet Per Tube BID BM   mouth rinse  15 mL Mouth Rinse 4 times per day   pantoprazole (PROTONIX) IV  40 mg Intravenous Q24H   PHENObarbital  32.5 mg Intravenous Q8H   pneumococcal 20-valent conjugate vaccine  0.5 mL  Intramuscular Tomorrow-1000   polyethylene glycol  17 g Per Tube Daily   QUEtiapine  100 mg Per Tube BID   sodium chloride flush  10-40 mL Intracatheter Q12H   sulfamethoxazole-trimethoprim  20 mL Per Tube Q12H   thiamine  100 mg Per Tube Daily   PRN Medications: acetaminophen, docusate, LORazepam, ondansetron (ZOFRAN) IV, mouth rinse, polyethylene glycol, Racepinephrine HCl, sodium chloride flush Hemodynamic parameters:   Intake/Output: 01/29 0701 - 01/30 0700 In: 1267.1 [I.V.:407.6; NG/GT:859.5] Out: 4775 [Urine:4300; Emesis/NG output:475]  Ventilator  Settings:     LAB RESULTS:  Basic Metabolic Panel: Recent Labs  Lab 08/21/23 0450 08/21/23 0614 08/21/23 1347 08/21/23 2120 08/22/23 0414 08/23/23 0435 08/23/23 2010 08/24/23 0427  NA 133* 141  --   --  142 143  --  141  K 2.8* 3.2*  --  3.3* 3.6 3.7 3.9 3.8  CL 105 112*  --   --  112* 105  --  107  CO2 22 23  --   --  25 28  --  28  GLUCOSE 537* 127*  --   --  156* 131*  --  126*  BUN 15 16  --   --  9 15  --  12  CREATININE 0.72 0.76  --   --  0.69 0.62  --  0.53  CALCIUM 6.9* 7.8*  --   --  8.0* 8.5*  --  8.6*  MG 1.7  --  1.7  --  2.2 2.0  --  1.8  PHOS 1.6*  --  15.1* 3.4 2.9 3.6  --  3.5   Liver Function Tests: Recent Labs  Lab 08/20/23 0323 08/21/23 0450 08/22/23 0414 08/23/23 0435 08/24/23 0427  ALBUMIN 2.9* 2.2* 2.4* 2.6* 2.8*   No results for input(s): "LIPASE", "AMYLASE" in the last 168 hours.  No results for input(s): "AMMONIA" in the last 168 hours. CBC: Recent Labs  Lab 08/20/23 0323 08/21/23 0450 08/22/23 0414 08/23/23 0435 08/24/23 0427  WBC 22.4* 9.4 6.9 6.6 9.3  HGB 12.3 9.6* 10.0* 11.2* 11.8*  HCT 37.6 29.5* 30.9* 35.5* 35.4*  MCV 92.4 93.1 91.7 94.7 89.6  PLT 233 174 175 185 192   Cardiac Enzymes: No results for input(s): "CKTOTAL", "CKMB", "CKMBINDEX", "TROPONINI" in the last 168 hours. BNP: Invalid input(s): "POCBNP" CBG: Recent Labs  Lab 08/23/23 2000 08/23/23 2338  08/24/23 0407 08/24/23 0432 08/24/23 0751  GLUCAP 150* 136* 122* 112* 121*       IMAGING RESULTS:     ASSESSMENT AND PLAN    -Multidisciplinary rounds held today  #Acute Hypoxic Respiratory Failure secondary to suspected drug overdose of undetermined intent & suspected Aspiration #Post extubation Stridor ~ TREATED  PMHx: COPD, Asthma, OSA -Prn Bronchodilators -ABX -zosyn >> augmentin- staph aureus -noted thick secretions per ETT recultured +glycopyrollate    #Hypotension: Septic shock vs Sedation related Echocardiogram: 08/17/23:  LVEF 60-65%, normal diastolic parameters, RV systolic function normal -Continuous cardiac monitoring -Maintain MAP >65 -IV fluids -Vasopressors as needed to maintain MAP goal - Midodrine 10 mg TID>>reduced to 5 tid  -Lactic acid is normalized -HS Troponin negative x2 -hold outpatient losartan  -IVF - LR 1L bolus   #Multifocal Pneumonia, questionable aspiration -Monitor fever curve -Trend WBC's & Procalcitonin -Follow cultures as above    #Acute Kidney Injury #Hypokalemia ~ RESOLVED #Mild NAGMA ~ RESOLVED -Monitor I&O's / urinary output -Follow BMP -Ensure adequate renal perfusion -Avoid nephrotoxic agents as able -Replace electrolytes as indicated ~ Pharmacy following for assistance with electrolyte replacement -IV fluids -Consider Renal Ultrasound   #Type 2 Diabetes Mellitus Hemoglobin A1C: 6.3 (05/2023) -CBG's q4h; Target range of 140 to 180 -SSI -Follow ICU Hypo/Hyperglycemia protocol   #Acute Encephalopathy suspect secondary to drug overdose of undetermined intent in the setting of known history of Cocaine Abuse #ETOH use with Delirium T[place]ens #Sedation needs in setting of mechanical ventilation #Anxiety & Depression #Bipolar Disorder UDS + cocaine, marijuana & amphetamines. Blood Alcohol level elevated at 190 -Maintain a RASS goal of 0 to -1 -Fentanyl and Propofol as needed to maintain RASS goal -Avoid sedating  medications as able -Daily wake up assessment -CIWA protocol -Continue Phenobarb taper -High dose thiamine x3 days followed by 100 mg daily -Folic acid and MVI -Hold outpatient regimen: Seroquel, effexor xr, vraylar. Consider restarting as patient stabilizes -once stabilized will need to screen for suicidal ideation         Best Practice (right click  and "Reselect all SmartList Selections" daily)  Diet/type: NPO w/ meds via tube DVT prophylaxis LMWH Pressure ulcer(s): N/A GI prophylaxis: H2B Lines: N/A Foley:  Yes, and it is still needed Code Status:  full code Last date of multidisciplinary goals of care discussion [1/26]    ID -continue IV abx as prescibed -follow up cultures  GI/Nutrition GI PROPHYLAXIS as indicated DIET-->TF's as tolerated Constipation protocol as indicated  ENDO - ICU hypoglycemic\Hyperglycemia protocol -check FSBS per protocol   ELECTROLYTES -follow labs as needed -replace as needed -pharmacy consultation   DVT/GI PRX ordered -SCDs  TRANSFUSIONS AS NEEDED MONITOR FSBS ASSESS the need for LABS as needed    Critical care provider statement:   Total critical care time: 33 minutes   Performed by: Karna Christmas MD   Critical care time was exclusive of separately billable procedures and treating other patients.   Critical care was necessary to treat or prevent imminent or life-threatening deterioration.   Critical care was time spent personally by me on the following activities: development of treatment plan with patient and/or surrogate as well as nursing, discussions with consultants, evaluation of patient's response to treatment, examination of patient, obtaining history from patient or surrogate, ordering and performing treatments and interventions, ordering and review of laboratory studies, ordering and review of radiographic studies, pulse oximetry and re-evaluation of patient's condition.    Vida Rigger, M.D.  Pulmonary &  Critical Care Medicine

## 2023-08-24 NOTE — Plan of Care (Signed)
Pt fluctuates between -2 to -3 RASS to being agitated. Pt began yelling and attempting to hit staff after receiving a bath. Unable to re-direct pt. MD aware and new medications ordered to help pt participate in care. VSS on 12 L HFNC. OG to low intermittent suction.

## 2023-08-25 ENCOUNTER — Inpatient Hospital Stay: Payer: Medicaid Other

## 2023-08-25 ENCOUNTER — Other Ambulatory Visit: Payer: Self-pay

## 2023-08-25 DIAGNOSIS — J9600 Acute respiratory failure, unspecified whether with hypoxia or hypercapnia: Secondary | ICD-10-CM

## 2023-08-25 DIAGNOSIS — L8990 Pressure ulcer of unspecified site, unspecified stage: Secondary | ICD-10-CM | POA: Insufficient documentation

## 2023-08-25 DIAGNOSIS — Y95 Nosocomial condition: Secondary | ICD-10-CM | POA: Insufficient documentation

## 2023-08-25 LAB — GLUCOSE, CAPILLARY
Glucose-Capillary: 117 mg/dL — ABNORMAL HIGH (ref 70–99)
Glucose-Capillary: 113 mg/dL — ABNORMAL HIGH (ref 70–99)
Glucose-Capillary: 152 mg/dL — ABNORMAL HIGH (ref 70–99)
Glucose-Capillary: 133 mg/dL — ABNORMAL HIGH (ref 70–99)
Glucose-Capillary: 116 mg/dL — ABNORMAL HIGH (ref 70–99)
Glucose-Capillary: 120 mg/dL — ABNORMAL HIGH (ref 70–99)
Glucose-Capillary: 115 mg/dL — ABNORMAL HIGH (ref 70–99)

## 2023-08-25 LAB — BLOOD GAS, ARTERIAL
Acid-Base Excess: 5.8 mmol/L — ABNORMAL HIGH (ref 0.0–2.0)
Bicarbonate: 31.2 mmol/L — ABNORMAL HIGH (ref 20.0–28.0)
FIO2: 0.4 %
MECHVT: 470 mL
O2 Saturation: 95.9 %
PEEP: 5 cmH2O
Patient temperature: 37
RATE: 18 {breaths}/min
pCO2 arterial: 47 mm[Hg] (ref 32–48)
pH, Arterial: 7.43 (ref 7.35–7.45)
pO2, Arterial: 73 mm[Hg] — ABNORMAL LOW (ref 83–108)

## 2023-08-25 LAB — CBC
HCT: 37.7 % (ref 36.0–46.0)
Hemoglobin: 12.1 g/dL (ref 12.0–15.0)
MCH: 29.4 pg (ref 26.0–34.0)
MCHC: 32.1 g/dL (ref 30.0–36.0)
MCV: 91.5 fL (ref 80.0–100.0)
Platelets: 219 10*3/uL (ref 150–400)
RBC: 4.12 MIL/uL (ref 3.87–5.11)
RDW: 11.9 % (ref 11.5–15.5)
WBC: 11.7 10*3/uL — ABNORMAL HIGH (ref 4.0–10.5)
nRBC: 0 % (ref 0.0–0.2)

## 2023-08-25 LAB — MAGNESIUM: Magnesium: 1.8 mg/dL (ref 1.7–2.4)

## 2023-08-25 LAB — RENAL FUNCTION PANEL
Albumin: 2.7 g/dL — ABNORMAL LOW (ref 3.5–5.0)
Anion gap: 10 (ref 5–15)
BUN: 16 mg/dL (ref 6–20)
CO2: 29 mmol/L (ref 22–32)
Calcium: 8.7 mg/dL — ABNORMAL LOW (ref 8.9–10.3)
Chloride: 102 mmol/L (ref 98–111)
Creatinine, Ser: 0.51 mg/dL (ref 0.44–1.00)
GFR, Estimated: >60 mL/min (ref >60)
Glucose, Bld: 118 mg/dL — ABNORMAL HIGH (ref 70–99)
Phosphorus: 3.3 mg/dL (ref 2.5–4.6)
Potassium: 3.9 mmol/L (ref 3.5–5.1)
Sodium: 141 mmol/L (ref 135–145)

## 2023-08-25 MED ORDER — PROPOFOL 1000 MG/100ML IV EMUL
0.0000 ug/kg/min | INTRAVENOUS | Status: DC
  Administered 2023-08-25: 5 ug/kg/min via INTRAVENOUS
  Administered 2023-08-26: 35 ug/kg/min via INTRAVENOUS
  Administered 2023-08-26 – 2023-08-27 (×6): 30 ug/kg/min via INTRAVENOUS
  Filled 2023-08-25 (×8): qty 100

## 2023-08-25 MED ORDER — FENTANYL BOLUS VIA INFUSION
50.0000 ug | INTRAVENOUS | Status: DC | PRN
  Administered 2023-08-25 – 2023-08-26 (×2): 100 ug via INTRAVENOUS
  Administered 2023-08-27: 50 ug via INTRAVENOUS
  Administered 2023-08-27: 100 ug via INTRAVENOUS
  Administered 2023-08-27: 50 ug via INTRAVENOUS
  Administered 2023-08-27: 100 ug via INTRAVENOUS
  Administered 2023-08-27: 50 ug via INTRAVENOUS
  Administered 2023-08-27: 100 ug via INTRAVENOUS
  Administered 2023-08-28: 50 ug via INTRAVENOUS
  Administered 2023-08-28: 100 ug via INTRAVENOUS
  Administered 2023-08-28 (×2): 50 ug via INTRAVENOUS
  Administered 2023-08-28: 100 ug via INTRAVENOUS
  Administered 2023-08-28 (×2): 50 ug via INTRAVENOUS

## 2023-08-25 MED ORDER — GLYCOPYRROLATE 0.2 MG/ML IJ SOLN: 0.1000 mg | Freq: Three times a day (TID) | INTRAMUSCULAR | Status: DC | PRN

## 2023-08-25 MED ORDER — MIDAZOLAM BOLUS VIA INFUSION: 0.0000 mg | INTRAVENOUS | Status: DC | PRN

## 2023-08-25 MED ORDER — ETOMIDATE 2 MG/ML IV SOLN
20.0000 mg | Freq: Once | INTRAVENOUS | Status: AC
  Administered 2023-08-25: 20 mg via INTRAVENOUS
  Filled 2023-08-25: qty 10

## 2023-08-25 MED ORDER — ROCURONIUM BROMIDE 10 MG/ML (PF) SYRINGE
50.0000 mg | PREFILLED_SYRINGE | Freq: Once | INTRAVENOUS | Status: AC
  Administered 2023-08-25: 50 mg via INTRAVENOUS

## 2023-08-25 MED ORDER — DOCUSATE SODIUM 50 MG/5ML PO LIQD
100.0000 mg | Freq: Two times a day (BID) | ORAL | Status: DC
  Administered 2023-08-25 – 2023-08-27 (×3): 100 mg
  Filled 2023-08-25 (×3): qty 10

## 2023-08-25 MED ORDER — MIDAZOLAM-SODIUM CHLORIDE 100-0.9 MG/100ML-% IV SOLN: 0.0000 mg/h | INTRAVENOUS | Status: DC

## 2023-08-25 MED ORDER — ROCURONIUM BROMIDE 10 MG/ML (PF) SYRINGE
PREFILLED_SYRINGE | INTRAVENOUS | Status: AC
  Filled 2023-08-25: qty 10

## 2023-08-25 MED ORDER — FENTANYL 2500MCG IN NS 250ML (10MCG/ML) PREMIX INFUSION
50.0000 ug/h | INTRAVENOUS | Status: DC
  Administered 2023-08-25: 50 ug/h via INTRAVENOUS
  Administered 2023-08-26 – 2023-08-27 (×2): 125 ug/h via INTRAVENOUS
  Administered 2023-08-27 – 2023-08-28 (×2): 200 ug/h via INTRAVENOUS
  Filled 2023-08-25 (×5): qty 250

## 2023-08-25 MED ORDER — VALPROIC ACID 250 MG/5ML PO SOLN
250.0000 mg | Freq: Three times a day (TID) | ORAL | Status: DC
  Administered 2023-08-25 – 2023-08-27 (×6): 250 mg
  Filled 2023-08-25 (×7): qty 5

## 2023-08-25 MED ORDER — POLYETHYLENE GLYCOL 3350 17 G PO PACK: 17.0000 g | PACK | Freq: Every day | ORAL | Status: DC

## 2023-08-25 MED ORDER — FENTANYL CITRATE (PF) 100 MCG/2ML IJ SOLN
100.0000 ug | Freq: Once | INTRAMUSCULAR | Status: AC
  Administered 2023-08-25: 100 ug via INTRAVENOUS
  Filled 2023-08-25: qty 2

## 2023-08-25 MED ORDER — NOREPINEPHRINE 4 MG/250ML-% IV SOLN
INTRAVENOUS | Status: AC
  Filled 2023-08-25: qty 250

## 2023-08-25 MED ORDER — NOREPINEPHRINE 4 MG/250ML-% IV SOLN
0.0000 ug/min | INTRAVENOUS | Status: DC
  Administered 2023-08-25: 2 ug/min via INTRAVENOUS
  Administered 2023-08-26: 3 ug/min via INTRAVENOUS
  Administered 2023-08-28: 4 ug/min via INTRAVENOUS
  Administered 2023-08-28: 3 ug/min via INTRAVENOUS
  Administered 2023-08-29: 5 ug/min via INTRAVENOUS
  Administered 2023-09-01 – 2023-09-04 (×2): 2 ug/min via INTRAVENOUS
  Filled 2023-08-25 (×4): qty 250

## 2023-08-25 NOTE — Progress Notes (Signed)
CRITICAL CARE PROGRESS NOTE    Name: Andrea Sanford MRN: [id number] DOB: 05/07/1971     LOS: 8   SUBJECTIVE FINDINGS & SIGNIFICANT EVENTS    History of Presenting Illness:  -patient with polysubstance abuse including drugs and alcohol.  She was found unresponsive.  She has PMH of psychiatric conditions, lifelong smoking, homelesness, COPD, anxiety and depression.    ED course: Upon arrival patient obtunded, tachypneic, hypothermic and hypertensive being supported with a BVM. Patient emergently intubated for airway protection in the setting of suspected drug overdose, becoming hypotensive after induction requiring vasopressor support. Labs significant for hypokalemia and mild NAGMA.   Imaging revealed vascular congestion on CXR with no acute abnormality on CTH.  Medications given: etomidate & succinylcholine, 3 L bolus, levophed drip started Initial Vitals: 95.1, 22, 95, 168/138, 90% BVM Significant labs: (Labs/ Imaging personally reviewed) I, [name], AGACNP-BC, personally viewed and interpreted this ECG. EKG Interpretation: Date: 08/17/23, EKG Time: 04:45, Rate: 77, Rhythm: NSR, QRS Axis:  normal, Intervals: borderline prolonged Qtc, ST/T Wave abnormalities: very mild STE in leads I, II and avL (does not meet STEMI criteria), Narrative Interpretation: NSR Chemistry: Na+:138, K+: 3.1, BUN/Cr.: 11/0.84, Serum CO2/ AG: 20/ 11 Hematology: WBC: 9.8, Hgb: 13.6,  Troponin: 6, BNP: pending, Lactic/ PCT: 0.9/pending,  COVID-19 & Influenza A/B: pending   Alcohol level: 190, Acetaminophen: <10, Salicylate: <7 UDS: +amphetamines, marijuana, cocaine   ABG: 7.27/49/173/22.5 CXR 08/17/23:  Low volume film with vascular congestion. Large gastric bubble despite the presence of an NG tube. CT head wo contrast  08/17/23: No acute intracranial abnormality identified. Mild but progressed cerebral white matter changes since 2019, most commonly due to small vessel disease. Chronic left facial fractures.   08/21/23- patient with heavy dark inspissated ETT secretions with bronchospasm. RT worried about increased resistance on MV due to thick resp mucus. We plan to perform bronchoscopy I reviewed medical findings with [place] mother of patient .  08/22/23- patient failed SBT today, failed with AMS and hypoxemia 08/25/23-patient is awake but aggitated and requiring sitter due to concerns for fall out of bed.  High risk for withdrawal from drug/alcohol abuse.   Trach aspirate with staph aureus.  She had vomit overnight.  Patient has increased O2 requirement and has been advanced to Heated [facility]NC.   Lines/tubes : Airway 8 mm (Active)  Secured at (cm) 22 cm 08/21/23 0826  Measured From Lips 08/21/23 0826  Secured Location Left 08/21/23 0826  Secured By [employer] 08/21/23 0826  Bite Block No 08/21/23 0826  Tube Holder Repositioned Yes 08/21/23 0826  Prone position No 08/21/23 0826  Cuff Pressure (cm H2O) Green OR 18-26 [facility] 08/21/23 0826  Site Condition Dry 08/21/23 0826     PICC Triple Lumen 08/20/23 Right Basilic 39 cm 0 cm (Active)  Indication for Insertion or Continuance of Line Vasoactive infusions 08/21/23 0733  Exposed Catheter (cm) 0 cm 08/21/23 0733  Site Assessment Clean, Dry, Intact 08/21/23 0733  Lumen #1 Status In-line blood sampling system in place 08/21/23 0733  Lumen #2 Status Flushed;Infusing;Blood return noted 08/21/23 0733  Lumen #3 Status Infusing;Flushed;Blood return noted 08/21/23 4782  Dressing Type Transparent;Securing device 08/21/23 9562  Dressing Status Antimicrobial disc/dressing in place 08/21/23 1308  Line Care Connections checked and tightened 08/21/23 6578  Line Adjustment (NICU/IV Team Only) No 08/20/23 1029  Dressing Intervention New dressing;Adhesive  placed at insertion site (IV team only);Adhesive placed around edges of dressing (IV team/ICU RN only) 08/20/23 1029  Dressing Change Due 08/27/23 08/21/23 0733     NG/OG Vented/Dual Lumen 14 Fr. Right nare Marking at nare/corner of mouth 70 cm (Active)  Tube Position (Required) Marking at nare/corner of mouth 08/21/23 0736  Measurement (cm) (Required) 70 cm 08/21/23 0736  Ongoing Placement Verification (Required) (See row information) Yes 08/21/23 0736  Site Assessment Clean, Dry, Intact 08/21/23 0736  Interventions Clamped 08/21/23 0736  Status Clamped 08/21/23 0736  Intake (mL) 30 mL 08/21/23 0736  Output (mL) 0 mL 08/21/23 0600     Urethral Catheter C. Vazquez Non-latex 14 Fr. (Active)  Indication for Insertion or Continuance of Catheter Acute urinary retention (I&O Cath for 24 hrs prior to catheter insertion- Inpatient Only) 08/21/23 0736  Site Assessment Clean, Dry, Intact 08/21/23 0736  Catheter Maintenance Bag below level of bladder;Catheter secured;Drainage bag/tubing not touching floor;Insertion date on drainage bag;No dependent loops;Seal intact;Bag emptied prior to transport 08/21/23 0736  Collection Container Standard drainage bag 08/21/23 0736  Securement Method Adhesive securement device 08/21/23 0736  Output (mL) 70 mL 08/21/23 0600    Microbiology/Sepsis markers: Results for orders placed or performed during the hospital encounter of 08/17/23  Culture, blood (routine x 2)     Status: None   Collection Time: 08/17/23  4:52 AM   Specimen: BLOOD  Result Value Ref Range Status   Specimen Description BLOOD LEFT ANTECUBITAL  Final   Special Requests   Final    BOTTLES DRAWN AEROBIC AND ANAEROBIC Blood Culture results may not be optimal due to an excessive volume of blood received in culture bottles   Culture   Final    NO GROWTH 5 DAYS Performed at [facility], [address] [place], [place] [address]    Report Status 08/22/2023 FINAL  Final  Culture, blood  (routine x 2)     Status: None   Collection Time: 08/17/23  4:52 AM   Specimen: BLOOD  Result Value Ref Range Status   Specimen Description BLOOD BLOOD LEFT HAND  Final   Special Requests   Final    BOTTLES DRAWN AEROBIC AND ANAEROBIC Blood Culture adequate volume   Culture   Final    NO GROWTH 5 DAYS Performed at [facility], [address] [place], [place] [address]    Report Status 08/22/2023 FINAL  Final  Resp panel by RT-PCR (RSV, Flu A&B, Covid) Anterior Nasal Swab     Status: None   Collection Time: 08/17/23  5:11 AM   Specimen: Anterior Nasal Swab  Result Value Ref Range Status   SARS Coronavirus 2 by RT PCR NEGATIVE NEGATIVE Final    Comment: (NOTE) SARS-CoV-2 target nucleic acids are NOT DETECTED.  The SARS-CoV-2 RNA is generally detectable in upper respiratory specimens during the acute phase of infection. The lowest concentration of SARS-CoV-2 viral copies this assay can detect is 138 copies/mL. A negative result does not preclude SARS-Cov-2 infection and should not be used as the sole basis for treatment or other patient management decisions. A negative result may occur with  improper specimen collection/handling, submission of specimen other than nasopharyngeal swab, presence of viral mutation(s) within the areas targeted by this assay, and inadequate number of viral copies(<138 copies/mL). A negative result must be combined with clinical observations, patient history, and epidemiological information. The expected result is Negative.  Fact Sheet for Patients:  BloggerCourse.com  Fact Sheet for Healthcare Providers:  SeriousBroker.it  This test is no t yet approved or cleared by the [place] FDA and  has  been authorized for detection and/or diagnosis of SARS-CoV-2 by FDA under an Emergency Use Authorization (EUA). This EUA will remain  in effect (meaning this test can be used) for the duration of  the COVID-19 declaration under Section 564(b)(1) of the Act, 21 U.S.C.section 360bbb-3(b)(1), unless the authorization is terminated  or revoked sooner.       Influenza A by PCR NEGATIVE NEGATIVE Final   Influenza B by PCR NEGATIVE NEGATIVE Final    Comment: (NOTE) The Xpert Xpress SARS-CoV-2/FLU/RSV plus assay is intended as an aid in the diagnosis of influenza from Nasopharyngeal swab specimens and should not be used as a sole basis for treatment. Nasal washings and aspirates are unacceptable for Xpert Xpress SARS-CoV-2/FLU/RSV testing.  Fact Sheet for Patients: BloggerCourse.com  Fact Sheet for Healthcare Providers: SeriousBroker.it  This test is not yet approved or cleared by the [place] FDA and has been authorized for detection and/or diagnosis of SARS-CoV-2 by FDA under an Emergency Use Authorization (EUA). This EUA will remain in effect (meaning this test can be used) for the duration of the COVID-19 declaration under Section 564(b)(1) of the Act, 21 U.S.C. section 360bbb-3(b)(1), unless the authorization is terminated or revoked.     Resp Syncytial Virus by PCR NEGATIVE NEGATIVE Final    Comment: (NOTE) Fact Sheet for Patients: BloggerCourse.com  Fact Sheet for Healthcare Providers: SeriousBroker.it  This test is not yet approved or cleared by the [place] FDA and has been authorized for detection and/or diagnosis of SARS-CoV-2 by FDA under an Emergency Use Authorization (EUA). This EUA will remain in effect (meaning this test can be used) for the duration of the COVID-19 declaration under Section 564(b)(1) of the Act, 21 U.S.C. section 360bbb-3(b)(1), unless the authorization is terminated or revoked.  Performed at [facility], [address] [place], [place] [address]   MRSA Next Gen by PCR, Nasal     Status: None   Collection Time:  08/17/23  6:00 AM   Specimen: Nasal Mucosa; Nasal Swab  Result Value Ref Range Status   MRSA by PCR Next Gen NOT DETECTED NOT DETECTED Final    Comment: (NOTE) The GeneXpert MRSA Assay (FDA approved for NASAL specimens only), is one component of a comprehensive MRSA colonization surveillance program. It is not intended to diagnose MRSA infection nor to guide or monitor treatment for MRSA infections. Test performance is not FDA approved in patients less than 65 years old. Performed at [facility], [address] [place], [place] [address]   Culture, blood (Routine X 2) w Reflex to ID Panel     Status: None   Collection Time: 08/19/23  4:23 PM   Specimen: BLOOD  Result Value Ref Range Status   Specimen Description BLOOD BLOOD RIGHT ARM AEROBIC BOTTLE ONLY  Final   Special Requests   Final    BOTTLES DRAWN AEROBIC ONLY Blood Culture results may not be optimal due to an inadequate volume of blood received in culture bottles   Culture   Final    NO GROWTH 5 DAYS Performed at [facility], [address], [place], [place] [address]    Report Status 08/24/2023 FINAL  Final  Culture, blood (Routine X 2) w Reflex to ID Panel     Status: None   Collection Time: 08/19/23  4:23 PM   Specimen: BLOOD  Result Value Ref Range Status   Specimen Description BLOOD BLOOD RIGHT HAND AEROBIC BOTTLE ONLY  Final   Special Requests   Final  BOTTLES DRAWN AEROBIC ONLY Blood Culture results may not be optimal due to an inadequate volume of blood received in culture bottles   Culture   Final    NO GROWTH 5 DAYS Performed at [facility], [address] [place], [place] [address]    Report Status 08/24/2023 FINAL  Final  Respiratory (~20 pathogens) panel by PCR     Status: None   Collection Time: 08/20/23 11:04 AM   Specimen: Nasopharyngeal Swab; Respiratory  Result Value Ref Range Status   Adenovirus NOT DETECTED NOT DETECTED Final   Coronavirus 229E NOT DETECTED NOT  DETECTED Final    Comment: (NOTE) The Coronavirus on the Respiratory Panel, DOES NOT test for the novel  Coronavirus (2019 nCoV)    Coronavirus HKU1 NOT DETECTED NOT DETECTED Final   Coronavirus NL63 NOT DETECTED NOT DETECTED Final   Coronavirus OC43 NOT DETECTED NOT DETECTED Final   Metapneumovirus NOT DETECTED NOT DETECTED Final   Rhinovirus / Enterovirus NOT DETECTED NOT DETECTED Final   Influenza A NOT DETECTED NOT DETECTED Final   Influenza B NOT DETECTED NOT DETECTED Final   Parainfluenza Virus 1 NOT DETECTED NOT DETECTED Final   Parainfluenza Virus 2 NOT DETECTED NOT DETECTED Final   Parainfluenza Virus 3 NOT DETECTED NOT DETECTED Final   Parainfluenza Virus 4 NOT DETECTED NOT DETECTED Final   Respiratory Syncytial Virus NOT DETECTED NOT DETECTED Final   Bordetella pertussis NOT DETECTED NOT DETECTED Final   Bordetella Parapertussis NOT DETECTED NOT DETECTED Final   Chlamydophila pneumoniae NOT DETECTED NOT DETECTED Final   Mycoplasma pneumoniae NOT DETECTED NOT DETECTED Final    Comment: Performed at [facility], [address], [place], [place] [address]  Culture, Respiratory w Gram Stain     Status: None   Collection Time: 08/20/23  1:56 PM   Specimen: Tracheal Aspirate; Respiratory  Result Value Ref Range Status   Specimen Description   Final    TRACHEAL ASPIRATE Performed at [facility], [address] [place], [place] [address]    Special Requests   Final    NONE Performed at [facility], [address] [place], [place] [address]    Gram Stain   Final    FEW WBC PRESENT, PREDOMINANTLY PMN RARE GRAM POSITIVE COCCI Performed at [facility], [address], [place], [place] [address]    Culture FEW STAPHYLOCOCCUS AUREUS  Final   Report Status 08/24/2023 FINAL  Final   Organism ID, Bacteria STAPHYLOCOCCUS AUREUS  Final      Susceptibility   Staphylococcus aureus - MIC*    CIPROFLOXACIN <=0.5 SENSITIVE Sensitive      ERYTHROMYCIN >=8 RESISTANT Resistant     GENTAMICIN <=0.5 SENSITIVE Sensitive     OXACILLIN RESISTANT Resistant     TETRACYCLINE <=1 SENSITIVE Sensitive     VANCOMYCIN <=0.5 SENSITIVE Sensitive     TRIMETH/SULFA <=10 SENSITIVE Sensitive     CLINDAMYCIN <=0.25 SENSITIVE Sensitive     RIFAMPIN <=0.5 SENSITIVE Sensitive     Inducible Clindamycin NEGATIVE Sensitive     LINEZOLID 2 SENSITIVE Sensitive     * FEW STAPHYLOCOCCUS AUREUS  Culture, BAL-quantitative w Gram Stain     Status: Abnormal   Collection Time: 08/21/23  1:05 PM   Specimen: Bronchoalveolar Lavage; Respiratory  Result Value Ref Range Status   Specimen Description   Final    BRONCHIAL ALVEOLAR LAVAGE Performed at [facility], [address] [place], [place] [address]  Special Requests   Final    NONE Performed at [facility], [address] [place], [place] [address]    Gram Stain   Final    ABUNDANT WBC PRESENT, PREDOMINANTLY PMN NO ORGANISMS SEEN Performed at [facility], [address], [place], [place] [address]    Culture 10,000 COLONIES/mL STAPHYLOCOCCUS AUREUS (A)  Final   Report Status 08/24/2023 FINAL  Final   Organism ID, Bacteria STAPHYLOCOCCUS AUREUS (A)  Final      Susceptibility   Staphylococcus aureus - MIC*    CIPROFLOXACIN <=0.5 SENSITIVE Sensitive     ERYTHROMYCIN >=8 RESISTANT Resistant     GENTAMICIN <=0.5 SENSITIVE Sensitive     OXACILLIN 0.5 SENSITIVE Sensitive     TETRACYCLINE <=1 SENSITIVE Sensitive     VANCOMYCIN 1 SENSITIVE Sensitive     TRIMETH/SULFA <=10 SENSITIVE Sensitive     CLINDAMYCIN <=0.25 SENSITIVE Sensitive     RIFAMPIN <=0.5 SENSITIVE Sensitive     Inducible Clindamycin NEGATIVE Sensitive     LINEZOLID 2 SENSITIVE Sensitive     * 10,000 COLONIES/mL STAPHYLOCOCCUS AUREUS    Anti-infectives:  Anti-infectives (From admission, onward)    Start     Dose/Rate Route Frequency Ordered Stop   08/23/23 1800  sulfamethoxazole-trimethoprim  (BACTRIM) 200-40 MG/5ML suspension 20 mL        20 mL Per Tube Every 12 hours 08/23/23 1507     08/21/23 1800  amoxicillin-clavulanate (AUGMENTIN) 400-57 MG/5ML suspension 875 mg  Status:  Discontinued        875 mg Per Tube Every 12 hours 08/21/23 1148 08/23/23 1457   08/19/23 1730  piperacillin-tazobactam (ZOSYN) IVPB 3.375 g  Status:  Discontinued        3.375 g 12.5 mL/hr over 240 Minutes Intravenous Every 8 hours 08/19/23 1633 08/21/23 1146         PAST MEDICAL HISTORY   Past Medical History:  Diagnosis Date   Allergy    Anxiety    Arthritis    Asthma    Bipolar 1 disorder (HCC)    Cancer (HCC)    COPD (chronic obstructive pulmonary disease) (HCC)    Depression    Diabetes mellitus without complication (HCC)    Hypertension    Neuromuscular disorder (HCC)    Osteoporosis    Sleep apnea      SURGICAL HISTORY   Past Surgical History:  Procedure Laterality Date   ANKLE SURGERY Right    PERIPHERAL VASCULAR THROMBECTOMY Left 1991     FAMILY HISTORY   Family History  Problem Relation Age of Onset   Depression Mother    Diabetes Mother    Cancer Father      SOCIAL HISTORY   Social History   Tobacco Use   Smoking status: Every Day    Current packs/day: 1.00    Types: Cigarettes   Smokeless tobacco: Never  Substance Use Topics   Alcohol use: Not Currently    Alcohol/week: 2.0 standard drinks of alcohol    Types: 2 Standard drinks or equivalent per week   Drug use: Yes    Types: Cocaine    Comment: uses on occasions     MEDICATIONS   Current Medication:  Current Facility-Administered Medications:    acetaminophen (TYLENOL) tablet 650 mg, 650 mg, Per Tube, Q4H PRN, Rust-Chester, Britton L, NP, 650 mg at 08/21/23 1337   Chlorhexidine Gluconate Cloth 2 % PADS 6 each, 6 each, Topical, Daily, Erin Fulling, MD, 6 each at 08/25/23 [phone]  clonazepam (KLONOPIN) disintegrating tablet 1 mg, 1 mg, Per Tube, BID, Karna Christmas, Labrina Lines, MD, 1 mg at 08/25/23 0901    dexmedetomidine (PRECEDEX) 400 MCG/100ML (4 mcg/mL) infusion, 0-1.2 mcg/kg/hr, Intravenous, Continuous, [name], RPH, Last Rate: 17.68 mL/hr at 08/25/23 1058, 0.8 mcg/kg/hr at 08/25/23 1058   docusate (COLACE) 50 MG/5ML liquid 100 mg, 100 mg, Per Tube, BID PRN, Rust-Chester, Cecelia Byars, NP   docusate (COLACE) 50 MG/5ML liquid 100 mg, 100 mg, Per Tube, BID, Erin Fulling, MD, 100 mg at 08/22/23 2123   enoxaparin (LOVENOX) injection 40 mg, 40 mg, Subcutaneous, Q24H, Tressie Ellis, RPH, 40 mg at 08/25/23 0935   feeding supplement (OSMOLITE 1.5 CAL) liquid 1,000 mL, 1,000 mL, Per Tube, Continuous, Vida Rigger, MD, Stopped at 08/23/23 2130   feeding supplement (PROSource TF20) liquid 60 mL, 60 mL, Per Tube, Daily, Erin Fulling, MD, 60 mL at 08/25/23 1107   folic acid (FOLVITE) tablet 1 mg, 1 mg, Per Tube, Daily, Rust-Chester, Micheline Rough L, NP, 1 mg at 08/25/23 0900   free water 30 mL, 30 mL, Per Tube, Q4H, Kasa, Wallis Bamberg, MD, 30 mL at 08/25/23 0944   gabapentin (NEURONTIN) 250 MG/5ML solution 800 mg, 800 mg, Per Tube, Q8H, Xavier Fournier, MD, 800 mg at 08/25/23 0528   glycopyrrolate (ROBINUL) injection 0.1 mg, 0.1 mg, Intravenous, TID, Vida Rigger, MD, 0.1 mg at 08/24/23 2109   influenza vac split trivalent PF (FLULAVAL) injection 0.5 mL, 0.5 mL, Intramuscular, Tomorrow-1000, Kasa, Kurian, MD   insulin aspart (novoLOG) injection 0-9 Units, 0-9 Units, Subcutaneous, Q4H, Rust-Chester, Britton L, NP, 2 Units at 08/24/23 1557   ipratropium-albuterol (DUONEB) 0.5-2.5 (3) MG/3ML nebulizer solution 3 mL, 3 mL, Nebulization, Q6H, Aroush Chasse, MD, 3 mL at 08/25/23 0757   LORazepam (ATIVAN) injection 1 mg, 1 mg, Intravenous, Q4H PRN, Erin Fulling, MD, 1 mg at 08/25/23 0431   nutrition supplement (JUVEN) (JUVEN) powder packet 1 packet, 1 packet, Per Tube, BID BM, Vida Rigger, MD, 1 packet at 08/25/23 1108   ondansetron (ZOFRAN) injection 4 mg, 4 mg, Intravenous, Q6H PRN, Rust-Chester, Micheline Rough L,  NP, 4 mg at 08/23/23 2210   Oral care mouth rinse, 15 mL, Mouth Rinse, 4 times per day, Vida Rigger, MD, 15 mL at 08/25/23 0859   Oral care mouth rinse, 15 mL, Mouth Rinse, PRN, Vida Rigger, MD   pantoprazole (PROTONIX) injection 40 mg, 40 mg, Intravenous, Q24H, Kasa, Kurian, MD, 40 mg at 08/24/23 1555   pneumococcal 20-valent conjugate vaccine (PREVNAR 20) injection 0.5 mL, 0.5 mL, Intramuscular, Tomorrow-1000, Kasa, Kurian, MD   polyethylene glycol (MIRALAX / GLYCOLAX) packet 17 g, 17 g, Per Tube, Daily PRN, Rust-Chester, Micheline Rough L, NP   polyethylene glycol (MIRALAX / GLYCOLAX) packet 17 g, 17 g, Per Tube, Daily, Belia Heman, Wallis Bamberg, MD, 17 g at 08/25/23 1108   QUEtiapine (SEROQUEL) tablet 100 mg, 100 mg, Per Tube, BID, Karna Christmas, Thanos Cousineau, MD, 100 mg at 08/25/23 0903   Racepinephrine HCl 2.25 % nebulizer solution 0.5 mL, 0.5 mL, Nebulization, Q4H PRN, Harlon Ditty D, NP, 0.5 mL at 08/23/23 1010   sodium chloride flush (NS) 0.9 % injection 10-40 mL, 10-40 mL, Intracatheter, Q12H, Kasa, Kurian, MD, 10 mL at 08/25/23 0933   sodium chloride flush (NS) 0.9 % injection 10-40 mL, 10-40 mL, Intracatheter, PRN, Erin Fulling, MD   sulfamethoxazole-trimethoprim (BACTRIM) 200-40 MG/5ML suspension 20 mL, 20 mL, Per Tube, Q12H, Zawadi Aplin, MD, 20 mL at 08/24/23 2116   [COMPLETED] thiamine (VITAMIN B1) 500 mg in sodium chloride 0.9 %  50 mL IVPB, 500 mg, Intravenous, Q24H, Stopped at 08/21/23 2203 **FOLLOWED BY** thiamine (VITAMIN B1) tablet 100 mg, 100 mg, Per Tube, Daily, Tressie Ellis, RPH, 100 mg at 08/25/23 0900   valproic acid (DEPAKENE) 250 MG/5ML solution 250 mg, 250 mg, Per Tube, Q8H, [name], RPH    ALLERGIES   Patient has no known allergies.    REVIEW OF SYSTEMS     On ventilator unable to obtain ROS  PHYSICAL EXAMINATION   Vital Signs: Temp:  [98.1 F (36.7 C)-99.1 F (37.3 C)] 99.1 F (37.3 C) (01/31 0800) Pulse Rate:  [66-78] 70 (01/31 1000) Resp:  [19-35] 26  (01/31 1000) BP: (95-155)/(60-107) 100/62 (01/31 1000) SpO2:  [85 %-100 %] 90 % (01/31 1000) Weight:  [88.9 kg] 88.9 kg (01/31 0442)  GENERAL:NAD age appropriate HEAD: Normocephalic, atraumatic.  EYES: Pupils equal, round, reactive to light.  No scleral icterus.  MOUTH: Moist mucosal membrane. NECK: Supple. No thyromegaly. No nodules. No JVD.  ETT+ heavy phlegm PULMONARY: MV sounds CARDIOVASCULAR: S1 and S2. Regular rate and rhythm. No murmurs, rubs, or gallops.  GASTROINTESTINAL: Soft, nontender, non-distended. No masses. Positive bowel sounds. No hepatosplenomegaly. Stage 1 ulcer on sacrum MUSCULOSKELETAL: No swelling, clubbing, or edema.  NEUROLOGIC:GCS 4T SKIN:intact,warm,dry   PERTINENT DATA     Infusions:  dexmedetomidine (PRECEDEX) IV infusion 0.8 mcg/kg/hr (08/25/23 1058)   feeding supplement (OSMOLITE 1.5 CAL) Stopped (08/23/23 2130)   Scheduled Medications:  Chlorhexidine Gluconate Cloth  6 each Topical Daily   clonazepam  1 mg Per Tube BID   docusate  100 mg Per Tube BID   enoxaparin (LOVENOX) injection  40 mg Subcutaneous Q24H   feeding supplement (PROSource TF20)  60 mL Per Tube Daily   folic acid  1 mg Per Tube Daily   free water  30 mL Per Tube Q4H   gabapentin  800 mg Per Tube Q8H   glycopyrrolate  0.1 mg Intravenous TID   influenza vac split trivalent PF  0.5 mL Intramuscular Tomorrow-1000   insulin aspart  0-9 Units Subcutaneous Q4H   ipratropium-albuterol  3 mL Nebulization Q6H   nutrition supplement (JUVEN)  1 packet Per Tube BID BM   mouth rinse  15 mL Mouth Rinse 4 times per day   pantoprazole (PROTONIX) IV  40 mg Intravenous Q24H   pneumococcal 20-valent conjugate vaccine  0.5 mL Intramuscular Tomorrow-1000   polyethylene glycol  17 g Per Tube Daily   QUEtiapine  100 mg Per Tube BID   sodium chloride flush  10-40 mL Intracatheter Q12H   sulfamethoxazole-trimethoprim  20 mL Per Tube Q12H   thiamine  100 mg Per Tube Daily   valproic acid  250 mg  Per Tube Q8H   PRN Medications: acetaminophen, docusate, LORazepam, ondansetron (ZOFRAN) IV, mouth rinse, polyethylene glycol, Racepinephrine HCl, sodium chloride flush Hemodynamic parameters:   Intake/Output: 01/30 0701 - 01/31 0700 In: 807.1 [I.V.:649.6; IV Piggyback:157.5] Out: 2400 [Urine:1800; Emesis/NG output:600]  Ventilator  Settings:     LAB RESULTS:  Basic Metabolic Panel: Recent Labs  Lab 08/21/23 0614 08/21/23 1347 08/21/23 2120 08/22/23 0414 08/23/23 0435 08/23/23 2010 08/24/23 0427 08/25/23 0434  NA 141  --   --  142 143  --  141 141  K 3.2*  --  3.3* 3.6 3.7   < > 3.8 3.9  CL 112*  --   --  112* 105  --  107 102  CO2 23  --   --  25 28  --  28 29  GLUCOSE 127*  --   --  156* 131*  --  126* 118*  BUN 16  --   --  9 15  --  12 16  CREATININE 0.76  --   --  0.69 0.62  --  0.53 0.51  CALCIUM 7.8*  --   --  8.0* 8.5*  --  8.6* 8.7*  MG  --  1.7  --  2.2 2.0  --  1.8 1.8  PHOS  --  15.1* 3.4 2.9 3.6  --  3.5 3.3   < > = values in this interval not displayed.   Liver Function Tests: Recent Labs  Lab 08/21/23 0450 08/22/23 0414 08/23/23 0435 08/24/23 0427 08/25/23 0434  ALBUMIN 2.2* 2.4* 2.6* 2.8* 2.7*   No results for input(s): "LIPASE", "AMYLASE" in the last 168 hours.  No results for input(s): "AMMONIA" in the last 168 hours. CBC: Recent Labs  Lab 08/21/23 0450 08/22/23 0414 08/23/23 0435 08/24/23 0427 08/25/23 0434  WBC 9.4 6.9 6.6 9.3 11.7*  HGB 9.6* 10.0* 11.2* 11.8* 12.1  HCT 29.5* 30.9* 35.5* 35.4* 37.7  MCV 93.1 91.7 94.7 89.6 91.5  PLT 174 175 185 192 219   Cardiac Enzymes: No results for input(s): "CKTOTAL", "CKMB", "CKMBINDEX", "TROPONINI" in the last 168 hours. BNP: Invalid input(s): "POCBNP" CBG: Recent Labs  Lab 08/25/23 0113 08/25/23 0336 08/25/23 0733 08/25/23 0938 08/25/23 1116  GLUCAP 117* 113* 152* 133* 116*       IMAGING RESULTS:     ASSESSMENT AND PLAN    -Multidisciplinary rounds held  today  #Acute Hypoxic Respiratory Failure secondary to suspected drug overdose of undetermined intent & suspected Aspiration #Post extubation Stridor ~ TREATED  PMHx: COPD, Asthma, OSA EXTUBATED 1/24 & Reintubated 1/25 -Full vent support, implement lung protective strategies -Plateau pressures less than 30 cm H20 -Wean FiO2 & PEEP as tolerated to maintain O2 sats >92% -Follow intermittent Chest X-ray & ABG as needed -Spontaneous Breathing Trials when respiratory parameters met and mental status permits -Implement VAP Bundle -Prn Bronchodilators -ABX -zosyn >> augmentin- staph aureus -noted thick secretions per ETT recultured +glycopyrollate    #Hypotension: Septic shock vs Sedation related Echocardiogram: 08/17/23:  LVEF 60-65%, normal diastolic parameters, RV systolic function normal -Continuous cardiac monitoring -Maintain MAP >65 -IV fluids -Vasopressors as needed to maintain MAP goal - Midodrine 10 mg TID>>reduced to 5 tid  -Lactic acid is normalized -HS Troponin negative x2 -hold outpatient losartan  -IVF - LR 1L bolus   #Multifocal Pneumonia, questionable aspiration -Monitor fever curve -Trend WBC's & Procalcitonin -Follow cultures as above    #Acute Kidney Injury #Hypokalemia ~ RESOLVED #Mild NAGMA ~ RESOLVED -Monitor I&O's / urinary output -Follow BMP -Ensure adequate renal perfusion -Avoid nephrotoxic agents as able -Replace electrolytes as indicated ~ Pharmacy following for assistance with electrolyte replacement -IV fluids -Consider Renal Ultrasound   #Type 2 Diabetes Mellitus Hemoglobin A1C: 6.3 (05/2023) -CBG's q4h; Target range of 140 to 180 -SSI -Follow ICU Hypo/Hyperglycemia protocol   #Acute Encephalopathy suspect secondary to drug overdose of undetermined intent in the setting of known history of Cocaine Abuse #ETOH use with Delirium Tremens #Sedation needs in setting of mechanical ventilation #Anxiety & Depression #Bipolar Disorder UDS +  cocaine, marijuana & amphetamines. Blood Alcohol level elevated at 190 -Maintain a RASS goal of 0 to -1 -Fentanyl and Propofol as needed to maintain RASS goal -Avoid sedating medications as able -Daily wake up assessment -CIWA protocol -Continue Phenobarb taper -High dose thiamine x3  days followed by 100 mg daily -Folic acid and MVI -Hold outpatient regimen: Seroquel, effexor xr, vraylar. Consider restarting as patient stabilizes -once stabilized will need to screen for suicidal ideation         Best Practice (right click and "Reselect all SmartList Selections" daily)  Diet/type: NPO w/ meds via tube DVT prophylaxis LMWH Pressure ulcer(s): N/A GI prophylaxis: H2B Lines: N/A Foley:  Yes, and it is still needed Code Status:  full code Last date of multidisciplinary goals of care discussion [1/26]    ID -continue IV abx as prescibed -follow up cultures  GI/Nutrition GI PROPHYLAXIS as indicated DIET-->TF's as tolerated Constipation protocol as indicated  ENDO - ICU hypoglycemic\Hyperglycemia protocol -check FSBS per protocol   ELECTROLYTES -follow labs as needed -replace as needed -pharmacy consultation   DVT/GI PRX ordered -SCDs  TRANSFUSIONS AS NEEDED MONITOR FSBS ASSESS the need for LABS as needed    Critical care provider statement:   Total critical care time: 33 minutes   Performed by: Karna Christmas MD   Critical care time was exclusive of separately billable procedures and treating other patients.   Critical care was necessary to treat or prevent imminent or life-threatening deterioration.   Critical care was time spent personally by me on the following activities: development of treatment plan with patient and/or surrogate as well as nursing, discussions with consultants, evaluation of patient's response to treatment, examination of patient, obtaining history from patient or surrogate, ordering and performing treatments and interventions, ordering and  review of laboratory studies, ordering and review of radiographic studies, pulse oximetry and re-evaluation of patient's condition.    Vida Rigger, M.D.  Pulmonary & Critical Care Medicine

## 2023-08-25 NOTE — Procedures (Signed)
Intubation Procedure Note  Andrea Sanford  [id number]  11-19-1970  Date:08/25/23  Time:6:01 PM   Provider Performing:Brownie Gockel D Elvina Sidle    Procedure: Intubation (31500)  Indication(s) Respiratory Failure  Consent Risks of the procedure as well as the alternatives and risks of each were explained to the patient and/or caregiver.  Consent for the procedure was obtained and is signed in the bedside chart   Anesthesia Etomidate, Fentanyl, and Rocuronium   Time Out Verified patient identification, verified procedure, site/side was marked, verified correct patient position, special equipment/implants available, medications/allergies/relevant history reviewed, required imaging and test results available.   Sterile Technique Usual hand hygeine, masks, and gloves were used   Procedure Description Patient positioned in bed supine.  Sedation given as noted above.  Patient was intubated with endotracheal tube using Glidescope.  View was Grade 1 full glottis .  Number of attempts was 1.  Colorimetric CO2 detector was consistent with tracheal placement.   Complications/Tolerance None; patient tolerated the procedure well. Chest X-ray is ordered to verify placement.   EBL Minimal   Specimen(s) None    Size 8.0 ETT Tube secured at 23 cm at the lip    Harlon Ditty, AGACNP-BC  Pulmonary & Critical Care Prefer epic messenger for cross cover needs If after hours, please call E-link

## 2023-08-25 NOTE — Consult Note (Signed)
PHARMACY CONSULT NOTE - ELECTROLYTES  Pharmacy Consult for Electrolyte Monitoring and Replacement   Recent Labs: Potassium (mmol/L)  Date Value  08/25/2023 3.9  10/15/2012 3.0 (L)   Magnesium (mg/dL)  Date Value  16/04/9603 1.8  10/04/2011 1.4 (L)   Calcium (mg/dL)  Date Value  54/03/8118 8.7 (L)   Calcium, Total (mg/dL)  Date Value  14/78/2956 8.3 (L)   Albumin (g/dL)  Date Value  21/30/8657 2.7 (L)  06/13/2023 4.3  10/15/2012 3.9   Phosphorus (mg/dL)  Date Value  84/69/6295 3.3   Sodium (mmol/L)  Date Value  08/25/2023 141  06/13/2023 142  10/15/2012 141   Height: 5\' 6"  (167.6 cm) Weight: 88.9 kg (195 lb 15.8 oz) IBW/kg (Calculated) : 59.3 Estimated Creatinine Clearance: 90.2 mL/min (by C-G formula based on SCr of 0.51 mg/dL).  Assessment  Andrea Sanford is a 53 y.o. female presenting with drug overdose / intoxication. PMH significant for polysubstance abuse, anxiety / depression, asthma / COPD, bipolar disorder, DM, neuromuscular disorder. Pharmacy has been consulted to monitor and replace electrolytes.  Goal of Therapy: Electrolytes within normal limits  Plan:  No electrolyte replacement warranted for today Electrolytes with labs tomorrow AM  Thank you for allowing pharmacy to be a part of this patient's care.  [name] 08/25/2023 7:07 AM

## 2023-08-25 NOTE — Progress Notes (Addendum)
Pt's WOB increased to the upper 20's to low 30's, O2 saturations dropping to mid 80's despite 100% HHFNC.  Concern for ongoing aspiration given her AMS and difficultly coughing up and managing secretions    Called and discussed with pt's mother to inform her of decline and need for intubation.  She is in agreement with and consents to intubation.     Harlon Ditty, AGACNP-BC [place] [facility] Prefer epic messenger for cross cover needs If after hours, please call E-link

## 2023-08-25 NOTE — Plan of Care (Signed)
Pt more calm today, on precedex, currently at 0.9 mcg/kg/hour, and also receiving Seroquel and clonazepam via NGT. No prn's for sedation needed so far this shift but did receive ativan twice during the night. Opens eyes to voice and resists mouthcare and other interventions; quickly goes back to sleep; unable to follow commands. TF remain on hold per Dr. Karna Christmas, but no further vomiting this shift or during night. NGT to LIS until this am; last recorded output 600 cc yesterday at 6 pm except for 100 cc out this am. NGT was repositioned and confirmed with KUB; receiving meds via NGT. Continues to have high O2 requirements; 12-14 LPM with sat 94 at this time; will cont to wean as tolerated.  Problem: Coping: Goal: Ability to adjust to condition or change in health will improve Outcome: Progressing   Problem: Fluid Volume: Goal: Ability to maintain a balanced intake and output will improve Outcome: Progressing   Problem: Metabolic: Goal: Ability to maintain appropriate glucose levels will improve Outcome: Progressing   Problem: Nutritional: Goal: Maintenance of adequate nutrition will improve Outcome: Progressing Goal: Progress toward achieving an optimal weight will improve Outcome: Progressing   Problem: Skin Integrity: Goal: Risk for impaired skin integrity will decrease Outcome: Progressing   Problem: Tissue Perfusion: Goal: Adequacy of tissue perfusion will improve Outcome: Progressing   Problem: Clinical Measurements: Goal: Ability to maintain clinical measurements within normal limits will improve Outcome: Progressing Goal: Will remain free from infection Outcome: Progressing Goal: Diagnostic test results will improve Outcome: Progressing Goal: Respiratory complications will improve Outcome: Progressing Goal: Cardiovascular complication will be avoided Outcome: Progressing   Problem: Activity: Goal: Risk for activity intolerance will decrease Outcome: Progressing    Problem: Nutrition: Goal: Adequate nutrition will be maintained Outcome: Progressing   Problem: Coping: Goal: Level of anxiety will decrease Outcome: Progressing   Problem: Elimination: Goal: Will not experience complications related to bowel motility Outcome: Progressing Goal: Will not experience complications related to urinary retention Outcome: Progressing   Problem: Pain Managment: Goal: General experience of comfort will improve and/or be controlled Outcome: Progressing   Problem: Safety: Goal: Ability to remain free from injury will improve Outcome: Progressing   Problem: Skin Integrity: Goal: Risk for impaired skin integrity will decrease Outcome: Progressing

## 2023-08-25 NOTE — Progress Notes (Signed)
Nutrition Follow-up  DOCUMENTATION CODES:   Obesity unspecified  INTERVENTION:   Once ok to resume tube feeds:  Osmolite 1.5@60ml /hr continuous- Initiate at 31ml/hr and increase by 17ml/hr q 8 hours until goal rate is reached  ProSource TF 20- Give 60ml daily via tube, each supplement provides 80kcal and 20g of protein.   Free water flushes 30ml q4 hours to maintain tube patency   Regimen provides 2240kcal/day, 110g/day protein and 1256ml/day of free water.   Juven Fruit Punch BID via tube, each serving provides 95kcal and 2.5g of protein (amino acids glutamine and arginine)  Daily weights   Pt at refeed risk; recommend monitor potassium, magnesium and phosphorus labs daily until stable  NUTRITION DIAGNOSIS:   Inadequate oral intake related to inability to eat as evidenced by NPO status. -ongoing   GOAL:   Patient will meet greater than or equal to 90% of their needs -not met   MONITOR:   Diet advancement, Labs, Weight trends, TF tolerance, I & O's, Skin  ASSESSMENT:   53 y/o female with h/o DM, bipolar disorder, substance abuse, anxiety, depression, COPD, OSA and homelessness who is admitted with AKI, overdose and aspiration PNA.  Pt previously tolerating tube feeds via NGT. Tube feeds held yesterday as pt reported to have vomited overnight on the 29th. NGT to LIS with output. KUB today with no significant findings. No distension noted on exam. Pt is having BMs. Pt is more calm today. Per MD, will continue to hold tube feeds. Per chart, pt is weight stable since admission.   Medications reviewed and include: colace, lovenox, folic acid, insulin, juven, protonix, miralax, bactrim  Labs reviewed: K 3.9 wnl, P 3.3 wnl, Mg 1.8 wnl Wbc- 11.7(H) Cbgs- 116, 133, 152, 113, 117 x 24 hrs   UOP-  Diet Order:   Diet Order             Diet NPO time specified  Diet effective now                  EDUCATION NEEDS:   Not appropriate for education at this  time  Skin:  Skin Assessment: Reviewed RN Assessment (Stage II buttocks, laceration L knee) Skin Integrity Issues:: Stage I Stage I: coccyx  Last BM:  1/31- type 6  Height:   Ht Readings from Last 1 Encounters:  08/17/23 5\' 6"  (1.676 m)    Weight:   Wt Readings from Last 1 Encounters:  08/25/23 88.9 kg    Ideal Body Weight:  61.4 kg  BMI:  Body mass index is 31.63 kg/m.  Estimated Nutritional Needs:   Kcal:  1900-2200kcal/day  Protein:  95-110g/day  Fluid:  1.8-2.1L/day  Betsey Holiday MS, RD, LDN If unable to be reached, please send secure chat to "RD inpatient" available from 8:00a-4:00p daily

## 2023-08-25 NOTE — Progress Notes (Signed)
Pt noted to have increased WOB this afternoon with resps in upper 20's to 30's. Sats 87-91% on 15 LPM HFNC. Dr. Karna Christmas and Daisy Floro in to see patient. O2 apparatus changed to Heated high flow at 100% and 50 liters and precedex. Intermittently with adequate sats in mid 90's, and also sats down into the 80's.

## 2023-08-25 NOTE — Plan of Care (Signed)
  Problem: Education: Goal: Ability to describe self-care measures that may prevent or decrease complications (Diabetes Survival Skills Education) will improve Outcome: Not Progressing   Problem: Coping: Goal: Ability to adjust to condition or change in health will improve Outcome: Not Progressing   Problem: Fluid Volume: Goal: Ability to maintain a balanced intake and output will improve Outcome: Not Progressing   Problem: Health Behavior/Discharge Planning: Goal: Ability to identify and utilize available resources and services will improve Outcome: Not Progressing Goal: Ability to manage health-related needs will improve Outcome: Not Progressing   Problem: Metabolic: Goal: Ability to maintain appropriate glucose levels will improve Outcome: Not Progressing   Problem: Nutritional: Goal: Maintenance of adequate nutrition will improve Outcome: Not Progressing Goal: Progress toward achieving an optimal weight will improve Outcome: Not Progressing   Problem: Skin Integrity: Goal: Risk for impaired skin integrity will decrease Outcome: Not Progressing   Problem: Tissue Perfusion: Goal: Adequacy of tissue perfusion will improve Outcome: Not Progressing   Problem: Education: Goal: Knowledge of General Education information will improve Description: Including pain rating scale, medication(s)/side effects and non-pharmacologic comfort measures Outcome: Not Progressing   Problem: Health Behavior/Discharge Planning: Goal: Ability to manage health-related needs will improve Outcome: Not Progressing   Problem: Clinical Measurements: Goal: Ability to maintain clinical measurements within normal limits will improve Outcome: Not Progressing Goal: Will remain free from infection Outcome: Not Progressing Goal: Diagnostic test results will improve Outcome: Not Progressing Goal: Respiratory complications will improve Outcome: Not Progressing Goal: Cardiovascular complication will  be avoided Outcome: Not Progressing   Problem: Activity: Goal: Risk for activity intolerance will decrease Outcome: Not Progressing   Problem: Nutrition: Goal: Adequate nutrition will be maintained Outcome: Not Progressing   Problem: Coping: Goal: Level of anxiety will decrease Outcome: Not Progressing   Problem: Elimination: Goal: Will not experience complications related to bowel motility Outcome: Not Progressing Goal: Will not experience complications related to urinary retention Outcome: Not Progressing   Problem: Pain Managment: Goal: General experience of comfort will improve and/or be controlled Outcome: Not Progressing   Problem: Safety: Goal: Ability to remain free from injury will improve Outcome: Not Progressing   Problem: Skin Integrity: Goal: Risk for impaired skin integrity will decrease Outcome: Not Progressing   Problem: Education: Goal: Knowledge of disease or condition will improve Outcome: Not Progressing Goal: Knowledge of the prescribed therapeutic regimen will improve Outcome: Not Progressing   Problem: Activity: Goal: Ability to tolerate increased activity will improve Outcome: Not Progressing Goal: Will verbalize the importance of balancing activity with adequate rest periods Outcome: Not Progressing   Problem: Respiratory: Goal: Ability to maintain a clear airway will improve Outcome: Not Progressing Goal: Levels of oxygenation will improve Outcome: Not Progressing Goal: Ability to maintain adequate ventilation will improve Outcome: Not Progressing   Problem: Activity: Goal: Ability to tolerate increased activity will improve Outcome: Not Progressing   Problem: Respiratory: Goal: Ability to maintain a clear airway and adequate ventilation will improve Outcome: Not Progressing   Problem: Role Relationship: Goal: Method of communication will improve Outcome: Not Progressing

## 2023-08-26 LAB — CBC
HCT: 35.1 % — ABNORMAL LOW (ref 36.0–46.0)
Hemoglobin: 11.2 g/dL — ABNORMAL LOW (ref 12.0–15.0)
MCH: 29.9 pg (ref 26.0–34.0)
MCHC: 31.9 g/dL (ref 30.0–36.0)
MCV: 93.6 fL (ref 80.0–100.0)
Platelets: 164 10*3/uL (ref 150–400)
RBC: 3.75 MIL/uL — ABNORMAL LOW (ref 3.87–5.11)
RDW: 12.3 % (ref 11.5–15.5)
WBC: 7.8 10*3/uL (ref 4.0–10.5)
nRBC: 0 % (ref 0.0–0.2)

## 2023-08-26 LAB — RENAL FUNCTION PANEL
Albumin: 2.7 g/dL — ABNORMAL LOW (ref 3.5–5.0)
Anion gap: 14 (ref 5–15)
BUN: 23 mg/dL — ABNORMAL HIGH (ref 6–20)
CO2: 23 mmol/L (ref 22–32)
Calcium: 8 mg/dL — ABNORMAL LOW (ref 8.9–10.3)
Chloride: 98 mmol/L (ref 98–111)
Creatinine, Ser: 0.79 mg/dL (ref 0.44–1.00)
GFR, Estimated: >60 mL/min (ref >60)
Glucose, Bld: 207 mg/dL — ABNORMAL HIGH (ref 70–99)
Phosphorus: 4.7 mg/dL — ABNORMAL HIGH (ref 2.5–4.6)
Potassium: 3.5 mmol/L (ref 3.5–5.1)
Sodium: 135 mmol/L (ref 135–145)

## 2023-08-26 LAB — TRIGLYCERIDES: Triglycerides: 492 mg/dL — ABNORMAL HIGH (ref <150)

## 2023-08-26 MED ORDER — ORAL CARE MOUTH RINSE
15.0000 mL | OROMUCOSAL | Status: DC
  Administered 2023-08-26 – 2023-08-30 (×46): 15 mL via OROMUCOSAL

## 2023-08-26 MED ORDER — IPRATROPIUM-ALBUTEROL 0.5-2.5 (3) MG/3ML IN SOLN: 3.0000 mL | Freq: Four times a day (QID) | RESPIRATORY_TRACT | Status: DC | PRN

## 2023-08-26 MED ORDER — ORAL CARE MOUTH RINSE: 15.0000 mL | OROMUCOSAL | Status: DC | PRN

## 2023-08-26 MED ORDER — POTASSIUM CHLORIDE 20 MEQ PO PACK
40.0000 meq | PACK | Freq: Once | ORAL | Status: AC
  Administered 2023-08-26: 40 meq
  Filled 2023-08-26: qty 2

## 2023-08-26 NOTE — Progress Notes (Signed)
CRITICAL CARE PROGRESS NOTE    Name: Andrea Sanford MRN: [id number] DOB: 1970-11-07     LOS: 9   SUBJECTIVE FINDINGS & SIGNIFICANT EVENTS    History of Presenting Illness:  -patient with polysubstance abuse including drugs and alcohol.  She was found unresponsive.  She has PMH of psychiatric conditions, lifelong smoking, homelesness, COPD, anxiety and depression.    ED course: Upon arrival patient obtunded, tachypneic, hypothermic and hypertensive being supported with a BVM. Patient emergently intubated for airway protection in the setting of suspected drug overdose, becoming hypotensive after induction requiring vasopressor support. Labs significant for hypokalemia and mild NAGMA.   Imaging revealed vascular congestion on CXR with no acute abnormality on CTH.  Medications given: etomidate & succinylcholine, 3 L bolus, levophed drip started Initial Vitals: 95.1, 22, 95, 168/138, 90% BVM Significant labs: (Labs/ Imaging personally reviewed) I, [name], AGACNP-BC, personally viewed and interpreted this ECG. EKG Interpretation: Date: 08/17/23, EKG Time: 04:45, Rate: 77, Rhythm: NSR, QRS Axis:  normal, Intervals: borderline prolonged Qtc, ST/T Wave abnormalities: very mild STE in leads I, II and avL (does not meet STEMI criteria), Narrative Interpretation: NSR Chemistry: Na+:138, K+: 3.1, BUN/Cr.: 11/0.84, Serum CO2/ AG: 20/ 11 Hematology: WBC: 9.8, Hgb: 13.6,  Troponin: 6, BNP: pending, Lactic/ PCT: 0.9/pending,  COVID-19 & Influenza A/B: pending   Alcohol level: 190, Acetaminophen: <10, Salicylate: <7 UDS: +amphetamines, marijuana, cocaine   ABG: 7.27/49/173/22.5 CXR 08/17/23:  Low volume film with vascular congestion. Large gastric bubble despite the presence of an NG tube. CT head wo contrast  08/17/23: No acute intracranial abnormality identified. Mild but progressed cerebral white matter changes since 2019, most commonly due to small vessel disease. Chronic left facial fractures.   08/21/23- patient with heavy dark inspissated ETT secretions with bronchospasm. RT worried about increased resistance on MV due to thick resp mucus. We plan to perform bronchoscopy I reviewed medical findings with [place] mother of patient .  08/22/23- patient failed SBT today, failed with AMS and hypoxemia 08/25/23-patient is awake but aggitated and requiring sitter due to concerns for fall out of bed.  High risk for withdrawal from drug/alcohol abuse.   Trach aspirate with staph aureus.  She had vomit overnight.  Patient has increased O2 requirement and has been advanced to Heated HFNC.  08/26/23- patient progressively got worse with severe hypoxemia and severe aggitation required reintubation again for there third time. She may need tracheostomy for prolonged weaning from MV.   Lines/tubes : Airway 8 mm (Active)  Secured at (cm) 22 cm 08/21/23 0826  Measured From Lips 08/21/23 0826  Secured Location Left 08/21/23 0826  Secured By [employer] 08/21/23 0826  Bite Block No 08/21/23 0826  Tube Holder Repositioned Yes 08/21/23 0826  Prone position No 08/21/23 0826  Cuff Pressure (cm H2O) Green OR 18-26 [facility] 08/21/23 0826  Site Condition Dry 08/21/23 0826     PICC Triple Lumen 08/20/23 Right Basilic 39 cm 0 cm (Active)  Indication for Insertion or Continuance of Line Vasoactive infusions 08/21/23 0733  Exposed Catheter (cm) 0 cm 08/21/23 0733  Site Assessment Clean, Dry, Intact 08/21/23 0733  Lumen #1 Status In-line blood sampling system in place 08/21/23 0733  Lumen #2 Status Flushed;Infusing;Blood return noted 08/21/23 0733  Lumen #3 Status Infusing;Flushed;Blood return noted 08/21/23 1191  Dressing Type Transparent;Securing device 08/21/23 4782  Dressing Status Antimicrobial  disc/dressing in place 08/21/23 9562  Line Care Connections checked and tightened 08/21/23 1308  Line Adjustment (NICU/IV Team  Only) No 08/20/23 1029  Dressing Intervention New dressing;Adhesive placed at insertion site (IV team only);Adhesive placed around edges of dressing (IV team/ICU RN only) 08/20/23 1029  Dressing Change Due 08/27/23 08/21/23 0733     NG/OG Vented/Dual Lumen 14 Fr. Right nare Marking at nare/corner of mouth 70 cm (Active)  Tube Position (Required) Marking at nare/corner of mouth 08/21/23 0736  Measurement (cm) (Required) 70 cm 08/21/23 0736  Ongoing Placement Verification (Required) (See row information) Yes 08/21/23 0736  Site Assessment Clean, Dry, Intact 08/21/23 0736  Interventions Clamped 08/21/23 0736  Status Clamped 08/21/23 0736  Intake (mL) 30 mL 08/21/23 0736  Output (mL) 0 mL 08/21/23 0600     Urethral Catheter C. Vazquez Non-latex 14 Fr. (Active)  Indication for Insertion or Continuance of Catheter Acute urinary retention (I&O Cath for 24 hrs prior to catheter insertion- Inpatient Only) 08/21/23 0736  Site Assessment Clean, Dry, Intact 08/21/23 0736  Catheter Maintenance Bag below level of bladder;Catheter secured;Drainage bag/tubing not touching floor;Insertion date on drainage bag;No dependent loops;Seal intact;Bag emptied prior to transport 08/21/23 0736  Collection Container Standard drainage bag 08/21/23 0736  Securement Method Adhesive securement device 08/21/23 0736  Output (mL) 70 mL 08/21/23 0600    Microbiology/Sepsis markers: Results for orders placed or performed during the hospital encounter of 08/17/23  Culture, blood (routine x 2)     Status: None   Collection Time: 08/17/23  4:52 AM   Specimen: BLOOD  Result Value Ref Range Status   Specimen Description BLOOD LEFT ANTECUBITAL  Final   Special Requests   Final    BOTTLES DRAWN AEROBIC AND ANAEROBIC Blood Culture results may not be optimal due to an excessive volume of blood received  in culture bottles   Culture   Final    NO GROWTH 5 DAYS Performed at [facility], [address], [place], [place] [address]    Report Status 08/22/2023 FINAL  Final  Culture, blood (routine x 2)     Status: None   Collection Time: 08/17/23  4:52 AM   Specimen: BLOOD  Result Value Ref Range Status   Specimen Description BLOOD BLOOD LEFT HAND  Final   Special Requests   Final    BOTTLES DRAWN AEROBIC AND ANAEROBIC Blood Culture adequate volume   Culture   Final    NO GROWTH 5 DAYS Performed at [facility], [address] [place], [place] [address]    Report Status 08/22/2023 FINAL  Final  Resp panel by RT-PCR (RSV, Flu A&B, Covid) Anterior Nasal Swab     Status: None   Collection Time: 08/17/23  5:11 AM   Specimen: Anterior Nasal Swab  Result Value Ref Range Status   SARS Coronavirus 2 by RT PCR NEGATIVE NEGATIVE Final    Comment: (NOTE) SARS-CoV-2 target nucleic acids are NOT DETECTED.  The SARS-CoV-2 RNA is generally detectable in upper respiratory specimens during the acute phase of infection. The lowest concentration of SARS-CoV-2 viral copies this assay can detect is 138 copies/mL. A negative result does not preclude SARS-Cov-2 infection and should not be used as the sole basis for treatment or other patient management decisions. A negative result may occur with  improper specimen collection/handling, submission of specimen other than nasopharyngeal swab, presence of viral mutation(s) within the areas targeted by this assay, and inadequate number of viral copies(<138 copies/mL). A negative result must be combined with clinical observations, patient history, and epidemiological information. The expected result is Negative.  Fact Sheet for Patients:  BloggerCourse.com  Fact Sheet for Healthcare Providers:  SeriousBroker.it  This test is no t yet approved or cleared by the [place] FDA and   has been authorized for detection and/or diagnosis of SARS-CoV-2 by FDA under an Emergency Use Authorization (EUA). This EUA will remain  in effect (meaning this test can be used) for the duration of the COVID-19 declaration under Section 564(b)(1) of the Act, 21 U.S.C.section 360bbb-3(b)(1), unless the authorization is terminated  or revoked sooner.       Influenza A by PCR NEGATIVE NEGATIVE Final   Influenza B by PCR NEGATIVE NEGATIVE Final    Comment: (NOTE) The Xpert Xpress SARS-CoV-2/FLU/RSV plus assay is intended as an aid in the diagnosis of influenza from Nasopharyngeal swab specimens and should not be used as a sole basis for treatment. Nasal washings and aspirates are unacceptable for Xpert Xpress SARS-CoV-2/FLU/RSV testing.  Fact Sheet for Patients: BloggerCourse.com  Fact Sheet for Healthcare Providers: SeriousBroker.it  This test is not yet approved or cleared by the [place] FDA and has been authorized for detection and/or diagnosis of SARS-CoV-2 by FDA under an Emergency Use Authorization (EUA). This EUA will remain in effect (meaning this test can be used) for the duration of the COVID-19 declaration under Section 564(b)(1) of the Act, 21 U.S.C. section 360bbb-3(b)(1), unless the authorization is terminated or revoked.     Resp Syncytial Virus by PCR NEGATIVE NEGATIVE Final    Comment: (NOTE) Fact Sheet for Patients: BloggerCourse.com  Fact Sheet for Healthcare Providers: SeriousBroker.it  This test is not yet approved or cleared by the [place] FDA and has been authorized for detection and/or diagnosis of SARS-CoV-2 by FDA under an Emergency Use Authorization (EUA). This EUA will remain in effect (meaning this test can be used) for the duration of the COVID-19 declaration under Section 564(b)(1) of the Act, 21 U.S.C. section 360bbb-3(b)(1),  unless the authorization is terminated or revoked.  Performed at [facility], [address] [place], [place] [address]   MRSA Next Gen by PCR, Nasal     Status: None   Collection Time: 08/17/23  6:00 AM   Specimen: Nasal Mucosa; Nasal Swab  Result Value Ref Range Status   MRSA by PCR Next Gen NOT DETECTED NOT DETECTED Final    Comment: (NOTE) The GeneXpert MRSA Assay (FDA approved for NASAL specimens only), is one component of a comprehensive MRSA colonization surveillance program. It is not intended to diagnose MRSA infection nor to guide or monitor treatment for MRSA infections. Test performance is not FDA approved in patients less than 24 years old. Performed at [facility], [address] [place], [place] [address]   Culture, blood (Routine X 2) w Reflex to ID Panel     Status: None   Collection Time: 08/19/23  4:23 PM   Specimen: BLOOD  Result Value Ref Range Status   Specimen Description BLOOD BLOOD RIGHT ARM AEROBIC BOTTLE ONLY  Final   Special Requests   Final    BOTTLES DRAWN AEROBIC ONLY Blood Culture results may not be optimal due to an inadequate volume of blood received in culture bottles   Culture   Final    NO GROWTH 5 DAYS Performed at [facility], [address], [place], [place] [address]    Report Status 08/24/2023 FINAL  Final  Culture, blood (Routine X 2) w Reflex to ID Panel     Status: None   Collection Time: 08/19/23  4:23 PM   Specimen:  BLOOD  Result Value Ref Range Status   Specimen Description BLOOD BLOOD RIGHT HAND AEROBIC BOTTLE ONLY  Final   Special Requests   Final    BOTTLES DRAWN AEROBIC ONLY Blood Culture results may not be optimal due to an inadequate volume of blood received in culture bottles   Culture   Final    NO GROWTH 5 DAYS Performed at [facility], [address] [place], [place] [address]    Report Status 08/24/2023 FINAL  Final  Respiratory (~20 pathogens) panel by PCR     Status:  None   Collection Time: 08/20/23 11:04 AM   Specimen: Nasopharyngeal Swab; Respiratory  Result Value Ref Range Status   Adenovirus NOT DETECTED NOT DETECTED Final   Coronavirus 229E NOT DETECTED NOT DETECTED Final    Comment: (NOTE) The Coronavirus on the Respiratory Panel, DOES NOT test for the novel  Coronavirus (2019 nCoV)    Coronavirus HKU1 NOT DETECTED NOT DETECTED Final   Coronavirus NL63 NOT DETECTED NOT DETECTED Final   Coronavirus OC43 NOT DETECTED NOT DETECTED Final   Metapneumovirus NOT DETECTED NOT DETECTED Final   Rhinovirus / Enterovirus NOT DETECTED NOT DETECTED Final   Influenza A NOT DETECTED NOT DETECTED Final   Influenza B NOT DETECTED NOT DETECTED Final   Parainfluenza Virus 1 NOT DETECTED NOT DETECTED Final   Parainfluenza Virus 2 NOT DETECTED NOT DETECTED Final   Parainfluenza Virus 3 NOT DETECTED NOT DETECTED Final   Parainfluenza Virus 4 NOT DETECTED NOT DETECTED Final   Respiratory Syncytial Virus NOT DETECTED NOT DETECTED Final   Bordetella pertussis NOT DETECTED NOT DETECTED Final   Bordetella Parapertussis NOT DETECTED NOT DETECTED Final   Chlamydophila pneumoniae NOT DETECTED NOT DETECTED Final   Mycoplasma pneumoniae NOT DETECTED NOT DETECTED Final    Comment: Performed at [facility], [address], [place], [place] [address]  Culture, Respiratory w Gram Stain     Status: None   Collection Time: 08/20/23  1:56 PM   Specimen: Tracheal Aspirate; Respiratory  Result Value Ref Range Status   Specimen Description   Final    TRACHEAL ASPIRATE Performed at [facility], [address], [place], [place] [address]    Special Requests   Final    NONE Performed at [facility], [address] [place], [place] [address]    Gram Stain   Final    FEW WBC PRESENT, PREDOMINANTLY PMN RARE GRAM POSITIVE COCCI Performed at [facility], [address] [place], [place] [address]    Culture FEW STAPHYLOCOCCUS AUREUS  Final    Report Status 08/24/2023 FINAL  Final   Organism ID, Bacteria STAPHYLOCOCCUS AUREUS  Final      Susceptibility   Staphylococcus aureus - MIC*    CIPROFLOXACIN <=0.5 SENSITIVE Sensitive     ERYTHROMYCIN >=8 RESISTANT Resistant     GENTAMICIN <=0.5 SENSITIVE Sensitive     OXACILLIN RESISTANT Resistant     TETRACYCLINE <=1 SENSITIVE Sensitive     VANCOMYCIN <=0.5 SENSITIVE Sensitive     TRIMETH/SULFA <=10 SENSITIVE Sensitive     CLINDAMYCIN <=0.25 SENSITIVE Sensitive     RIFAMPIN <=0.5 SENSITIVE Sensitive     Inducible Clindamycin NEGATIVE Sensitive     LINEZOLID 2 SENSITIVE Sensitive     * FEW STAPHYLOCOCCUS AUREUS  Culture, BAL-quantitative w Gram Stain     Status: Abnormal   Collection Time: 08/21/23  1:05 PM   Specimen: Bronchoalveolar Lavage; Respiratory  Result Value Ref  Range Status   Specimen Description   Final    BRONCHIAL ALVEOLAR LAVAGE Performed at [facility], [address] [place], [place] [address]    Special Requests   Final    NONE Performed at [facility], [address] [place], [place] [address]    Gram Stain   Final    ABUNDANT WBC PRESENT, PREDOMINANTLY PMN NO ORGANISMS SEEN Performed at [facility], [address], [place], [place] [address]    Culture 10,000 COLONIES/mL STAPHYLOCOCCUS AUREUS (A)  Final   Report Status 08/24/2023 FINAL  Final   Organism ID, Bacteria STAPHYLOCOCCUS AUREUS (A)  Final      Susceptibility   Staphylococcus aureus - MIC*    CIPROFLOXACIN <=0.5 SENSITIVE Sensitive     ERYTHROMYCIN >=8 RESISTANT Resistant     GENTAMICIN <=0.5 SENSITIVE Sensitive     OXACILLIN 0.5 SENSITIVE Sensitive     TETRACYCLINE <=1 SENSITIVE Sensitive     VANCOMYCIN 1 SENSITIVE Sensitive     TRIMETH/SULFA <=10 SENSITIVE Sensitive     CLINDAMYCIN <=0.25 SENSITIVE Sensitive     RIFAMPIN <=0.5 SENSITIVE Sensitive     Inducible Clindamycin NEGATIVE Sensitive     LINEZOLID 2 SENSITIVE Sensitive     * 10,000 COLONIES/mL  STAPHYLOCOCCUS AUREUS    Anti-infectives:  Anti-infectives (From admission, onward)    Start     Dose/Rate Route Frequency Ordered Stop   08/23/23 1800  sulfamethoxazole-trimethoprim (BACTRIM) 200-40 MG/5ML suspension 20 mL        20 mL Per Tube Every 12 hours 08/23/23 1507 08/30/23 2159   08/21/23 1800  amoxicillin-clavulanate (AUGMENTIN) 400-57 MG/5ML suspension 875 mg  Status:  Discontinued        875 mg Per Tube Every 12 hours 08/21/23 1148 08/23/23 1457   08/19/23 1730  piperacillin-tazobactam (ZOSYN) IVPB 3.375 g  Status:  Discontinued        3.375 g 12.5 mL/hr over 240 Minutes Intravenous Every 8 hours 08/19/23 1633 08/21/23 1146         PAST MEDICAL HISTORY   Past Medical History:  Diagnosis Date   Allergy    Anxiety    Arthritis    Asthma    Bipolar 1 disorder (HCC)    Cancer (HCC)    COPD (chronic obstructive pulmonary disease) (HCC)    Depression    Diabetes mellitus without complication (HCC)    Hypertension    Neuromuscular disorder (HCC)    Osteoporosis    Sleep apnea      SURGICAL HISTORY   Past Surgical History:  Procedure Laterality Date   ANKLE SURGERY Right    PERIPHERAL VASCULAR THROMBECTOMY Left 1991     FAMILY HISTORY   Family History  Problem Relation Age of Onset   Depression Mother    Diabetes Mother    Cancer Father      SOCIAL HISTORY   Social History   Tobacco Use   Smoking status: Every Day    Current packs/day: 1.00    Types: Cigarettes   Smokeless tobacco: Never  Substance Use Topics   Alcohol use: Not Currently    Alcohol/week: 2.0 standard drinks of alcohol    Types: 2 Standard drinks or equivalent per week   Drug use: Yes    Types: Cocaine    Comment: uses on occasions     MEDICATIONS   Current Medication:  Current Facility-Administered Medications:    acetaminophen (TYLENOL) tablet 650 mg, 650 mg, Per Tube, Q4H PRN, Rust-Chester,  Cecelia Byars, NP, 650 mg at 08/21/23 1337   Chlorhexidine Gluconate Cloth  2 % PADS 6 each, 6 each, Topical, Daily, Erin Fulling, MD, 6 each at 08/25/23 0853   clonazepam (KLONOPIN) disintegrating tablet 1 mg, 1 mg, Per Tube, BID, Vida Rigger, MD, 1 mg at 08/25/23 2147   dexmedetomidine (PRECEDEX) 400 MCG/100ML (4 mcg/mL) infusion, 0-1.2 mcg/kg/hr, Intravenous, Continuous, [name], RPH, Stopped at 08/25/23 1750   docusate (COLACE) 50 MG/5ML liquid 100 mg, 100 mg, Per Tube, BID PRN, Rust-Chester, Cecelia Byars, NP   docusate (COLACE) 50 MG/5ML liquid 100 mg, 100 mg, Per Tube, BID, Harlon Ditty D, NP, 100 mg at 08/25/23 2147   enoxaparin (LOVENOX) injection 40 mg, 40 mg, Subcutaneous, Q24H, Tressie Ellis, RPH, 40 mg at 08/25/23 0935   feeding supplement (OSMOLITE 1.5 CAL) liquid 1,000 mL, 1,000 mL, Per Tube, Continuous, Vida Rigger, MD, Stopped at 08/23/23 2130   feeding supplement (PROSource TF20) liquid 60 mL, 60 mL, Per Tube, Daily, Erin Fulling, MD, 60 mL at 08/25/23 1107   fentaNYL (SUBLIMAZE) bolus via infusion 50-100 mcg, 50-100 mcg, Intravenous, Q15 min PRN, Harlon Ditty D, NP, 100 mcg at 08/25/23 1819   fentaNYL in NS (74mcg/ml) infusion-PREMIX, 50-200 mcg/hr, Intravenous, Continuous, Harlon Ditty D, NP, Last Rate: 12.5 mL/hr at 08/26/23 0711, 125 mcg/hr at 08/26/23 7829   folic acid (FOLVITE) tablet 1 mg, 1 mg, Per Tube, Daily, Rust-Chester, Micheline Rough L, NP, 1 mg at 08/25/23 0900   free water 30 mL, 30 mL, Per Tube, Q4H, Erin Fulling, MD, 30 mL at 08/26/23 0419   gabapentin (NEURONTIN) 250 MG/5ML solution 800 mg, 800 mg, Per Tube, Q8H, Vida Rigger, MD, 800 mg at 08/26/23 0511   glycopyrrolate (ROBINUL) injection 0.1 mg, 0.1 mg, Intravenous, TID PRN, [name], RPH   influenza vac split trivalent PF (FLULAVAL) injection 0.5 mL, 0.5 mL, Intramuscular, Tomorrow-1000, Kasa, Kurian, MD   insulin aspart (novoLOG) injection 0-9 Units, 0-9 Units, Subcutaneous, Q4H, Rust-Chester, Britton L, NP, 2 Units at 08/24/23 1557    ipratropium-albuterol (DUONEB) 0.5-2.5 (3) MG/3ML nebulizer solution 3 mL, 3 mL, Nebulization, Q6H, Karna Christmas, Evlyn Amason, MD, 3 mL at 08/26/23 0736   LORazepam (ATIVAN) injection 1 mg, 1 mg, Intravenous, Q4H PRN, Erin Fulling, MD, 1 mg at 08/25/23 0431   midazolam (VERSED) 100 mg/100 mL (1 mg/mL) premix infusion, 0-10 mg/hr, Intravenous, Continuous, Harlon Ditty D, NP   midazolam (VERSED) bolus via infusion 0-5 mg, 0-5 mg, Intravenous, Q1H PRN, Harlon Ditty D, NP   norepinephrine (LEVOPHED) 4mg  in (0.016 mg/mL) premix infusion, 0-40 mcg/min, Intravenous, Titrated, Harlon Ditty D, NP, Last Rate: 18.75 mL/hr at 08/26/23 0700, 5 mcg/min at 08/26/23 0700   nutrition supplement (JUVEN) (JUVEN) powder packet 1 packet, 1 packet, Per Tube, BID BM, Vida Rigger, MD, 1 packet at 08/25/23 1108   ondansetron (ZOFRAN) injection 4 mg, 4 mg, Intravenous, Q6H PRN, Rust-Chester, Britton L, NP, 4 mg at 08/23/23 2210   Oral care mouth rinse, 15 mL, Mouth Rinse, 4 times per day, Vida Rigger, MD, 15 mL at 08/25/23 2157   Oral care mouth rinse, 15 mL, Mouth Rinse, PRN, Vida Rigger, MD   pantoprazole (PROTONIX) injection 40 mg, 40 mg, Intravenous, Q24H, Kasa, Kurian, MD, 40 mg at 08/25/23 1507   pneumococcal 20-valent conjugate vaccine (PREVNAR 20) injection 0.5 mL, 0.5 mL, Intramuscular, Tomorrow-1000, Kasa, Kurian, MD   polyethylene glycol (MIRALAX / GLYCOLAX) packet 17 g, 17 g, Per Tube, Daily PRN, Rust-Chester, Cecelia Byars, NP  polyethylene glycol (MIRALAX / GLYCOLAX) packet 17 g, 17 g, Per Tube, Daily, Belia Heman, Kurian, MD, 17 g at 08/25/23 1108   propofol (DIPRIVAN) 1000 MG/100ML infusion, 0-50 mcg/kg/min, Intravenous, Continuous, Harlon Ditty D, NP, Last Rate: 18.67 mL/hr at 08/26/23 0700, 35 mcg/kg/min at 08/26/23 0700   QUEtiapine (SEROQUEL) tablet 100 mg, 100 mg, Per Tube, BID, Vida Rigger, MD, 100 mg at 08/25/23 2147   Racepinephrine HCl 2.25 % nebulizer solution 0.5 mL, 0.5 mL,  Nebulization, Q4H PRN, Harlon Ditty D, NP, 0.5 mL at 08/23/23 1010   sodium chloride flush (NS) 0.9 % injection 10-40 mL, 10-40 mL, Intracatheter, Q12H, Erin Fulling, MD, 10 mL at 08/25/23 2157   sodium chloride flush (NS) 0.9 % injection 10-40 mL, 10-40 mL, Intracatheter, PRN, Erin Fulling, MD   sulfamethoxazole-trimethoprim (BACTRIM) 200-40 MG/5ML suspension 20 mL, 20 mL, Per Tube, Q12H, Vida Rigger, MD, 20 mL at 08/25/23 2147   [COMPLETED] thiamine (VITAMIN B1) 500 mg in sodium chloride 0.9 % 50 mL IVPB, 500 mg, Intravenous, Q24H, Stopped at 08/21/23 2203 **FOLLOWED BY** thiamine (VITAMIN B1) tablet 100 mg, 100 mg, Per Tube, Daily, Tressie Ellis, RPH, 100 mg at 08/25/23 0900   valproic acid (DEPAKENE) 250 MG/5ML solution 250 mg, 250 mg, Per Tube, Q8H, [name], RPH, 250 mg at 08/26/23 4098    ALLERGIES   Patient has no known allergies.    REVIEW OF SYSTEMS     On ventilator unable to obtain ROS  PHYSICAL EXAMINATION   Vital Signs: Temp:  [99 F (37.2 C)-101 F (38.3 C)] 101 F (38.3 C) (02/01 0715) Pulse Rate:  [70-89] 86 (02/01 0730) Resp:  [15-38] 18 (02/01 0730) BP: (79-143)/(50-91) 99/65 (02/01 0730) SpO2:  [89 %-100 %] 93 % (02/01 0739) FiO2 (%):  [40 %-96.6 %] 40 % (02/01 0739) Weight:  [87.5 kg] 87.5 kg (02/01 0500)  GENERAL:NAD age appropriate HEAD: Normocephalic, atraumatic.  EYES: Pupils equal, round, reactive to light.  No scleral icterus.  MOUTH: Moist mucosal membrane. NECK: Supple. No thyromegaly. No nodules. No JVD.  ETT+ heavy phlegm PULMONARY: MV sounds CARDIOVASCULAR: S1 and S2. Regular rate and rhythm. No murmurs, rubs, or gallops.  GASTROINTESTINAL: Soft, nontender, non-distended. No masses. Positive bowel sounds. No hepatosplenomegaly. Stage 1 ulcer on sacrum MUSCULOSKELETAL: No swelling, clubbing, or edema.  NEUROLOGIC:GCS 4T SKIN:intact,warm,dry   PERTINENT DATA     Infusions:  dexmedetomidine (PRECEDEX) IV infusion  Stopped (08/25/23 1750)   feeding supplement (OSMOLITE 1.5 CAL) Stopped (08/23/23 2130)   fentaNYL infusion INTRAVENOUS 125 mcg/hr (08/26/23 0711)   midazolam     norepinephrine (LEVOPHED) Adult infusion 5 mcg/min (08/26/23 0700)   propofol (DIPRIVAN) infusion 35 mcg/kg/min (08/26/23 0700)   Scheduled Medications:  Chlorhexidine Gluconate Cloth  6 each Topical Daily   clonazepam  1 mg Per Tube BID   docusate  100 mg Per Tube BID   enoxaparin (LOVENOX) injection  40 mg Subcutaneous Q24H   feeding supplement (PROSource TF20)  60 mL Per Tube Daily   folic acid  1 mg Per Tube Daily   free water  30 mL Per Tube Q4H   gabapentin  800 mg Per Tube Q8H   influenza vac split trivalent PF  0.5 mL Intramuscular Tomorrow-1000   insulin aspart  0-9 Units Subcutaneous Q4H   ipratropium-albuterol  3 mL Nebulization Q6H   nutrition supplement (JUVEN)  1 packet Per Tube BID BM   mouth rinse  15 mL Mouth Rinse 4 times per day   pantoprazole (PROTONIX)  IV  40 mg Intravenous Q24H   pneumococcal 20-valent conjugate vaccine  0.5 mL Intramuscular Tomorrow-1000   polyethylene glycol  17 g Per Tube Daily   QUEtiapine  100 mg Per Tube BID   sodium chloride flush  10-40 mL Intracatheter Q12H   sulfamethoxazole-trimethoprim  20 mL Per Tube Q12H   thiamine  100 mg Per Tube Daily   valproic acid  250 mg Per Tube Q8H   PRN Medications: acetaminophen, docusate, fentaNYL, glycopyrrolate, LORazepam, midazolam, ondansetron (ZOFRAN) IV, mouth rinse, polyethylene glycol, Racepinephrine HCl, sodium chloride flush Hemodynamic parameters:   Intake/Output: 01/31 0701 - 02/01 0700 In: 949.2 [I.V.:809.2; NG/GT:140] Out: 1555 [Urine:1455; Emesis/NG output:100]  Ventilator  Settings: Vent Mode: PRVC FiO2 (%):  [40 %-96.6 %] 40 % Set Rate:  [18 bmp] 18 bmp Vt Set:  [470 mL] 470 mL PEEP:  [5 cmH20] 5 cmH20 Plateau Pressure:  [14 cmH20] 14 cmH20   LAB RESULTS:  Basic Metabolic Panel: Recent Labs  Lab  08/21/23 1347 08/21/23 2120 08/22/23 0414 08/23/23 0435 08/23/23 2010 08/24/23 0427 08/25/23 0434 08/26/23 0433  NA  --   --  142 143  --  141 141 135  K  --    < > 3.6 3.7   < > 3.8 3.9 3.5  CL  --   --  112* 105  --  107 102 98  CO2  --   --  25 28  --  28 29 23   GLUCOSE  --   --  156* 131*  --  126* 118* 207*  BUN  --   --  9 15  --  12 16 23*  CREATININE  --   --  0.69 0.62  --  0.53 0.51 0.79  CALCIUM  --   --  8.0* 8.5*  --  8.6* 8.7* 8.0*  MG 1.7  --  2.2 2.0  --  1.8 1.8  --   PHOS 15.1*   < > 2.9 3.6  --  3.5 3.3 4.7*   < > = values in this interval not displayed.   Liver Function Tests: Recent Labs  Lab 08/22/23 0414 08/23/23 0435 08/24/23 0427 08/25/23 0434 08/26/23 0433  ALBUMIN 2.4* 2.6* 2.8* 2.7* 2.7*   No results for input(s): "LIPASE", "AMYLASE" in the last 168 hours.  No results for input(s): "AMMONIA" in the last 168 hours. CBC: Recent Labs  Lab 08/22/23 0414 08/23/23 0435 08/24/23 0427 08/25/23 0434 08/26/23 0433  WBC 6.9 6.6 9.3 11.7* 7.8  HGB 10.0* 11.2* 11.8* 12.1 11.2*  HCT 30.9* 35.5* 35.4* 37.7 35.1*  MCV 91.7 94.7 89.6 91.5 93.6  PLT 175 185 192 219 164   Cardiac Enzymes: No results for input(s): "CKTOTAL", "CKMB", "CKMBINDEX", "TROPONINI" in the last 168 hours. BNP: Invalid input(s): "POCBNP" CBG: Recent Labs  Lab 08/25/23 0733 08/25/23 0938 08/25/23 1116 08/25/23 1617 08/25/23 2005  GLUCAP 152* 133* 116* 120* 115*       IMAGING RESULTS:     ASSESSMENT AND PLAN    -Multidisciplinary rounds held today  #Acute Hypoxic Respiratory Failure secondary to suspected drug overdose of undetermined intent & suspected Aspiration #Post extubation Stridor ~ TREATED  PMHx: COPD, Asthma, OSA Reintubated again 08/26/23 -Full vent support, implement lung protective strategies -Plateau pressures less than 30 cm H20 -Wean FiO2 & PEEP as tolerated to maintain O2 sats >92% -Follow intermittent Chest X-ray & ABG as  needed -Spontaneous Breathing Trials when respiratory parameters met and mental status permits -Implement VAP Bundle -  Prn Bronchodilators -ABX -zosyn >> augmentin- staph aureus -noted thick secretions per ETT recultured +glycopyrollate    #Hypotension: Septic shock vs Sedation related Echocardiogram: 08/17/23:  LVEF 60-65%, normal diastolic parameters, RV systolic function normal -Continuous cardiac monitoring -Maintain MAP >65 -IV fluids -Vasopressors as needed to maintain MAP goal -Lactic acid is normalized -HS Troponin negative x2 -hold outpatient losartan  -IVF - LR 1L bolus   #Multifocal Pneumonia, questionable aspiration -Monitor fever curve -Trend WBC's & Procalcitonin -Follow cultures as above    #Acute Kidney Injury #Hypokalemia ~ RESOLVED #Mild NAGMA ~ RESOLVED -Monitor I&O's / urinary output -Follow BMP -Ensure adequate renal perfusion -Avoid nephrotoxic agents as able -Replace electrolytes as indicated ~ Pharmacy following for assistance with electrolyte replacement -IV fluids -Consider Renal Ultrasound   #Type 2 Diabetes Mellitus Hemoglobin A1C: 6.3 (05/2023) -CBG's q4h; Target range of 140 to 180 -SSI -Follow ICU Hypo/Hyperglycemia protocol   #Acute Encephalopathy suspect secondary to drug overdose of undetermined intent in the setting of known history of Cocaine Abuse #ETOH use with Delirium Tremens #Sedation needs in setting of mechanical ventilation #Anxiety & Depression #Bipolar Disorder UDS + cocaine, marijuana & amphetamines. Blood Alcohol level elevated at 190 -Maintain a RASS goal of 0 to -1 -Fentanyl and Propofol as needed to maintain RASS goal -Avoid sedating medications as able -Daily wake up assessment -CIWA protocol -Continue Phenobarb taper -High dose thiamine x3 days followed by 100 mg daily -Folic acid and MVI -Hold outpatient regimen: Seroquel, effexor xr, vraylar. Consider restarting as patient stabilizes -once stabilized will  need to screen for suicidal ideation         Best Practice (right click and "Reselect all SmartList Selections" daily)  Diet/type: NPO w/ meds via tube DVT prophylaxis LMWH Pressure ulcer(s): N/A GI prophylaxis: H2B Lines: N/A Foley:  Yes, and it is still needed Code Status:  full code Last date of multidisciplinary goals of care discussion [1/26]    ID -continue IV abx as prescibed -follow up cultures  GI/Nutrition GI PROPHYLAXIS as indicated DIET-->TF's as tolerated Constipation protocol as indicated  ENDO - ICU hypoglycemic\Hyperglycemia protocol -check FSBS per protocol   ELECTROLYTES -follow labs as needed -replace as needed -pharmacy consultation   DVT/GI PRX ordered -SCDs  TRANSFUSIONS AS NEEDED MONITOR FSBS ASSESS the need for LABS as needed    Critical care provider statement:   Total critical care time: 33 minutes   Performed by: Karna Christmas MD   Critical care time was exclusive of separately billable procedures and treating other patients.   Critical care was necessary to treat or prevent imminent or life-threatening deterioration.   Critical care was time spent personally by me on the following activities: development of treatment plan with patient and/or surrogate as well as nursing, discussions with consultants, evaluation of patient's response to treatment, examination of patient, obtaining history from patient or surrogate, ordering and performing treatments and interventions, ordering and review of laboratory studies, ordering and review of radiographic studies, pulse oximetry and re-evaluation of patient's condition.    Vida Rigger, M.D.  Pulmonary & Critical Care Medicine

## 2023-08-26 NOTE — Consult Note (Signed)
PHARMACY CONSULT NOTE - ELECTROLYTES  Pharmacy Consult for Electrolyte Monitoring and Replacement   Recent Labs: Potassium (mmol/L)  Date Value  08/26/2023 3.5  10/15/2012 3.0 (L)   Magnesium (mg/dL)  Date Value  16/04/9603 1.8  10/04/2011 1.4 (L)   Calcium (mg/dL)  Date Value  54/03/8118 8.0 (L)   Calcium, Total (mg/dL)  Date Value  14/78/2956 8.3 (L)   Albumin (g/dL)  Date Value  21/30/8657 2.7 (L)  06/13/2023 4.3  10/15/2012 3.9   Phosphorus (mg/dL)  Date Value  84/69/6295 4.7 (H)   Sodium (mmol/L)  Date Value  08/26/2023 135  06/13/2023 142  10/15/2012 141   Height: 5\' 6"  (167.6 cm) Weight: 87.5 kg (192 lb 14.4 oz) IBW/kg (Calculated) : 59.3 Estimated Creatinine Clearance: 89.6 mL/min (by C-G formula based on SCr of 0.79 mg/dL).  Assessment  Andrea Sanford is a 53 y.o. female presenting with drug overdose / intoxication. PMH significant for polysubstance abuse, anxiety / depression, asthma / COPD, bipolar disorder, DM, neuromuscular disorder. Pharmacy has been consulted to monitor and replace electrolytes.  Goal of Therapy: Electrolytes within normal limits  Plan:  K 3.5, Kcl 40 mEq per tube x 1 dose Electrolytes with labs tomorrow AM  Thank you for allowing pharmacy to be a part of this patient's care.  Tressie Ellis 08/26/2023 12:05 PM

## 2023-08-26 NOTE — Plan of Care (Signed)
  Problem: Education: Goal: Ability to describe self-care measures that may prevent or decrease complications (Diabetes Survival Skills Education) will improve Outcome: Not Progressing   Problem: Coping: Goal: Ability to adjust to condition or change in health will improve Outcome: Not Progressing   Problem: Fluid Volume: Goal: Ability to maintain a balanced intake and output will improve Outcome: Not Progressing   Problem: Health Behavior/Discharge Planning: Goal: Ability to identify and utilize available resources and services will improve Outcome: Not Progressing Goal: Ability to manage health-related needs will improve Outcome: Not Progressing   Problem: Metabolic: Goal: Ability to maintain appropriate glucose levels will improve Outcome: Not Progressing   Problem: Nutritional: Goal: Maintenance of adequate nutrition will improve Outcome: Not Progressing Goal: Progress toward achieving an optimal weight will improve Outcome: Not Progressing   Problem: Skin Integrity: Goal: Risk for impaired skin integrity will decrease Outcome: Not Progressing   Problem: Tissue Perfusion: Goal: Adequacy of tissue perfusion will improve Outcome: Not Progressing   Problem: Education: Goal: Knowledge of General Education information will improve Description: Including pain rating scale, medication(s)/side effects and non-pharmacologic comfort measures Outcome: Not Progressing   Problem: Health Behavior/Discharge Planning: Goal: Ability to manage health-related needs will improve Outcome: Not Progressing   Problem: Clinical Measurements: Goal: Ability to maintain clinical measurements within normal limits will improve Outcome: Not Progressing Goal: Will remain free from infection Outcome: Not Progressing Goal: Diagnostic test results will improve Outcome: Not Progressing Goal: Respiratory complications will improve Outcome: Not Progressing Goal: Cardiovascular complication will  be avoided Outcome: Not Progressing   Problem: Activity: Goal: Risk for activity intolerance will decrease Outcome: Not Progressing   Problem: Nutrition: Goal: Adequate nutrition will be maintained Outcome: Not Progressing   Problem: Coping: Goal: Level of anxiety will decrease Outcome: Not Progressing   Problem: Elimination: Goal: Will not experience complications related to bowel motility Outcome: Not Progressing Goal: Will not experience complications related to urinary retention Outcome: Not Progressing   Problem: Pain Managment: Goal: General experience of comfort will improve and/or be controlled Outcome: Not Progressing   Problem: Safety: Goal: Ability to remain free from injury will improve Outcome: Not Progressing   Problem: Skin Integrity: Goal: Risk for impaired skin integrity will decrease Outcome: Not Progressing   Problem: Education: Goal: Knowledge of disease or condition will improve Outcome: Not Progressing Goal: Knowledge of the prescribed therapeutic regimen will improve Outcome: Not Progressing   Problem: Activity: Goal: Ability to tolerate increased activity will improve Outcome: Not Progressing Goal: Will verbalize the importance of balancing activity with adequate rest periods Outcome: Not Progressing   Problem: Respiratory: Goal: Ability to maintain a clear airway will improve Outcome: Not Progressing Goal: Levels of oxygenation will improve Outcome: Not Progressing Goal: Ability to maintain adequate ventilation will improve Outcome: Not Progressing   Problem: Activity: Goal: Ability to tolerate increased activity will improve Outcome: Not Progressing   Problem: Respiratory: Goal: Ability to maintain a clear airway and adequate ventilation will improve Outcome: Not Progressing   Problem: Role Relationship: Goal: Method of communication will improve Outcome: Not Progressing

## 2023-08-26 NOTE — Plan of Care (Signed)
Remains sedated and intubated. Tolerating vent settings. Currently off norepinephrine with MAP 68. Tube feeds restarted today, (25 cc per hour and increase by 10 cc q 8 hours) via NGT; plan to titrate up to goal 60 cc per hour. Sliding scale insulin given as ordered. NSR in 80's. Foley remains in place; no BM's today. UOP 700 cc this shift. Fever this am to 101 (38.3). Tylenol given per Dr. Karna Christmas.   Problem: Coping: Goal: Ability to adjust to condition or change in health will improve Outcome: Not Progressing   Problem: Fluid Volume: Goal: Ability to maintain a balanced intake and output will improve Outcome: Progressing   Problem: Health Behavior/Discharge Planning: Goal: Ability to identify and utilize available resources and services will improve Outcome: Not Progressing Goal: Ability to manage health-related needs will improve Outcome: Not Progressing   Problem: Metabolic: Goal: Ability to maintain appropriate glucose levels will improve Outcome: Progressing   Problem: Nutritional: Goal: Maintenance of adequate nutrition will improve Outcome: Progressing Goal: Progress toward achieving an optimal weight will improve Outcome: Progressing   Problem: Skin Integrity: Goal: Risk for impaired skin integrity will decrease Outcome: Progressing   Problem: Tissue Perfusion: Goal: Adequacy of tissue perfusion will improve Outcome: Progressing   Problem: Clinical Measurements: Goal: Ability to maintain clinical measurements within normal limits will improve Outcome: Progressing Goal: Will remain free from infection Outcome: Progressing Goal: Diagnostic test results will improve Outcome: Progressing Goal: Respiratory complications will improve Outcome: Progressing Goal: Cardiovascular complication will be avoided Outcome: Progressing   Problem: Activity: Goal: Risk for activity intolerance will decrease Outcome: Progressing   Problem: Nutrition: Goal: Adequate nutrition  will be maintained Outcome: Progressing   Problem: Coping: Goal: Level of anxiety will decrease Outcome: Progressing   Problem: Elimination: Goal: Will not experience complications related to bowel motility Outcome: Progressing Goal: Will not experience complications related to urinary retention Outcome: Progressing   Problem: Pain Managment: Goal: General experience of comfort will improve and/or be controlled Outcome: Progressing   Problem: Safety: Goal: Ability to remain free from injury will improve Outcome: Progressing   Problem: Skin Integrity: Goal: Risk for impaired skin integrity will decrease Outcome: Progressing   Problem: Respiratory: Goal: Ability to maintain a clear airway will improve Outcome: Progressing Goal: Levels of oxygenation will improve Outcome: Progressing Goal: Ability to maintain adequate ventilation will improve Outcome: Progressing   Problem: Activity: Goal: Ability to tolerate increased activity will improve Outcome: Progressing   Problem: Respiratory: Goal: Ability to maintain a clear airway and adequate ventilation will improve Outcome: Progressing

## 2023-08-27 LAB — CBC
HCT: 35.4 % — ABNORMAL LOW (ref 36.0–46.0)
Hemoglobin: 11.3 g/dL — ABNORMAL LOW (ref 12.0–15.0)
MCH: 29.7 pg (ref 26.0–34.0)
MCHC: 31.9 g/dL (ref 30.0–36.0)
MCV: 93.2 fL (ref 80.0–100.0)
Platelets: 217 10*3/uL (ref 150–400)
RBC: 3.8 MIL/uL — ABNORMAL LOW (ref 3.87–5.11)
RDW: 12.7 % (ref 11.5–15.5)
WBC: 6.4 10*3/uL (ref 4.0–10.5)
nRBC: 0 % (ref 0.0–0.2)

## 2023-08-27 LAB — RENAL FUNCTION PANEL
Albumin: 2.6 g/dL — ABNORMAL LOW (ref 3.5–5.0)
Anion gap: 10 (ref 5–15)
BUN: 35 mg/dL — ABNORMAL HIGH (ref 6–20)
CO2: 26 mmol/L (ref 22–32)
Calcium: 8.7 mg/dL — ABNORMAL LOW (ref 8.9–10.3)
Chloride: 103 mmol/L (ref 98–111)
Creatinine, Ser: 0.9 mg/dL (ref 0.44–1.00)
GFR, Estimated: >60 mL/min (ref >60)
Glucose, Bld: 154 mg/dL — ABNORMAL HIGH (ref 70–99)
Phosphorus: 3.2 mg/dL (ref 2.5–4.6)
Potassium: 4.3 mmol/L (ref 3.5–5.1)
Sodium: 139 mmol/L (ref 135–145)

## 2023-08-27 LAB — GLUCOSE, CAPILLARY
Glucose-Capillary: 133 mg/dL — ABNORMAL HIGH (ref 70–99)
Glucose-Capillary: 154 mg/dL — ABNORMAL HIGH (ref 70–99)
Glucose-Capillary: 179 mg/dL — ABNORMAL HIGH (ref 70–99)
Glucose-Capillary: 96 mg/dL (ref 70–99)

## 2023-08-27 LAB — MAGNESIUM: Magnesium: 2.4 mg/dL (ref 1.7–2.4)

## 2023-08-27 LAB — TRIGLYCERIDES: Triglycerides: 400 mg/dL — ABNORMAL HIGH (ref <150)

## 2023-08-27 MED ORDER — MIDAZOLAM-SODIUM CHLORIDE 100-0.9 MG/100ML-% IV SOLN
0.0000 mg/h | INTRAVENOUS | Status: DC
  Administered 2023-08-27: 2 mg/h via INTRAVENOUS
  Administered 2023-08-28: 5 mg/h via INTRAVENOUS
  Filled 2023-08-27 (×2): qty 100

## 2023-08-27 MED ORDER — VENLAFAXINE HCL 37.5 MG PO TABS
75.0000 mg | ORAL_TABLET | Freq: Two times a day (BID) | ORAL | Status: DC
  Administered 2023-08-27 – 2023-09-15 (×38): 75 mg
  Filled 2023-08-27 (×40): qty 2

## 2023-08-27 MED ORDER — MIDAZOLAM BOLUS VIA INFUSION
0.0000 mg | INTRAVENOUS | Status: DC | PRN
  Administered 2023-08-27 – 2023-08-28 (×9): 2 mg via INTRAVENOUS

## 2023-08-27 NOTE — Consult Note (Signed)
PHARMACY CONSULT NOTE - ELECTROLYTES  Pharmacy Consult for Electrolyte Monitoring and Replacement   Recent Labs: Potassium (mmol/L)  Date Value  08/27/2023 4.3  10/15/2012 3.0 (L)   Magnesium (mg/dL)  Date Value  16/04/9603 2.4  10/04/2011 1.4 (L)   Calcium (mg/dL)  Date Value  54/03/8118 8.7 (L)   Calcium, Total (mg/dL)  Date Value  14/78/2956 8.3 (L)   Albumin (g/dL)  Date Value  21/30/8657 2.6 (L)  06/13/2023 4.3  10/15/2012 3.9   Phosphorus (mg/dL)  Date Value  84/69/6295 3.2   Sodium (mmol/L)  Date Value  08/27/2023 139  06/13/2023 142  10/15/2012 141   Height: 5\' 6"  (167.6 cm) Weight: 89.8 kg (197 lb 15.6 oz) IBW/kg (Calculated) : 59.3 Estimated Creatinine Clearance: 80.7 mL/min (by C-G formula based on SCr of 0.9 mg/dL).  Assessment  Andrea Sanford is a 53 y.o. female presenting with drug overdose / intoxication. PMH significant for polysubstance abuse, anxiety / depression, asthma / COPD, bipolar disorder, DM, neuromuscular disorder. Pharmacy has been consulted to monitor and replace electrolytes.  Free water 30 ml q4H.   Goal of Therapy: Electrolytes within normal limits  Plan:  No replacement needed F/u with AM labs.  Thank you for allowing pharmacy to be a part of this patient's care.  Ronnald Ramp 08/27/2023 8:35 AM

## 2023-08-27 NOTE — Plan of Care (Signed)
  Problem: Coping: Goal: Ability to adjust to condition or change in health will improve Outcome: Progressing   Problem: Metabolic: Goal: Ability to maintain appropriate glucose levels will improve Outcome: Progressing   Problem: Nutritional: Goal: Maintenance of adequate nutrition will improve Outcome: Progressing   Problem: Clinical Measurements: Goal: Will remain free from infection Outcome: Progressing Goal: Diagnostic test results will improve Outcome: Progressing   Problem: Nutrition: Goal: Adequate nutrition will be maintained Outcome: Progressing   Problem: Elimination: Goal: Will not experience complications related to urinary retention Outcome: Progressing   Problem: Pain Managment: Goal: General experience of comfort will improve and/or be controlled Outcome: Progressing   Problem: Safety: Goal: Ability to remain free from injury will improve Outcome: Progressing

## 2023-08-27 NOTE — Progress Notes (Signed)
CRITICAL CARE PROGRESS NOTE    Name: Andrea Sanford MRN: [id number] DOB: Sep 15, 1970     LOS: 10   SUBJECTIVE FINDINGS & SIGNIFICANT EVENTS    History of Presenting Illness:  -patient with polysubstance abuse including drugs and alcohol.  She was found unresponsive.  She has PMH of psychiatric conditions, lifelong smoking, homelesness, COPD, anxiety and depression.    ED course: Upon arrival patient obtunded, tachypneic, hypothermic and hypertensive being supported with a BVM. Patient emergently intubated for airway protection in the setting of suspected drug overdose, becoming hypotensive after induction requiring vasopressor support. Labs significant for hypokalemia and mild NAGMA.   Imaging revealed vascular congestion on CXR with no acute abnormality on CTH.  Medications given: etomidate & succinylcholine, 3 L bolus, levophed drip started Initial Vitals: 95.1, 22, 95, 168/138, 90% BVM Significant labs: (Labs/ Imaging personally reviewed) I, [name], AGACNP-BC, personally viewed and interpreted this ECG. EKG Interpretation: Date: 08/17/23, EKG Time: 04:45, Rate: 77, Rhythm: NSR, QRS Axis:  normal, Intervals: borderline prolonged Qtc, ST/T Wave abnormalities: very mild STE in leads I, II and avL (does not meet STEMI criteria), Narrative Interpretation: NSR Chemistry: Na+:138, K+: 3.1, BUN/Cr.: 11/0.84, Serum CO2/ AG: 20/ 11 Hematology: WBC: 9.8, Hgb: 13.6,  Troponin: 6, BNP: pending, Lactic/ PCT: 0.9/pending,  COVID-19 & Influenza A/B: pending   Alcohol level: 190, Acetaminophen: <10, Salicylate: <7 UDS: +amphetamines, marijuana, cocaine   ABG: 7.27/49/173/22.5 CXR 08/17/23:  Low volume film with vascular congestion. Large gastric bubble despite the presence of an NG tube. CT head wo  contrast 08/17/23: No acute intracranial abnormality identified. Mild but progressed cerebral white matter changes since 2019, most commonly due to small vessel disease. Chronic left facial fractures.   08/21/23- patient with heavy dark inspissated ETT secretions with bronchospasm. RT worried about increased resistance on MV due to thick resp mucus. We plan to perform bronchoscopy I reviewed medical findings with [place] mother of patient .  08/22/23- patient failed SBT today, failed with AMS and hypoxemia 08/25/23-patient is awake but aggitated and requiring sitter due to concerns for fall out of bed.  High risk for withdrawal from drug/alcohol abuse.   Trach aspirate with staph aureus.  She had vomit overnight.  Patient has increased O2 requirement and has been advanced to Heated HFNC.  08/26/23- patient progressively got worse with severe hypoxemia and severe aggitation required reintubation again. She may need tracheostomy for prolonged weaning from MV.  08/27/23- patient with hypertriglyceridemia dcd prop started versed drip per pharmacy recs. Fentanyl gtt for anaglesia. On low dose levophed weaning.  Secretions moderate on PRVC with 35% FiO2.  Will need to consider tracheostomy.   Lines/tubes : Airway 8 mm (Active)  Secured at (cm) 22 cm 08/21/23 0826  Measured From Lips 08/21/23 0826  Secured Location Left 08/21/23 0826  Secured By [employer] 08/21/23 0826  Bite Block No 08/21/23 0826  Tube Holder Repositioned Yes 08/21/23 0826  Prone position No 08/21/23 0826  Cuff Pressure (cm H2O) Green OR 18-26 [facility] 08/21/23 0826  Site Condition Dry 08/21/23 0826     PICC Triple Lumen 08/20/23 Right Basilic 39 cm 0 cm (Active)  Indication for Insertion or Continuance of Line Vasoactive infusions 08/21/23 0733  Exposed Catheter (cm) 0 cm 08/21/23 0733  Site Assessment Clean, Dry, Intact 08/21/23 0733  Lumen #1 Status In-line blood sampling system in place 08/21/23 0733  Lumen #2 Status  Flushed;Infusing;Blood return noted 08/21/23 0733  Lumen #3 Status Infusing;Flushed;Blood return noted  08/21/23 0733  Dressing Type Transparent;Securing device 08/21/23 0733  Dressing Status Antimicrobial disc/dressing in place 08/21/23 1610  Line Care Connections checked and tightened 08/21/23 9604  Line Adjustment (NICU/IV Team Only) No 08/20/23 1029  Dressing Intervention New dressing;Adhesive placed at insertion site (IV team only);Adhesive placed around edges of dressing (IV team/ICU RN only) 08/20/23 1029  Dressing Change Due 08/27/23 08/21/23 0733     NG/OG Vented/Dual Lumen 14 Fr. Right nare Marking at nare/corner of mouth 70 cm (Active)  Tube Position (Required) Marking at nare/corner of mouth 08/21/23 0736  Measurement (cm) (Required) 70 cm 08/21/23 0736  Ongoing Placement Verification (Required) (See row information) Yes 08/21/23 0736  Site Assessment Clean, Dry, Intact 08/21/23 0736  Interventions Clamped 08/21/23 0736  Status Clamped 08/21/23 0736  Intake (mL) 30 mL 08/21/23 0736  Output (mL) 0 mL 08/21/23 0600     Urethral Catheter C. Vazquez Non-latex 14 Fr. (Active)  Indication for Insertion or Continuance of Catheter Acute urinary retention (I&O Cath for 24 hrs prior to catheter insertion- Inpatient Only) 08/21/23 0736  Site Assessment Clean, Dry, Intact 08/21/23 0736  Catheter Maintenance Bag below level of bladder;Catheter secured;Drainage bag/tubing not touching floor;Insertion date on drainage bag;No dependent loops;Seal intact;Bag emptied prior to transport 08/21/23 0736  Collection Container Standard drainage bag 08/21/23 0736  Securement Method Adhesive securement device 08/21/23 0736  Output (mL) 70 mL 08/21/23 0600    Microbiology/Sepsis markers: Results for orders placed or performed during the hospital encounter of 08/17/23  Culture, blood (routine x 2)     Status: None   Collection Time: 08/17/23  4:52 AM   Specimen: BLOOD  Result Value Ref Range Status    Specimen Description BLOOD LEFT ANTECUBITAL  Final   Special Requests   Final    BOTTLES DRAWN AEROBIC AND ANAEROBIC Blood Culture results may not be optimal due to an excessive volume of blood received in culture bottles   Culture   Final    NO GROWTH 5 DAYS Performed at [facility], [address], [place], [place] [address]    Report Status 08/22/2023 FINAL  Final  Culture, blood (routine x 2)     Status: None   Collection Time: 08/17/23  4:52 AM   Specimen: BLOOD  Result Value Ref Range Status   Specimen Description BLOOD BLOOD LEFT HAND  Final   Special Requests   Final    BOTTLES DRAWN AEROBIC AND ANAEROBIC Blood Culture adequate volume   Culture   Final    NO GROWTH 5 DAYS Performed at [facility], [address] [place], [place] [address]    Report Status 08/22/2023 FINAL  Final  Resp panel by RT-PCR (RSV, Flu A&B, Covid) Anterior Nasal Swab     Status: None   Collection Time: 08/17/23  5:11 AM   Specimen: Anterior Nasal Swab  Result Value Ref Range Status   SARS Coronavirus 2 by RT PCR NEGATIVE NEGATIVE Final    Comment: (NOTE) SARS-CoV-2 target nucleic acids are NOT DETECTED.  The SARS-CoV-2 RNA is generally detectable in upper respiratory specimens during the acute phase of infection. The lowest concentration of SARS-CoV-2 viral copies this assay can detect is 138 copies/mL. A negative result does not preclude SARS-Cov-2 infection and should not be used as the sole basis for treatment or other patient management decisions. A negative result may occur with  improper specimen collection/handling, submission of specimen other than nasopharyngeal swab, presence of viral mutation(s) within the areas targeted by this  assay, and inadequate number of viral copies(<138 copies/mL). A negative result must be combined with clinical observations, patient history, and epidemiological information. The expected result is Negative.  Fact Sheet for  Patients:  BloggerCourse.com  Fact Sheet for Healthcare Providers:  SeriousBroker.it  This test is no t yet approved or cleared by the [place] FDA and  has been authorized for detection and/or diagnosis of SARS-CoV-2 by FDA under an Emergency Use Authorization (EUA). This EUA will remain  in effect (meaning this test can be used) for the duration of the COVID-19 declaration under Section 564(b)(1) of the Act, 21 U.S.C.section 360bbb-3(b)(1), unless the authorization is terminated  or revoked sooner.       Influenza A by PCR NEGATIVE NEGATIVE Final   Influenza B by PCR NEGATIVE NEGATIVE Final    Comment: (NOTE) The Xpert Xpress SARS-CoV-2/FLU/RSV plus assay is intended as an aid in the diagnosis of influenza from Nasopharyngeal swab specimens and should not be used as a sole basis for treatment. Nasal washings and aspirates are unacceptable for Xpert Xpress SARS-CoV-2/FLU/RSV testing.  Fact Sheet for Patients: BloggerCourse.com  Fact Sheet for Healthcare Providers: SeriousBroker.it  This test is not yet approved or cleared by the [place] FDA and has been authorized for detection and/or diagnosis of SARS-CoV-2 by FDA under an Emergency Use Authorization (EUA). This EUA will remain in effect (meaning this test can be used) for the duration of the COVID-19 declaration under Section 564(b)(1) of the Act, 21 U.S.C. section 360bbb-3(b)(1), unless the authorization is terminated or revoked.     Resp Syncytial Virus by PCR NEGATIVE NEGATIVE Final    Comment: (NOTE) Fact Sheet for Patients: BloggerCourse.com  Fact Sheet for Healthcare Providers: SeriousBroker.it  This test is not yet approved or cleared by the [place] FDA and has been authorized for detection and/or diagnosis of SARS-CoV-2 by FDA under an Emergency  Use Authorization (EUA). This EUA will remain in effect (meaning this test can be used) for the duration of the COVID-19 declaration under Section 564(b)(1) of the Act, 21 U.S.C. section 360bbb-3(b)(1), unless the authorization is terminated or revoked.  Performed at [facility], [address] [place], [place] [address]   MRSA Next Gen by PCR, Nasal     Status: None   Collection Time: 08/17/23  6:00 AM   Specimen: Nasal Mucosa; Nasal Swab  Result Value Ref Range Status   MRSA by PCR Next Gen NOT DETECTED NOT DETECTED Final    Comment: (NOTE) The GeneXpert MRSA Assay (FDA approved for NASAL specimens only), is one component of a comprehensive MRSA colonization surveillance program. It is not intended to diagnose MRSA infection nor to guide or monitor treatment for MRSA infections. Test performance is not FDA approved in patients less than 78 years old. Performed at [facility], [address] [place], [place] [address]   Culture, blood (Routine X 2) w Reflex to ID Panel     Status: None   Collection Time: 08/19/23  4:23 PM   Specimen: BLOOD  Result Value Ref Range Status   Specimen Description BLOOD BLOOD RIGHT ARM AEROBIC BOTTLE ONLY  Final   Special Requests   Final    BOTTLES DRAWN AEROBIC ONLY Blood Culture results may not be optimal due to an inadequate volume of blood received in culture bottles   Culture   Final    NO GROWTH 5 DAYS Performed at [facility], [address], [place], [place] [address]    Report  Status 08/24/2023 FINAL  Final  Culture, blood (Routine X 2) w Reflex to ID Panel     Status: None   Collection Time: 08/19/23  4:23 PM   Specimen: BLOOD  Result Value Ref Range Status   Specimen Description BLOOD BLOOD RIGHT HAND AEROBIC BOTTLE ONLY  Final   Special Requests   Final    BOTTLES DRAWN AEROBIC ONLY Blood Culture results may not be optimal due to an inadequate volume of blood received in culture bottles   Culture    Final    NO GROWTH 5 DAYS Performed at [facility], [address] [place], [place] [address]    Report Status 08/24/2023 FINAL  Final  Respiratory (~20 pathogens) panel by PCR     Status: None   Collection Time: 08/20/23 11:04 AM   Specimen: Nasopharyngeal Swab; Respiratory  Result Value Ref Range Status   Adenovirus NOT DETECTED NOT DETECTED Final   Coronavirus 229E NOT DETECTED NOT DETECTED Final    Comment: (NOTE) The Coronavirus on the Respiratory Panel, DOES NOT test for the novel  Coronavirus (2019 nCoV)    Coronavirus HKU1 NOT DETECTED NOT DETECTED Final   Coronavirus NL63 NOT DETECTED NOT DETECTED Final   Coronavirus OC43 NOT DETECTED NOT DETECTED Final   Metapneumovirus NOT DETECTED NOT DETECTED Final   Rhinovirus / Enterovirus NOT DETECTED NOT DETECTED Final   Influenza A NOT DETECTED NOT DETECTED Final   Influenza B NOT DETECTED NOT DETECTED Final   Parainfluenza Virus 1 NOT DETECTED NOT DETECTED Final   Parainfluenza Virus 2 NOT DETECTED NOT DETECTED Final   Parainfluenza Virus 3 NOT DETECTED NOT DETECTED Final   Parainfluenza Virus 4 NOT DETECTED NOT DETECTED Final   Respiratory Syncytial Virus NOT DETECTED NOT DETECTED Final   Bordetella pertussis NOT DETECTED NOT DETECTED Final   Bordetella Parapertussis NOT DETECTED NOT DETECTED Final   Chlamydophila pneumoniae NOT DETECTED NOT DETECTED Final   Mycoplasma pneumoniae NOT DETECTED NOT DETECTED Final    Comment: Performed at [facility], [address], [place], [place] [address]  Culture, Respiratory w Gram Stain     Status: None   Collection Time: 08/20/23  1:56 PM   Specimen: Tracheal Aspirate; Respiratory  Result Value Ref Range Status   Specimen Description   Final    TRACHEAL ASPIRATE Performed at [facility], [address] [place], [place] [address]    Special Requests   Final    NONE Performed at [facility] [address] [place], [place] [address]    Gram  Stain   Final    FEW WBC PRESENT, PREDOMINANTLY PMN RARE GRAM POSITIVE COCCI Performed at [facility], [address] [place], [place] [address]    Culture FEW STAPHYLOCOCCUS AUREUS  Final   Report Status 08/24/2023 FINAL  Final   Organism ID, Bacteria STAPHYLOCOCCUS AUREUS  Final      Susceptibility   Staphylococcus aureus - MIC*    CIPROFLOXACIN <=0.5 SENSITIVE Sensitive     ERYTHROMYCIN >=8 RESISTANT Resistant     GENTAMICIN <=0.5 SENSITIVE Sensitive     OXACILLIN RESISTANT Resistant     TETRACYCLINE <=1 SENSITIVE Sensitive     VANCOMYCIN <=0.5 SENSITIVE Sensitive     TRIMETH/SULFA <=10 SENSITIVE Sensitive     CLINDAMYCIN <=0.25 SENSITIVE Sensitive     RIFAMPIN <=0.5 SENSITIVE Sensitive     Inducible Clindamycin NEGATIVE Sensitive     LINEZOLID 2 SENSITIVE Sensitive     *  FEW STAPHYLOCOCCUS AUREUS  Culture, BAL-quantitative w Gram Stain     Status: Abnormal   Collection Time: 08/21/23  1:05 PM   Specimen: Bronchoalveolar Lavage; Respiratory  Result Value Ref Range Status   Specimen Description   Final    BRONCHIAL ALVEOLAR LAVAGE Performed at [facility], [address] [place], [place] [address]    Special Requests   Final    NONE Performed at [facility], [address] [place], [place] [address]    Gram Stain   Final    ABUNDANT WBC PRESENT, PREDOMINANTLY PMN NO ORGANISMS SEEN Performed at [facility], [address], [place], [place] [address]    Culture 10,000 COLONIES/mL STAPHYLOCOCCUS AUREUS (A)  Final   Report Status 08/24/2023 FINAL  Final   Organism ID, Bacteria STAPHYLOCOCCUS AUREUS (A)  Final      Susceptibility   Staphylococcus aureus - MIC*    CIPROFLOXACIN <=0.5 SENSITIVE Sensitive     ERYTHROMYCIN >=8 RESISTANT Resistant     GENTAMICIN <=0.5 SENSITIVE Sensitive     OXACILLIN 0.5 SENSITIVE Sensitive     TETRACYCLINE <=1 SENSITIVE Sensitive     VANCOMYCIN 1 SENSITIVE Sensitive     TRIMETH/SULFA <=10 SENSITIVE  Sensitive     CLINDAMYCIN <=0.25 SENSITIVE Sensitive     RIFAMPIN <=0.5 SENSITIVE Sensitive     Inducible Clindamycin NEGATIVE Sensitive     LINEZOLID 2 SENSITIVE Sensitive     * 10,000 COLONIES/mL STAPHYLOCOCCUS AUREUS    Anti-infectives:  Anti-infectives (From admission, onward)    Start     Dose/Rate Route Frequency Ordered Stop   08/23/23 1800  sulfamethoxazole-trimethoprim (BACTRIM) 200-40 MG/5ML suspension 20 mL        20 mL Per Tube Every 12 hours 08/23/23 1507 08/30/23 2159   08/21/23 1800  amoxicillin-clavulanate (AUGMENTIN) 400-57 MG/5ML suspension 875 mg  Status:  Discontinued        875 mg Per Tube Every 12 hours 08/21/23 1148 08/23/23 1457   08/19/23 1730  piperacillin-tazobactam (ZOSYN) IVPB 3.375 g  Status:  Discontinued        3.375 g 12.5 mL/hr over 240 Minutes Intravenous Every 8 hours 08/19/23 1633 08/21/23 1146         PAST MEDICAL HISTORY   Past Medical History:  Diagnosis Date   Allergy    Anxiety    Arthritis    Asthma    Bipolar 1 disorder (HCC)    Cancer (HCC)    COPD (chronic obstructive pulmonary disease) (HCC)    Depression    Diabetes mellitus without complication (HCC)    Hypertension    Neuromuscular disorder (HCC)    Osteoporosis    Sleep apnea      SURGICAL HISTORY   Past Surgical History:  Procedure Laterality Date   ANKLE SURGERY Right    PERIPHERAL VASCULAR THROMBECTOMY Left 1991     FAMILY HISTORY   Family History  Problem Relation Age of Onset   Depression Mother    Diabetes Mother    Cancer Father      SOCIAL HISTORY   Social History   Tobacco Use   Smoking status: Every Day    Current packs/day: 1.00    Types: Cigarettes   Smokeless tobacco: Never  Substance Use Topics   Alcohol use: Not Currently    Alcohol/week: 2.0 standard drinks of alcohol    Types: 2 Standard drinks or equivalent per week   Drug use: Yes    Types: Cocaine  Comment: uses on occasions     MEDICATIONS   Current  Medication:  Current Facility-Administered Medications:    acetaminophen (TYLENOL) tablet 650 mg, 650 mg, Per Tube, Q4H PRN, Rust-Chester, Micheline Rough L, NP, 650 mg at 08/26/23 1610   Chlorhexidine Gluconate Cloth 2 % PADS 6 each, 6 each, Topical, Daily, Erin Fulling, MD, 6 each at 08/26/23 0911   clonazepam (KLONOPIN) disintegrating tablet 1 mg, 1 mg, Per Tube, BID, Vida Rigger, MD, 1 mg at 08/27/23 0853   docusate (COLACE) 50 MG/5ML liquid 100 mg, 100 mg, Per Tube, BID PRN, Rust-Chester, Cecelia Byars, NP   docusate (COLACE) 50 MG/5ML liquid 100 mg, 100 mg, Per Tube, BID, Harlon Ditty D, NP, 100 mg at 08/26/23 1029   enoxaparin (LOVENOX) injection 40 mg, 40 mg, Subcutaneous, Q24H, Tressie Ellis, RPH, 40 mg at 08/27/23 9604   feeding supplement (OSMOLITE 1.5 CAL) liquid 1,000 mL, 1,000 mL, Per Tube, Continuous, Vida Rigger, MD, Last Rate: 45 mL/hr at 08/27/23 1000, Infusion Verify at 08/27/23 1000   feeding supplement (PROSource TF20) liquid 60 mL, 60 mL, Per Tube, Daily, Belia Heman, Wallis Bamberg, MD, 60 mL at 08/27/23 0901   fentaNYL (SUBLIMAZE) bolus via infusion 50-100 mcg, 50-100 mcg, Intravenous, Q15 min PRN, Harlon Ditty D, NP, 100 mcg at 08/27/23 0916   fentaNYL in NS (50mcg/ml) infusion-PREMIX, 50-200 mcg/hr, Intravenous, Continuous, Harlon Ditty D, NP, Last Rate: 12.5 mL/hr at 08/27/23 1000, 125 mcg/hr at 08/27/23 1000   folic acid (FOLVITE) tablet 1 mg, 1 mg, Per Tube, Daily, Rust-Chester, Micheline Rough L, NP, 1 mg at 08/27/23 0858   free water 30 mL, 30 mL, Per Tube, Q4H, Kasa, Wallis Bamberg, MD, 30 mL at 08/27/23 0800   gabapentin (NEURONTIN) 250 MG/5ML solution 800 mg, 800 mg, Per Tube, Q8H, Vida Rigger, MD, 800 mg at 08/27/23 0616   glycopyrrolate (ROBINUL) injection 0.1 mg, 0.1 mg, Intravenous, TID PRN, [name], RPH   insulin aspart (novoLOG) injection 0-9 Units, 0-9 Units, Subcutaneous, Q4H, Rust-Chester, Britton L, NP, 1 Units at 08/27/23 0910   ipratropium-albuterol  (DUONEB) 0.5-2.5 (3) MG/3ML nebulizer solution 3 mL, 3 mL, Nebulization, Q6H PRN, Vida Rigger, MD   LORazepam (ATIVAN) injection 1 mg, 1 mg, Intravenous, Q4H PRN, Erin Fulling, MD, 1 mg at 08/25/23 0431   norepinephrine (LEVOPHED) 4mg  in (0.016 mg/mL) premix infusion, 0-40 mcg/min, Intravenous, Titrated, Harlon Ditty D, NP, Last Rate: 7.5 mL/hr at 08/27/23 1000, 2 mcg/min at 08/27/23 1000   nutrition supplement (JUVEN) (JUVEN) powder packet 1 packet, 1 packet, Per Tube, BID BM, Vida Rigger, MD, 1 packet at 08/27/23 0901   ondansetron (ZOFRAN) injection 4 mg, 4 mg, Intravenous, Q6H PRN, Rust-Chester, Micheline Rough L, NP, 4 mg at 08/23/23 2210   Oral care mouth rinse, 15 mL, Mouth Rinse, Q2H, Ladavion Savitz, MD, 15 mL at 08/27/23 0800   Oral care mouth rinse, 15 mL, Mouth Rinse, PRN, Vida Rigger, MD   pantoprazole (PROTONIX) injection 40 mg, 40 mg, Intravenous, Q24H, Kasa, Kurian, MD, 40 mg at 08/26/23 1523   polyethylene glycol (MIRALAX / GLYCOLAX) packet 17 g, 17 g, Per Tube, Daily PRN, Rust-Chester, Micheline Rough L, NP   polyethylene glycol (MIRALAX / GLYCOLAX) packet 17 g, 17 g, Per Tube, Daily, Kasa, Kurian, MD, 17 g at 08/26/23 1027   propofol (DIPRIVAN) 1000 MG/100ML infusion, 0-50 mcg/kg/min, Intravenous, Continuous, Harlon Ditty D, NP, Last Rate: 18.67 mL/hr at 08/27/23 1000, 35 mcg/kg/min at 08/27/23 1000   QUEtiapine (SEROQUEL) tablet 100 mg, 100 mg, Per Tube,  BID, Vida Rigger, MD, 100 mg at 08/27/23 0856   Racepinephrine HCl 2.25 % nebulizer solution 0.5 mL, 0.5 mL, Nebulization, Q4H PRN, Harlon Ditty D, NP, 0.5 mL at 08/23/23 1010   sodium chloride flush (NS) 0.9 % injection 10-40 mL, 10-40 mL, Intracatheter, Q12H, Erin Fulling, MD, 10 mL at 08/27/23 0911   sodium chloride flush (NS) 0.9 % injection 10-40 mL, 10-40 mL, Intracatheter, PRN, Erin Fulling, MD   sulfamethoxazole-trimethoprim (BACTRIM) 200-40 MG/5ML suspension 20 mL, 20 mL, Per Tube, Q12H, Vida Rigger, MD,  20 mL at 08/27/23 0901   [COMPLETED] thiamine (VITAMIN B1) 500 mg in sodium chloride 0.9 % 50 mL IVPB, 500 mg, Intravenous, Q24H, Stopped at 08/21/23 2203 **FOLLOWED BY** thiamine (VITAMIN B1) tablet 100 mg, 100 mg, Per Tube, Daily, Tressie Ellis, RPH, 100 mg at 08/27/23 0902   valproic acid (DEPAKENE) 250 MG/5ML solution 250 mg, 250 mg, Per Tube, Q8H, [name], RPH, 250 mg at 08/27/23 9629    ALLERGIES   Patient has no known allergies.    REVIEW OF SYSTEMS     On ventilator unable to obtain ROS  PHYSICAL EXAMINATION   Vital Signs: Temp:  [98.2 F (36.8 C)-99.8 F (37.7 C)] 98.2 F (36.8 C) (02/02 0730) Pulse Rate:  [74-94] 86 (02/02 1015) Resp:  [14-23] 18 (02/02 1015) BP: (87-119)/(51-73) 100/64 (02/02 1015) SpO2:  [93 %-97 %] 95 % (02/02 1015) FiO2 (%):  [35 %] 35 % (02/02 0948) Weight:  [89.8 kg] 89.8 kg (02/02 0500)  GENERAL:NAD age appropriate HEAD: Normocephalic, atraumatic.  EYES: Pupils equal, round, reactive to light.  No scleral icterus.  MOUTH: Moist mucosal membrane. NECK: Supple. No thyromegaly. No nodules. No JVD.  ETT+ heavy phlegm PULMONARY: MV sounds CARDIOVASCULAR: S1 and S2. Regular rate and rhythm. No murmurs, rubs, or gallops.  GASTROINTESTINAL: Soft, nontender, non-distended. No masses. Positive bowel sounds. No hepatosplenomegaly. Stage 1 ulcer on sacrum MUSCULOSKELETAL: No swelling, clubbing, or edema.  NEUROLOGIC:GCS 4T SKIN:intact,warm,dry   PERTINENT DATA     Infusions:  feeding supplement (OSMOLITE 1.5 CAL) 45 mL/hr at 08/27/23 1000   fentaNYL infusion INTRAVENOUS 125 mcg/hr (08/27/23 1000)   norepinephrine (LEVOPHED) Adult infusion 2 mcg/min (08/27/23 1000)   propofol (DIPRIVAN) infusion 35 mcg/kg/min (08/27/23 1000)   Scheduled Medications:  Chlorhexidine Gluconate Cloth  6 each Topical Daily   clonazepam  1 mg Per Tube BID   docusate  100 mg Per Tube BID   enoxaparin (LOVENOX) injection  40 mg Subcutaneous Q24H    feeding supplement (PROSource TF20)  60 mL Per Tube Daily   folic acid  1 mg Per Tube Daily   free water  30 mL Per Tube Q4H   gabapentin  800 mg Per Tube Q8H   insulin aspart  0-9 Units Subcutaneous Q4H   nutrition supplement (JUVEN)  1 packet Per Tube BID BM   mouth rinse  15 mL Mouth Rinse Q2H   pantoprazole (PROTONIX) IV  40 mg Intravenous Q24H   polyethylene glycol  17 g Per Tube Daily   QUEtiapine  100 mg Per Tube BID   sodium chloride flush  10-40 mL Intracatheter Q12H   sulfamethoxazole-trimethoprim  20 mL Per Tube Q12H   thiamine  100 mg Per Tube Daily   valproic acid  250 mg Per Tube Q8H   PRN Medications: acetaminophen, docusate, fentaNYL, glycopyrrolate, ipratropium-albuterol, LORazepam, ondansetron (ZOFRAN) IV, mouth rinse, polyethylene glycol, Racepinephrine HCl, sodium chloride flush Hemodynamic parameters:   Intake/Output: 02/01 0701 - 02/02 0700  In: 1626.4 [I.V.:864.6; NG/GT:761.8] Out: 1195 [Urine:1160; Stool:35]  Ventilator  Settings: Vent Mode: PRVC FiO2 (%):  [35 %] 35 % Set Rate:  [18 bmp] 18 bmp Vt Set:  [470 mL] 470 mL PEEP:  [5 cmH20] 5 cmH20 Plateau Pressure:  [12 cmH20-15 cmH20] 12 cmH20   LAB RESULTS:  Basic Metabolic Panel: Recent Labs  Lab 08/22/23 0414 08/23/23 0435 08/23/23 2010 08/24/23 0427 08/25/23 0434 08/26/23 0433 08/27/23 0522  NA 142 143  --  141 141 135 139  K 3.6 3.7   < > 3.8 3.9 3.5 4.3  CL 112* 105  --  107 102 98 103  CO2 25 28  --  28 29 23 26   GLUCOSE 156* 131*  --  126* 118* 207* 154*  BUN 9 15  --  12 16 23* 35*  CREATININE 0.69 0.62  --  0.53 0.51 0.79 0.90  CALCIUM 8.0* 8.5*  --  8.6* 8.7* 8.0* 8.7*  MG 2.2 2.0  --  1.8 1.8  --  2.4  PHOS 2.9 3.6  --  3.5 3.3 4.7* 3.2   < > = values in this interval not displayed.   Liver Function Tests: Recent Labs  Lab 08/23/23 0435 08/24/23 0427 08/25/23 0434 08/26/23 0433 08/27/23 0522  ALBUMIN 2.6* 2.8* 2.7* 2.7* 2.6*   No results for input(s): "LIPASE",  "AMYLASE" in the last 168 hours.  No results for input(s): "AMMONIA" in the last 168 hours. CBC: Recent Labs  Lab 08/23/23 0435 08/24/23 0427 08/25/23 0434 08/26/23 0433 08/27/23 0522  WBC 6.6 9.3 11.7* 7.8 6.4  HGB 11.2* 11.8* 12.1 11.2* 11.3*  HCT 35.5* 35.4* 37.7 35.1* 35.4*  MCV 94.7 89.6 91.5 93.6 93.2  PLT 185 192 219 164 217   Cardiac Enzymes: No results for input(s): "CKTOTAL", "CKMB", "CKMBINDEX", "TROPONINI" in the last 168 hours. BNP: Invalid input(s): "POCBNP" CBG: Recent Labs  Lab 08/25/23 0938 08/25/23 1116 08/25/23 1617 08/25/23 2005 08/27/23 0346  GLUCAP 133* 116* 120* 115* 96       IMAGING RESULTS:     ASSESSMENT AND PLAN    -Multidisciplinary rounds held today  #Acute Hypoxic Respiratory Failure secondary to multi drug overdose & suspected Aspiration #Post extubation Stridor ~ TREATED  PMHx: COPD Reintubated again 08/26/23 -Full vent support, implement lung protective strategies -Plateau pressures less than 30 cm H20 -Wean FiO2 & PEEP as tolerated to maintain O2 sats >92% -Follow intermittent Chest X-ray & ABG as needed -Spontaneous Breathing Trials when respiratory parameters met and mental status permits -Implement VAP Bundle -Prn Bronchodilators -ABX -zosyn >> augmentin- staph aureus -noted thick secretions per ETT recultured +glycopyrollate    #Hypotension: Septic shock vs Sedation related Echocardiogram: 08/17/23:  LVEF 60-65%, normal diastolic parameters, RV systolic function normal -Continuous cardiac monitoring -Maintain MAP >65 -IV fluids -Vasopressors as needed to maintain MAP goal -Lactic acid is normalized -HS Troponin negative x2 -hold outpatient losartan  -IVF - LR 1L bolus   #Multifocal Pneumonia-aspiration -Monitor fever curve -Trend WBC's & Procalcitonin -Follow cultures as above    #Acute Kidney Injury #Hypokalemia ~ RESOLVED #Mild NAGMA ~ RESOLVED -Monitor I&O's / urinary output -Follow BMP -Ensure  adequate renal perfusion -Avoid nephrotoxic agents as able -Replace electrolytes as indicated ~ Pharmacy following for assistance with electrolyte replacement -IV fluids -Consider Renal Ultrasound   #Type 2 Diabetes Mellitus Hemoglobin A1C: 6.3 (05/2023) -CBG's q4h; Target range of 140 to 180 -SSI -Follow ICU Hypo/Hyperglycemia protocol   #Acute Encephalopathy suspect secondary to drug overdose of  undetermined intent in the setting of known history of Cocaine Abuse #ETOH use with Delirium [place] #Sedation needs in setting of mechanical ventilation #Anxiety & Depression #Bipolar Disorder UDS + cocaine, marijuana & amphetamines. Blood Alcohol level elevated at 190 -Maintain a RASS goal of 0 to -1 -Fentanyl and Propofol as needed to maintain RASS goal -Avoid sedating medications as able -Daily wake up assessment -CIWA protocol -Continue Phenobarb taper -High dose thiamine x3 days followed by 100 mg daily -Folic acid and MVI -Hold outpatient regimen: Seroquel, effexor xr, vraylar. Consider restarting as patient stabilizes -once stabilized will need to screen for suicidal ideation         Best Practice (right click and "Reselect all SmartList Selections" daily)  Diet/type: NPO w/ meds via tube DVT prophylaxis LMWH Pressure ulcer(s): N/A GI prophylaxis: H2B Lines: N/A Foley:  Yes, and it is still needed Code Status:  full code Last date of multidisciplinary goals of care discussion [1/26]    ID -continue IV abx as prescibed -follow up cultures  GI/Nutrition GI PROPHYLAXIS as indicated DIET-->TF's as tolerated Constipation protocol as indicated  ENDO - ICU hypoglycemic\Hyperglycemia protocol -check FSBS per protocol   ELECTROLYTES -follow labs as needed -replace as needed -pharmacy consultation   DVT/GI PRX ordered -SCDs  TRANSFUSIONS AS NEEDED MONITOR FSBS ASSESS the need for LABS as needed    Critical care provider statement:   Total critical care  time: 33 minutes   Performed by: Karna Christmas MD   Critical care time was exclusive of separately billable procedures and treating other patients.   Critical care was necessary to treat or prevent imminent or life-threatening deterioration.   Critical care was time spent personally by me on the following activities: development of treatment plan with patient and/or surrogate as well as nursing, discussions with consultants, evaluation of patient's response to treatment, examination of patient, obtaining history from patient or surrogate, ordering and performing treatments and interventions, ordering and review of laboratory studies, ordering and review of radiographic studies, pulse oximetry and re-evaluation of patient's condition.    Vida Rigger, M.D.  Pulmonary & Critical Care Medicine

## 2023-08-27 NOTE — Plan of Care (Signed)
  Problem: Education: Goal: Ability to describe self-care measures that may prevent or decrease complications (Diabetes Survival Skills Education) will improve Outcome: Progressing   Problem: Coping: Goal: Ability to adjust to condition or change in health will improve Outcome: Progressing   Problem: Fluid Volume: Goal: Ability to maintain a balanced intake and output will improve Outcome: Progressing   Problem: Health Behavior/Discharge Planning: Goal: Ability to identify and utilize available resources and services will improve Outcome: Progressing Goal: Ability to manage health-related needs will improve Outcome: Progressing   Problem: Metabolic: Goal: Ability to maintain appropriate glucose levels will improve Outcome: Progressing   Problem: Nutritional: Goal: Maintenance of adequate nutrition will improve Outcome: Progressing Goal: Progress toward achieving an optimal weight will improve Outcome: Progressing   Problem: Skin Integrity: Goal: Risk for impaired skin integrity will decrease Outcome: Progressing   Problem: Tissue Perfusion: Goal: Adequacy of tissue perfusion will improve Outcome: Progressing   Problem: Education: Goal: Knowledge of General Education information will improve Description: Including pain rating scale, medication(s)/side effects and non-pharmacologic comfort measures Outcome: Progressing   Problem: Health Behavior/Discharge Planning: Goal: Ability to manage health-related needs will improve Outcome: Progressing   Problem: Clinical Measurements: Goal: Ability to maintain clinical measurements within normal limits will improve Outcome: Progressing Goal: Will remain free from infection Outcome: Progressing Goal: Diagnostic test results will improve Outcome: Progressing Goal: Respiratory complications will improve Outcome: Progressing Goal: Cardiovascular complication will be avoided Outcome: Progressing   Problem: Activity: Goal:  Risk for activity intolerance will decrease Outcome: Progressing   Problem: Nutrition: Goal: Adequate nutrition will be maintained Outcome: Progressing   Problem: Coping: Goal: Level of anxiety will decrease Outcome: Progressing   Problem: Elimination: Goal: Will not experience complications related to bowel motility Outcome: Progressing Goal: Will not experience complications related to urinary retention Outcome: Progressing   Problem: Pain Managment: Goal: General experience of comfort will improve and/or be controlled Outcome: Progressing   Problem: Safety: Goal: Ability to remain free from injury will improve Outcome: Progressing   Problem: Skin Integrity: Goal: Risk for impaired skin integrity will decrease Outcome: Progressing   Problem: Education: Goal: Knowledge of disease or condition will improve Outcome: Progressing Goal: Knowledge of the prescribed therapeutic regimen will improve Outcome: Progressing   Problem: Activity: Goal: Ability to tolerate increased activity will improve Outcome: Progressing Goal: Will verbalize the importance of balancing activity with adequate rest periods Outcome: Progressing   Problem: Respiratory: Goal: Ability to maintain a clear airway will improve Outcome: Progressing Goal: Levels of oxygenation will improve Outcome: Progressing Goal: Ability to maintain adequate ventilation will improve Outcome: Progressing   Problem: Activity: Goal: Ability to tolerate increased activity will improve Outcome: Progressing   Problem: Respiratory: Goal: Ability to maintain a clear airway and adequate ventilation will improve Outcome: Progressing   Problem: Role Relationship: Goal: Method of communication will improve Outcome: Progressing

## 2023-08-28 ENCOUNTER — Ambulatory Visit: Payer: Self-pay | Admitting: Family Medicine

## 2023-08-28 DIAGNOSIS — J15211 Pneumonia due to Methicillin susceptible Staphylococcus aureus: Secondary | ICD-10-CM

## 2023-08-28 DIAGNOSIS — G9341 Metabolic encephalopathy: Secondary | ICD-10-CM | POA: Diagnosis not present

## 2023-08-28 DIAGNOSIS — R4182 Altered mental status, unspecified: Secondary | ICD-10-CM

## 2023-08-28 DIAGNOSIS — F1490 Cocaine use, unspecified, uncomplicated: Secondary | ICD-10-CM

## 2023-08-28 DIAGNOSIS — E43 Unspecified severe protein-calorie malnutrition: Secondary | ICD-10-CM | POA: Insufficient documentation

## 2023-08-28 DIAGNOSIS — J9601 Acute respiratory failure with hypoxia: Secondary | ICD-10-CM | POA: Diagnosis not present

## 2023-08-28 DIAGNOSIS — F10920 Alcohol use, unspecified with intoxication, uncomplicated: Secondary | ICD-10-CM

## 2023-08-28 LAB — RENAL FUNCTION PANEL
Albumin: 2.3 g/dL — ABNORMAL LOW (ref 3.5–5.0)
Anion gap: 7 (ref 5–15)
BUN: 29 mg/dL — ABNORMAL HIGH (ref 6–20)
CO2: 27 mmol/L (ref 22–32)
Calcium: 7.5 mg/dL — ABNORMAL LOW (ref 8.9–10.3)
Chloride: 105 mmol/L (ref 98–111)
Creatinine, Ser: 0.64 mg/dL (ref 0.44–1.00)
GFR, Estimated: >60 mL/min (ref >60)
Glucose, Bld: 211 mg/dL — ABNORMAL HIGH (ref 70–99)
Phosphorus: 3.2 mg/dL (ref 2.5–4.6)
Potassium: 3.7 mmol/L (ref 3.5–5.1)
Sodium: 139 mmol/L (ref 135–145)

## 2023-08-28 LAB — GLUCOSE, CAPILLARY
Glucose-Capillary: 233 mg/dL — ABNORMAL HIGH (ref 70–99)
Glucose-Capillary: 112 mg/dL — ABNORMAL HIGH (ref 70–99)
Glucose-Capillary: 126 mg/dL — ABNORMAL HIGH (ref 70–99)
Glucose-Capillary: 139 mg/dL — ABNORMAL HIGH (ref 70–99)
Glucose-Capillary: 141 mg/dL — ABNORMAL HIGH (ref 70–99)
Glucose-Capillary: 168 mg/dL — ABNORMAL HIGH (ref 70–99)
Glucose-Capillary: 101 mg/dL — ABNORMAL HIGH (ref 70–99)
Glucose-Capillary: 114 mg/dL — ABNORMAL HIGH (ref 70–99)
Glucose-Capillary: 125 mg/dL — ABNORMAL HIGH (ref 70–99)
Glucose-Capillary: 152 mg/dL — ABNORMAL HIGH (ref 70–99)
Glucose-Capillary: 99 mg/dL (ref 70–99)
Glucose-Capillary: 93 mg/dL (ref 70–99)
Glucose-Capillary: 165 mg/dL — ABNORMAL HIGH (ref 70–99)
Glucose-Capillary: 132 mg/dL — ABNORMAL HIGH (ref 70–99)

## 2023-08-28 LAB — CBC
HCT: 32.5 % — ABNORMAL LOW (ref 36.0–46.0)
Hemoglobin: 10.3 g/dL — ABNORMAL LOW (ref 12.0–15.0)
MCH: 29.9 pg (ref 26.0–34.0)
MCHC: 31.7 g/dL (ref 30.0–36.0)
MCV: 94.5 fL (ref 80.0–100.0)
Platelets: 209 10*3/uL (ref 150–400)
RBC: 3.44 MIL/uL — ABNORMAL LOW (ref 3.87–5.11)
RDW: 12.7 % (ref 11.5–15.5)
WBC: 6.6 10*3/uL (ref 4.0–10.5)
nRBC: 0 % (ref 0.0–0.2)

## 2023-08-28 LAB — MAGNESIUM: Magnesium: 2.1 mg/dL (ref 1.7–2.4)

## 2023-08-28 MED ORDER — HYDROMORPHONE HCL-NACL 50-0.9 MG/50ML-% IV SOLN
0.5000 mg/h | INTRAVENOUS | Status: DC
  Administered 2023-08-28: 1 mg/h via INTRAVENOUS
  Administered 2023-08-29: 1.5 mg/h via INTRAVENOUS
  Administered 2023-08-30 – 2023-08-31 (×2): 1 mg/h via INTRAVENOUS
  Filled 2023-08-28 (×4): qty 50

## 2023-08-28 MED ORDER — DIAZEPAM 5 MG PO TABS
10.0000 mg | ORAL_TABLET | Freq: Two times a day (BID) | ORAL | Status: DC
  Administered 2023-08-28 – 2023-09-02 (×12): 10 mg
  Filled 2023-08-28 (×12): qty 2

## 2023-08-28 MED ORDER — DEXMEDETOMIDINE HCL IN NACL 400 MCG/100ML IV SOLN
0.0000 ug/kg/h | INTRAVENOUS | Status: DC
  Administered 2023-08-28 (×4): 1.2 ug/kg/h via INTRAVENOUS
  Administered 2023-08-29 (×2): 1 ug/kg/h via INTRAVENOUS
  Administered 2023-08-29: 1.2 ug/kg/h via INTRAVENOUS
  Administered 2023-08-29: 1 ug/kg/h via INTRAVENOUS
  Administered 2023-08-29: 0.8 ug/kg/h via INTRAVENOUS
  Administered 2023-08-30: 1 ug/kg/h via INTRAVENOUS
  Administered 2023-08-30 – 2023-08-31 (×11): 1.2 ug/kg/h via INTRAVENOUS
  Administered 2023-09-01: 1 ug/kg/h via INTRAVENOUS
  Administered 2023-09-01 (×2): 1.2 ug/kg/h via INTRAVENOUS
  Administered 2023-09-01: 0.9 ug/kg/h via INTRAVENOUS
  Administered 2023-09-01 (×2): 1.2 ug/kg/h via INTRAVENOUS
  Administered 2023-09-02: 0.6 ug/kg/h via INTRAVENOUS
  Administered 2023-09-02 (×2): 1 ug/kg/h via INTRAVENOUS
  Administered 2023-09-03: 0.6 ug/kg/h via INTRAVENOUS
  Administered 2023-09-03: 0.8 ug/kg/h via INTRAVENOUS
  Administered 2023-09-03: 0.7 ug/kg/h via INTRAVENOUS
  Administered 2023-09-03: 0.8 ug/kg/h via INTRAVENOUS
  Administered 2023-09-04: 0.6 ug/kg/h via INTRAVENOUS
  Administered 2023-09-04: 0.7 ug/kg/h via INTRAVENOUS
  Administered 2023-09-04: 1.2 ug/kg/h via INTRAVENOUS
  Administered 2023-09-04: 0.5 ug/kg/h via INTRAVENOUS
  Administered 2023-09-05: 0.7 ug/kg/h via INTRAVENOUS
  Filled 2023-08-28 (×39): qty 100

## 2023-08-28 MED ORDER — PROPOFOL 1000 MG/100ML IV EMUL
5.0000 ug/kg/min | INTRAVENOUS | Status: DC
  Administered 2023-08-28: 5 ug/kg/min via INTRAVENOUS

## 2023-08-28 MED ORDER — PROPOFOL 1000 MG/100ML IV EMUL: 5.0000 ug/kg/min | INTRAVENOUS | Status: DC

## 2023-08-28 MED ORDER — PROPOFOL 1000 MG/100ML IV EMUL
INTRAVENOUS | Status: AC
  Filled 2023-08-28: qty 100

## 2023-08-28 MED ORDER — HEPARIN SODIUM (PORCINE) 5000 UNIT/ML IJ SOLN
5000.0000 [IU] | Freq: Three times a day (TID) | INTRAMUSCULAR | Status: DC
Start: 2023-08-28 — End: 2023-08-30
  Administered 2023-08-29 (×2): 5000 [IU] via SUBCUTANEOUS
  Filled 2023-08-28 (×4): qty 1

## 2023-08-28 MED ORDER — VANCOMYCIN HCL 2000 MG/400ML IV SOLN
2000.0000 mg | Freq: Once | INTRAVENOUS | Status: AC
  Administered 2023-08-28: 2000 mg via INTRAVENOUS
  Filled 2023-08-28: qty 400

## 2023-08-28 MED ORDER — VANCOMYCIN HCL IN DEXTROSE 1-5 GM/200ML-% IV SOLN
1000.0000 mg | Freq: Two times a day (BID) | INTRAVENOUS | Status: AC
  Administered 2023-08-29 – 2023-09-01 (×8): 1000 mg via INTRAVENOUS
  Filled 2023-08-28 (×9): qty 200

## 2023-08-28 MED ORDER — QUETIAPINE FUMARATE 25 MG PO TABS
200.0000 mg | ORAL_TABLET | Freq: Three times a day (TID) | ORAL | Status: DC
  Administered 2023-08-28 – 2023-08-29 (×5): 200 mg
  Filled 2023-08-28 (×5): qty 8

## 2023-08-28 MED ORDER — HYDROMORPHONE BOLUS VIA INFUSION
0.2500 mg | INTRAVENOUS | Status: DC | PRN
  Administered 2023-08-28 (×3): 0.25 mg via INTRAVENOUS

## 2023-08-28 NOTE — Progress Notes (Signed)
Nutrition Follow-up  DOCUMENTATION CODES:   Severe malnutrition in context of acute illness/injury  INTERVENTION:   Resume Osmolite 1.5@60ml /hr continuous + ProSource TF 20- Give 60ml daily via tube  Free water flushes 30ml q4 hours to maintain tube patency   Regimen provides 2240kcal/day, 110g/day protein and 1222ml/day of free water.   Juven Fruit Punch BID via tube, each serving provides 95kcal and 2.5g of protein (amino acids glutamine and arginine)  Daily weights   Pt remains at refeed risk; recommend monitor potassium, magnesium and phosphorus labs daily until stable  NUTRITION DIAGNOSIS:   Severe Malnutrition related to acute illness as evidenced by moderate fat depletion, severe fat depletion, moderate muscle depletion, severe muscle depletion. -ongoing   GOAL:   Provide needs based on ASPEN/SCCM guidelines -not met   MONITOR:   Vent status, Labs, Weight trends, TF tolerance, Skin, I & O's  ASSESSMENT:   53 y/o female with h/o DM, bipolar disorder, substance abuse, anxiety, depression, COPD, OSA and homelessness who is admitted with AKI, overdose and aspiration PNA.  Pt re-intubated 2/1. NGT remains in place. Tube feeds have intermittently been held for vomiting (noted once and NGT dislodged into distal esophagus), agitation and extubation/re-intubation. Tube feeds currently on hold this morning as patient agitated and lying flat in bed. Pt with increased muscle and fat depletions on exam today and now meets criteria for malnutrition. Pt able to be sedated and boosted up in bed this morning; will plan to restart tube feeds at goal rate. Pt remains at high refeed risk. Plan is likely for trach/PEG. Per chart, pt appears fairly weight stable since admission.   Medications reviewed and include: colace, lovenox, folic acid, insulin, juven, protonix, miralax, thiamine vancomycin   Labs reviewed: K 3.9 wnl, P 3.3 wnl, Mg 1.8 wnl Wbc- 11.7(H) Cbgs- 116, 133, 152, 113, 117  x 24 hrs   Patient is currently intubated on ventilator support MV: 8.2 L/min Temp (24hrs), Avg:99.5 F (37.5 C), Min:98.6 F (37 C), Max:101.5 F (38.6 C)  MAP- >31mmHg   UOP-  Diet Order:   Diet Order             Diet NPO time specified  Diet effective now                  EDUCATION NEEDS:   No education needs have been identified at this time  Skin:  Skin Assessment: Reviewed RN Assessment (Stage II buttocks/coccyx, laceration L knee)  Last BM:  2/3- via rectal tube  Height:   Ht Readings from Last 1 Encounters:  08/17/23 5\' 6"  (1.676 m)    Weight:   Wt Readings from Last 1 Encounters:  08/28/23 86.9 kg    Ideal Body Weight:  61.4 kg  BMI:  Body mass index is 30.92 kg/m.  Estimated Nutritional Needs:   Kcal:  1900-2200kcal/day  Protein:  95-110g/day  Fluid:  1.8-2.1L/day  Betsey Holiday MS, RD, LDN If unable to be reached, please send secure chat to "RD inpatient" available from 8:00a-4:00p daily
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
## 2023-08-28 NOTE — Consult Note (Signed)
Pharmacy Antibiotic Note  AZADEH HYDER is a 53 y.o. female with history of polysubstance abuse, psychiatric disorder, smoking / COPD admitted on 08/17/2023 with  unresponsiveness in setting of suspect drug intoxication . Pharmacy has been consulted for vancomycin dosing.  Plan:  Vancomycin 2 g IV x 1 followed by vancomycin 1 g IV q12h --Calculated AUC: 478, Cmin 13.9 --Daily Scr per protocol --Levels at steady state or as clinically indicated  Height: 5\' 6"  (167.6 cm) Weight: 86.9 kg (191 lb 9.3 oz) IBW/kg (Calculated) : 59.3  Temp (24hrs), Avg:99.5 F (37.5 C), Min:98.6 F (37 C), Max:101.5 F (38.6 C)  Recent Labs  Lab 08/24/23 0427 08/25/23 0434 08/26/23 0433 08/27/23 0522 08/28/23 0514  WBC 9.3 11.7* 7.8 6.4 6.6  CREATININE 0.53 0.51 0.79 0.90 0.64    Estimated Creatinine Clearance: 89.2 mL/min (by C-G formula based on SCr of 0.64 mg/dL).    No Known Allergies  Antimicrobials this admission: Zosyn 1/25 >> 1/27 Augmentin 1/27 >> 1/29 Bactrim 1/29 >> 2/3 Vancomycin 2/3 >>   Dose adjustments this admission: N/A  Microbiology results: 1/23 MRSA PCR: (-) 1/23 Bcx: NG 1/25 Bcx: NG 1/26 RVP: (-) 1/26 RespCx: MRSA 1/27 BAL: MSSA  Thank you for allowing pharmacy to be a part of this patient's care.  Tressie Ellis 08/28/2023 12:40 PM

## 2023-08-28 NOTE — Consult Note (Signed)
Lujean, Ebright [id number] April 26, 1971 Sandi Mealy, MD  Reason for Consult: Tracheostomy request Requesting Physician: Raechel Chute, MD Consulting Physician: Sandi Mealy, MD  HPI: This 53 y.o. year old female was admitted on 08/17/2023 for Hypokalemia [E87.6] Hypoxia [R09.02] Cocaine use [F14.90] Marijuana use [F12.90] Hypothermia, initial encounter [T68.XXXA] Alcoholic intoxication without complication (HCC) [F10.920] Acute respiratory failure, unspecified whether with hypoxia or hypercapnia (HCC) [J96.00] Hypotension, unspecified hypotension type [I95.9] Altered mental status, unspecified altered mental status type [R41.82] Drug overdose of undetermined intent [T50.904A].   Patient with polysubstance abuse including drugs and alcohol, found unresponsive.  She has PMH of psychiatric conditions, lifelong smoking, homelesness, COPD, anxiety and depression.  Upon arrival to the ER, patient obtunded, tachypneic, hypothermic and hypertensive. Patient emergently intubated for airway protection in the setting of suspected drug overdose, hypotensive after induction requiring vasopressor support.   Hospital course: 08/21/23- patient with heavy dark inspissated ETT secretions with bronchospasm. RT worried about increased resistance on MV due to thick resp mucus. We plan to perform bronchoscopy I reviewed medical findings with Colon Branch mother of patient .  08/22/23- patient failed SBT today, failed with AMS and hypoxemia 08/25/23-patient is awake but aggitated and requiring sitter due to concerns for fall out of bed.  High risk for withdrawal from drug/alcohol abuse.   Trach aspirate with staph aureus.  She had vomit overnight.  Patient has increased O2 requirement and has been advanced to Heated HFNC.  08/26/23- patient progressively got worse with severe hypoxemia and severe aggitation required reintubation again. She may need tracheostomy for prolonged weaning from MV.  08/27/23- patient with  hypertriglyceridemia dcd prop started versed drip per pharmacy recs. Fentanyl gtt for anaglesia. On low dose levophed weaning.  Secretions moderate on PRVC with 35% FiO2.  Will need to consider tracheostomy.   Medications:  Current Facility-Administered Medications  Medication Dose Route Frequency Provider Last Rate Last Admin   acetaminophen (TYLENOL) tablet 650 mg  650 mg Per Tube Q4H PRN Rust-Chester, Cecelia Byars, NP   650 mg at 08/28/23 0414   Chlorhexidine Gluconate Cloth 2 % PADS 6 each  6 each Topical Daily Erin Fulling, MD   6 each at 08/28/23 0844   dexmedetomidine (PRECEDEX) 400 MCG/100ML (4 mcg/mL) infusion  0-1.2 mcg/kg/hr Intravenous Titrated Raechel Chute, MD 26.1 mL/hr at 08/28/23 1240 1.2 mcg/kg/hr at 08/28/23 1240   diazepam (VALIUM) tablet 10 mg  10 mg Per Tube BID Raechel Chute, MD   10 mg at 08/28/23 1228   docusate (COLACE) 50 MG/5ML liquid 100 mg  100 mg Per Tube BID PRN Rust-Chester, Cecelia Byars, NP       docusate (COLACE) 50 MG/5ML liquid 100 mg  100 mg Per Tube BID Harlon Ditty D, NP   100 mg at 08/27/23 2156   enoxaparin (LOVENOX) injection 40 mg  40 mg Subcutaneous Q24H Tressie Ellis, RPH   40 mg at 08/28/23 1324   feeding supplement (OSMOLITE 1.5 CAL) liquid 1,000 mL  1,000 mL Per Tube Continuous Vida Rigger, MD 55 mL/hr at 08/28/23 0700 Infusion Verify at 08/28/23 0700   feeding supplement (PROSource TF20) liquid 60 mL  60 mL Per Tube Daily Erin Fulling, MD   60 mL at 08/28/23 0857   folic acid (FOLVITE) tablet 1 mg  1 mg Per Tube Daily Rust-Chester, Britton L, NP   1 mg at 08/28/23 0854   free water 30 mL  30 mL Per Tube Q4H Erin Fulling, MD   30 mL at 08/28/23 1503  gabapentin (NEURONTIN) 250 MG/5ML solution 800 mg  800 mg Per Tube Q8H Aleskerov, Fuad, MD   800 mg at 08/28/23 1310   HYDROmorphone (DILAUDID) 50 mg in 50 mL NS (1mg /mL) premix infusion  0.5-4 mg/hr Intravenous Continuous Harlon Ditty D, NP 1.5 mL/hr at 08/28/23 1320 1.5 mg/hr at 08/28/23 1320    HYDROmorphone (DILAUDID) bolus via infusion 0.25-2 mg  0.25-2 mg Intravenous Q30 min PRN Harlon Ditty D, NP   0.25 mg at 08/28/23 1315   insulin aspart (novoLOG) injection 0-9 Units  0-9 Units Subcutaneous Q4H Rust-Chester, Britton L, NP   2 Units at 08/28/23 0857   ipratropium-albuterol (DUONEB) 0.5-2.5 (3) MG/3ML nebulizer solution 3 mL  3 mL Nebulization Q6H PRN Vida Rigger, MD       norepinephrine (LEVOPHED) 4mg  in (0.016 mg/mL) premix infusion  0-40 mcg/min Intravenous Titrated Judithe Modest, NP   Stopped at 08/28/23 1257   nutrition supplement (JUVEN) (JUVEN) powder packet 1 packet  1 packet Per Tube BID BM Vida Rigger, MD   1 packet at 08/28/23 1310   ondansetron (ZOFRAN) injection 4 mg  4 mg Intravenous Q6H PRN Rust-Chester, Cecelia Byars, NP   4 mg at 08/23/23 2210   Oral care mouth rinse  15 mL Mouth Rinse Q2H Vida Rigger, MD   15 mL at 08/28/23 1534   Oral care mouth rinse  15 mL Mouth Rinse PRN Vida Rigger, MD       pantoprazole (PROTONIX) injection 40 mg  40 mg Intravenous Q24H Erin Fulling, MD   40 mg at 08/28/23 1543   polyethylene glycol (MIRALAX / GLYCOLAX) packet 17 g  17 g Per Tube Daily PRN Rust-Chester, Cecelia Byars, NP       polyethylene glycol (MIRALAX / GLYCOLAX) packet 17 g  17 g Per Tube Daily Erin Fulling, MD   17 g at 08/26/23 1027   propofol (DIPRIVAN) 1000 MG/100ML infusion        Stopped at 08/28/23 0930   QUEtiapine (SEROQUEL) tablet 200 mg  200 mg Per Tube TID Raechel Chute, MD   200 mg at 08/28/23 1542   sodium chloride flush (NS) 0.9 % injection 10-40 mL  10-40 mL Intracatheter Q12H Erin Fulling, MD   10 mL at 08/28/23 0858   sodium chloride flush (NS) 0.9 % injection 10-40 mL  10-40 mL Intracatheter PRN Erin Fulling, MD       thiamine (VITAMIN B1) tablet 100 mg  100 mg Per Tube Daily Dorothea Ogle B, RPH   100 mg at 08/28/23 0858   [START ON 08/29/2023] vancomycin (VANCOCIN) IVPB 1000 mg/200 mL premix  1,000 mg Intravenous Q12H Tressie Ellis, RPH       venlafaxine [facility]) tablet 75 mg  75 mg Per Tube BID Vida Rigger, MD   75 mg at 08/28/23 0852  .  Medications Prior to Admission  Medication Sig Dispense Refill   clonazePAM (KLONOPIN) 1 MG tablet Take 1 mg by mouth 2 (two) times daily as needed for anxiety.     gabapentin (NEURONTIN) 800 MG tablet TAKE 1 TABLET BY MOUTH 3 TIMES DAILY AS NEEDED. 90 tablet 0   naproxen (NAPROSYN) 500 MG tablet Take 1 tablet (500 mg total) by mouth 2 (two) times daily with a meal. 60 tablet 0   QUEtiapine (SEROQUEL) 400 MG tablet Take 400 mg by mouth at bedtime.     venlafaxine XR (EFFEXOR-XR) 150 MG 24 hr capsule Take 150 mg by mouth every morning.  VRAYLAR 3 MG capsule Take 3 mg by mouth daily.      Allergies: No Known Allergies  PMH:  Past Medical History:  Diagnosis Date   Allergy    Anxiety    Arthritis    Asthma    Bipolar 1 disorder (HCC)    Cancer (HCC)    COPD (chronic obstructive pulmonary disease) (HCC)    Depression    Diabetes mellitus without complication (HCC)    Hypertension    Neuromuscular disorder (HCC)    Osteoporosis    Sleep apnea     Fam Hx:  Family History  Problem Relation Age of Onset   Depression Mother    Diabetes Mother    Cancer Father     Soc Hx:  Social History   Socioeconomic History   Marital status: Divorced    Spouse name: Not on file   Number of children: Not on file   Years of education: Not on file   Highest education level: Not on file  Occupational History   Not on file  Tobacco Use   Smoking status: Every Day    Current packs/day: 1.00    Types: Cigarettes   Smokeless tobacco: Never  Substance and Sexual Activity   Alcohol use: Not Currently    Alcohol/week: 2.0 standard drinks of alcohol    Types: 2 Standard drinks or equivalent per week   Drug use: Yes    Types: Cocaine    Comment: uses on occasions   Sexual activity: Yes  Other Topics Concern   Not on file  Social History Narrative   Based on  results of PHQ 9 and GAD 7 recommendation that patient follow up within in one week for an assessment with Carey Bullocks LCSW.    Social Drivers of Corporate investment banker Strain: High Risk (08/29/2017)   Overall Financial Resource Strain (CARDIA)    Difficulty of Paying Living Expenses: Very hard  Food Insecurity: Patient Unable To Answer (08/17/2023)   Hunger Vital Sign    Worried About Running Out of Food in the Last Year: Patient unable to answer    Ran Out of Food in the Last Year: Patient unable to answer  Transportation Needs: Patient Unable To Answer (08/17/2023)   PRAPARE - Transportation    Lack of Transportation (Medical): Patient unable to answer    Lack of Transportation (Non-Medical): Patient unable to answer  Physical Activity: Inactive (08/29/2017)   Exercise Vital Sign    Days of Exercise per Week: 0 days    Minutes of Exercise per Session: 0 min  Stress: Stress Concern Present (08/29/2017)   [employer] of Occupational Health - Occupational Stress Questionnaire    Feeling of Stress : Very much  Social Connections: Moderately Isolated (08/29/2017)   Social Connection and Isolation Panel [NHANES]    Frequency of Communication with Friends and Family: Three times a week    Frequency of Social Gatherings with Friends and Family: Once a week    Attends Religious Services: Never    Database administrator or Organizations: No    Attends Banker Meetings: Never    Marital Status: Never married  Intimate Partner Violence: Unknown (08/17/2023)   Humiliation, Afraid, Rape, and Kick questionnaire    Fear of Current or Ex-Partner: Patient unable to answer    Emotionally Abused: Patient unable to answer    Physically Abused: Not on file    Sexually Abused: Patient unable to answer    PSH:  Past Surgical History:  Procedure Laterality Date   ANKLE SURGERY Right    PERIPHERAL VASCULAR THROMBECTOMY Left 1991  . Procedures since admission: No admission  procedures for hospital encounter.  ROS: Review of systems normal other than 12 systems except per HPI.  PHYSICAL EXAM  Vitals: Blood pressure 95/62, pulse 62, temperature 99.3 F (37.4 C), temperature source Oral, resp. rate 18, height 5\' 6"  (1.676 m), weight 86.9 kg, SpO2 94%.. General: Well-developed, Well-nourished on vent and sedated Mood: Unable to assess. Patient on vent and sedated Orientation: Patient on vent and sedated. Vocal Quality: Unable to assess. Patient intubated, on vent and sedated Head and Face: NCAT. No facial asymmetry. No visible skin lesions. No significant facial scars. Ears: External ears with normal landmarks, no lesions. External auditory canals free of infection, cerumen impaction or lesions. Tympanic membranes intact with good landmarks and normal mobility on pneumatic otoscopy. No middle ear effusion. Hearing: Unable to assess. Patient on vent and sedated Nose: External nose normal with midline dorsum and no lesions or deformity. Nasal Cavity reveals essentially midline septum with normal inferior turbinates. Feeding tube present. No significant mucosal congestion or erythema. Nasal secretions are minimal and clear. No polyps seen on anterior rhinoscopy. Oral Cavity/ Oropharynx: Lips are normal with no lesions. Edentulous, gingiva appear normal. Visualized tongue, floor of mouth and pharynx appear normal, though exam is limited due to presence of endotracheal tube. Indirect Laryngoscopy/Nasopharyngoscopy: Visualization of the larynx, hypopharynx and nasopharynx is not possible in this setting with routine examination. Neck: Supple and symmetric with no palpable masses, tenderness or crepitance. The trachea is midline. Thyroid gland is soft, nontender and symmetric with no masses or enlargement. Parotid and submandibular glands are soft, nontender and symmetric, without masses. Lymphatic: Cervical lymph nodes are without palpable lymphadenopathy or  tenderness. Respiratory: Patient on ventilator for respiratory support Cardiovascular: Carotid pulse shows regular rate and rhythm Neurologic: Unable to assess. Patient on vent and sedated Eyes: Unable to assess. Patient on vent and sedated  MEDICAL DECISION MAKING: Data Review:  Results for orders placed or performed during the hospital encounter of 08/17/23 (from the past 48 hours)  Glucose, capillary     Status: Abnormal   Collection Time: 08/26/23  7:39 PM  Result Value Ref Range   Glucose-Capillary 112 (H) 70 - 99 mg/dL    Comment: Glucose reference range applies only to samples taken after fasting for at least 8 hours.  Glucose, capillary     Status: Abnormal   Collection Time: 08/26/23 11:47 PM  Result Value Ref Range   Glucose-Capillary 126 (H) 70 - 99 mg/dL    Comment: Glucose reference range applies only to samples taken after fasting for at least 8 hours.  Glucose, capillary     Status: None   Collection Time: 08/27/23  3:46 AM  Result Value Ref Range   Glucose-Capillary 96 70 - 99 mg/dL    Comment: Glucose reference range applies only to samples taken after fasting for at least 8 hours.  CBC     Status: Abnormal   Collection Time: 08/27/23  5:22 AM  Result Value Ref Range   WBC 6.4 4.0 - 10.5 K/uL   RBC 3.80 (L) 3.87 - 5.11 MIL/uL   Hemoglobin 11.3 (L) 12.0 - 15.0 g/dL   HCT 16.1 (L) 09.6 - 04.5 %   MCV 93.2 80.0 - 100.0 fL   MCH 29.7 26.0 - 34.0 pg   MCHC 31.9 30.0 - 36.0 g/dL   RDW 40.9 81.1 -  15.5 %   Platelets 217 150 - 400 K/uL   nRBC 0.0 0.0 - 0.2 %    Comment: Performed at [facility], [address] [place], [place] [address]  Renal function panel     Status: Abnormal   Collection Time: 08/27/23  5:22 AM  Result Value Ref Range   Sodium 139 135 - 145 mmol/L   Potassium 4.3 3.5 - 5.1 mmol/L   Chloride 103 98 - 111 mmol/L   CO2 26 22 - 32 mmol/L   Glucose, Bld 154 (H) 70 - 99 mg/dL    Comment: Glucose reference range applies only to samples  taken after fasting for at least 8 hours.   BUN 35 (H) 6 - 20 mg/dL   Creatinine, Ser 6.04 0.44 - 1.00 mg/dL   Calcium 8.7 (L) 8.9 - 10.3 mg/dL   Phosphorus 3.2 2.5 - 4.6 mg/dL   Albumin 2.6 (L) 3.5 - 5.0 g/dL   GFR, Estimated >54 >09 mL/min    Comment: (NOTE) Calculated using the CKD-EPI Creatinine Equation (2021)    Anion gap 10 5 - 15    Comment: Performed at [facility], [address], [place], [place] [address]  Magnesium     Status: None   Collection Time: 08/27/23  5:22 AM  Result Value Ref Range   Magnesium 2.4 1.7 - 2.4 mg/dL    Comment: Performed at [facility], [address] [place], [place] [address]  Triglycerides     Status: Abnormal   Collection Time: 08/27/23  5:22 AM  Result Value Ref Range   Triglycerides 400 (H) <150 mg/dL    Comment: Performed at [facility], [address] [place], [place] [address]  Glucose, capillary     Status: Abnormal   Collection Time: 08/27/23  8:01 AM  Result Value Ref Range   Glucose-Capillary 139 (H) 70 - 99 mg/dL    Comment: Glucose reference range applies only to samples taken after fasting for at least 8 hours.  Glucose, capillary     Status: Abnormal   Collection Time: 08/27/23 11:13 AM  Result Value Ref Range   Glucose-Capillary 133 (H) 70 - 99 mg/dL    Comment: Glucose reference range applies only to samples taken after fasting for at least 8 hours.  Glucose, capillary     Status: Abnormal   Collection Time: 08/27/23  4:05 PM  Result Value Ref Range   Glucose-Capillary 154 (H) 70 - 99 mg/dL    Comment: Glucose reference range applies only to samples taken after fasting for at least 8 hours.  Glucose, capillary     Status: Abnormal   Collection Time: 08/27/23  7:55 PM  Result Value Ref Range   Glucose-Capillary 141 (H) 70 - 99 mg/dL    Comment: Glucose reference range applies only to samples taken after fasting for at least 8 hours.  Glucose, capillary     Status: Abnormal   Collection  Time: 08/27/23 11:49 PM  Result Value Ref Range   Glucose-Capillary 179 (H) 70 - 99 mg/dL    Comment: Glucose reference range applies only to samples taken after fasting for at least 8 hours.  Glucose, capillary     Status: Abnormal   Collection Time: 08/28/23  4:09 AM  Result Value Ref Range   Glucose-Capillary 233 (H) 70 - 99 mg/dL    Comment: Glucose reference range applies only to samples taken after fasting for at least 8 hours.  Magnesium  Status: None   Collection Time: 08/28/23  5:13 AM  Result Value Ref Range   Magnesium 2.1 1.7 - 2.4 mg/dL    Comment: Performed at [facility], [address] [place], [place] [address]  CBC     Status: Abnormal   Collection Time: 08/28/23  5:14 AM  Result Value Ref Range   WBC 6.6 4.0 - 10.5 K/uL   RBC 3.44 (L) 3.87 - 5.11 MIL/uL   Hemoglobin 10.3 (L) 12.0 - 15.0 g/dL   HCT 96.2 (L) 95.2 - 84.1 %   MCV 94.5 80.0 - 100.0 fL   MCH 29.9 26.0 - 34.0 pg   MCHC 31.7 30.0 - 36.0 g/dL   RDW 32.4 40.1 - 02.7 %   Platelets 209 150 - 400 K/uL   nRBC 0.0 0.0 - 0.2 %    Comment: Performed at [facility], [address] [place], [place] [address]  Renal function panel     Status: Abnormal   Collection Time: 08/28/23  5:14 AM  Result Value Ref Range   Sodium 139 135 - 145 mmol/L   Potassium 3.7 3.5 - 5.1 mmol/L   Chloride 105 98 - 111 mmol/L   CO2 27 22 - 32 mmol/L   Glucose, Bld 211 (H) 70 - 99 mg/dL    Comment: Glucose reference range applies only to samples taken after fasting for at least 8 hours.   BUN 29 (H) 6 - 20 mg/dL   Creatinine, Ser 4.40 0.44 - 1.00 mg/dL   Calcium 7.5 (L) 8.9 - 10.3 mg/dL   Phosphorus 3.2 2.5 - 4.6 mg/dL   Albumin 2.3 (L) 3.5 - 5.0 g/dL   GFR, Estimated >34 >74 mL/min    Comment: (NOTE) Calculated using the CKD-EPI Creatinine Equation (2021)    Anion gap 7 5 - 15    Comment: Performed at [facility], [address] [place], [place] [address]  Glucose, capillary     Status:  Abnormal   Collection Time: 08/28/23  7:25 AM  Result Value Ref Range   Glucose-Capillary 168 (H) 70 - 99 mg/dL    Comment: Glucose reference range applies only to samples taken after fasting for at least 8 hours.   Comment 1 Notify RN   Glucose, capillary     Status: None   Collection Time: 08/28/23 11:28 AM  Result Value Ref Range   Glucose-Capillary 93 70 - 99 mg/dL    Comment: Glucose reference range applies only to samples taken after fasting for at least 8 hours.  Glucose, capillary     Status: Abnormal   Collection Time: 08/28/23  4:23 PM  Result Value Ref Range   Glucose-Capillary 165 (H) 70 - 99 mg/dL    Comment: Glucose reference range applies only to samples taken after fasting for at least 8 hours.  . No results found..   ASSESSMENT: Respiratory failure PLAN: Critical care consult involving over 31 minutes of consultation, chart review, examination, discussion with family, and care coordination. Will schedule tracheostomy. Risks including bleeding, infection, scarring, recurrent laryngeal nerve injury, respiratory compromise discussed with patient's mother, Colon Branch, by phone and consent obtained. Looking at Friday AM. Please switch patient to HEPARIN rather than Lovenox for anticoagulation. I will be putting orders in to hold tube feeds the night before surgery and hold heparin prior to procedure. If the primary service could please stress to staff the importance of making sure these things are done so the case does not get cancelled.  Sandi Mealy, MD 08/28/2023 4:26 PM

## 2023-08-28 NOTE — Plan of Care (Signed)
  Problem: Education: Goal: Ability to describe self-care measures that may prevent or decrease complications (Diabetes Survival Skills Education) will improve Outcome: Progressing   Problem: Coping: Goal: Ability to adjust to condition or change in health will improve Outcome: Progressing   Problem: Fluid Volume: Goal: Ability to maintain a balanced intake and output will improve Outcome: Progressing   Problem: Health Behavior/Discharge Planning: Goal: Ability to identify and utilize available resources and services will improve Outcome: Progressing Goal: Ability to manage health-related needs will improve Outcome: Progressing   Problem: Metabolic: Goal: Ability to maintain appropriate glucose levels will improve Outcome: Progressing   Problem: Nutritional: Goal: Maintenance of adequate nutrition will improve Outcome: Progressing Goal: Progress toward achieving an optimal weight will improve Outcome: Progressing   Problem: Skin Integrity: Goal: Risk for impaired skin integrity will decrease Outcome: Progressing   Problem: Tissue Perfusion: Goal: Adequacy of tissue perfusion will improve Outcome: Progressing   Problem: Education: Goal: Knowledge of General Education information will improve Description: Including pain rating scale, medication(s)/side effects and non-pharmacologic comfort measures Outcome: Progressing   Problem: Health Behavior/Discharge Planning: Goal: Ability to manage health-related needs will improve Outcome: Progressing   Problem: Clinical Measurements: Goal: Ability to maintain clinical measurements within normal limits will improve Outcome: Progressing Goal: Will remain free from infection Outcome: Progressing Goal: Diagnostic test results will improve Outcome: Progressing Goal: Respiratory complications will improve Outcome: Progressing Goal: Cardiovascular complication will be avoided Outcome: Progressing   Problem: Activity: Goal:  Risk for activity intolerance will decrease Outcome: Progressing   Problem: Nutrition: Goal: Adequate nutrition will be maintained Outcome: Progressing   Problem: Coping: Goal: Level of anxiety will decrease Outcome: Progressing   Problem: Elimination: Goal: Will not experience complications related to bowel motility Outcome: Progressing Goal: Will not experience complications related to urinary retention Outcome: Progressing   Problem: Pain Managment: Goal: General experience of comfort will improve and/or be controlled Outcome: Progressing   Problem: Safety: Goal: Ability to remain free from injury will improve Outcome: Progressing   Problem: Skin Integrity: Goal: Risk for impaired skin integrity will decrease Outcome: Progressing   Problem: Education: Goal: Knowledge of disease or condition will improve Outcome: Progressing Goal: Knowledge of the prescribed therapeutic regimen will improve Outcome: Progressing   Problem: Activity: Goal: Ability to tolerate increased activity will improve Outcome: Progressing Goal: Will verbalize the importance of balancing activity with adequate rest periods Outcome: Progressing   Problem: Respiratory: Goal: Ability to maintain a clear airway will improve Outcome: Progressing Goal: Levels of oxygenation will improve Outcome: Progressing Goal: Ability to maintain adequate ventilation will improve Outcome: Progressing   Problem: Activity: Goal: Ability to tolerate increased activity will improve Outcome: Progressing   Problem: Respiratory: Goal: Ability to maintain a clear airway and adequate ventilation will improve Outcome: Progressing   Problem: Role Relationship: Goal: Method of communication will improve Outcome: Progressing

## 2023-08-29 ENCOUNTER — Inpatient Hospital Stay: Payer: Medicaid Other

## 2023-08-29 ENCOUNTER — Other Ambulatory Visit: Payer: Self-pay

## 2023-08-29 DIAGNOSIS — J15211 Pneumonia due to Methicillin susceptible Staphylococcus aureus: Secondary | ICD-10-CM | POA: Diagnosis not present

## 2023-08-29 DIAGNOSIS — R4182 Altered mental status, unspecified: Secondary | ICD-10-CM | POA: Diagnosis not present

## 2023-08-29 LAB — GLUCOSE, CAPILLARY
Glucose-Capillary: 134 mg/dL — ABNORMAL HIGH (ref 70–99)
Glucose-Capillary: 143 mg/dL — ABNORMAL HIGH (ref 70–99)
Glucose-Capillary: 159 mg/dL — ABNORMAL HIGH (ref 70–99)
Glucose-Capillary: 172 mg/dL — ABNORMAL HIGH (ref 70–99)
Glucose-Capillary: 199 mg/dL — ABNORMAL HIGH (ref 70–99)
Glucose-Capillary: 246 mg/dL — ABNORMAL HIGH (ref 70–99)
Glucose-Capillary: 263 mg/dL — ABNORMAL HIGH (ref 70–99)

## 2023-08-29 LAB — BASIC METABOLIC PANEL
Anion gap: 8 (ref 5–15)
BUN: 26 mg/dL — ABNORMAL HIGH (ref 6–20)
CO2: 26 mmol/L (ref 22–32)
Calcium: 8.5 mg/dL — ABNORMAL LOW (ref 8.9–10.3)
Chloride: 103 mmol/L (ref 98–111)
Creatinine, Ser: 0.64 mg/dL (ref 0.44–1.00)
GFR, Estimated: >60 mL/min (ref >60)
Glucose, Bld: 269 mg/dL — ABNORMAL HIGH (ref 70–99)
Potassium: 4.1 mmol/L (ref 3.5–5.1)
Sodium: 137 mmol/L (ref 135–145)

## 2023-08-29 LAB — CBC
HCT: 35.6 % — ABNORMAL LOW (ref 36.0–46.0)
Hemoglobin: 11.5 g/dL — ABNORMAL LOW (ref 12.0–15.0)
MCH: 29.9 pg (ref 26.0–34.0)
MCHC: 32.3 g/dL (ref 30.0–36.0)
MCV: 92.5 fL (ref 80.0–100.0)
Platelets: 219 10*3/uL (ref 150–400)
RBC: 3.85 MIL/uL — ABNORMAL LOW (ref 3.87–5.11)
RDW: 12.7 % (ref 11.5–15.5)
WBC: 9.3 10*3/uL (ref 4.0–10.5)
nRBC: 0 % (ref 0.0–0.2)

## 2023-08-29 LAB — MAGNESIUM: Magnesium: 2 mg/dL (ref 1.7–2.4)

## 2023-08-29 LAB — PHOSPHORUS: Phosphorus: 3.8 mg/dL (ref 2.5–4.6)

## 2023-08-29 MED ORDER — CEFAZOLIN SODIUM-DEXTROSE 2-4 GM/100ML-% IV SOLN
2.0000 g | INTRAVENOUS | Status: AC
  Administered 2023-08-29: 2 g via INTRAVENOUS
  Filled 2023-08-29: qty 100

## 2023-08-29 MED ORDER — INSULIN ASPART 100 UNIT/ML IJ SOLN
0.0000 [IU] | INTRAMUSCULAR | Status: DC
  Administered 2023-08-29: 4 [IU] via SUBCUTANEOUS
  Administered 2023-08-30: 5 [IU] via SUBCUTANEOUS
  Administered 2023-08-30: 4 [IU] via SUBCUTANEOUS
  Administered 2023-08-30 (×2): 3 [IU] via SUBCUTANEOUS
  Administered 2023-08-30 (×2): 4 [IU] via SUBCUTANEOUS
  Administered 2023-08-31: 3 [IU] via SUBCUTANEOUS
  Administered 2023-08-31: 11 [IU] via SUBCUTANEOUS
  Administered 2023-08-31: 3 [IU] via SUBCUTANEOUS
  Administered 2023-08-31: 11 [IU] via SUBCUTANEOUS
  Administered 2023-09-01: 7 [IU] via SUBCUTANEOUS
  Administered 2023-09-01: 4 [IU] via SUBCUTANEOUS
  Administered 2023-09-01 – 2023-09-02 (×3): 3 [IU] via SUBCUTANEOUS
  Administered 2023-09-02: 4 [IU] via SUBCUTANEOUS
  Administered 2023-09-02 (×2): 7 [IU] via SUBCUTANEOUS
  Administered 2023-09-02 – 2023-09-03 (×4): 4 [IU] via SUBCUTANEOUS
  Administered 2023-09-03: 3 [IU] via SUBCUTANEOUS
  Administered 2023-09-03: 4 [IU] via SUBCUTANEOUS
  Administered 2023-09-03: 3 [IU] via SUBCUTANEOUS
  Administered 2023-09-04: 4 [IU] via SUBCUTANEOUS
  Administered 2023-09-04 – 2023-09-05 (×5): 3 [IU] via SUBCUTANEOUS
  Administered 2023-09-05 – 2023-09-06 (×3): 4 [IU] via SUBCUTANEOUS
  Administered 2023-09-06: 3 [IU] via SUBCUTANEOUS
  Administered 2023-09-06 (×3): 4 [IU] via SUBCUTANEOUS
  Administered 2023-09-07: 3 [IU] via SUBCUTANEOUS
  Administered 2023-09-07: 4 [IU] via SUBCUTANEOUS
  Administered 2023-09-07: 3 [IU] via SUBCUTANEOUS
  Administered 2023-09-07 (×2): 7 [IU] via SUBCUTANEOUS
  Administered 2023-09-08 (×2): 4 [IU] via SUBCUTANEOUS
  Administered 2023-09-08: 7 [IU] via SUBCUTANEOUS
  Administered 2023-09-08 – 2023-09-09 (×2): 3 [IU] via SUBCUTANEOUS
  Administered 2023-09-09: 4 [IU] via SUBCUTANEOUS
  Administered 2023-09-09: 3 [IU] via SUBCUTANEOUS
  Administered 2023-09-09: 4 [IU] via SUBCUTANEOUS
  Administered 2023-09-09 – 2023-09-10 (×3): 3 [IU] via SUBCUTANEOUS
  Administered 2023-09-10: 4 [IU] via SUBCUTANEOUS
  Administered 2023-09-11 (×4): 3 [IU] via SUBCUTANEOUS
  Administered 2023-09-12: 4 [IU] via SUBCUTANEOUS
  Administered 2023-09-12 (×2): 3 [IU] via SUBCUTANEOUS
  Administered 2023-09-12: 4 [IU] via SUBCUTANEOUS
  Administered 2023-09-12 – 2023-09-13 (×2): 3 [IU] via SUBCUTANEOUS
  Filled 2023-08-29 (×69): qty 1

## 2023-08-29 MED ORDER — INSULIN ASPART 100 UNIT/ML IJ SOLN
3.0000 [IU] | INTRAMUSCULAR | Status: DC
  Administered 2023-08-29 (×3): 6 [IU] via SUBCUTANEOUS
  Filled 2023-08-29 (×3): qty 1

## 2023-08-29 MED ORDER — IOHEXOL 300 MG/ML  SOLN: 75.0000 mL | Freq: Once | INTRAMUSCULAR | Status: DC | PRN

## 2023-08-29 NOTE — Progress Notes (Addendum)
 0800 Opens eyes with stimulation. MAE but not to command.  1000 Bed and bath done.  1100 Mother called for update and spoke to daughter via nurses ASCOM phone. Patient opened eyes and looked around. 1207 Placed back on Levophed  due to low blood pressures at 5 mcg. 1230 Down to CT for CT of abdomen for peg placement later this week. CT tech had to secure patients legs. 1330 Bolus given to patient for RASS of +4. 1400 Patient calmer. 1800 Rested quietly.
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
## 2023-08-29 NOTE — Plan of Care (Signed)
  Problem: Education: Goal: Ability to describe self-care measures that may prevent or decrease complications (Diabetes Survival Skills Education) will improve Outcome: Progressing   Problem: Coping: Goal: Ability to adjust to condition or change in health will improve Outcome: Progressing   Problem: Coping: Goal: Ability to adjust to condition or change in health will improve Outcome: Progressing   Problem: Health Behavior/Discharge Planning: Goal: Ability to identify and utilize available resources and services will improve Outcome: Progressing   Problem: Health Behavior/Discharge Planning: Goal: Ability to identify and utilize available resources and services will improve Outcome: Progressing   Problem: Metabolic: Goal: Ability to maintain appropriate glucose levels will improve Outcome: Progressing   Problem: Metabolic: Goal: Ability to maintain appropriate glucose levels will improve Outcome: Progressing   Problem: Tissue Perfusion: Goal: Adequacy of tissue perfusion will improve Outcome: Progressing

## 2023-08-29 NOTE — Consult Note (Signed)
 Chief Complaint: Patient was seen in consultation today for malnutrition s/p acute illness/injury   Procedure: PEG tube placement  Referring Physician(s): Inge Lecher, NP (CCM)  Supervising Physician: Hughes Simmonds  Patient Status: [facility] - In-pt  History of Present Illness: Andrea Sanford is a 53 y.o. female who, per chart review, has a history of T2DM, HTN, Bipolar disorder, anxiety, depression, alcohol use, and polysubstance abuse who initially presented to the ED via EMS on 08/17/23 after being found unresponsive in a personal vehicle with an acquaintance. Found to have acute metabolic encephalopathy and acute hypoxic respiratory failure secondary to drug OD, alcohol intoxication, aspiration, and staph aureus pneumonia. Patient has required intubation and mechanical ventilation since admission. She has failed extubation twice and ENT has been consulted for possible tracheostomy placement. Intermittently receiving tube feeds. IR consulted for possible gastrostomy tube placement. CT Abdomen on 08/29/23 significant for:   IMPRESSION: 1. Anatomy is amenable for percutaneous gastrostomy. Mild air distention of the splenic flexure of colon, intervening the gastric antrum. Consider a trial of contrast opacification of colon for better visualization. Final decision making is reserved for the performing physician.  Images and case reviewed by Dr. JINNY Hughes. Will plan for contrast via NG tube this evening (08/29/23) followed by KUB in the morning for better colonic visualization.  Patient currently intubated and sedated.  Tube feeds to be held at midnight. Last dose SQ heparin  given at 1427 on 08/29/23. Plan to hold dose evening and morning dose for G-tube procedure on 08/30/23.   Code Status: Full code  Past Medical History:  Diagnosis Date   Allergy    Anxiety    Arthritis    Asthma    Bipolar 1 disorder (HCC)    Cancer (HCC)    COPD (chronic obstructive pulmonary disease) (HCC)     Depression    Diabetes mellitus without complication (HCC)    Hypertension    Neuromuscular disorder (HCC)    Osteoporosis    Sleep apnea     Past Surgical History:  Procedure Laterality Date   ANKLE SURGERY Right    PERIPHERAL VASCULAR THROMBECTOMY Left 1991    Allergies: Patient has no known allergies.  Medications: Prior to Admission medications   Medication Sig Start Date End Date Taking? Authorizing Provider  clonazePAM  (KLONOPIN ) 1 MG tablet Take 1 mg by mouth 2 (two) times daily as needed for anxiety.   Yes [provider]  gabapentin  (NEURONTIN ) 800 MG tablet TAKE 1 TABLET BY MOUTH 3 TIMES DAILY AS NEEDED. 08/14/23   Pardue, Lauraine SAILOR, DO  losartan  (COZAAR ) 25 MG tablet TAKE 1 TABLET (25 MG TOTAL) BY MOUTH DAILY. 08/21/23   Donzella Lauraine SAILOR, DO  naproxen  (NAPROSYN ) 500 MG tablet Take 1 tablet (500 mg total) by mouth 2 (two) times daily with a meal. 07/24/23   Wellington Curtis LABOR, FNP  QUEtiapine  (SEROQUEL ) 400 MG tablet Take 400 mg by mouth at bedtime.    [provider]  venlafaxine  XR (EFFEXOR -XR) 150 MG 24 hr capsule Take 150 mg by mouth every morning. 06/09/23   [provider]  VRAYLAR 3 MG capsule Take 3 mg by mouth daily. 11/28/20   [provider]     Family History  Problem Relation Age of Onset   Depression Mother    Diabetes Mother    Cancer Father     Social History   Socioeconomic History   Marital status: Divorced    Spouse name: Not on file   Number  of children: Not on file   Years of education: Not on file   Highest education level: Not on file  Occupational History   Not on file  Tobacco Use   Smoking status: Every Day    Current packs/day: 1.00    Types: Cigarettes   Smokeless tobacco: Never  Substance and Sexual Activity   Alcohol use: Not Currently    Alcohol/week: 2.0 standard drinks of alcohol    Types: 2 Standard drinks or equivalent per week   Drug use: Yes    Types: Cocaine    Comment: uses on  occasions   Sexual activity: Yes  Other Topics Concern   Not on file  Social History Narrative   Based on results of PHQ 9 and GAD 7 recommendation that patient follow up within in one week for an assessment with Powell Pesa LCSW.    Social Drivers of Corporate Investment Banker Strain: High Risk (08/29/2017)   Overall Financial Resource Strain (CARDIA)    Difficulty of Paying Living Expenses: Very hard  Food Insecurity: Patient Unable To Answer (08/17/2023)   Hunger Vital Sign    Worried About Running Out of Food in the Last Year: Patient unable to answer    Ran Out of Food in the Last Year: Patient unable to answer  Transportation Needs: Patient Unable To Answer (08/17/2023)   PRAPARE - Transportation    Lack of Transportation (Medical): Patient unable to answer    Lack of Transportation (Non-Medical): Patient unable to answer  Physical Activity: Inactive (08/29/2017)   Exercise Vital Sign    Days of Exercise per Week: 0 days    Minutes of Exercise per Session: 0 min  Stress: Stress Concern Present (08/29/2017)   [employer] of Occupational Health - Occupational Stress Questionnaire    Feeling of Stress : Very much  Social Connections: Moderately Isolated (08/29/2017)   Social Connection and Isolation Panel [NHANES]    Frequency of Communication with Friends and Family: Three times a week    Frequency of Social Gatherings with Friends and Family: Once a week    Attends Religious Services: Never    Database Administrator or Organizations: No    Attends Banker Meetings: Never    Marital Status: Never married    Review of Systems  Reason unable to perform ROS: Intubated and sedated.   Vital Signs: BP (!) 89/56   Pulse (!) 59   Temp 100.2 F (37.9 C)   Resp 18   Ht 5' 6 (1.676 m)   Wt 193 lb 5.5 oz (87.7 kg)   SpO2 94%   BMI 31.21 kg/m    Physical Exam Vitals reviewed.  Constitutional:      Appearance: Normal appearance.  HENT:     Head:  Normocephalic and atraumatic.     Mouth/Throat:     Mouth: Mucous membranes are moist.     Comments: ETT in place Cardiovascular:     Rate and Rhythm: Normal rate and regular rhythm.     Heart sounds: Normal heart sounds.  Pulmonary:     Comments: Ventilated breath sounds bilaterally  Abdominal:     General: Abdomen is flat. There is no distension.     Palpations: Abdomen is soft.  Skin:    General: Skin is warm and dry.  Neurological:     Comments: Sedated; occasionally opens eyes     Imaging: DG Chest Port 1 View Result Date: 08/29/2023 CLINICAL DATA:  Acute respiratory failure  and hypoxia. EXAM: PORTABLE CHEST 1 VIEW COMPARISON:  Chest radiograph dated 08/25/2023. FINDINGS: Interval retraction of the endotracheal tube with tip at the thoracic inlet. Right-sided PICC and enteric tube in similar position. There is shallow inspiration. Left lung base density may represent atelectasis or infiltrate. No pneumothorax. Stable cardiac silhouette. No acute osseous pathology. IMPRESSION: 1. Interval retraction of the endotracheal tube with tip at the thoracic inlet. 2. Left lung base atelectasis or infiltrate. Electronically Signed   By: Vanetta Chou M.D.   On: 08/29/2023 09:34   DG Chest Port 1 View Result Date: 08/25/2023 CLINICAL DATA:  Intubation/respiratory failure EXAM: PORTABLE CHEST 1 VIEW COMPARISON:  08/19/2023 FINDINGS: Endotracheal tube tip 3.7 cm above the carina, satisfactorily positioned. Nasogastric tube extends into the stomach. Right PICC line tip: SVC. Right perihilar and basilar densities have mostly cleared. There is likely still some mild residual airspace opacity in the left lower lobe. Healing right midclavicular fracture with osteoid and woven bone deposition. IMPRESSION: 1. Endotracheal tube tip 3.7 cm above the carina, satisfactorily positioned. 2. Right perihilar and basilar densities have mostly cleared. There is likely still some mild residual airspace opacity in the  left lower lobe. 3. Healing right midclavicular fracture. Electronically Signed   By: Ryan Salvage M.D.   On: 08/25/2023 18:59   DG Abd 1 View Result Date: 08/25/2023 CLINICAL DATA:  NG tube placement EXAM: ABDOMEN - 1 VIEW limited x-ray for tube placement COMPARISON:  08/18/2023 x-ray FINDINGS: Limited x-ray of the upper abdomen for tube placement has enteric tube with tip extending up towards and overlapping the fundus of the stomach. Gas seen elsewhere along loops of bowel. IMPRESSION: Enteric tube in place with tip overlying the fundus of the stomach Electronically Signed   By: Ranell Bring M.D.   On: 08/25/2023 12:07   DG Chest Port 1 View Result Date: 08/19/2023 CLINICAL DATA:  Respiratory failure.  Post intubation. EXAM: PORTABLE CHEST 1 VIEW COMPARISON:  08/17/2023 FINDINGS: The endotracheal tube is high in positioning above the clavicles, 8.9 cm from the carina. Advancement of 4.5 cm would place the tip at the level of the clavicular heads. Enteric tube tip below the diaphragm not included in the field of view. Slightly improved lung volumes from prior exam, however new multifocal patchy airspace disease in the right perihilar region and both lung bases. The heart is normal in size. There may be small pleural effusions. No pneumothorax. Subacute right clavicle fracture. IMPRESSION: 1. High positioning of the endotracheal tube above the clavicles, 8.9 cm from the carina. Advancement of 4.5 cm would place the tip at the level of the clavicular heads. 2. New multifocal patchy airspace disease in the right perihilar region and both lung bases, suspicious for multifocal pneumonia. Aspiration is considered. 3. Possible small pleural effusions. Electronically Signed   By: Andrea Gasman M.D.   On: 08/19/2023 15:24   US  EKG SITE RITE Result Date: 08/19/2023 If Site Rite image not attached, placement could not be confirmed due to current cardiac rhythm.  DG Abd 1 View Result Date:  08/18/2023 CLINICAL DATA:  NG tube placement EXAM: ABDOMEN - 1 VIEW limited for tube placement COMPARISON:  X-ray earlier 08/18/2023 FINDINGS: Enteric tube in place with tip overlying the distal stomach. Minimal bowel gas elsewhere in the visualized upper abdomen. Motion. Overlapping cardiac leads. IMPRESSION: Limited x-ray for tube placement has enteric tube with tip overlying the distal stomach. Electronically Signed   By: Ranell Bring M.D.   On: 08/18/2023 11:06  DG Abd 1 View Result Date: 08/18/2023 CLINICAL DATA:  NG tube placement EXAM: ABDOMEN - 1 VIEW COMPARISON:  Pelvis x-ray 06/30/2023. FINDINGS: Gas is seen in nondilated loops of small large bowel. No obstruction. Scattered stool. Air is seen in the stomach. No definite free air on this portable semi upright view of the upper abdomen. No enteric tube identified on this x-ray. Please see follow up x-ray IMPRESSION: Nonspecific bowel gas pattern. No enteric tube identified. Please see follow-up x-ray from same day Electronically Signed   By: Ranell Bring M.D.   On: 08/18/2023 11:05   ECHOCARDIOGRAM COMPLETE Result Date: 08/17/2023    ECHOCARDIOGRAM REPORT   Patient Name:   Andrea Sanford Date of Exam: 08/17/2023 Medical Rec #:  [id number]          Height:       67.0 in Accession #:    [id number]         Weight:       191.6 lb Date of Birth:  August 10, 1970          BSA:          1.986 m Patient Age:    53 years           BP:           102/63 mmHg Patient Gender: F                  HR:           68 bpm. Exam Location:  [facility] Procedure: 2D Echo, Cardiac Doppler and Color Doppler Indications:     Cardiomyopathy --unspecified I42.9  History:         Patient has no prior history of Echocardiogram examinations.                  COPD; Risk Factors:Hypertension and Diabetes.  Sonographer:     Christopher Furnace Referring Phys:  [id number] BRITTON L RUST-CHESTER Diagnosing Phys: Annalee Custovic  Sonographer Comments: Echo performed with patient supine and on artificial  respirator. IMPRESSIONS  1. Left ventricular ejection fraction, by estimation, is 60 to 65%. The left ventricle has normal function. The left ventricle has no regional wall motion abnormalities. Left ventricular diastolic parameters were normal.  2. Right ventricular systolic function is normal. The right ventricular size is normal.  3. The mitral valve is normal in structure. No evidence of mitral valve regurgitation. No evidence of mitral stenosis.  4. The aortic valve is normal in structure. Aortic valve regurgitation is not visualized. No aortic stenosis is present.  5. The inferior vena cava is normal in size with greater than 50% respiratory variability, suggesting right atrial pressure of 3 mmHg. FINDINGS  Left Ventricle: Left ventricular ejection fraction, by estimation, is 60 to 65%. The left ventricle has normal function. The left ventricle has no regional wall motion abnormalities. The left ventricular internal cavity size was normal in size. There is  no left ventricular hypertrophy. Left ventricular diastolic parameters were normal. Right Ventricle: The right ventricular size is normal. No increase in right ventricular wall thickness. Right ventricular systolic function is normal. Left Atrium: Left atrial size was normal in size. Right Atrium: Right atrial size was normal in size. Pericardium: There is no evidence of pericardial effusion. Mitral Valve: The mitral valve is normal in structure. No evidence of mitral valve regurgitation. No evidence of mitral valve stenosis. MV peak gradient, 3.7 mmHg. The mean mitral valve gradient is 2.0 mmHg. Tricuspid Valve: The tricuspid  valve is normal in structure. Tricuspid valve regurgitation is not demonstrated. Aortic Valve: The aortic valve is normal in structure. Aortic valve regurgitation is not visualized. No aortic stenosis is present. Aortic valve mean gradient measures 3.0 mmHg. Aortic valve peak gradient measures 4.8 mmHg. Aortic valve area, by VTI  measures 3.98 cm. Pulmonic Valve: The pulmonic valve was normal in structure. Pulmonic valve regurgitation is not visualized. Aorta: The aortic root is normal in size and structure. Venous: The inferior vena cava is normal in size with greater than 50% respiratory variability, suggesting right atrial pressure of 3 mmHg. IAS/Shunts: No atrial level shunt detected by color flow Doppler.  LEFT VENTRICLE PLAX 2D LVIDd:         3.70 cm   Diastology LVIDs:         2.10 cm   LV e' medial:    8.27 cm/s LV PW:         1.50 cm   LV E/e' medial:  9.5 LV IVS:        1.80 cm   LV e' lateral:   10.40 cm/s LVOT diam:     2.20 cm   LV E/e' lateral: 7.6 LV SV:         68 LV SV Index:   34 LVOT Area:     3.80 cm  RIGHT VENTRICLE RV Basal diam:  3.10 cm RV Mid diam:    3.30 cm LEFT ATRIUM           Index        RIGHT ATRIUM           Index LA diam:      3.00 cm 1.51 cm/m   RA Area:     15.10 cm LA Vol (A2C): 26.8 ml 13.50 ml/m  RA Volume:   30.40 ml  15.31 ml/m LA Vol (A4C): 23.9 ml 12.04 ml/m  AORTIC VALVE AV Area (Vmax):    3.59 cm AV Area (Vmean):   3.54 cm AV Area (VTI):     3.98 cm AV Vmax:           109.00 cm/s AV Vmean:          74.700 cm/s AV VTI:            0.172 m AV Peak Grad:      4.8 mmHg AV Mean Grad:      3.0 mmHg LVOT Vmax:         103.00 cm/s LVOT Vmean:        69.600 cm/s LVOT VTI:          0.180 m LVOT/AV VTI ratio: 1.05  AORTA Ao Root diam: 3.40 cm MITRAL VALVE               TRICUSPID VALVE MV Area (PHT): 2.50 cm    TR Peak grad:   13.8 mmHg MV Area VTI:   2.37 cm    TR Vmax:        186.00 cm/s MV Peak grad:  3.7 mmHg MV Mean grad:  2.0 mmHg    SHUNTS MV Vmax:       0.97 m/s    Systemic VTI:  0.18 m MV Vmean:      60.3 cm/s   Systemic Diam: 2.20 cm MV Decel Time: 303 msec MV E velocity: 78.60 cm/s MV A velocity: 57.10 cm/s MV E/A ratio:  1.38 Designer, Multimedia signed by Annalee Casa Signature Date/Time: 08/17/2023/1:06:02 PM    Final  CT Head Wo Contrast Result Date:  08/17/2023 CLINICAL DATA:  53 year old female became unresponsive. Continued altered mental status status post Narcan . EXAM: CT HEAD WITHOUT CONTRAST TECHNIQUE: Contiguous axial images were obtained from the base of the skull through the vertex without intravenous contrast. RADIATION DOSE REDUCTION: This exam was performed according to the departmental dose-optimization program which includes automated exposure control, adjustment of the mA and/or kV according to patient size and/or use of iterative reconstruction technique. COMPARISON:  Head CT 09/14/2017. FINDINGS: Brain: Cerebral volume remains normal. No midline shift, ventriculomegaly, mass effect, evidence of mass lesion, intracranial hemorrhage or evidence of cortically based acute infarction. Occasional subcortical white matter hypodensity appears progressed since 2019, but overall gray-white differentiation is maintained throughout. Sulci are maintained. Basilar cisterns are normal. Vascular: No suspicious intracranial vascular hyperdensity. Faint Calcified atherosclerosis at the skull base. Skull: Stable visible osseous structures including chronic left maxilla and nasal bone fractures. Calvarium appears stable and intact. No acute osseous abnormality identified. Sinuses/Orbits: Chronic maxillary sinus mucosal thickening and/or retention cysts not significantly changed. Minimal other sinus mucosal thickening and/or fluid in the setting of intubation. Tympanic cavities and mastoids remain clear. Other: Intubated. Partially visible oral enteric tube. Small volume fluid in the pharynx. No acute orbit or scalp soft tissue finding. IMPRESSION: 1. No acute intracranial abnormality identified. Mild but progressed cerebral white matter changes since 2019, most commonly due to small vessel disease. 2. Intubated. 3. Chronic left facial fractures. Electronically Signed   By: VEAR Hurst M.D.   On: 08/17/2023 05:46   DG Chest Port 1 View Result Date:  08/17/2023 CLINICAL DATA:  Apnea.  Altered mental status. EXAM: PORTABLE CHEST 1 VIEW COMPARISON:  12/04/2011 FINDINGS: Endotracheal tube tip is approximately 2.6 cm above the base of the carina. The NG tube passes into the stomach although the distal tip position is not included on the film. The cardio pericardial silhouette is enlarged. Low volume film with vascular congestion. No focal airspace consolidation or substantial pleural effusion. Telemetry leads overlie the chest. Large gastric bubble noted despite the presence of an NG tube. IMPRESSION: 1. Low volume film with vascular congestion. 2. Large gastric bubble despite the presence of an NG tube. Electronically Signed   By: Camellia Candle M.D.   On: 08/17/2023 05:36    Labs:  CBC: Recent Labs    08/26/23 0433 08/27/23 0522 08/28/23 0514 08/29/23 0429  WBC 7.8 6.4 6.6 9.3  HGB 11.2* 11.3* 10.3* 11.5*  HCT 35.1* 35.4* 32.5* 35.6*  PLT 164 217 209 219    COAGS: No results for input(s): INR, APTT in the last 8760 hours.  BMP: Recent Labs    08/26/23 0433 08/27/23 0522 08/28/23 0514 08/29/23 0429  NA 135 139 139 137  K 3.5 4.3 3.7 4.1  CL 98 103 105 103  CO2 23 26 27 26   GLUCOSE 207* 154* 211* 269*  BUN 23* 35* 29* 26*  CALCIUM  8.0* 8.7* 7.5* 8.5*  CREATININE 0.79 0.90 0.64 0.64  GFRNONAA >60 >60 >60 >60    LIVER FUNCTION TESTS: Recent Labs    06/13/23 1440 08/17/23 0415 08/17/23 0511 08/19/23 0324 08/25/23 0434 08/26/23 0433 08/27/23 0522 08/28/23 0514  BILITOT 0.3 0.5  --   --   --   --   --   --   AST 22 19  --   --   --   --   --   --   ALT 12 QUANTITY NOT SUFFICIENT, UNABLE TO PERFORM TEST 11  --   --   --   --   --  ALKPHOS 123* 81  --   --   --   --   --   --   PROT 7.9 7.2  --   --   --   --   --   --   ALBUMIN 4.3 3.7  --    < > 2.7* 2.7* 2.6* 2.3*   < > = values in this interval not displayed.    TUMOR MARKERS: No results for input(s): AFPTM, CEA, CA199, CHROMGRNA in the last 8760  hours.  Assessment and Plan:  53 y.o. female who, per chart review, has a history of T2DM, HTN, Bipolar disorder, anxiety, depression, alcohol use, and polysubstance abuse who initially presented to the ED via EMS on 08/17/23 after being found unresponsive in a personal vehicle with an acquaintance. Found to have acute metabolic encephalopathy and acute hypoxic respiratory failure secondary to drug OD, alcohol intoxication, aspiration, and staph aureus pneumonia. Patient has required intubation and mechanical ventilation since admission. She has failed extubation twice and ENT has been consulted for possible tracheostomy placement. Intermittently receiving tube feeds. IR consulted for possible gastrostomy tube placement. Images and case reviewed by Dr. JINNY Hall.  -Contrast to be administered per NG tube this evening at 6pm -KUB and labs for early morning -Hold tube feeds at midnight -Hold SQ Heparin  for 2200 on 08/29/23 and 0600 on 08/30/23 -Plan for G-tube placement on 08/30/23 with Dr. JONETTA Faes   Risks and benefits image guided gastrostomy tube placement was discussed with the patient including, but not limited to the need for a barium enema during the procedure, bleeding, infection, peritonitis and/or damage to adjacent structures.  All of the patient's questions were answered, patient is agreeable to proceed.  Consent signed and in IR.   Thank you for this interesting consult. I look forward to participating in the care of Andrea Sanford. A copy of this report was sent to the requesting provider on this date.  Electronically Signed: Con Arganbright M Krishon Adkison, PA-C 08/29/2023, 11:11 AM   I spent a total of 40 Minutesin face to face clinical consultation, greater than 50% of which was counseling/coordinating care for gastrostomy tube placement.

## 2023-08-30 ENCOUNTER — Other Ambulatory Visit: Payer: Self-pay | Admitting: Radiology

## 2023-08-30 ENCOUNTER — Inpatient Hospital Stay: Payer: Medicaid Other

## 2023-08-30 DIAGNOSIS — J690 Pneumonitis due to inhalation of food and vomit: Secondary | ICD-10-CM

## 2023-08-30 DIAGNOSIS — J9601 Acute respiratory failure with hypoxia: Secondary | ICD-10-CM | POA: Diagnosis not present

## 2023-08-30 DIAGNOSIS — J15211 Pneumonia due to Methicillin susceptible Staphylococcus aureus: Secondary | ICD-10-CM | POA: Diagnosis not present

## 2023-08-30 DIAGNOSIS — E119 Type 2 diabetes mellitus without complications: Secondary | ICD-10-CM | POA: Diagnosis not present

## 2023-08-30 DIAGNOSIS — J189 Pneumonia, unspecified organism: Secondary | ICD-10-CM

## 2023-08-30 LAB — CBC
HCT: 32.5 % — ABNORMAL LOW (ref 36.0–46.0)
Hemoglobin: 10.6 g/dL — ABNORMAL LOW (ref 12.0–15.0)
MCH: 29.6 pg (ref 26.0–34.0)
MCHC: 32.6 g/dL (ref 30.0–36.0)
MCV: 90.8 fL (ref 80.0–100.0)
Platelets: 228 10*3/uL (ref 150–400)
RBC: 3.58 MIL/uL — ABNORMAL LOW (ref 3.87–5.11)
RDW: 12.3 % (ref 11.5–15.5)
WBC: 10.6 10*3/uL — ABNORMAL HIGH (ref 4.0–10.5)
nRBC: 0 % (ref 0.0–0.2)

## 2023-08-30 LAB — BASIC METABOLIC PANEL
Anion gap: 8 (ref 5–15)
BUN: 26 mg/dL — ABNORMAL HIGH (ref 6–20)
CO2: 29 mmol/L (ref 22–32)
Calcium: 8.7 mg/dL — ABNORMAL LOW (ref 8.9–10.3)
Chloride: 99 mmol/L (ref 98–111)
Creatinine, Ser: 0.63 mg/dL (ref 0.44–1.00)
GFR, Estimated: >60 mL/min (ref >60)
Glucose, Bld: 207 mg/dL — ABNORMAL HIGH (ref 70–99)
Potassium: 4.1 mmol/L (ref 3.5–5.1)
Sodium: 136 mmol/L (ref 135–145)

## 2023-08-30 LAB — PHOSPHORUS: Phosphorus: 3.8 mg/dL (ref 2.5–4.6)

## 2023-08-30 LAB — GLUCOSE, CAPILLARY
Glucose-Capillary: 159 mg/dL — ABNORMAL HIGH (ref 70–99)
Glucose-Capillary: 155 mg/dL — ABNORMAL HIGH (ref 70–99)
Glucose-Capillary: 121 mg/dL — ABNORMAL HIGH (ref 70–99)
Glucose-Capillary: 169 mg/dL — ABNORMAL HIGH (ref 70–99)
Glucose-Capillary: 140 mg/dL — ABNORMAL HIGH (ref 70–99)
Glucose-Capillary: 191 mg/dL — ABNORMAL HIGH (ref 70–99)

## 2023-08-30 LAB — MAGNESIUM: Magnesium: 1.8 mg/dL (ref 1.7–2.4)

## 2023-08-30 LAB — PROTIME-INR
INR: 1.2 (ref 0.8–1.2)
Prothrombin Time: 15.2 s (ref 11.4–15.2)

## 2023-08-30 MED ORDER — ORAL CARE MOUTH RINSE: 15.0000 mL | OROMUCOSAL | Status: DC | PRN

## 2023-08-30 MED ORDER — ORAL CARE MOUTH RINSE
15.0000 mL | OROMUCOSAL | Status: DC
  Administered 2023-08-30 – 2023-09-01 (×25): 15 mL via OROMUCOSAL

## 2023-08-30 MED ORDER — MAGNESIUM SULFATE 2 GM/50ML IV SOLN
2.0000 g | Freq: Once | INTRAVENOUS | Status: AC
  Administered 2023-08-30: 2 g via INTRAVENOUS
  Filled 2023-08-30: qty 50

## 2023-08-30 MED ORDER — ENOXAPARIN SODIUM 40 MG/0.4ML IJ SOSY: 40.0000 mg | PREFILLED_SYRINGE | INTRAMUSCULAR | Status: DC

## 2023-08-30 MED ORDER — QUETIAPINE FUMARATE 100 MG PO TABS
400.0000 mg | ORAL_TABLET | Freq: Three times a day (TID) | ORAL | Status: DC
Start: 2023-08-30 — End: 2023-09-06
  Administered 2023-08-30 – 2023-09-06 (×21): 400 mg
  Filled 2023-08-30 (×12): qty 4
  Filled 2023-08-30: qty 16
  Filled 2023-08-30 (×9): qty 4

## 2023-08-30 MED ORDER — HEPARIN SODIUM (PORCINE) 5000 UNIT/ML IJ SOLN
5000.0000 [IU] | Freq: Two times a day (BID) | INTRAMUSCULAR | Status: DC
  Administered 2023-08-30 (×2): 5000 [IU] via SUBCUTANEOUS
  Filled 2023-08-30 (×2): qty 1

## 2023-08-30 MED ORDER — OXYCODONE HCL 5 MG PO TABS
10.0000 mg | ORAL_TABLET | Freq: Four times a day (QID) | ORAL | Status: DC
  Administered 2023-08-30 – 2023-09-01 (×8): 10 mg
  Filled 2023-08-30 (×8): qty 2

## 2023-08-30 NOTE — Progress Notes (Signed)
 Patients foley removed at 4am 08/30/23, bladder scan preformed at 10:30 revealed 150, bladder scan preformed at 13:15 revealed 290, bladder scan preformed at 16:30 revealed 450. In and out preformed and of amber clear urine drained from bladder. Andrea Sanford assited with in and out as per policy and procedure.
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
## 2023-08-30 NOTE — Plan of Care (Signed)
  Problem: Education: Goal: Ability to describe self-care measures that may prevent or decrease complications (Diabetes Survival Skills Education) will improve Outcome: Not Progressing   Problem: Coping: Goal: Ability to adjust to condition or change in health will improve Outcome: Not Progressing   Problem: Fluid Volume: Goal: Ability to maintain a balanced intake and output will improve Outcome: Not Progressing   Problem: Health Behavior/Discharge Planning: Goal: Ability to identify and utilize available resources and services will improve Outcome: Not Progressing Goal: Ability to manage health-related needs will improve Outcome: Not Progressing   Problem: Metabolic: Goal: Ability to maintain appropriate glucose levels will improve Outcome: Not Progressing   Problem: Nutritional: Goal: Maintenance of adequate nutrition will improve Outcome: Not Progressing Goal: Progress toward achieving an optimal weight will improve Outcome: Not Progressing   Problem: Skin Integrity: Goal: Risk for impaired skin integrity will decrease Outcome: Not Progressing   Problem: Tissue Perfusion: Goal: Adequacy of tissue perfusion will improve Outcome: Not Progressing   Problem: Education: Goal: Knowledge of General Education information will improve Description: Including pain rating scale, medication(s)/side effects and non-pharmacologic comfort measures Outcome: Not Progressing   Problem: Health Behavior/Discharge Planning: Goal: Ability to manage health-related needs will improve Outcome: Not Progressing   Problem: Clinical Measurements: Goal: Ability to maintain clinical measurements within normal limits will improve Outcome: Not Progressing Goal: Will remain free from infection Outcome: Not Progressing Goal: Diagnostic test results will improve Outcome: Not Progressing Goal: Respiratory complications will improve Outcome: Not Progressing Goal: Cardiovascular complication will  be avoided Outcome: Not Progressing   Problem: Activity: Goal: Risk for activity intolerance will decrease Outcome: Not Progressing   Problem: Nutrition: Goal: Adequate nutrition will be maintained Outcome: Not Progressing   Problem: Coping: Goal: Level of anxiety will decrease Outcome: Not Progressing   Problem: Elimination: Goal: Will not experience complications related to bowel motility Outcome: Not Progressing Goal: Will not experience complications related to urinary retention Outcome: Not Progressing   Problem: Pain Managment: Goal: General experience of comfort will improve and/or be controlled Outcome: Not Progressing   Problem: Safety: Goal: Ability to remain free from injury will improve Outcome: Not Progressing   Problem: Skin Integrity: Goal: Risk for impaired skin integrity will decrease Outcome: Not Progressing   Problem: Education: Goal: Knowledge of disease or condition will improve Outcome: Not Progressing Goal: Knowledge of the prescribed therapeutic regimen will improve Outcome: Not Progressing   Problem: Activity: Goal: Ability to tolerate increased activity will improve Outcome: Not Progressing Goal: Will verbalize the importance of balancing activity with adequate rest periods Outcome: Not Progressing   Problem: Respiratory: Goal: Ability to maintain a clear airway will improve Outcome: Not Progressing Goal: Levels of oxygenation will improve Outcome: Not Progressing Goal: Ability to maintain adequate ventilation will improve Outcome: Not Progressing   Problem: Activity: Goal: Ability to tolerate increased activity will improve Outcome: Not Progressing   Problem: Respiratory: Goal: Ability to maintain a clear airway and adequate ventilation will improve Outcome: Not Progressing   Problem: Role Relationship: Goal: Method of communication will improve Outcome: Not Progressing

## 2023-08-30 NOTE — Consult Note (Addendum)
 PHARMACY CONSULT NOTE - ELECTROLYTES  Pharmacy Consult for Electrolyte Monitoring and Replacement   Recent Labs: Potassium (mmol/L)  Date Value  08/30/2023 4.1  10/15/2012 3.0 (L)   Magnesium  (mg/dL)  Date Value  97/94/7974 1.8  10/04/2011 1.4 (L)   Calcium  (mg/dL)  Date Value  97/94/7974 8.7 (L)   Calcium , Total (mg/dL)  Date Value  96/75/7985 8.3 (L)   Albumin (g/dL)  Date Value  97/96/7974 2.3 (L)  06/13/2023 4.3  10/15/2012 3.9   Phosphorus (mg/dL)  Date Value  97/94/7974 3.8   Sodium (mmol/L)  Date Value  08/30/2023 136  06/13/2023 142  10/15/2012 141   Height: 5' 6 (167.6 cm) Weight: 89.2 kg (196 lb 10.4 oz) IBW/kg (Calculated) : 59.3 Estimated Creatinine Clearance: 90.5 mL/min (by C-G formula based on SCr of 0.63 mg/dL).  Assessment  Andrea Sanford is a 53 y.o. female presenting with drug overdose / intoxication. PMH significant for polysubstance abuse, anxiety / depression, asthma / COPD, bipolar disorder, DM, neuromuscular disorder. Pharmacy has been consulted to monitor and replace electrolytes.  Goal of Therapy: Electrolytes within normal limits  Plan:  --Mg 1.8 magnesium  sulfate 2 g IV x 1 --Follow-up electrolytes with AM labs tomorrow. Labs have been stable, can consider decreasing checks to [phone]  Thank you for allowing pharmacy to be a part of this patient's care.  Marolyn KATHEE Mare 08/30/2023 8:50 AM

## 2023-08-30 NOTE — Progress Notes (Signed)
 Patient tentatively planned for G tube placement today in IR however after discussion by IR RN with ICU RN there are concerns regarding patient's airway and high pressor requirements. She is planned to have a tracheostomy placed on Friday 2/7.  Will hold on G tube placement today and tentatively plan for procedure Monday 2/10 after tracheostomy placement and review of patient clinical status by IR team that day.  Plan: - May resume tube feeds/heparin  today - Hold tube feeds midnight 2/10 - Hold heparin  SQ AM 2/10 (order placed in MAR) - CBC/INR AM of 2/10 - Ancef  2g signed and held. This is to be given in IR during the procedure - please do not give on the floor.  Please call/secure chat with questions or concerns.  Clotilda Hesselbach, PA-C

## 2023-08-31 DIAGNOSIS — G928 Other toxic encephalopathy: Secondary | ICD-10-CM | POA: Diagnosis not present

## 2023-08-31 DIAGNOSIS — F10920 Alcohol use, unspecified with intoxication, uncomplicated: Secondary | ICD-10-CM | POA: Diagnosis not present

## 2023-08-31 DIAGNOSIS — J9601 Acute respiratory failure with hypoxia: Secondary | ICD-10-CM | POA: Diagnosis not present

## 2023-08-31 DIAGNOSIS — J690 Pneumonitis due to inhalation of food and vomit: Secondary | ICD-10-CM | POA: Diagnosis not present

## 2023-08-31 LAB — GLUCOSE, CAPILLARY
Glucose-Capillary: 148 mg/dL — ABNORMAL HIGH (ref 70–99)
Glucose-Capillary: 150 mg/dL — ABNORMAL HIGH (ref 70–99)
Glucose-Capillary: 284 mg/dL — ABNORMAL HIGH (ref 70–99)
Glucose-Capillary: 297 mg/dL — ABNORMAL HIGH (ref 70–99)
Glucose-Capillary: 78 mg/dL (ref 70–99)
Glucose-Capillary: 97 mg/dL (ref 70–99)

## 2023-08-31 LAB — CBC
HCT: 30.2 % — ABNORMAL LOW (ref 36.0–46.0)
Hemoglobin: 9.7 g/dL — ABNORMAL LOW (ref 12.0–15.0)
MCH: 29.2 pg (ref 26.0–34.0)
MCHC: 32.1 g/dL (ref 30.0–36.0)
MCV: 91 fL (ref 80.0–100.0)
Platelets: 210 10*3/uL (ref 150–400)
RBC: 3.32 MIL/uL — ABNORMAL LOW (ref 3.87–5.11)
RDW: 12.3 % (ref 11.5–15.5)
WBC: 8.3 10*3/uL (ref 4.0–10.5)
nRBC: 0 % (ref 0.0–0.2)

## 2023-08-31 LAB — BASIC METABOLIC PANEL
Anion gap: 5 (ref 5–15)
BUN: 20 mg/dL (ref 6–20)
CO2: 29 mmol/L (ref 22–32)
Calcium: 8.2 mg/dL — ABNORMAL LOW (ref 8.9–10.3)
Chloride: 100 mmol/L (ref 98–111)
Creatinine, Ser: 0.58 mg/dL (ref 0.44–1.00)
GFR, Estimated: >60 mL/min (ref >60)
Glucose, Bld: 237 mg/dL — ABNORMAL HIGH (ref 70–99)
Potassium: 3.4 mmol/L — ABNORMAL LOW (ref 3.5–5.1)
Sodium: 134 mmol/L — ABNORMAL LOW (ref 135–145)

## 2023-08-31 LAB — MAGNESIUM: Magnesium: 1.9 mg/dL (ref 1.7–2.4)

## 2023-08-31 LAB — POTASSIUM: Potassium: 4 mmol/L (ref 3.5–5.1)

## 2023-08-31 LAB — PHOSPHORUS: Phosphorus: 3.9 mg/dL (ref 2.5–4.6)

## 2023-08-31 MED ORDER — POTASSIUM CHLORIDE 20 MEQ PO PACK
40.0000 meq | PACK | Freq: Once | ORAL | Status: AC
  Administered 2023-08-31: 40 meq
  Filled 2023-08-31: qty 2

## 2023-08-31 MED ORDER — DEXTROSE 5 % IV SOLN: INTRAVENOUS | Status: DC

## 2023-08-31 NOTE — Inpatient Diabetes Management (Signed)
 Inpatient Diabetes Program Recommendations  AACE/ADA: New Consensus Statement on Inpatient Glycemic Control (2015)  Target Ranges:  Prepandial:   less than 140 mg/dL      Peak postprandial:   less than 180 mg/dL (1-2 hours)      Critically ill patients:  140 - 180 mg/dL    Latest Reference Range & Units 08/30/23 23:28 08/31/23 04:07 08/31/23 07:57 08/31/23 11:41  Glucose-Capillary 70 - 99 mg/dL 808 (H) 849 (H) 715 (H) 297 (H)  (H): Data is abnormally high     Current Orders: Novolog  Resistant Correction Scale/ SSI (0-20 units) Q4 hours      MD- Note rise in CBGs today.  Pt getting Tube Feeds 60cc/hr.    If this trend continues, may consider adding Novolog  Tube Feed Coverage:   Novolog  3 units Q4 hours HOLD if tube feeds HELD for any reason    --Will follow patient during hospitalization--  Adina Rudolpho Arrow RN, MSN, CDCES Diabetes Coordinator Inpatient Glycemic Control Team Team Pager: [phone] (8a-5p)

## 2023-08-31 NOTE — Consult Note (Signed)
 PHARMACY CONSULT NOTE - ELECTROLYTES  Pharmacy Consult for Electrolyte Monitoring and Replacement   Recent Labs: Potassium (mmol/L)  Date Value  08/31/2023 3.4 (L)  10/15/2012 3.0 (L)   Magnesium  (mg/dL)  Date Value  97/93/7974 1.9  10/04/2011 1.4 (L)   Calcium  (mg/dL)  Date Value  97/93/7974 8.2 (L)   Calcium , Total (mg/dL)  Date Value  96/75/7985 8.3 (L)   Albumin (g/dL)  Date Value  97/96/7974 2.3 (L)  06/13/2023 4.3  10/15/2012 3.9   Phosphorus (mg/dL)  Date Value  97/93/7974 3.9   Sodium (mmol/L)  Date Value  08/31/2023 134 (L)  06/13/2023 142  10/15/2012 141   Height: 5' 6 (167.6 cm) Weight: 89.8 kg (197 lb 15.6 oz) IBW/kg (Calculated) : 59.3 Estimated Creatinine Clearance: 90.7 mL/min (by C-G formula based on SCr of 0.58 mg/dL).  Assessment  Andrea Sanford is a 53 y.o. female presenting with drug overdose / intoxication. PMH significant for polysubstance abuse, anxiety / depression, asthma / COPD, bipolar disorder, DM, neuromuscular disorder. Pharmacy has been consulted to monitor and replace electrolytes.  Goal of Therapy: Electrolytes within normal limits  Plan:  --K 3.4: Kcl via tube x 1 --Follow-up electrolytes with AM labs tomorrow  Thank you for allowing pharmacy to be a part of this patient's care.  Takeru Bose A Almer Littleton 08/31/2023 1:10 PM
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
## 2023-08-31 NOTE — Anesthesia Preprocedure Evaluation (Addendum)
 Anesthesia Evaluation  Patient identified by MRN, date of birth, ID band Patient unresponsive    Reviewed: Allergy & Precautions, NPO status , Patient's Chart, lab work & pertinent test results  History of Anesthesia Complications Negative for: history of anesthetic complications  Airway Mallampati: Intubated  TM Distance: <3 FB     Dental  (+) Chipped   Pulmonary asthma , sleep apnea , pneumonia, COPD, Current Smoker    + decreased breath sounds      Cardiovascular hypertension, Normal cardiovascular exam     Neuro/Psych  PSYCHIATRIC DISORDERS       Neuromuscular disease    GI/Hepatic negative GI ROS, Neg liver ROS,,,  Endo/Other  diabetes    Renal/GU      Musculoskeletal   Abdominal   Peds  Hematology negative hematology ROS (+)   Anesthesia Other Findings 53 y.o. female admitted with Acute Metabolic Encephalopathy and Acute Hypoxic Respiratory Failure secondary to drug overdose of undetermined intent (UDS positive for cocaine, marijuana & amphetamines), alcohol intoxication, Aspiration, and Staph Aureus pneumonia requiring intubation and mechanical ventilation.  Hospital course complicated by development of Delirium Tremens.  Failed multiple trials of extubation  Past Medical History: No date: Allergy No date: Anxiety No date: Arthritis No date: Asthma No date: Bipolar 1 disorder (HCC) No date: Cancer (HCC) No date: COPD (chronic obstructive pulmonary disease) (HCC) No date: Depression No date: Diabetes mellitus without complication (HCC) No date: Hypertension No date: Neuromuscular disorder (HCC) No date: Osteoporosis No date: Sleep apnea  Past Surgical History: No date: ANKLE SURGERY; Right 1991: PERIPHERAL VASCULAR THROMBECTOMY; Left  BMI    Body Mass Index: 31.95 kg/m      Reproductive/Obstetrics negative OB ROS                             Anesthesia  Physical Anesthesia Plan  ASA: 4  Anesthesia Plan: General ETT   Post-op Pain Management:    Induction: Intravenous  PONV Risk Score and Plan: Ondansetron , Dexamethasone , Midazolam  and Treatment may vary due to age or medical condition  Airway Management Planned: Oral ETT  Additional Equipment:   Intra-op Plan:   Post-operative Plan: Post-operative intubation/ventilation  Informed Consent: I have reviewed the patients History and Physical, chart, labs and discussed the procedure including the risks, benefits and alternatives for the proposed anesthesia with the patient or authorized representative who has indicated his/her understanding and acceptance.     Dental Advisory Given  Plan Discussed with: Anesthesiologist, CRNA and Surgeon  Anesthesia Plan Comments: (History and consent from the patients mother Roderick Cheese at [phone]  Mother consented for risks of anesthesia including but not limited to:  - adverse reactions to medications - damage to eyes, teeth, lips or other oral mucosa - nerve damage due to positioning  - sore throat or hoarseness - Damage to heart, brain, nerves, lungs, other parts of body or loss of life  She voiced understanding and assent.)       Anesthesia Quick Evaluation

## 2023-09-01 ENCOUNTER — Inpatient Hospital Stay: Payer: Medicaid Other | Admitting: Anesthesiology

## 2023-09-01 ENCOUNTER — Encounter
Admission: EM | Discharge status: Against Medical Advice | Payer: Self-pay | Source: Home / Self Care | Attending: Student in an Organized Health Care Education/Training Program

## 2023-09-01 ENCOUNTER — Other Ambulatory Visit: Payer: Self-pay

## 2023-09-01 DIAGNOSIS — F10920 Alcohol use, unspecified with intoxication, uncomplicated: Secondary | ICD-10-CM | POA: Diagnosis not present

## 2023-09-01 DIAGNOSIS — J9601 Acute respiratory failure with hypoxia: Secondary | ICD-10-CM | POA: Diagnosis not present

## 2023-09-01 DIAGNOSIS — T50904A Poisoning by unspecified drugs, medicaments and biological substances, undetermined, initial encounter: Secondary | ICD-10-CM | POA: Diagnosis not present

## 2023-09-01 DIAGNOSIS — F1490 Cocaine use, unspecified, uncomplicated: Secondary | ICD-10-CM | POA: Diagnosis not present

## 2023-09-01 HISTORY — PX: TRACHEOSTOMY TUBE PLACEMENT: SHX814

## 2023-09-01 LAB — CBC WITH DIFFERENTIAL/PLATELET
Abs Immature Granulocytes: 0.04 10*3/uL (ref 0.00–0.07)
Basophils Absolute: 0.1 10*3/uL (ref 0.0–0.1)
Basophils Relative: 1 %
Eosinophils Absolute: 0.2 10*3/uL (ref 0.0–0.5)
Eosinophils Relative: 3 %
HCT: 31.7 % — ABNORMAL LOW (ref 36.0–46.0)
Hemoglobin: 10.5 g/dL — ABNORMAL LOW (ref 12.0–15.0)
Immature Granulocytes: 0 %
Lymphocytes Relative: 19 %
Lymphs Abs: 1.7 10*3/uL (ref 0.7–4.0)
MCH: 30.4 pg (ref 26.0–34.0)
MCHC: 33.1 g/dL (ref 30.0–36.0)
MCV: 91.9 fL (ref 80.0–100.0)
Monocytes Absolute: 0.6 10*3/uL (ref 0.1–1.0)
Monocytes Relative: 7 %
Neutro Abs: 6.3 10*3/uL (ref 1.7–7.7)
Neutrophils Relative %: 70 %
Platelets: 254 10*3/uL (ref 150–400)
RBC: 3.45 MIL/uL — ABNORMAL LOW (ref 3.87–5.11)
RDW: 12.3 % (ref 11.5–15.5)
WBC: 9 10*3/uL (ref 4.0–10.5)
nRBC: 0 % (ref 0.0–0.2)

## 2023-09-01 LAB — GLUCOSE, CAPILLARY
Glucose-Capillary: 132 mg/dL — ABNORMAL HIGH (ref 70–99)
Glucose-Capillary: 142 mg/dL — ABNORMAL HIGH (ref 70–99)
Glucose-Capillary: 110 mg/dL — ABNORMAL HIGH (ref 70–99)
Glucose-Capillary: 132 mg/dL — ABNORMAL HIGH (ref 70–99)
Glucose-Capillary: 159 mg/dL — ABNORMAL HIGH (ref 70–99)
Glucose-Capillary: 205 mg/dL — ABNORMAL HIGH (ref 70–99)
Glucose-Capillary: 118 mg/dL — ABNORMAL HIGH (ref 70–99)

## 2023-09-01 LAB — MAGNESIUM: Magnesium: 1.8 mg/dL (ref 1.7–2.4)

## 2023-09-01 LAB — BASIC METABOLIC PANEL
Anion gap: 10 (ref 5–15)
BUN: 18 mg/dL (ref 6–20)
CO2: 27 mmol/L (ref 22–32)
Calcium: 8.8 mg/dL — ABNORMAL LOW (ref 8.9–10.3)
Chloride: 99 mmol/L (ref 98–111)
Creatinine, Ser: 0.54 mg/dL (ref 0.44–1.00)
GFR, Estimated: >60 mL/min (ref >60)
Glucose, Bld: 162 mg/dL — ABNORMAL HIGH (ref 70–99)
Potassium: 3.9 mmol/L (ref 3.5–5.1)
Sodium: 136 mmol/L (ref 135–145)

## 2023-09-01 LAB — PHOSPHORUS: Phosphorus: 3.8 mg/dL (ref 2.5–4.6)

## 2023-09-01 SURGERY — CREATION, TRACHEOSTOMY
Anesthesia: General

## 2023-09-01 MED ORDER — LIDOCAINE-EPINEPHRINE 1 %-1:100000 IJ SOLN
INTRAMUSCULAR | Status: DC | PRN
  Administered 2023-09-01: 7 mL

## 2023-09-01 MED ORDER — PROPOFOL 10 MG/ML IV BOLUS
INTRAVENOUS | Status: AC
  Filled 2023-09-01: qty 20

## 2023-09-01 MED ORDER — LACTATED RINGERS IV SOLN: INTRAVENOUS | Status: DC | PRN

## 2023-09-01 MED ORDER — MIDAZOLAM HCL 2 MG/2ML IJ SOLN
INTRAMUSCULAR | Status: AC
  Filled 2023-09-01: qty 2

## 2023-09-01 MED ORDER — CEFAZOLIN SODIUM-DEXTROSE 2-4 GM/100ML-% IV SOLN: 2.0000 g | INTRAVENOUS | Status: DC

## 2023-09-01 MED ORDER — LIDOCAINE HCL (PF) 2 % IJ SOLN
INTRAMUSCULAR | Status: AC
  Filled 2023-09-01: qty 5

## 2023-09-01 MED ORDER — LIDOCAINE-EPINEPHRINE 1 %-1:100000 IJ SOLN
INTRAMUSCULAR | Status: AC
  Filled 2023-09-01: qty 1

## 2023-09-01 MED ORDER — OXYCODONE HCL 5 MG PO TABS
30.0000 mg | ORAL_TABLET | ORAL | Status: DC
  Administered 2023-09-01 – 2023-09-05 (×21): 30 mg
  Filled 2023-09-01 (×22): qty 6

## 2023-09-01 MED ORDER — ALTEPLASE 2 MG IJ SOLR
2.0000 mg | Freq: Once | INTRAMUSCULAR | Status: AC
  Administered 2023-09-01: 2 mg
  Filled 2023-09-01: qty 2

## 2023-09-01 MED ORDER — PHENYLEPHRINE HCL-NACL 20-0.9 MG/250ML-% IV SOLN
INTRAVENOUS | Status: AC
  Filled 2023-09-01: qty 250

## 2023-09-01 MED ORDER — MIDAZOLAM HCL 2 MG/2ML IJ SOLN
INTRAMUSCULAR | Status: DC | PRN
  Administered 2023-09-01: 2 mg via INTRAVENOUS

## 2023-09-01 MED ORDER — STERILE WATER FOR INJECTION IJ SOLN
INTRAMUSCULAR | Status: AC
  Administered 2023-09-01: 10 mL via INTRAVENOUS
  Filled 2023-09-01: qty 10

## 2023-09-01 MED ORDER — HEPARIN SODIUM (PORCINE) 5000 UNIT/ML IJ SOLN
5000.0000 [IU] | Freq: Three times a day (TID) | INTRAMUSCULAR | Status: DC
Start: 2023-09-01 — End: 2023-09-05
  Administered 2023-09-01 – 2023-09-05 (×9): 5000 [IU] via SUBCUTANEOUS
  Filled 2023-09-01 (×9): qty 1

## 2023-09-01 MED ORDER — NYSTATIN 100000 UNIT/ML MT SUSP
5.0000 mL | Freq: Four times a day (QID) | OROMUCOSAL | Status: DC
  Administered 2023-09-01 – 2023-09-04 (×13): 500000 [IU]
  Filled 2023-09-01 (×17): qty 5

## 2023-09-01 MED ORDER — PHENYLEPHRINE 80 MCG/ML (10ML) SYRINGE FOR IV PUSH (FOR BLOOD PRESSURE SUPPORT)
PREFILLED_SYRINGE | INTRAVENOUS | Status: DC | PRN
  Administered 2023-09-01: 80 ug via INTRAVENOUS
  Administered 2023-09-01: 160 ug via INTRAVENOUS

## 2023-09-01 MED ORDER — ROCURONIUM BROMIDE 10 MG/ML (PF) SYRINGE
PREFILLED_SYRINGE | INTRAVENOUS | Status: AC
  Filled 2023-09-01: qty 10

## 2023-09-01 SURGICAL SUPPLY — 29 items
ATTRACTOMAT 16X20 MAGNETIC DRP (DRAPES) ×1 IMPLANT
BLADE SURG 15 STRL LF DISP TIS (BLADE) ×1 IMPLANT
BLADE SURG SZ11 CARB STEEL (BLADE) ×1 IMPLANT
ELECT CAUTERY BLADE TIP 2.5 (TIP) ×1
ELECT REM PT RETURN 9FT ADLT (ELECTROSURGICAL) ×1
ELECTRODE CAUTERY BLDE TIP 2.5 (TIP) ×1 IMPLANT
ELECTRODE REM PT RTRN 9FT ADLT (ELECTROSURGICAL) ×1 IMPLANT
GAUZE 4X4 16PLY ~~LOC~~+RFID DBL (SPONGE) ×1 IMPLANT
GLOVE BIO SURGEON STRL SZ7.5 (GLOVE) ×1 IMPLANT
GOWN STRL REUS W/ TWL LRG LVL3 (GOWN DISPOSABLE) ×2 IMPLANT
HEMOSTAT SURGICEL 2X3 (HEMOSTASIS) IMPLANT
HLDR TRACH TUBE NECKBAND 18 (MISCELLANEOUS) ×1 IMPLANT
KIT TURNOVER KIT A (KITS) ×1 IMPLANT
LABEL OR SOLS (LABEL) ×1 IMPLANT
MANIFOLD NEPTUNE II (INSTRUMENTS) ×1 IMPLANT
NS IRRIG 500ML POUR BTL (IV SOLUTION) ×1 IMPLANT
PACK HEAD/NECK (MISCELLANEOUS) ×1 IMPLANT
SHEARS HARMONIC 9CM CVD (BLADE) ×1 IMPLANT
SPONGE DRAIN TRACH 4X4 STRL 2S (GAUZE/BANDAGES/DRESSINGS) ×1 IMPLANT
SPONGE KITTNER 5P (MISCELLANEOUS) ×1 IMPLANT
SUCTION TUBE FRAZIER 10FR DISP (SUCTIONS) IMPLANT
SUT ETHILON 2 0 FS 18 (SUTURE) ×1 IMPLANT
SUT SILK 2 0 SH (SUTURE) IMPLANT
SUT SILK 2-0 18XBRD TIE 12 (SUTURE) ×1 IMPLANT
SUT VIC AB 3-0 PS2 18XBRD (SUTURE) IMPLANT
SYR 10ML LL (SYRINGE) ×1 IMPLANT
TRAP FLUID SMOKE EVACUATOR (MISCELLANEOUS) ×1 IMPLANT
TUBE TRACH 6.0 CUFF FLEX (MISCELLANEOUS) IMPLANT
TUBE TRACH FLEX 8.5 CUFF (MISCELLANEOUS) IMPLANT

## 2023-09-01 NOTE — Progress Notes (Addendum)
 0725 To the OR with CRNA and OR nurses.Bagged in route by CRNA. 0825 Back from the OR. #8 shiley trach noted. 1230 Mother called. Updated on daughter's care. Questions answered.

## 2023-09-01 NOTE — Plan of Care (Signed)
  Problem: Fluid Volume: Goal: Ability to maintain a balanced intake and output will improve Outcome: Progressing   Problem: Metabolic: Goal: Ability to maintain appropriate glucose levels will improve Outcome: Not Progressing   Problem: Skin Integrity: Goal: Risk for impaired skin integrity will decrease Outcome: Progressing   Problem: Tissue Perfusion: Goal: Adequacy of tissue perfusion will improve Outcome: Progressing   Problem: Clinical Measurements: Goal: Ability to maintain clinical measurements within normal limits will improve Outcome: Progressing Goal: Will remain free from infection Outcome: Progressing

## 2023-09-01 NOTE — Transfer of Care (Signed)
 Immediate Anesthesia Transfer of Care Note  Patient: Andrea Sanford  Procedure(s) Performed: TRACHEOSTOMY  Patient Location: ICU  Anesthesia Type:General  Level of Consciousness: drowsy  Airway & Oxygen Therapy: Patient Spontanous Breathing and Patient connected to face mask oxygen  Post-op Assessment: Report given to RN, Post -op Vital signs reviewed and stable, and Patient moving all extremities X 4  Post vital signs: Reviewed and stable  Last Vitals:  Vitals Value Taken Time  BP 88/56   Temp    Pulse 65   Resp 12   SpO2 96     Last Pain:  Vitals:   09/01/23 0700  TempSrc: Axillary  PainSc:          Complications: No notable events documented.

## 2023-09-01 NOTE — Progress Notes (Signed)
 Patient ID: Andrea Sanford, female   DOB: 05-Oct-1970, 53 y.o.   MRN: [id number] Andrea, Sanford [id number] 1971-01-29 Andrea Sanford Dolly, MD   SUBJECTIVE: This 53 y.o. year old female is status post tracheostomy this AM. No issues reported by CCU staff.  Medications:  Current Facility-Administered Medications  Medication Dose Route Frequency Provider Last Rate Last Admin   acetaminophen  (TYLENOL ) tablet 650 mg  650 mg Per Tube Q4H PRN Rust-Chester, Britton L, NP   650 mg at 08/28/23 0414   [START ON 09/04/2023] ceFAZolin  (ANCEF ) IVPB 2g/100 mL premix  2 g Intravenous to [employer], Caitlyn M, PA-C       Chlorhexidine  Gluconate Cloth 2 % PADS 6 each  6 each Topical Daily Kasa, Kurian, MD   6 each at 09/01/23 0924   dexmedetomidine  (PRECEDEX ) 400 MCG/100ML (4 mcg/mL) infusion  0-1.2 mcg/kg/hr Intravenous Titrated Isadora Hose, MD 26.1 mL/hr at 09/01/23 1319 1.2 mcg/kg/hr at 09/01/23 1319   diazepam  (VALIUM ) tablet 10 mg  10 mg Per Tube BID Isadora Hose, MD   10 mg at 09/01/23 1119   docusate (COLACE) 50 MG/5ML liquid 100 mg  100 mg Per Tube BID PRN Rust-Chester, Jenita CROME, NP       feeding supplement (OSMOLITE 1.5 CAL) liquid 1,000 mL  1,000 mL Per Tube Continuous Aleskerov, Fuad, MD 60 mL/hr at 09/01/23 1436 1,000 mL at 09/01/23 1436   feeding supplement (PROSource TF20) liquid 60 mL  60 mL Per Tube Daily Kasa, Kurian, MD   60 mL at 09/01/23 1133   folic acid  (FOLVITE ) tablet 1 mg  1 mg Per Tube Daily Rust-Chester, Jenita CROME, NP   1 mg at 09/01/23 1118   free water  30 mL  30 mL Per Tube Q4H Kasa, Kurian, MD   30 mL at 09/01/23 1614   gabapentin  (NEURONTIN ) 250 MG/5ML solution 800 mg  800 mg Per Tube Q8H Aleskerov, Fuad, MD   800 mg at 09/01/23 1417   heparin  injection 5,000 Units  5,000 Units Subcutaneous Q8H Dgayli, Hose, MD       HYDROmorphone  (DILAUDID ) 50 mg in 50 mL NS (1mg /mL) premix infusion  0.5-4 mg/hr Intravenous Continuous Shellia Mann D, NP 0.5 mL/hr at 09/01/23 1613 0.5  mg/hr at 09/01/23 1613   HYDROmorphone  (DILAUDID ) bolus via infusion 0.25-2 mg  0.25-2 mg Intravenous Q30 min PRN Keene, Jeremiah D, NP   0.25 mg at 08/28/23 1315   insulin  aspart (novoLOG ) injection 0-20 Units  0-20 Units Subcutaneous Q4H Rust-Chester, Britton L, NP   3 Units at 09/01/23 1128   ipratropium-albuterol  (DUONEB) 0.5-2.5 (3) MG/3ML nebulizer solution 3 mL  3 mL Nebulization Q6H PRN Parris Manna, MD       norepinephrine  (LEVOPHED ) 4mg  in (0.016 mg/mL) premix infusion  0-40 mcg/min Intravenous Titrated Shellia Mann BIRCH, NP   Stopped at 08/30/23 0605   nutrition supplement (JUVEN) (JUVEN) powder packet 1 packet  1 packet Per Tube BID BM Aleskerov, Fuad, MD   1 packet at 09/01/23 1422   nystatin  (MYCOSTATIN ) 100000 UNIT/ML suspension 500,000 Units  5 mL Per Tube QID Dgayli, Khabib, MD   500,000 Units at 09/01/23 1127   ondansetron  (ZOFRAN ) injection 4 mg  4 mg Intravenous Q6H PRN Rust-Chester, Britton L, NP   4 mg at 08/23/23 2210   oxyCODONE  (Oxy IR/ROXICODONE ) immediate release tablet 30 mg  30 mg Per Tube Q4H Dgayli, Hose, MD   30 mg at 09/01/23 1612   pantoprazole  (PROTONIX ) injection 40 mg  40 mg  Intravenous Q24H Kasa, Kurian, MD   40 mg at 09/01/23 1408   polyethylene glycol (MIRALAX  / GLYCOLAX ) packet 17 g  17 g Per Tube Daily PRN Rust-Chester, Jenita CROME, NP       QUEtiapine  (SEROQUEL ) tablet 400 mg  400 mg Per Tube TID Isadora Hose, MD   400 mg at 09/01/23 1612   sodium chloride  flush (NS) 0.9 % injection 10-40 mL  10-40 mL Intracatheter Q12H Isaiah Scrivener, MD   10 mL at 09/01/23 1000   sodium chloride  flush (NS) 0.9 % injection 10-40 mL  10-40 mL Intracatheter PRN Kasa, Kurian, MD       sterile water  (preservative free) injection            thiamine  (VITAMIN B1) tablet 100 mg  100 mg Per Tube Daily Clair Rue B, RPH   100 mg at 09/01/23 1120   venlafaxine  (EFFEXOR ) tablet 75 mg  75 mg Per Tube BID Aleskerov, Fuad, MD   75 mg at 09/01/23 1118  .  Medications Prior  to Admission  Medication Sig Dispense Refill   clonazePAM  (KLONOPIN ) 1 MG tablet Take 1 mg by mouth 2 (two) times daily as needed for anxiety.     gabapentin  (NEURONTIN ) 800 MG tablet TAKE 1 TABLET BY MOUTH 3 TIMES DAILY AS NEEDED. 90 tablet 0   naproxen  (NAPROSYN ) 500 MG tablet Take 1 tablet (500 mg total) by mouth 2 (two) times daily with a meal. 60 tablet 0   QUEtiapine  (SEROQUEL ) 400 MG tablet Take 400 mg by mouth at bedtime.     venlafaxine  XR (EFFEXOR -XR) 150 MG 24 hr capsule Take 150 mg by mouth every morning.     VRAYLAR 3 MG capsule Take 3 mg by mouth daily.      OBJECTIVE:  PHYSICAL EXAM  Vitals: Blood pressure 108/68, pulse (!) 56, temperature 98.8 F (37.1 C), resp. rate 18, height 5' 6 (1.676 m), weight 90.4 kg, SpO2 98%.. General: sedated, on vent. Jamal site looks good with minimal blood staining on dressing from this AM Neck: Trach site looks good with minimal blood staining on dressing from this AM   MEDICAL DECISION MAKING: Data Review:  Results for orders placed or performed during the hospital encounter of 08/17/23 (from the past 48 hours)  Glucose, capillary     Status: Abnormal   Collection Time: 08/30/23  7:49 PM  Result Value Ref Range   Glucose-Capillary 140 (H) 70 - 99 mg/dL    Comment: Glucose reference range applies only to samples taken after fasting for at least 8 hours.  Glucose, capillary     Status: Abnormal   Collection Time: 08/30/23 11:28 PM  Result Value Ref Range   Glucose-Capillary 191 (H) 70 - 99 mg/dL    Comment: Glucose reference range applies only to samples taken after fasting for at least 8 hours.  Glucose, capillary     Status: Abnormal   Collection Time: 08/31/23  4:07 AM  Result Value Ref Range   Glucose-Capillary 150 (H) 70 - 99 mg/dL    Comment: Glucose reference range applies only to samples taken after fasting for at least 8 hours.  Magnesium      Status: None   Collection Time: 08/31/23  4:17 AM  Result Value Ref Range    Magnesium  1.9 1.7 - 2.4 mg/dL    Comment: Performed at [facility], [address] [place], [place] [address]  CBC     Status: Abnormal   Collection Time: 08/31/23  4:17 AM  Result Value Ref Range   WBC 8.3 4.0 - 10.5 K/uL   RBC 3.32 (L) 3.87 - 5.11 MIL/uL   Hemoglobin 9.7 (L) 12.0 - 15.0 g/dL   HCT 69.7 (L) 63.9 - 53.9 %   MCV 91.0 80.0 - 100.0 fL   MCH 29.2 26.0 - 34.0 pg   MCHC 32.1 30.0 - 36.0 g/dL   RDW 87.6 88.4 - 84.4 %   Platelets 210 150 - 400 K/uL   nRBC 0.0 0.0 - 0.2 %    Comment: Performed at [facility], [address] [place], [place] [address]  Phosphorus     Status: None   Collection Time: 08/31/23  4:17 AM  Result Value Ref Range   Phosphorus 3.9 2.5 - 4.6 mg/dL    Comment: Performed at [facility], [address] [place], [place] [address]  Basic metabolic panel     Status: Abnormal   Collection Time: 08/31/23  4:17 AM  Result Value Ref Range   Sodium 134 (L) 135 - 145 mmol/L   Potassium 3.4 (L) 3.5 - 5.1 mmol/L   Chloride 100 98 - 111 mmol/L   CO2 29 22 - 32 mmol/L   Glucose, Bld 237 (H) 70 - 99 mg/dL    Comment: Glucose reference range applies only to samples taken after fasting for at least 8 hours.   BUN 20 6 - 20 mg/dL   Creatinine, Ser 9.41 0.44 - 1.00 mg/dL   Calcium  8.2 (L) 8.9 - 10.3 mg/dL   GFR, Estimated >39 >39 mL/min    Comment: (NOTE) Calculated using the CKD-EPI Creatinine Equation (2021)    Anion gap 5 5 - 15    Comment: Performed at [facility], [address] [place], [place] [address]  Glucose, capillary     Status: Abnormal   Collection Time: 08/31/23  7:57 AM  Result Value Ref Range   Glucose-Capillary 284 (H) 70 - 99 mg/dL    Comment: Glucose reference range applies only to samples taken after fasting for at least 8 hours.  Glucose, capillary     Status: Abnormal   Collection Time: 08/31/23 11:41 AM  Result Value Ref Range   Glucose-Capillary 297 (H) 70 - 99 mg/dL    Comment:  Glucose reference range applies only to samples taken after fasting for at least 8 hours.  Glucose, capillary     Status: Abnormal   Collection Time: 08/31/23  3:59 PM  Result Value Ref Range   Glucose-Capillary 148 (H) 70 - 99 mg/dL    Comment: Glucose reference range applies only to samples taken after fasting for at least 8 hours.  Glucose, capillary     Status: None   Collection Time: 08/31/23  8:03 PM  Result Value Ref Range   Glucose-Capillary 78 70 - 99 mg/dL    Comment: Glucose reference range applies only to samples taken after fasting for at least 8 hours.  Potassium     Status: None   Collection Time: 08/31/23  8:08 PM  Result Value Ref Range   Potassium 4.0 3.5 - 5.1 mmol/L    Comment: Performed at [facility], [address] [place], [place] [address]  Glucose, capillary     Status: None   Collection Time: 08/31/23 11:49 PM  Result Value Ref Range   Glucose-Capillary 97 70 - 99 mg/dL    Comment: Glucose reference range applies only to samples taken after fasting for at least 8 hours.  Magnesium      Status: None   Collection Time: 09/01/23  4:00 AM  Result Value Ref Range   Magnesium  1.8 1.7 - 2.4 mg/dL    Comment: Performed at [facility], [address] [place], [place] [address]  Phosphorus     Status: None   Collection Time: 09/01/23  4:00 AM  Result Value Ref Range   Phosphorus 3.8 2.5 - 4.6 mg/dL    Comment: Performed at [facility], [address] [place], [place] [address]  Basic metabolic panel     Status: Abnormal   Collection Time: 09/01/23  4:00 AM  Result Value Ref Range   Sodium 136 135 - 145 mmol/L   Potassium 3.9 3.5 - 5.1 mmol/L   Chloride 99 98 - 111 mmol/L   CO2 27 22 - 32 mmol/L   Glucose, Bld 162 (H) 70 - 99 mg/dL    Comment: Glucose reference range applies only to samples taken after fasting for at least 8 hours.   BUN 18 6 - 20 mg/dL   Creatinine, Ser 9.45 0.44 - 1.00 mg/dL   Calcium  8.8 (L) 8.9 - 10.3  mg/dL   GFR, Estimated >39 >39 mL/min    Comment: (NOTE) Calculated using the CKD-EPI Creatinine Equation (2021)    Anion gap 10 5 - 15    Comment: Performed at [facility], [address] [place], [place] [address]  CBC with Differential/Platelet     Status: Abnormal   Collection Time: 09/01/23  4:00 AM  Result Value Ref Range   WBC 9.0 4.0 - 10.5 K/uL   RBC 3.45 (L) 3.87 - 5.11 MIL/uL   Hemoglobin 10.5 (L) 12.0 - 15.0 g/dL   HCT 68.2 (L) 63.9 - 53.9 %   MCV 91.9 80.0 - 100.0 fL   MCH 30.4 26.0 - 34.0 pg   MCHC 33.1 30.0 - 36.0 g/dL   RDW 87.6 88.4 - 84.4 %   Platelets 254 150 - 400 K/uL   nRBC 0.0 0.0 - 0.2 %   Neutrophils Relative % 70 %   Neutro Abs 6.3 1.7 - 7.7 K/uL   Lymphocytes Relative 19 %   Lymphs Abs 1.7 0.7 - 4.0 K/uL   Monocytes Relative 7 %   Monocytes Absolute 0.6 0.1 - 1.0 K/uL   Eosinophils Relative 3 %   Eosinophils Absolute 0.2 0.0 - 0.5 K/uL   Basophils Relative 1 %   Basophils Absolute 0.1 0.0 - 0.1 K/uL   Immature Granulocytes 0 %   Abs Immature Granulocytes 0.04 0.00 - 0.07 K/uL    Comment: Performed at [facility], [address] [place], [place] [address]  Glucose, capillary     Status: Abnormal   Collection Time: 09/01/23  4:03 AM  Result Value Ref Range   Glucose-Capillary 142 (H) 70 - 99 mg/dL    Comment: Glucose reference range applies only to samples taken after fasting for at least 8 hours.  Glucose, capillary     Status: Abnormal   Collection Time: 09/01/23  4:08 AM  Result Value Ref Range   Glucose-Capillary 132 (H) 70 - 99 mg/dL    Comment: Glucose reference range applies only to samples taken after fasting for at least 8 hours.  Glucose, capillary     Status: Abnormal   Collection Time: 09/01/23  7:22 AM  Result Value Ref Range   Glucose-Capillary 110 (H) 70 - 99 mg/dL    Comment: Glucose reference range applies only to samples  taken after fasting for at least 8 hours.  Glucose, capillary     Status: Abnormal    Collection Time: 09/01/23 11:04 AM  Result Value Ref Range   Glucose-Capillary 132 (H) 70 - 99 mg/dL    Comment: Glucose reference range applies only to samples taken after fasting for at least 8 hours.  Glucose, capillary     Status: Abnormal   Collection Time: 09/01/23  3:43 PM  Result Value Ref Range   Glucose-Capillary 159 (H) 70 - 99 mg/dL    Comment: Glucose reference range applies only to samples taken after fasting for at least 8 hours.  . No results found..   ASSESSMENT: s/p elective tracheostomy, doing well PLAN: Routine trach care. Staff can remove sutures on 09/08/23. Contact ENT on call this weekend if any issues.    Andrea Sanford Dolly, MD 09/01/2023 4:23 PM
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
## 2023-09-01 NOTE — Op Note (Signed)
 09/01/2023  8:12 AM    Andrea Sanford  [id number]   Pre-Op Diagnosis:  Failed extubation, Respiratory failure  Post-op Diagnosis: Failed extubation, Respiratory failure  Procedure: Elective Tracheostomy  Surgeon:  Deward GORMAN Dolly  Assistant: Carolee Hunter  Anesthesia:  General endotracheal anesthesia  EBL:  less than 20 cc  Complications:  None  Findings: None  Procedure: The patient was taken to the Operating Room from the CCU, already intubated, and placed in the supine position.  After induction of general anesthesia, the patient was placed on a shoulder roll with the neck extended. The skin was injected along the proposed incision line over the trachea with 1% lidocaine  with epinephrine , 1:100,000. The area was then prepped and draped in the usual sterile fashion.  A 15 blade was then used to incise the skin in a horizontal incision over the trachea. The dissection was carried down to the subcutaneous tissues and through the platysma with the Bovie. Anterior jugular veins were divided with the harmonic scalpel for hemostasis. The strap muscles were divided in the midline and retracted laterally. The thyroid  isthmus was exposed and divided in the midline over the trachea and dissected away from the anterior aspect of the trachea with the Bovie. With hemostasis obtained, the anesthesiologist was alerted that the airway was about to be entered so that the oxygen concentration could be lowered to reduce fire risk. The scrub tech prepared the tracheostomy tube, confirming no leak in the balloon cuff. The trachea was then incised between the 2nd and third tracheal rings, and an inferiorly based tracheal flap created by cutting through the third tracheal ring laterally. This was sutured up to the skin with a 3-0 Vicryl suture to help create a tracheocutaneous tract. The airway was suctioned and, after the anesthesiologist pulled the endotracheal tube back,  a #6 Shiley tracheostomy  tube was inserted into the tracheal lumen. The inner cannula was placed, the cuff inflated,  and the patient hooked to the anesthesia circuit for ventilation.  Initially there was some difficulty with ventilation although the tracheostomy tube was in excellent position.  The patient's airway was suctioned and thick secretions cleared which immediately improved ventilation and oxygenation.  The #6 Shiley tracheostomy seemed a little small for the airway, so this was easily exchanged for a #8 Shiley tracheostomy tube, and the cuff inflated.  CO2 return and adequate ventilation was confirmed with the anesthesiologist. Hemostasis was confirmed and the flange of the tracheostomy tube was sutured to the skin with 3-0 nylon suture. A trach tie was placed around the neck to further secure the tracheostomy tube. Surgicel was placed on either side of the wound. Betadine soaked gauze was placed around the wound.  The patient was then returned to the anesthesiologist and taken to the CCU in stable condition.  Disposition:   Return to the CCU  Plan: Routine trach care and suctioning each shift and PRN. Vent settings per CCU admitting physician. Sutures can be removed in a week.  Deward GORMAN Dolly 09/01/2023 8:12 AM

## 2023-09-01 NOTE — Progress Notes (Signed)
 Updated pt's mother Roderick Cheese via telephone on plan of care and that Tracheostomy placed this morning without incident.  All questions answers, she is very appreciative of update.    Inge Lecher, AGACNP-BC [facility] Prefer epic messenger for cross cover needs If after hours, please call E-link

## 2023-09-01 NOTE — H&P (Signed)
 History and physical reviewed . No change in medical status reported, appears stable for surgery. All questions regarding the procedure answered previously with family, and family expressed understanding of the procedure.  Andrea Sanford @TODAY @

## 2023-09-01 NOTE — Consult Note (Signed)
 PHARMACY CONSULT NOTE - ELECTROLYTES  Pharmacy Consult for Electrolyte Monitoring and Replacement   Recent Labs: Potassium (mmol/L)  Date Value  09/01/2023 3.9  10/15/2012 3.0 (L)   Magnesium  (mg/dL)  Date Value  97/92/7974 1.8  10/04/2011 1.4 (L)   Calcium  (mg/dL)  Date Value  97/92/7974 8.8 (L)   Calcium , Total (mg/dL)  Date Value  96/75/7985 8.3 (L)   Albumin (g/dL)  Date Value  97/96/7974 2.3 (L)  06/13/2023 4.3  10/15/2012 3.9   Phosphorus (mg/dL)  Date Value  97/92/7974 3.8   Sodium (mmol/L)  Date Value  09/01/2023 136  06/13/2023 142  10/15/2012 141   Height: 5' 6 (167.6 cm) Weight: 90.4 kg (199 lb 4.7 oz) IBW/kg (Calculated) : 59.3 Estimated Creatinine Clearance: 91 mL/min (by C-G formula based on SCr of 0.54 mg/dL).  Assessment  Andrea Sanford is a 53 y.o. female presenting with drug overdose / intoxication. PMH significant for polysubstance abuse, anxiety / depression, asthma / COPD, bipolar disorder, DM, neuromuscular disorder. Pharmacy has been consulted to monitor and replace electrolytes.  Goal of Therapy: Electrolytes within normal limits  Plan:  --No replacement currently indicated --Follow-up electrolytes with AM labs tomorrow  Thank you for allowing pharmacy to be a part of this patient's care.  Jemina Scahill A Herron Fero 09/01/2023 7:40 AM

## 2023-09-02 DIAGNOSIS — T50904A Poisoning by unspecified drugs, medicaments and biological substances, undetermined, initial encounter: Secondary | ICD-10-CM | POA: Diagnosis not present

## 2023-09-02 DIAGNOSIS — F10920 Alcohol use, unspecified with intoxication, uncomplicated: Secondary | ICD-10-CM | POA: Diagnosis not present

## 2023-09-02 DIAGNOSIS — J9601 Acute respiratory failure with hypoxia: Secondary | ICD-10-CM | POA: Diagnosis not present

## 2023-09-02 DIAGNOSIS — F1490 Cocaine use, unspecified, uncomplicated: Secondary | ICD-10-CM | POA: Diagnosis not present

## 2023-09-02 LAB — GLUCOSE, CAPILLARY
Glucose-Capillary: 109 mg/dL — ABNORMAL HIGH (ref 70–99)
Glucose-Capillary: 131 mg/dL — ABNORMAL HIGH (ref 70–99)
Glucose-Capillary: 156 mg/dL — ABNORMAL HIGH (ref 70–99)
Glucose-Capillary: 173 mg/dL — ABNORMAL HIGH (ref 70–99)
Glucose-Capillary: 216 mg/dL — ABNORMAL HIGH (ref 70–99)
Glucose-Capillary: 232 mg/dL — ABNORMAL HIGH (ref 70–99)

## 2023-09-02 LAB — BASIC METABOLIC PANEL
Anion gap: 9 (ref 5–15)
BUN: 21 mg/dL — ABNORMAL HIGH (ref 6–20)
CO2: 26 mmol/L (ref 22–32)
Calcium: 8.2 mg/dL — ABNORMAL LOW (ref 8.9–10.3)
Chloride: 98 mmol/L (ref 98–111)
Creatinine, Ser: 0.61 mg/dL (ref 0.44–1.00)
GFR, Estimated: >60 mL/min (ref >60)
Glucose, Bld: 375 mg/dL — ABNORMAL HIGH (ref 70–99)
Potassium: 3.9 mmol/L (ref 3.5–5.1)
Sodium: 133 mmol/L — ABNORMAL LOW (ref 135–145)

## 2023-09-02 LAB — MAGNESIUM: Magnesium: 1.8 mg/dL (ref 1.7–2.4)

## 2023-09-02 LAB — PHOSPHORUS: Phosphorus: 4.4 mg/dL (ref 2.5–4.6)

## 2023-09-02 MED ORDER — INSULIN GLARGINE-YFGN 100 UNIT/ML ~~LOC~~ SOLN
10.0000 [IU] | Freq: Every day | SUBCUTANEOUS | Status: DC
  Administered 2023-09-02 – 2023-09-15 (×13): 10 [IU] via SUBCUTANEOUS
  Filled 2023-09-02 (×14): qty 0.1

## 2023-09-02 NOTE — Anesthesia Postprocedure Evaluation (Signed)
 Anesthesia Post Note  Patient: Andrea Sanford  Procedure(s) Performed: TRACHEOSTOMY  Patient location during evaluation: SICU Anesthesia Type: General Level of consciousness: sedated Pain management: pain level controlled Vital Signs Assessment: post-procedure vital signs reviewed and stable Respiratory status: patient connected to tracheostomy mask oxygen and respiratory function unstable Cardiovascular status: stable Postop Assessment: no apparent nausea or vomiting Anesthetic complications: no   No notable events documented.   Last Vitals:  Vitals:   09/02/23 1030 09/02/23 1100  BP: (!) 90/58 (!) 84/56  Pulse: (!) 54 (!) 53  Resp: 19 19  Temp:    SpO2: 95% 96%    Last Pain:  Vitals:   09/02/23 0732  TempSrc: Oral  PainSc:                  Prentice Murphy

## 2023-09-02 NOTE — Consult Note (Signed)
 PHARMACY CONSULT NOTE - ELECTROLYTES  Pharmacy Consult for Electrolyte Monitoring and Replacement   Recent Labs: Potassium (mmol/L)  Date Value  09/02/2023 3.9  10/15/2012 3.0 (L)   Magnesium  (mg/dL)  Date Value  97/91/7974 1.8  10/04/2011 1.4 (L)   Calcium  (mg/dL)  Date Value  97/91/7974 8.2 (L)   Calcium , Total (mg/dL)  Date Value  96/75/7985 8.3 (L)   Albumin (g/dL)  Date Value  97/96/7974 2.3 (L)  06/13/2023 4.3  10/15/2012 3.9   Phosphorus (mg/dL)  Date Value  97/91/7974 4.4   Sodium (mmol/L)  Date Value  09/02/2023 133 (L)  06/13/2023 142  10/15/2012 141   Height: 5' 6 (167.6 cm) Weight: 88.2 kg (194 lb 7.1 oz) IBW/kg (Calculated) : 59.3 Estimated Creatinine Clearance: 90 mL/min (by C-G formula based on SCr of 0.61 mg/dL).  Assessment  Andrea Sanford is a 53 y.o. female presenting with drug overdose / intoxication. PMH significant for polysubstance abuse, anxiety / depression, asthma / COPD, bipolar disorder, DM, neuromuscular disorder. Pharmacy has been consulted to monitor and replace electrolytes.  Goal of Therapy: Electrolytes within normal limits  Plan:  --No replacement currently indicated --Follow-up electrolytes with AM labs tomorrow  Thank you for allowing pharmacy to be a part of this patient's care.  Adriana JONETTA Bolster 09/02/2023 7:06 AM

## 2023-09-02 NOTE — Progress Notes (Signed)
 Patient remains on trach collar at this time tolerating oxygen device well. Vent remains on standby.

## 2023-09-02 NOTE — Plan of Care (Signed)
  Problem: Fluid Volume: Goal: Ability to maintain a balanced intake and output will improve Outcome: Progressing   Problem: Clinical Measurements: Goal: Will remain free from infection Outcome: Progressing Goal: Respiratory complications will improve Outcome: Progressing Goal: Cardiovascular complication will be avoided Outcome: Progressing   Problem: Nutrition: Goal: Adequate nutrition will be maintained Outcome: Progressing   Problem: Respiratory: Goal: Ability to maintain a clear airway will improve Outcome: Progressing Goal: Levels of oxygenation will improve Outcome: Progressing Goal: Ability to maintain adequate ventilation will improve Outcome: Progressing   Problem: Activity: Goal: Ability to tolerate increased activity will improve Outcome: Progressing   Problem: Respiratory: Goal: Ability to maintain a clear airway and adequate ventilation will improve Outcome: Progressing

## 2023-09-02 NOTE — Plan of Care (Signed)
  Problem: Education: Goal: Ability to describe self-care measures that may prevent or decrease complications (Diabetes Survival Skills Education) will improve Outcome: Progressing   Problem: Coping: Goal: Ability to adjust to condition or change in health will improve Outcome: Progressing   Problem: Fluid Volume: Goal: Ability to maintain a balanced intake and output will improve Outcome: Progressing   Problem: Health Behavior/Discharge Planning: Goal: Ability to identify and utilize available resources and services will improve Outcome: Progressing Goal: Ability to manage health-related needs will improve Outcome: Progressing   Problem: Metabolic: Goal: Ability to maintain appropriate glucose levels will improve Outcome: Progressing   Problem: Nutritional: Goal: Maintenance of adequate nutrition will improve Outcome: Progressing Goal: Progress toward achieving an optimal weight will improve Outcome: Progressing   Problem: Skin Integrity: Goal: Risk for impaired skin integrity will decrease Outcome: Progressing   Problem: Tissue Perfusion: Goal: Adequacy of tissue perfusion will improve Outcome: Progressing   Problem: Education: Goal: Knowledge of General Education information will improve Description: Including pain rating scale, medication(s)/side effects and non-pharmacologic comfort measures Outcome: Progressing   Problem: Clinical Measurements: Goal: Ability to maintain clinical measurements within normal limits will improve Outcome: Progressing Goal: Will remain free from infection Outcome: Progressing Goal: Diagnostic test results will improve Outcome: Progressing Goal: Respiratory complications will improve Outcome: Progressing Goal: Cardiovascular complication will be avoided Outcome: Progressing   Problem: Nutrition: Goal: Adequate nutrition will be maintained Outcome: Progressing   Problem: Coping: Goal: Level of anxiety will decrease Outcome:  Progressing   Problem: Elimination: Goal: Will not experience complications related to bowel motility Outcome: Progressing Goal: Will not experience complications related to urinary retention Outcome: Progressing   Problem: Pain Managment: Goal: General experience of comfort will improve and/or be controlled Outcome: Progressing   Problem: Safety: Goal: Ability to remain free from injury will improve Outcome: Progressing   Problem: Skin Integrity: Goal: Risk for impaired skin integrity will decrease Outcome: Progressing
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
## 2023-09-03 DIAGNOSIS — J9601 Acute respiratory failure with hypoxia: Secondary | ICD-10-CM | POA: Diagnosis not present

## 2023-09-03 LAB — CBC
HCT: 29.7 % — ABNORMAL LOW (ref 36.0–46.0)
Hemoglobin: 9.8 g/dL — ABNORMAL LOW (ref 12.0–15.0)
MCH: 29.6 pg (ref 26.0–34.0)
MCHC: 33 g/dL (ref 30.0–36.0)
MCV: 89.7 fL (ref 80.0–100.0)
Platelets: 300 10*3/uL (ref 150–400)
RBC: 3.31 MIL/uL — ABNORMAL LOW (ref 3.87–5.11)
RDW: 12.2 % (ref 11.5–15.5)
WBC: 7.8 10*3/uL (ref 4.0–10.5)
nRBC: 0 % (ref 0.0–0.2)

## 2023-09-03 LAB — RENAL FUNCTION PANEL
Albumin: 2.3 g/dL — ABNORMAL LOW (ref 3.5–5.0)
Anion gap: 9 (ref 5–15)
BUN: 22 mg/dL — ABNORMAL HIGH (ref 6–20)
CO2: 27 mmol/L (ref 22–32)
Calcium: 8.5 mg/dL — ABNORMAL LOW (ref 8.9–10.3)
Chloride: 100 mmol/L (ref 98–111)
Creatinine, Ser: 0.63 mg/dL (ref 0.44–1.00)
GFR, Estimated: >60 mL/min (ref >60)
Glucose, Bld: 168 mg/dL — ABNORMAL HIGH (ref 70–99)
Phosphorus: 2.9 mg/dL (ref 2.5–4.6)
Potassium: 3.6 mmol/L (ref 3.5–5.1)
Sodium: 136 mmol/L (ref 135–145)

## 2023-09-03 LAB — GLUCOSE, CAPILLARY
Glucose-Capillary: 124 mg/dL — ABNORMAL HIGH (ref 70–99)
Glucose-Capillary: 149 mg/dL — ABNORMAL HIGH (ref 70–99)
Glucose-Capillary: 159 mg/dL — ABNORMAL HIGH (ref 70–99)
Glucose-Capillary: 167 mg/dL — ABNORMAL HIGH (ref 70–99)
Glucose-Capillary: 184 mg/dL — ABNORMAL HIGH (ref 70–99)
Glucose-Capillary: 193 mg/dL — ABNORMAL HIGH (ref 70–99)

## 2023-09-03 LAB — MAGNESIUM: Magnesium: 1.7 mg/dL (ref 1.7–2.4)

## 2023-09-03 MED ORDER — DIAZEPAM 5 MG/ML IJ SOLN
INTRAMUSCULAR | Status: AC
  Administered 2023-09-03: 5 mg via INTRAVENOUS
  Filled 2023-09-03: qty 2

## 2023-09-03 MED ORDER — MAGNESIUM SULFATE 2 GM/50ML IV SOLN
2.0000 g | Freq: Once | INTRAVENOUS | Status: AC
  Administered 2023-09-03: 2 g via INTRAVENOUS
  Filled 2023-09-03: qty 50

## 2023-09-03 MED ORDER — DIAZEPAM 5 MG/ML IJ SOLN
5.0000 mg | Freq: Four times a day (QID) | INTRAMUSCULAR | Status: DC
  Administered 2023-09-03 – 2023-09-05 (×7): 5 mg via INTRAVENOUS
  Filled 2023-09-03 (×7): qty 2
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
## 2023-09-03 NOTE — Consult Note (Signed)
 PHARMACY CONSULT NOTE - ELECTROLYTES  Pharmacy Consult for Electrolyte Monitoring and Replacement   Recent Labs: Potassium (mmol/L)  Date Value  09/03/2023 3.6  10/15/2012 3.0 (L)   Magnesium  (mg/dL)  Date Value  97/90/7974 1.7  10/04/2011 1.4 (L)   Calcium  (mg/dL)  Date Value  97/90/7974 8.5 (L)   Calcium , Total (mg/dL)  Date Value  96/75/7985 8.3 (L)   Albumin (g/dL)  Date Value  97/90/7974 2.3 (L)  06/13/2023 4.3  10/15/2012 3.9   Phosphorus (mg/dL)  Date Value  97/90/7974 2.9   Sodium (mmol/L)  Date Value  09/03/2023 136  06/13/2023 142  10/15/2012 141   Height: 5' 6 (167.6 cm) Weight: 88.3 kg (194 lb 10.7 oz) IBW/kg (Calculated) : 59.3 Estimated Creatinine Clearance: 90 mL/min (by C-G formula based on SCr of 0.63 mg/dL).  Assessment  Andrea Sanford is a 53 y.o. female presenting with drug overdose / intoxication. PMH significant for polysubstance abuse, anxiety / depression, asthma / COPD, bipolar disorder, DM, neuromuscular disorder. Pharmacy has been consulted to monitor and replace electrolytes.   Nutrition: Osmolite 1.5 at 60 mL/hr + free water  flushes 30 mL every 4 hours  Goal of Therapy: Electrolytes within normal limits  Plan:  --2 grams IV magnesium  sulfate x 1 --Follow-up electrolytes with AM labs tomorrow  Thank you for allowing pharmacy to be a part of this patient's care.  Adriana JONETTA Bolster 09/03/2023 7:21 AM

## 2023-09-03 NOTE — Plan of Care (Signed)
  Problem: Respiratory: Goal: Ability to maintain a clear airway will improve Outcome: Progressing Goal: Levels of oxygenation will improve Outcome: Progressing Goal: Ability to maintain adequate ventilation will improve Outcome: Progressing   Problem: Activity: Goal: Ability to tolerate increased activity will improve Outcome: Progressing   Problem: Respiratory: Goal: Ability to maintain a clear airway and adequate ventilation will improve Outcome: Progressing

## 2023-09-03 NOTE — Plan of Care (Signed)
  Problem: Fluid Volume: Goal: Ability to maintain a balanced intake and output will improve Outcome: Progressing   Problem: Health Behavior/Discharge Planning: Goal: Ability to manage health-related needs will improve Outcome: Progressing   Problem: Metabolic: Goal: Ability to maintain appropriate glucose levels will improve Outcome: Progressing   Problem: Nutritional: Goal: Maintenance of adequate nutrition will improve Outcome: Progressing   Problem: Skin Integrity: Goal: Risk for impaired skin integrity will decrease Outcome: Progressing   Problem: Tissue Perfusion: Goal: Adequacy of tissue perfusion will improve Outcome: Progressing   Problem: Clinical Measurements: Goal: Will remain free from infection Outcome: Progressing Goal: Diagnostic test results will improve Outcome: Progressing Goal: Respiratory complications will improve Outcome: Progressing Goal: Cardiovascular complication will be avoided Outcome: Progressing   Problem: Activity: Goal: Risk for activity intolerance will decrease Outcome: Progressing

## 2023-09-04 ENCOUNTER — Other Ambulatory Visit: Payer: Self-pay | Admitting: Family Medicine

## 2023-09-04 ENCOUNTER — Encounter: Payer: Self-pay | Admitting: Otolaryngology

## 2023-09-04 ENCOUNTER — Inpatient Hospital Stay: Payer: Medicaid Other | Admitting: Radiology

## 2023-09-04 DIAGNOSIS — J9601 Acute respiratory failure with hypoxia: Secondary | ICD-10-CM

## 2023-09-04 DIAGNOSIS — I10 Essential (primary) hypertension: Secondary | ICD-10-CM

## 2023-09-04 HISTORY — PX: IR GASTROSTOMY TUBE MOD SED: IMG625

## 2023-09-04 LAB — RENAL FUNCTION PANEL
Albumin: 2.4 g/dL — ABNORMAL LOW (ref 3.5–5.0)
Anion gap: 8 (ref 5–15)
BUN: 21 mg/dL — ABNORMAL HIGH (ref 6–20)
CO2: 30 mmol/L (ref 22–32)
Calcium: 8.5 mg/dL — ABNORMAL LOW (ref 8.9–10.3)
Chloride: 99 mmol/L (ref 98–111)
Creatinine, Ser: 0.54 mg/dL (ref 0.44–1.00)
GFR, Estimated: >60 mL/min (ref >60)
Glucose, Bld: 197 mg/dL — ABNORMAL HIGH (ref 70–99)
Phosphorus: 4.4 mg/dL (ref 2.5–4.6)
Potassium: 4.3 mmol/L (ref 3.5–5.1)
Sodium: 137 mmol/L (ref 135–145)

## 2023-09-04 LAB — CBC
HCT: 30.8 % — ABNORMAL LOW (ref 36.0–46.0)
Hemoglobin: 9.9 g/dL — ABNORMAL LOW (ref 12.0–15.0)
MCH: 29.5 pg (ref 26.0–34.0)
MCHC: 32.1 g/dL (ref 30.0–36.0)
MCV: 91.7 fL (ref 80.0–100.0)
Platelets: 333 10*3/uL (ref 150–400)
RBC: 3.36 MIL/uL — ABNORMAL LOW (ref 3.87–5.11)
RDW: 12.4 % (ref 11.5–15.5)
WBC: 7.7 10*3/uL (ref 4.0–10.5)
nRBC: 0 % (ref 0.0–0.2)

## 2023-09-04 LAB — PROTIME-INR
INR: 1.1 (ref 0.8–1.2)
Prothrombin Time: 14.6 s (ref 11.4–15.2)

## 2023-09-04 LAB — GLUCOSE, CAPILLARY
Glucose-Capillary: 112 mg/dL — ABNORMAL HIGH (ref 70–99)
Glucose-Capillary: 135 mg/dL — ABNORMAL HIGH (ref 70–99)
Glucose-Capillary: 136 mg/dL — ABNORMAL HIGH (ref 70–99)
Glucose-Capillary: 138 mg/dL — ABNORMAL HIGH (ref 70–99)
Glucose-Capillary: 145 mg/dL — ABNORMAL HIGH (ref 70–99)
Glucose-Capillary: 179 mg/dL — ABNORMAL HIGH (ref 70–99)

## 2023-09-04 LAB — MAGNESIUM: Magnesium: 1.8 mg/dL (ref 1.7–2.4)

## 2023-09-04 MED ORDER — FREE WATER
30.0000 mL | Status: DC
  Administered 2023-09-04 – 2023-09-14 (×57): 30 mL

## 2023-09-04 MED ORDER — LIDOCAINE HCL 1 % IJ SOLN
10.0000 mL | Freq: Once | INTRAMUSCULAR | Status: AC
Start: 2023-09-04 — End: 2023-09-04
  Administered 2023-09-04: 10 mL via INTRADERMAL

## 2023-09-04 MED ORDER — GLUCAGON HCL RDNA (DIAGNOSTIC) 1 MG IJ SOLR
INTRAMUSCULAR | Status: AC | PRN
  Administered 2023-09-04: 1 mg via INTRAVENOUS

## 2023-09-04 MED ORDER — OSMOLITE 1.5 CAL PO LIQD
1000.0000 mL | ORAL | Status: DC
  Administered 2023-09-04 – 2023-09-13 (×8): 1000 mL

## 2023-09-04 MED ORDER — GLUCAGON HCL RDNA (DIAGNOSTIC) 1 MG IJ SOLR
INTRAMUSCULAR | Status: AC
Start: 2023-09-04 — End: ?
  Filled 2023-09-04: qty 1

## 2023-09-04 MED ORDER — NOREPINEPHRINE 4 MG/250ML-% IV SOLN
INTRAVENOUS | Status: AC
  Filled 2023-09-04: qty 250

## 2023-09-04 MED ORDER — CEFAZOLIN SODIUM-DEXTROSE 2-4 GM/100ML-% IV SOLN
INTRAVENOUS | Status: AC
  Filled 2023-09-04: qty 100

## 2023-09-04 MED ORDER — MAGNESIUM SULFATE 2 GM/50ML IV SOLN
2.0000 g | Freq: Once | INTRAVENOUS | Status: AC
  Administered 2023-09-04: 2 g via INTRAVENOUS
  Filled 2023-09-04: qty 50

## 2023-09-04 MED ORDER — CEFAZOLIN SODIUM-DEXTROSE 1-4 GM/50ML-% IV SOLN
INTRAVENOUS | Status: AC | PRN
  Administered 2023-09-04: 2 g via INTRAVENOUS

## 2023-09-04 MED ORDER — MIDAZOLAM HCL 2 MG/2ML IJ SOLN
INTRAMUSCULAR | Status: AC
  Filled 2023-09-04: qty 2

## 2023-09-04 MED ORDER — LIDOCAINE HCL 1 % IJ SOLN
INTRAMUSCULAR | Status: AC
  Filled 2023-09-04: qty 20

## 2023-09-04 MED ORDER — MIDAZOLAM HCL 2 MG/2ML IJ SOLN
INTRAMUSCULAR | Status: AC | PRN
  Administered 2023-09-04: 1 mg via INTRAVENOUS

## 2023-09-04 MED ORDER — FLUCONAZOLE IN SODIUM CHLORIDE 200-0.9 MG/100ML-% IV SOLN
200.0000 mg | INTRAVENOUS | Status: AC
  Administered 2023-09-04 – 2023-09-10 (×7): 200 mg via INTRAVENOUS
  Filled 2023-09-04 (×9): qty 100

## 2023-09-04 MED ORDER — FENTANYL CITRATE (PF) 100 MCG/2ML IJ SOLN
INTRAMUSCULAR | Status: AC
  Filled 2023-09-04: qty 2

## 2023-09-04 MED ORDER — IOHEXOL 300 MG/ML  SOLN
10.0000 mL | Freq: Once | INTRAMUSCULAR | Status: AC | PRN
  Administered 2023-09-04: 12 mL

## 2023-09-04 NOTE — Progress Notes (Signed)
 Pt back from IR

## 2023-09-04 NOTE — Progress Notes (Signed)
 Nutrition Follow-up  DOCUMENTATION CODES:   Severe malnutrition in context of acute illness/injury  INTERVENTION:   Resume Osmolite 1.5@60ml /hr continuous + ProSource TF 20- Give 60ml daily via tube  Free water  flushes 30ml q4 hours to maintain tube patency   Regimen provides 2240kcal/day, 110g/day protein and 1224ml/day of free water .   Juven Fruit Punch BID via tube, each serving provides 95kcal and 2.5g of protein (amino acids glutamine and arginine)  Daily weights   NUTRITION DIAGNOSIS:   Severe Malnutrition related to acute illness as evidenced by moderate fat depletion, severe fat depletion, moderate muscle depletion, severe muscle depletion. -ongoing   GOAL:   Provide needs based on ASPEN/SCCM guidelines -met   MONITOR:   Vent status, Labs, Weight trends, TF tolerance, Skin, I & O's  ASSESSMENT:   53 y/o female with h/o DM, bipolar disorder, substance abuse, anxiety, depression, COPD, OSA and homelessness who is admitted with AKI, overdose and aspiration PNA.  -Pt s/p 20 Fr pull through gastrostomy tube placement today  -Pt s/p tracheostomy 2/7  Pt s/p G-tube today. Will plan to resume tube feeds around 1800. Pt has been tolerating tube feeds at goal rate. Refeed labs stable. Per chart, pt has remained fairly weight stable since admission. Pt -2.4L on her I & Os.   Medications reviewed and include: folic acid , heparin , insulin , juven, oxycodone , protonix , thiamine    Labs reviewed: K 4.3 wnl, BUN 21(H), P 4.4 wnl, Mg 1.8 wnl Hgb 9.9(L), Hct 30.8(L) Cbgs- 136, 135, 179 x 24 hrs   Patient is currently intubated on ventilator support MV: 8.0 L/min Temp (24hrs), Avg:99.2 F (37.3 C), Min:98.9 F (37.2 C), Max:100 F (37.8 C)  MAP- >57mmHg   UOP-  Diet Order:   Diet Order     None      EDUCATION NEEDS:   No education needs have been identified at this time  Skin:  Skin Assessment: Reviewed RN Assessment (Stage II buttocks/coccyx, laceration  L knee)  Last BM:  2/10- TYPE 6  Height:   Ht Readings from Last 1 Encounters:  08/17/23 5\' 6"  (1.676 m)    Weight:   Wt Readings from Last 1 Encounters:  09/04/23 91.7 kg    Ideal Body Weight:  61.4 kg  BMI:  Body mass index is 32.63 kg/m.  Estimated Nutritional Needs:   Kcal:  1900-2200kcal/day  Protein:  95-110g/day  Fluid:  1.8-2.1L/day  Torrance Freestone MS, RD, LDN If unable to be reached, please send secure chat to "RD inpatient" available from 8:00a-4:00p daily

## 2023-09-04 NOTE — Progress Notes (Signed)
 Attempted to update pts mother Thane Finch via telephone, however she did not answer the phone and unable to leave a voicemail message.  Janey Meek, AGNP  Pulmonary/Critical Care Pager [phone] (please enter 7 digits) PCCM Consult Pager [phone] (please enter 7 digits)

## 2023-09-04 NOTE — Procedures (Signed)
 Interventional Radiology Procedure:   Indications:  Dysphagia and acute illness  Procedure: Gastrostomy tube placement  Findings: 20 Fr pull through gastrostomy tube placed.    Complications: None     EBL: Minimal  Plan: May use for medications now and may start feeds in 4 hours  Whittany Parish R. Julietta Ogren, MD  Pager: [phone]

## 2023-09-04 NOTE — Plan of Care (Signed)
  Problem: Nutritional: Goal: Maintenance of adequate nutrition will improve Outcome: Progressing   Problem: Skin Integrity: Goal: Risk for impaired skin integrity will decrease Outcome: Progressing   Problem: Elimination: Goal: Will not experience complications related to bowel motility Outcome: Progressing Goal: Will not experience complications related to urinary retention Outcome: Progressing
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
## 2023-09-04 NOTE — Consult Note (Signed)
 PHARMACY CONSULT NOTE - ELECTROLYTES  Pharmacy Consult for Electrolyte Monitoring and Replacement   Recent Labs: Potassium (mmol/L)  Date Value  09/04/2023 4.3  10/15/2012 3.0 (L)   Magnesium  (mg/dL)  Date Value  11/91/4782 1.8  10/04/2011 1.4 (L)   Calcium  (mg/dL)  Date Value  95/62/1308 8.5 (L)   Calcium , Total (mg/dL)  Date Value  65/78/4696 8.3 (L)   Albumin (g/dL)  Date Value  29/52/8413 2.4 (L)  06/13/2023 4.3  10/15/2012 3.9   Phosphorus (mg/dL)  Date Value  24/40/1027 4.4   Sodium (mmol/L)  Date Value  09/04/2023 137  06/13/2023 142  10/15/2012 141   Height: 5\' 6"  (167.6 cm) Weight: 91.7 kg (202 lb 2.6 oz) IBW/kg (Calculated) : 59.3 Estimated Creatinine Clearance: 91.8 mL/min (by C-G formula based on SCr of 0.54 mg/dL).  Assessment  Andrea Sanford is a 53 y.o. female presenting with drug overdose / intoxication. PMH significant for polysubstance abuse, anxiety / depression, asthma / COPD, bipolar disorder, DM, neuromuscular disorder. Pharmacy has been consulted to monitor and replace electrolytes.   Nutrition: Osmolite 1.5 at 60 mL/hr + free water  flushes 30 mL every 4 hours  Goal of Therapy: Electrolytes within normal limits  Plan:  --Mg 1.8, magnesium  sulfate 2 g IV x 1 --Follow-up electrolytes with AM labs tomorrow  Thank you for allowing pharmacy to be a part of this patient's care.  Page Boast 09/04/2023 7:53 AM

## 2023-09-04 NOTE — Plan of Care (Signed)
  Problem: Coping: Goal: Ability to adjust to condition or change in health will improve Outcome: Progressing   Problem: Fluid Volume: Goal: Ability to maintain a balanced intake and output will improve Outcome: Progressing   Problem: Metabolic: Goal: Ability to maintain appropriate glucose levels will improve Outcome: Progressing   Problem: Nutritional: Goal: Maintenance of adequate nutrition will improve Outcome: Progressing   Problem: Skin Integrity: Goal: Risk for impaired skin integrity will decrease Outcome: Progressing   Problem: Tissue Perfusion: Goal: Adequacy of tissue perfusion will improve Outcome: Progressing   Problem: Education: Goal: Knowledge of General Education information will improve Description: Including pain rating scale, medication(s)/side effects and non-pharmacologic comfort measures Outcome: Progressing   Problem: Clinical Measurements: Goal: Respiratory complications will improve Outcome: Progressing

## 2023-09-04 NOTE — Consult Note (Signed)
 Pharmacy Antibiotic Note  Andrea Sanford is a 53 y.o. female with history of polysubstance abuse, psychiatric disorder, smoking / COPD admitted on 08/17/2023 with  unresponsiveness in setting of suspect drug intoxication . Pharmacy has been consulted for fluconazole  dosing.  Plan: start fluconazole  200 mg IV every 24 hours ---follow renal function for needed dose adjustments  Height: 5\' 6"  (167.6 cm) Weight: 91.7 kg (202 lb 2.6 oz) IBW/kg (Calculated) : 59.3  Temp (24hrs), Avg:99.1 F (37.3 C), Min:98.6 F (37 C), Max:100 F (37.8 C)  Recent Labs  Lab 08/30/23 0431 08/31/23 0417 09/01/23 0400 09/02/23 0437 09/03/23 0432 09/04/23 0458  WBC 10.6* 8.3 9.0  --  7.8 7.7  CREATININE 0.63 0.58 0.54 0.61 0.63 0.54    Estimated Creatinine Clearance: 91.8 mL/min (by C-G formula based on SCr of 0.54 mg/dL).    No Known Allergies  Antimicrobials this admission: Zosyn  1/25 >> 1/27 Augmentin  1/27 >> 1/29 Bactrim  1/29 >> 2/3 vancomycin  2/3 >> 2/7 fluconazole  2/10 >>  Microbiology results: 1/23 MRSA PCR: (-) 1/23 Bcx: NG 1/25 Bcx: NG 1/26 RVP: (-) 1/26 RespCx: MRSA 1/27 BAL: MSSA  Thank you for allowing pharmacy to be a part of this patient's care.  Adalberto Acton 09/04/2023 5:08 PM

## 2023-09-04 NOTE — Progress Notes (Signed)
 Pt escorted to IR by this RN for PEG tube placement

## 2023-09-05 ENCOUNTER — Inpatient Hospital Stay: Payer: Medicaid Other

## 2023-09-05 DIAGNOSIS — G9341 Metabolic encephalopathy: Secondary | ICD-10-CM | POA: Diagnosis not present

## 2023-09-05 DIAGNOSIS — I10 Essential (primary) hypertension: Secondary | ICD-10-CM | POA: Diagnosis not present

## 2023-09-05 DIAGNOSIS — J9601 Acute respiratory failure with hypoxia: Secondary | ICD-10-CM | POA: Diagnosis not present

## 2023-09-05 LAB — CBC
HCT: 32.1 % — ABNORMAL LOW (ref 36.0–46.0)
Hemoglobin: 10.1 g/dL — ABNORMAL LOW (ref 12.0–15.0)
MCH: 29.6 pg (ref 26.0–34.0)
MCHC: 31.5 g/dL (ref 30.0–36.0)
MCV: 94.1 fL (ref 80.0–100.0)
Platelets: 361 10*3/uL (ref 150–400)
RBC: 3.41 MIL/uL — ABNORMAL LOW (ref 3.87–5.11)
RDW: 12.8 % (ref 11.5–15.5)
WBC: 8.6 10*3/uL (ref 4.0–10.5)
nRBC: 0 % (ref 0.0–0.2)

## 2023-09-05 LAB — BASIC METABOLIC PANEL
Anion gap: 12 (ref 5–15)
BUN: 20 mg/dL (ref 6–20)
CO2: 28 mmol/L (ref 22–32)
Calcium: 8.4 mg/dL — ABNORMAL LOW (ref 8.9–10.3)
Chloride: 94 mmol/L — ABNORMAL LOW (ref 98–111)
Creatinine, Ser: 0.75 mg/dL (ref 0.44–1.00)
GFR, Estimated: >60 mL/min (ref >60)
Glucose, Bld: 282 mg/dL — ABNORMAL HIGH (ref 70–99)
Potassium: 4.2 mmol/L (ref 3.5–5.1)
Sodium: 134 mmol/L — ABNORMAL LOW (ref 135–145)

## 2023-09-05 LAB — URINALYSIS, COMPLETE (UACMP) WITH MICROSCOPIC
Bilirubin Urine: NEGATIVE
Glucose, UA: NEGATIVE mg/dL
Ketones, ur: NEGATIVE mg/dL
Nitrite: NEGATIVE
Protein, ur: 30 mg/dL — AB
RBC / HPF: >50 RBC/hpf (ref 0–5)
Specific Gravity, Urine: 1.024 (ref 1.005–1.030)
pH: 5 (ref 5.0–8.0)

## 2023-09-05 LAB — GLUCOSE, CAPILLARY
Glucose-Capillary: 132 mg/dL — ABNORMAL HIGH (ref 70–99)
Glucose-Capillary: 153 mg/dL — ABNORMAL HIGH (ref 70–99)
Glucose-Capillary: 187 mg/dL — ABNORMAL HIGH (ref 70–99)
Glucose-Capillary: 83 mg/dL (ref 70–99)
Glucose-Capillary: 95 mg/dL (ref 70–99)
Glucose-Capillary: 95 mg/dL (ref 70–99)

## 2023-09-05 LAB — MAGNESIUM: Magnesium: 1.9 mg/dL (ref 1.7–2.4)

## 2023-09-05 LAB — PROCALCITONIN: Procalcitonin: 0.26 ng/mL

## 2023-09-05 LAB — LACTIC ACID, PLASMA: Lactic Acid, Venous: 1.1 mmol/L (ref 0.5–1.9)

## 2023-09-05 MED ORDER — IOHEXOL 9 MG/ML PO SOLN
500.0000 mL | Freq: Once | ORAL | Status: AC | PRN
  Administered 2023-09-05 (×2): 500 mL via ORAL

## 2023-09-05 MED ORDER — OXYCODONE HCL 5 MG PO TABS
30.0000 mg | ORAL_TABLET | Freq: Four times a day (QID) | ORAL | Status: DC
  Administered 2023-09-05 – 2023-09-06 (×4): 30 mg
  Filled 2023-09-05 (×4): qty 6

## 2023-09-05 MED ORDER — PIPERACILLIN-TAZOBACTAM 3.375 G IVPB
3.3750 g | Freq: Three times a day (TID) | INTRAVENOUS | Status: AC
  Administered 2023-09-05 – 2023-09-10 (×15): 3.375 g via INTRAVENOUS
  Filled 2023-09-05 (×15): qty 50

## 2023-09-05 MED ORDER — POLYETHYLENE GLYCOL 3350 17 G PO PACK
17.0000 g | PACK | Freq: Every day | ORAL | Status: DC
  Administered 2023-09-05 – 2023-09-13 (×5): 17 g
  Filled 2023-09-05 (×9): qty 1

## 2023-09-05 MED ORDER — ENOXAPARIN SODIUM 60 MG/0.6ML IJ SOSY
45.0000 mg | PREFILLED_SYRINGE | INTRAMUSCULAR | Status: DC
  Administered 2023-09-05 – 2023-09-15 (×11): 45 mg via SUBCUTANEOUS
  Filled 2023-09-05 (×12): qty 0.6

## 2023-09-05 MED ORDER — SODIUM CHLORIDE 0.9 % IV BOLUS: 500.0000 mL | Freq: Once | INTRAVENOUS | Status: AC

## 2023-09-05 MED ORDER — LACTATED RINGERS IV BOLUS
500.0000 mL | Freq: Once | INTRAVENOUS | Status: AC
  Administered 2023-09-05: 500 mL via INTRAVENOUS

## 2023-09-05 MED ORDER — VANCOMYCIN HCL IN DEXTROSE 1-5 GM/200ML-% IV SOLN
1000.0000 mg | Freq: Two times a day (BID) | INTRAVENOUS | Status: AC
  Administered 2023-09-05 – 2023-09-10 (×10): 1000 mg via INTRAVENOUS
  Filled 2023-09-05 (×11): qty 200

## 2023-09-05 MED ORDER — DIAZEPAM 5 MG/ML IJ SOLN
5.0000 mg | Freq: Four times a day (QID) | INTRAMUSCULAR | Status: DC | PRN
  Administered 2023-09-05 – 2023-09-11 (×9): 5 mg via INTRAVENOUS
  Filled 2023-09-05 (×10): qty 2

## 2023-09-05 MED ORDER — VANCOMYCIN HCL 2000 MG/400ML IV SOLN
2000.0000 mg | Freq: Once | INTRAVENOUS | Status: AC
  Administered 2023-09-05: 2000 mg via INTRAVENOUS
  Filled 2023-09-05: qty 400
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
## 2023-09-05 NOTE — Plan of Care (Signed)
  Problem: Nutritional: Goal: Maintenance of adequate nutrition will improve Outcome: Progressing   Problem: Skin Integrity: Goal: Risk for impaired skin integrity will decrease Outcome: Progressing   Problem: Tissue Perfusion: Goal: Adequacy of tissue perfusion will improve Outcome: Progressing   Problem: Clinical Measurements: Goal: Will remain free from infection Outcome: Progressing Goal: Diagnostic test results will improve Outcome: Progressing Goal: Respiratory complications will improve Outcome: Progressing

## 2023-09-05 NOTE — Consult Note (Signed)
PHARMACY CONSULT NOTE - ELECTROLYTES  Pharmacy Consult for Electrolyte Monitoring and Replacement   Recent Labs: Potassium (mmol/L)  Date Value  09/05/2023 4.2  10/15/2012 3.0 (L)   Magnesium (mg/dL)  Date Value  78/46/9629 1.9  10/04/2011 1.4 (L)   Calcium (mg/dL)  Date Value  52/84/1324 8.4 (L)   Calcium, Total (mg/dL)  Date Value  40/04/2724 8.3 (L)   Albumin (g/dL)  Date Value  36/64/4034 2.4 (L)  06/13/2023 4.3  10/15/2012 3.9   Phosphorus (mg/dL)  Date Value  74/25/9563 4.4   Sodium (mmol/L)  Date Value  09/05/2023 134 (L)  06/13/2023 142  10/15/2012 141   Height: 5\' 6"  (167.6 cm) Weight: 92.5 kg (203 lb 14.8 oz) IBW/kg (Calculated) : 59.3 Estimated Creatinine Clearance: 92.1 mL/min (by C-G formula based on SCr of 0.75 mg/dL).  Assessment  Andrea Sanford is a 53 y.o. female presenting with drug overdose / intoxication. PMH significant for polysubstance abuse, anxiety / depression, asthma / COPD, bipolar disorder, DM, neuromuscular disorder. Pharmacy has been consulted to monitor and replace electrolytes.  Nutrition: Osmolite 1.5 at 60 mL/hr + free water flushes 30 mL every 4 hours  Goal of Therapy: Electrolytes within normal limits  Plan:  -- No replacement indicated today -- Follow-up electrolytes with AM labs tomorrow  Thank you for allowing pharmacy to be a part of this patient's care.  Effie Shy, PharmD Pharmacy Resident  09/05/2023 7:49 AM
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
## 2023-09-05 NOTE — Evaluation (Addendum)
Occupational Therapy Evaluation Patient Details Name: Andrea Sanford MRN: [id number] DOB: 1971/03/12 Today's Date: 09/05/2023   History of Present Illness   History of Present Illness: Pt is a 53 y/o F admitted on 08/17/23 after being found unresponsive in a personal vehicle. Pt is being treated for acute hypoxic respiratory failure 2/2 suspected drug overdose of undetermined intent requiring emergent intubation & mechanical ventilatory support. Pt failed multiple trials of extubation, pt had tracheostomy placed 09/01/23. Pt had gastrostomy tube placed 09/04/23. PMH: cocaine abuse, anxiety, depression, asthma, bipolar disorder, CA, COPD & OSA, DM2, HTN, neuromuscular disorder     Clinical Impressions Chart reviewed to date, pt greeted in bed, alert and follows one step commands inconsistently however improved with increased time, multi modal cues. Attempted to use communication board with patient but pt with poor attention to task, will continue to trial. Pt does attempt to mouth words and will say yes/no when asked questions. Appropriately responds approx 75% of the time. Pt is a poor historian however and will need to confirm PLOF/home set up. Pt tracks therapist throughout the room. Pt presents with deficits in strength, endurance, activity tolerance, balance, cognition, affecting safe and optimal ADL performance. MAX A +1-2 required for bed mobility, STS with MAX A +2, two lateral steps up  the bed to the L with MAX A +2. LB dressing performed with MAX A.Nurse in room throughout. Pt will benefit from acute OT to address deficits and to facilitate optimal ADL performance. Pt is left in care of nurse, all needs met. All lines, leads, trach/gtube intact pre/post session. OT will follow.    Of note:   BP checked in LUE, supine at beginning of session: 94/59 mmHg MAP 71 supine at end of session 108/67 mmHg MAP 81   Spo2 >90% on 10L via trach collar throughout      If plan is discharge home,  recommend the following:   Two people to help with walking and/or transfers;Two people to help with bathing/dressing/bathroom     Functional Status Assessment   Patient has had a recent decline in their functional status and demonstrates the ability to make significant improvements in function in a reasonable and predictable amount of time.     Equipment Recommendations   Other (comment) (defer)     Recommendations for Other Services         Precautions/Restrictions   Precautions Precautions: Fall Recall of Precautions/Restrictions: Impaired Restrictions Weight Bearing Restrictions Per Provider Order: No     Mobility Bed Mobility Overal bed mobility: Needs Assistance Bed Mobility: Supine to Sit, Sit to Supine     Supine to sit: Max assist, +2 for safety/equipment, HOB elevated, Used rails Sit to supine: Max assist, +2 for physical assistance, HOB elevated, Used rails, +2 for safety/equipment        Transfers Overall transfer level: Needs assistance Equipment used: 2 person hand held assist Transfers: Sit to/from Stand Sit to Stand: Max assist, +2 physical assistance           General transfer comment: two lateral steps up the bed with MAX A +2 via HHA      Balance Overall balance assessment: Needs assistance Sitting-balance support: Feet supported, Bilateral upper extremity supported Sitting balance-Leahy Scale: Poor   Postural control: Posterior lean, Right lateral lean Standing balance support: During functional activity, Bilateral upper extremity supported, Reliant on assistive device for balance Standing balance-Leahy Scale: Zero  ADL either performed or assessed with clinical judgement   ADL Overall ADL's : Needs assistance/impaired Eating/Feeding: NPO                   Lower Body Dressing: Maximal assistance;Sitting/lateral leans;Sit to/from stand       Toileting- Architect  and Hygiene: Maximal assistance;Sit to/from stand;Sitting/lateral lean               Vision   Additional Comments: will continue to assess     Perception         Praxis         Pertinent Vitals/Pain Pain Assessment Pain Assessment: CPOT Facial Expression: Relaxed, neutral Body Movements: Absence of movements Muscle Tension: Relaxed Compliance with ventilator (intubated pts.): N/A Vocalization (extubated pts.): Talking in normal tone or no sound CPOT Total: 0 Pain Location: generalized Pain Descriptors / Indicators: Discomfort Pain Intervention(s): Monitored during session     Extremity/Trunk Assessment Upper Extremity Assessment Upper Extremity Assessment: Generalized weakness   Lower Extremity Assessment Lower Extremity Assessment: Generalized weakness       Communication Communication Communication: Impaired Factors Affecting Communication: Trach/intubated   Cognition Arousal: Alert Behavior During Therapy: Flat affect Cognition: No family/caregiver present to determine baseline, Cognition impaired   Orientation impairments: Time, Situation         OT - Cognition Comments: pt follows one step commands inconsistently- limited                 Following commands: Impaired Following commands impaired: Follows one step commands inconsistently, Follows one step commands with increased time     Cueing  General Comments   Cueing Techniques: Verbal cues;Gestural cues;Tactile cues;Visual cues      Exercises Other Exercises Other Exercises: edu re: role of OT, role of rehab, importance of progressing mobitliy   Shoulder Instructions      Home Living Family/patient expects to be discharged to:: Unsure                                 Additional Comments: Pt unable to provide home set up, she reports she does not live with anyone (shakes head no)      Prior Functioning/Environment Prior Level of Function : Patient poor  historian/Family not available               ADLs Comments: endorses she is indep in ADL when asked via yes/no but will need to confirm    OT Problem List: Decreased strength;Decreased activity tolerance;Decreased knowledge of use of DME or AE;Decreased safety awareness;Impaired balance (sitting and/or standing);Decreased cognition;Cardiopulmonary status limiting activity   OT Treatment/Interventions: Self-care/ADL training;DME and/or AE instruction;Therapeutic activities;Balance training;Therapeutic exercise;Cognitive remediation/compensation;Visual/perceptual remediation/compensation;Neuromuscular education;Energy conservation;Patient/family education      OT Goals(Current goals can be found in the care plan section)   Acute Rehab OT Goals Patient Stated Goal: pt nods yes to improving mobility OT Goal Formulation: With patient Time For Goal Achievement: 09/19/23 Potential to Achieve Goals: Good ADL Goals Pt Will Perform Grooming: with min assist;sitting Pt Will Perform Lower Body Dressing: with min assist;sitting/lateral leans;sit to/from stand Pt Will Transfer to Toilet: with min assist;ambulating Pt Will Perform Toileting - Clothing Manipulation and hygiene: with mod assist;sitting/lateral leans;sit to/from stand   OT Frequency:  Min 1X/week    Co-evaluation PT/OT/SLP Co-Evaluation/Treatment: Yes Reason for Co-Treatment: Complexity of the patient's impairments (multi-system involvement);Necessary to address cognition/behavior during functional activity;For patient/therapist safety PT goals addressed  during session: Mobility/safety with mobility;Balance;Strengthening/ROM OT goals addressed during session: ADL's and self-care      AM-PAC OT "6 Clicks" Daily Activity     Outcome Measure Help from another person eating meals?: Total Help from another person taking care of personal grooming?: A Lot Help from another person toileting, which includes using toliet, bedpan, or  urinal?: A Lot Help from another person bathing (including washing, rinsing, drying)?: A Lot Help from another person to put on and taking off regular upper body clothing?: A Lot Help from another person to put on and taking off regular lower body clothing?: A Lot 6 Click Score: 11   End of Session Equipment Utilized During Treatment: Oxygen Nurse Communication: Mobility status  Activity Tolerance: Patient tolerated treatment well Patient left: in bed;with call bell/phone within reach;with bed alarm set;with nursing/sitter in room  OT Visit Diagnosis: Other abnormalities of gait and mobility (R26.89);Muscle weakness (generalized) (M62.81);Unsteadiness on feet (R26.81)                Time: 1610-9604 OT Time Calculation (min): 18 min Charges:  OT General Charges $OT Visit: 1 Visit OT Evaluation $OT Eval High Complexity: 1 High  Oleta Mouse, OTD OTR/L  09/05/23, 4:14 PM

## 2023-09-05 NOTE — Consult Note (Signed)
Pharmacy Antibiotic Note  Andrea Sanford is a 53 y.o. female admitted on 08/17/2023 with sepsis. Originally admitted to the ICU for acute hypoxia respiratory failure secondary to suspected drug overdose requiring emergent intubation and mechanical ventilatory support. They've had a complicated admission history with 2x failed extubations now s/p tracheostomy placement 2/7. They previously had Staph aureus positive tracheal aspirate and BAL which was treated with vancomycin for 5 days (2/3 - 2/7). They started fevering and placed on empiric antibiotics. New blood cultures ordered. Pharmacy has been consulted for Vancomycin and Zosyn dosing.  Today, 09/05/2023 Scr 0.75 (baseline) WBC 8.6 Lactic acid 1.1 Procal 0.26 Tmax 103.3  - multiple fevers last 24/hrs while on tylenol   Plan: Day 1 of Vancomycin and Zosyn  Gave LD of vancomycin 2000 mg x 1 Followed by vancomycin 1000 mg IV Q12H. Goal AUC 400-550. Expected AUC: 449.1 Expected Css min: 13.0 SCr used: 0.8  Weight used: IBW, Vd used: 0.72 (BMI 32.91) Patient is also on Zosyn 3.375 g IV Q8H Patient is also on Fluconazole 200 mg daily x 7 days total  Continue to monitor renal function and follow culture results  Height: 5\' 6"  (167.6 cm) Weight: 92.5 kg (203 lb 14.8 oz) IBW/kg (Calculated) : 59.3  Temp (24hrs), Avg:100.3 F (37.9 C), Min:98.6 F (37 C), Max:103.3 F (39.6 C)  Recent Labs  Lab 08/31/23 0417 09/01/23 0400 09/02/23 0437 09/03/23 0432 09/04/23 0458 09/05/23 0437  WBC 8.3 9.0  --  7.8 7.7 8.6  CREATININE 0.58 0.54 0.61 0.63 0.54 0.75    Estimated Creatinine Clearance: 92.1 mL/min (by C-G formula based on SCr of 0.75 mg/dL).    No Known Allergies  Antimicrobials this admission: 1/25 - 1/27 Zosyn 1/27 - 1/29 Augmentin 1/29 - 2/2 Bactrim 2/3 - 2/7 Vancomycin 2/10 Fluconazole >> end 2/16 2/11 Zosyn >>  2/11 Vancomycin >>  Dose adjustments this admission: NA  Microbiology results: 1/23 MRSA PCR:  negative 1/25 BCx: NGTD 1/26 Aspiration/Trach: GPC, MRSA  1/27 BAL: GPC MRSA  2/11 BCx: In process  Thank you for allowing pharmacy to be a part of this patient's care.  Effie Shy, PharmD Pharmacy Resident  09/05/2023 12:56 PM

## 2023-09-05 NOTE — Progress Notes (Signed)
Referring Provider(s): Harlon Ditty, NP (CCM)  Supervising Physician: Gilmer Mor  Patient Status:  [facility] - In-pt  Chief Complaint: Malnutrition s/p acute illness/ injury for G-tube follow up  Brief History:  53 y.o. female who, per chart review, has a history of T2DM, HTN, Bipolar disorder, anxiety, depression, alcohol use, and polysubstance abuse who initially presented to the ED via EMS on 08/17/23 after being found unresponsive in a personal vehicle with an acquaintance. Found to have acute metabolic encephalopathy and acute hypoxic respiratory failure secondary to drug OD, alcohol intoxication, aspiration, and staph aureus pneumonia. Patient has required intubation and mechanical ventilation since admission. She has failed extubation twice and ENT has been consulted for possible tracheostomy placement. Intermittently receiving tube feeds. IR consulted for possible gastrostomy tube placement. CT Abdomen on 08/29/23 showing anatomy amenable for PEG placement. Images and case reviewed by Dr. Donne Hazel. PEG tube placed in IR by Dr. Jacqualine Code on 09/04/23.   Subjective:  Patient lying in bed resting in no acute distress. G-tube in use.   Allergies: Patient has no known allergies.  Medications: Prior to Admission medications   Medication Sig Start Date End Date Taking? Authorizing Provider  clonazePAM (KLONOPIN) 1 MG tablet Take 1 mg by mouth 2 (two) times daily as needed for anxiety.   Yes [provider]  gabapentin (NEURONTIN) 800 MG tablet TAKE 1 TABLET BY MOUTH 3 TIMES DAILY AS NEEDED. 08/14/23   Pardue, Monico Blitz, DO  losartan (COZAAR) 25 MG tablet TAKE 1 TABLET (25 MG TOTAL) BY MOUTH DAILY. 08/21/23   Sherlyn Hay, DO  naproxen (NAPROSYN) 500 MG tablet Take 1 tablet (500 mg total) by mouth 2 (two) times daily with a meal. 07/24/23   Sallee Provencal, FNP  QUEtiapine (SEROQUEL) 400 MG tablet Take 400 mg by mouth at bedtime.    [provider]  venlafaxine XR  (EFFEXOR-XR) 150 MG 24 hr capsule Take 150 mg by mouth every morning. 06/09/23   [provider]  VRAYLAR 3 MG capsule Take 3 mg by mouth daily. 11/28/20   [provider]     Vital Signs: BP 111/66   Pulse 95   Temp 99.9 F (37.7 C) (Oral)   Resp (!) 9   Ht 5\' 6"  (1.676 m)   Wt 203 lb 14.8 oz (92.5 kg)   SpO2 100%   BMI 32.91 kg/m   Physical Exam Vitals reviewed.  Constitutional:      Appearance: She is ill-appearing.     Comments: NAD  HENT:     Head: Normocephalic and atraumatic.     Mouth/Throat:     Mouth: Mucous membranes are moist.     Pharynx: Oropharynx is clear.  Neck:     Trachea: Tracheostomy present.  Cardiovascular:     Rate and Rhythm: Normal rate.  Pulmonary:     Effort: Pulmonary effort is normal.  Abdominal:     General: Abdomen is flat.     Palpations: Abdomen is soft.     Comments: Gastrostomy tube with overlying bandage in place. Skin is clean and dry with no signs of leakage.   Skin:    General: Skin is warm and dry.     Labs:  CBC: Recent Labs    09/01/23 0400 09/03/23 0432 09/04/23 0458 09/05/23 0437  WBC 9.0 7.8 7.7 8.6  HGB 10.5* 9.8* 9.9* 10.1*  HCT 31.7* 29.7* 30.8* 32.1*  PLT 254 300 333 361    COAGS: Recent Labs  08/30/23 0431 09/04/23 0458  INR 1.2 1.1    BMP: Recent Labs    09/02/23 0437 09/03/23 0432 09/04/23 0458 09/05/23 0437  NA 133* 136 137 134*  K 3.9 3.6 4.3 4.2  CL 98 100 99 94*  CO2 26 27 30 28   GLUCOSE 375* 168* 197* 282*  BUN 21* 22* 21* 20  CALCIUM 8.2* 8.5* 8.5* 8.4*  CREATININE 0.61 0.63 0.54 0.75  GFRNONAA >60 >60 >60 >60    LIVER FUNCTION TESTS: Recent Labs    06/13/23 1440 08/17/23 0415 08/17/23 0511 08/19/23 0324 08/27/23 0522 08/28/23 0514 09/03/23 0432 09/04/23 0458  BILITOT 0.3 0.5  --   --   --   --   --   --   AST 22 19  --   --   --   --   --   --   ALT 12 QUANTITY NOT SUFFICIENT, UNABLE TO PERFORM TEST 11  --   --   --   --   --   ALKPHOS 123* 81   --   --   --   --   --   --   PROT 7.9 7.2  --   --   --   --   --   --   ALBUMIN 4.3 3.7  --    < > 2.6* 2.3* 2.3* 2.4*   < > = values in this interval not displayed.    Assessment and Plan:  53 y.o. female who, per chart review, has a history of T2DM, HTN, Bipolar disorder, anxiety, depression, alcohol use, and polysubstance abuse who initially presented to the ED via EMS on 08/17/23 after being found unresponsive in a personal vehicle with an acquaintance. Found to have acute metabolic encephalopathy and acute hypoxic respiratory failure secondary to drug OD, alcohol intoxication, aspiration, and staph aureus pneumonia. Patient has required intubation and mechanical ventilation since admission. She has failed extubation twice and ENT has been consulted for possible tracheostomy placement. Intermittently receiving tube feeds. IR consulted for possible gastrostomy tube placement. CT Abdomen on 08/29/23 showing anatomy amenable for PEG placement. Images and case reviewed by Dr. Donne Hazel. PEG tube placed in IR by Dr. Jacqualine Code on 09/04/23.    Patient seen for g-tube site s/p placement of 20 Fr pull-through gastrostomy tube on 09/04/23 in IR with Dr. Lowella Dandy   Insertion site is clean, dry, dressed and appropriately tender to palpation.  No bleeding or leakage. Tube previously used this morning without problem.   Ok to continue using g-tube for medicines, tube feeds, free water, etc.  Please call IR for any questions or concerns regarding the g-tube.    Electronically Signed: Jama Flavors, PA-C 09/05/2023, 12:20 PM    I spent a total of 15 Minutes at the the patient's bedside AND on the patient's hospital floor or unit, greater than 50% of which was counseling/coordinating care for gastrostomy tube follow-up.

## 2023-09-05 NOTE — Progress Notes (Signed)
Foley removed per MD order.

## 2023-09-05 NOTE — Progress Notes (Signed)
Inpatient Rehab Admissions Coordinator:   Per PT recommendations pt was screened for CIR appropriateness.  Pt appears to be a potential candidate and I will place a consult order per our protocol.  Will need to confirm caregiver support as she will likely need 24/7 assist at discharge.   Estill Dooms, PT, DPT Admissions Coordinator [phone] 09/05/23  4:10 PM

## 2023-09-05 NOTE — Evaluation (Signed)
Physical Therapy Evaluation Patient Details Name: Andrea Sanford MRN: [id number] DOB: 14-Jan-1971 Today's Date: 09/05/2023  History of Present Illness  Pt is a 53 y/o F admitted on 08/17/23 after being found unresponsive in a personal vehicle. Pt is being treated for acute hypoxic respiratory failure 2/2 suspected drug overdose of undetermined intent requiring emergent intubation & mechanical ventilatory support. Pt failed multiple trials of extubation, pt had tracheostomy placed 09/01/23. Pt had gastrostomy tube placed 09/04/23. PMH: cocaine abuse, anxiety, depression, asthma, bipolar disorder, CA, COPD & OSA, DM2, HTN, neuromuscular disorder  Clinical Impression  Pt seen for PT evaluation with pt received in bed. Pt alert, attempted to use communication board but not successful (question if this is 2/2 impaired cognition or pt's impaired UE to point at board) but pt attempted to communicate with head nods. Pt presents with generalized weakness but is somewhat able to lift BLE against gravity in bed & sitting EOB. Pt transfers to sitting EOB with +2 assist, tolerates sitting EOB ~10 minutes with assistance, & progresses to standing & taking 1 step along EOB with max assist +2. Pt tolerated session well. Will continue to follow pt acutely to progress mobility as able.      If plan is discharge home, recommend the following: Two people to help with walking and/or transfers;Two people to help with bathing/dressing/bathroom;Help with stairs or ramp for entrance;Direct supervision/assist for medications management;Assistance with feeding;Assist for transportation;Direct supervision/assist for financial management;Assistance with cooking/housework;Supervision due to cognitive status   Can travel by private vehicle        Equipment Recommendations Other (comment) (defer to next venue)  Recommendations for Other Services  Rehab consult    Functional Status Assessment Patient has had a recent decline in  their functional status and demonstrates the ability to make significant improvements in function in a reasonable and predictable amount of time.     Precautions / Restrictions Precautions Precautions: Fall Restrictions Weight Bearing Restrictions Per Provider Order: No      Mobility  Bed Mobility Overal bed mobility: Needs Assistance Bed Mobility: Supine to Sit, Sit to Supine     Supine to sit: Max assist, +2 for safety/equipment, HOB elevated, Used rails (2nd person present for safety but pt able to complete with 1 person assist) Sit to supine: Max assist, +2 for physical assistance, HOB elevated, Used rails, +2 for safety/equipment        Transfers Overall transfer level: Needs assistance Equipment used: 2 person hand held assist Transfers: Sit to/from Stand Sit to Stand: Max assist, +2 physical assistance           General transfer comment: STS from EOB with HHA +2 with pt able to push to standing & clear buttocks    Ambulation/Gait Ambulation/Gait assistance: Max assist Gait Distance (Feet): 1 Feet Assistive device: 2 person hand held assist   Gait velocity: decreased     General Gait Details: Pt able to take 1-2 steps to L at EOB towards HOB with BUE HHA, max assist +2, PT/OT blocking BLE knees to prevent buckling but pt able to initiate weight shifting & stepping.  Stairs            Wheelchair Mobility     Tilt Bed    Modified Rankin (Stroke Patients Only)       Balance Overall balance assessment: Needs assistance Sitting-balance support: Feet supported, Bilateral upper extremity supported Sitting balance-Leahy Scale: Poor   Postural control: Posterior lean Standing balance support: During functional activity, Bilateral upper  extremity supported, Reliant on assistive device for balance Standing balance-Leahy Scale: Zero                               Pertinent Vitals/Pain Pain Assessment Pain Assessment: Faces Faces Pain  Scale: No hurt    Home Living Family/patient expects to be discharged to:: Unsure                   Additional Comments: Pt unable to provide home set up, when asked if she lived with someone she shakes head "no"    Prior Function Prior Level of Function : Patient poor historian/Family not available                     Extremity/Trunk Assessment   Upper Extremity Assessment Upper Extremity Assessment: Generalized weakness    Lower Extremity Assessment Lower Extremity Assessment: Generalized weakness (2+/5 RLE knee extension, 2/5 L knee extension)       Communication   Communication Communication: Impaired Factors Affecting Communication: Trach/intubated    Cognition Arousal: Alert Behavior During Therapy: Flat affect   PT - Cognitive impairments: Orientation, Awareness, Memory, Attention, Initiation, Sequencing, Problem solving, Safety/Judgement                       PT - Cognition Comments: aware of being in hospital when given choice of two, aware of her age when given choice of two (to nod head yes/no) Following commands: Impaired Following commands impaired: Follows one step commands inconsistently, Follows one step commands with increased time     Cueing Cueing Techniques: Verbal cues, Gestural cues, Tactile cues, Visual cues     General Comments General comments (skin integrity, edema, etc.): BP checked in LUE, supine at beginning of session: 94/59 mmHg MAP 71, supine at end of session 108/67 mmHg MAP 81    Exercises     Assessment/Plan    PT Assessment Patient needs continued PT services  PT Problem List Decreased strength;Cardiopulmonary status limiting activity;Pain;Decreased coordination;Decreased cognition;Impaired sensation;Decreased range of motion;Decreased activity tolerance;Decreased balance;Decreased mobility;Decreased knowledge of precautions;Decreased safety awareness;Decreased knowledge of use of DME       PT Treatment  Interventions DME instruction;Balance training;Modalities;Gait training;Neuromuscular re-education;Stair training;Functional mobility training;Therapeutic activities;Therapeutic exercise;Patient/family education;Cognitive remediation;Wheelchair mobility training;Manual techniques    PT Goals (Current goals can be found in the Care Plan section)  Acute Rehab PT Goals PT Goal Formulation: Patient unable to participate in goal setting Time For Goal Achievement: 09/19/23 Potential to Achieve Goals: Fair    Frequency Min 1X/week     Co-evaluation PT/OT/SLP Co-Evaluation/Treatment: Yes Reason for Co-Treatment: Complexity of the patient's impairments (multi-system involvement);Necessary to address cognition/behavior during functional activity;For patient/therapist safety PT goals addressed during session: Mobility/safety with mobility;Balance;Strengthening/ROM         AM-PAC PT "6 Clicks" Mobility  Outcome Measure Help needed turning from your back to your side while in a flat bed without using bedrails?: A Lot Help needed moving from lying on your back to sitting on the side of a flat bed without using bedrails?: Total Help needed moving to and from a bed to a chair (including a wheelchair)?: Total Help needed standing up from a chair using your arms (e.g., wheelchair or bedside chair)?: Total Help needed to walk in hospital room?: Total Help needed climbing 3-5 steps with a railing? : Total 6 Click Score: 7    End of Session Equipment Utilized During  Treatment: Oxygen Activity Tolerance: Patient tolerated treatment well Patient left: in bed;with call bell/phone within reach;with bed alarm set;with nursing/sitter in room Nurse Communication: Mobility status PT Visit Diagnosis: Unsteadiness on feet (R26.81);Other abnormalities of gait and mobility (R26.89);Muscle weakness (generalized) (M62.81);Difficulty in walking, not elsewhere classified (R26.2)    Time: 4098-1191 PT Time  Calculation (min) (ACUTE ONLY): 18 min   Charges:   PT Evaluation $PT Eval High Complexity: 1 High   PT General Charges $$ ACUTE PT VISIT: 1 Visit         Andrea Sanford, PT, DPT 09/05/23, 3:43 PM   Andrea Sanford 09/05/2023, 3:40 PM

## 2023-09-05 NOTE — Telephone Encounter (Signed)
Requested medication (s) are due for refill today: yes  Requested medication (s) are on the active medication list: yes  Last refill:  08/21/23 #30  Future visit scheduled: no  Notes to clinic:  pt is currently hospitalized   Requested Prescriptions  Pending Prescriptions Disp Refills   losartan (COZAAR) 25 MG tablet [Pharmacy Med Name: LOSARTAN POTASSIUM 25 MG TAB] 90 tablet 1    Sig: TAKE 1 TABLET (25 MG TOTAL) BY MOUTH DAILY.     Cardiovascular:  Angiotensin Receptor Blockers Passed - 09/05/2023  1:47 PM      Passed - Cr in normal range and within 180 days    Creatinine  Date Value Ref Range Status  10/15/2012 0.83 0.60 - 1.30 mg/dL Final   Creatinine, Ser  Date Value Ref Range Status  09/05/2023 0.75 0.44 - 1.00 mg/dL Final         Passed - K in normal range and within 180 days    Potassium  Date Value Ref Range Status  09/05/2023 4.2 3.5 - 5.1 mmol/L Final  10/15/2012 3.0 (L) 3.5 - 5.1 mmol/L Final         Passed - Patient is not pregnant      Passed - Last BP in normal range    BP Readings from Last 1 Encounters:  09/05/23 111/66         Passed - Valid encounter within last 6 months    Recent Outpatient Visits           1 month ago Closed nondisplaced fracture of right clavicle with delayed healing, unspecified part of clavicle, subsequent encounter   [facility] [place], Jennette Kettle, FNP   2 months ago No-show for appointment   [facility] Jacky Kindle, FNP   2 months ago Fall, initial encounter   [facility], Monico Blitz, DO

## 2023-09-05 NOTE — Progress Notes (Signed)
   09/05/23 2200  Urine Characteristics  Urinary Interventions Bladder scan;Intermittent/Straight cath  Bladder Scan Volume (mL) 966 mL  Intermittent/Straight Cath (mL) 1350 mL   I & O CATH X 2 post foley removal .

## 2023-09-06 DIAGNOSIS — I959 Hypotension, unspecified: Secondary | ICD-10-CM | POA: Diagnosis not present

## 2023-09-06 DIAGNOSIS — G9341 Metabolic encephalopathy: Secondary | ICD-10-CM | POA: Diagnosis not present

## 2023-09-06 DIAGNOSIS — J9601 Acute respiratory failure with hypoxia: Secondary | ICD-10-CM | POA: Diagnosis not present

## 2023-09-06 LAB — CBC
HCT: 30.4 % — ABNORMAL LOW (ref 36.0–46.0)
Hemoglobin: 9.7 g/dL — ABNORMAL LOW (ref 12.0–15.0)
MCH: 29.5 pg (ref 26.0–34.0)
MCHC: 31.9 g/dL (ref 30.0–36.0)
MCV: 92.4 fL (ref 80.0–100.0)
Platelets: 336 10*3/uL (ref 150–400)
RBC: 3.29 MIL/uL — ABNORMAL LOW (ref 3.87–5.11)
RDW: 13.4 % (ref 11.5–15.5)
WBC: 8.1 10*3/uL (ref 4.0–10.5)
nRBC: 0 % (ref 0.0–0.2)

## 2023-09-06 LAB — GLUCOSE, CAPILLARY
Glucose-Capillary: 168 mg/dL — ABNORMAL HIGH (ref 70–99)
Glucose-Capillary: 164 mg/dL — ABNORMAL HIGH (ref 70–99)
Glucose-Capillary: 193 mg/dL — ABNORMAL HIGH (ref 70–99)
Glucose-Capillary: 174 mg/dL — ABNORMAL HIGH (ref 70–99)
Glucose-Capillary: 140 mg/dL — ABNORMAL HIGH (ref 70–99)
Glucose-Capillary: 130 mg/dL — ABNORMAL HIGH (ref 70–99)

## 2023-09-06 LAB — BASIC METABOLIC PANEL
Anion gap: 8 (ref 5–15)
BUN: 13 mg/dL (ref 6–20)
CO2: 31 mmol/L (ref 22–32)
Calcium: 8.7 mg/dL — ABNORMAL LOW (ref 8.9–10.3)
Chloride: 99 mmol/L (ref 98–111)
Creatinine, Ser: 0.68 mg/dL (ref 0.44–1.00)
GFR, Estimated: >60 mL/min (ref >60)
Glucose, Bld: 176 mg/dL — ABNORMAL HIGH (ref 70–99)
Potassium: 4.1 mmol/L (ref 3.5–5.1)
Sodium: 138 mmol/L (ref 135–145)

## 2023-09-06 LAB — MAGNESIUM: Magnesium: 1.9 mg/dL (ref 1.7–2.4)

## 2023-09-06 MED ORDER — LACTATED RINGERS IV BOLUS
500.0000 mL | Freq: Once | INTRAVENOUS | Status: AC
Start: 2023-09-06 — End: 2023-09-06
  Administered 2023-09-06: 500 mL via INTRAVENOUS

## 2023-09-06 MED ORDER — OXYCODONE HCL 5 MG PO TABS
20.0000 mg | ORAL_TABLET | Freq: Four times a day (QID) | ORAL | Status: DC
  Administered 2023-09-06 – 2023-09-11 (×19): 20 mg
  Filled 2023-09-06 (×19): qty 4

## 2023-09-06 MED ORDER — QUETIAPINE FUMARATE 200 MG PO TABS
400.0000 mg | ORAL_TABLET | Freq: Two times a day (BID) | ORAL | Status: DC
  Administered 2023-09-06 – 2023-09-15 (×18): 400 mg
  Filled 2023-09-06 (×5): qty 2
  Filled 2023-09-06 (×2): qty 4
  Filled 2023-09-06: qty 2
  Filled 2023-09-06: qty 4
  Filled 2023-09-06 (×5): qty 2
  Filled 2023-09-06: qty 4
  Filled 2023-09-06 (×4): qty 2

## 2023-09-06 NOTE — Progress Notes (Signed)
Physical Therapy Treatment Patient Details Name: Andrea Sanford MRN: [id number] DOB: 09/29/1970 Today's Date: 09/06/2023   History of Present Illness Pt is a 52 y/o F admitted on 08/17/23 after being found unresponsive in a personal vehicle. Pt is being treated for acute hypoxic respiratory failure 2/2 suspected drug overdose of undetermined intent requiring emergent intubation & mechanical ventilatory support. Pt failed multiple trials of extubation, pt had tracheostomy placed 09/01/23. Pt had gastrostomy tube placed 09/04/23. PMH: cocaine abuse, anxiety, depression, asthma, bipolar disorder, CA, COPD & OSA, DM2, HTN, neuromuscular disorder    PT Comments  Patient received sleeping in bed. She is alert to touch/voice. Patient able to follow simple commands. Patient requires mod/max A to roll, max A +2 for side lying><sit. Patient stood x 3 with Max/total A +2. Poor sitting balance at edge of bed with lob in every direction, requires hands on assist at all times. She will continue to benefit from skilled PT to improve functional independence, strength and balance.         If plan is discharge home, recommend the following: Two people to help with walking and/or transfers;Two people to help with bathing/dressing/bathroom;Help with stairs or ramp for entrance;Direct supervision/assist for medications management;Assistance with feeding;Assist for transportation;Direct supervision/assist for financial management;Assistance with cooking/housework;Supervision due to cognitive status   Can travel by private vehicle     No  Equipment Recommendations  Other (comment) (TBD)    Recommendations for Other Services       Precautions / Restrictions Precautions Precautions: Fall Recall of Precautions/Restrictions: Impaired Restrictions Weight Bearing Restrictions Per Provider Order: No     Mobility  Bed Mobility Overal bed mobility: Needs Assistance Bed Mobility: Rolling, Sidelying to Sit, Sit to  Sidelying Rolling: Mod assist Sidelying to sit: Max assist, HOB elevated, Used rails     Sit to sidelying: Max assist, +2 for physical assistance      Transfers Overall transfer level: Needs assistance Equipment used: 2 person hand held assist Transfers: Sit to/from Stand Sit to Stand: Max assist, +2 physical assistance           General transfer comment: STS x 3 reps this session. Limited ability to balance in standing or tolerate.    Ambulation/Gait               General Gait Details: unable   Stairs             Wheelchair Mobility     Tilt Bed    Modified Rankin (Stroke Patients Only)       Balance Overall balance assessment: Needs assistance Sitting-balance support: Feet supported Sitting balance-Leahy Scale: Poor Sitting balance - Comments: lob in every direction with sitting needing hands on assist at all times Postural control: Posterior lean, Right lateral lean, Left lateral lean, Other (comment) (anterior lean) Standing balance support: Bilateral upper extremity supported, During functional activity Standing balance-Leahy Scale: Zero                              Communication Communication Communication: Impaired Factors Affecting Communication: Trach/intubated  Cognition Arousal: Alert Behavior During Therapy: Flat affect   PT - Cognitive impairments: Orientation, Awareness, Memory, Attention, Initiation, Sequencing, Problem solving, Safety/Judgement, Difficult to assess Difficult to assess due to: Tracheostomy, Level of arousal                       Following commands: Impaired Following commands impaired: Follows one  step commands inconsistently, Follows one step commands with increased time    Cueing Cueing Techniques: Verbal cues, Gestural cues, Tactile cues, Visual cues  Exercises      General Comments        Pertinent Vitals/Pain Pain Assessment Pain Assessment: CPOT Faces Pain Scale: No  hurt Facial Expression: Relaxed, neutral Body Movements: Absence of movements Muscle Tension: Relaxed Compliance with ventilator (intubated pts.): N/A Vocalization (extubated pts.): Talking in normal tone or no sound CPOT Total: 0 Pain Intervention(s): Monitored during session, Repositioned    Home Living                          Prior Function            PT Goals (current goals can now be found in the care plan section) Acute Rehab PT Goals PT Goal Formulation: Patient unable to participate in goal setting Time For Goal Achievement: 09/19/23 Potential to Achieve Goals: Fair Progress towards PT goals: Progressing toward goals    Frequency    Min 1X/week      PT Plan      Co-evaluation PT/OT/SLP Co-Evaluation/Treatment: Yes Reason for Co-Treatment: For patient/therapist safety;To address functional/ADL transfers PT goals addressed during session: Mobility/safety with mobility;Balance        AM-PAC PT "6 Clicks" Mobility   Outcome Measure  Help needed turning from your back to your side while in a flat bed without using bedrails?: A Lot Help needed moving from lying on your back to sitting on the side of a flat bed without using bedrails?: Total Help needed moving to and from a bed to a chair (including a wheelchair)?: Total Help needed standing up from a chair using your arms (e.g., wheelchair or bedside chair)?: Total Help needed to walk in hospital room?: Total Help needed climbing 3-5 steps with a railing? : Total 6 Click Score: 7    End of Session Equipment Utilized During Treatment: Oxygen Activity Tolerance: Patient tolerated treatment well;Patient limited by lethargy Patient left: in bed;with call bell/phone within reach Nurse Communication: Mobility status PT Visit Diagnosis: Muscle weakness (generalized) (M62.81);Other abnormalities of gait and mobility (R26.89)     Time: 1914-7829 PT Time Calculation (min) (ACUTE ONLY): 16  min  Charges:    $Therapeutic Activity: 8-22 mins PT General Charges $$ ACUTE PT VISIT: 1 Visit                     Tyana Butzer, PT, GCS 09/06/23,2:15 PM

## 2023-09-06 NOTE — Consult Note (Signed)
PHARMACY CONSULT NOTE - ELECTROLYTES  Pharmacy Consult for Electrolyte Monitoring and Replacement   Recent Labs: Potassium (mmol/L)  Date Value  09/06/2023 4.1  10/15/2012 3.0 (L)   Magnesium (mg/dL)  Date Value  54/03/8118 1.9  10/04/2011 1.4 (L)   Calcium (mg/dL)  Date Value  14/78/2956 8.7 (L)   Calcium, Total (mg/dL)  Date Value  21/30/8657 8.3 (L)   Albumin (g/dL)  Date Value  84/69/6295 2.4 (L)  06/13/2023 4.3  10/15/2012 3.9   Phosphorus (mg/dL)  Date Value  28/41/3244 4.4   Sodium (mmol/L)  Date Value  09/06/2023 138  06/13/2023 142  10/15/2012 141   Height: 5\' 6"  (167.6 cm) Weight: 91.6 kg (201 lb 15.1 oz) IBW/kg (Calculated) : 59.3 Estimated Creatinine Clearance: 91.6 mL/min (by C-G formula based on SCr of 0.68 mg/dL).  Assessment  Andrea Sanford is a 53 y.o. female presenting with drug overdose / intoxication. PMH significant for polysubstance abuse, anxiety / depression, asthma / COPD, bipolar disorder, DM, neuromuscular disorder. Pharmacy has been consulted to monitor and replace electrolytes.  Goal of Therapy: Electrolytes within normal limits  Plan:  -- No replacement indicated today -- Follow-up electrolytes with AM labs tomorrow  Thank you for allowing pharmacy to be a part of this patient's care.  Effie Shy, PharmD Pharmacy Resident  09/06/2023 11:40 AM

## 2023-09-06 NOTE — TOC Progression Note (Addendum)
Transition of Care [facility]) - Progression Note    Patient Details  Name: Andrea Sanford MRN: [id number] Date of Birth: 06/10/1971  Transition of Care [facility]) CM/SW Contact  Chapman Fitch, RN Phone Number: 09/06/2023, 10:08 AM  Clinical Narrative:     Faye Ramsay from [facility] and [place] from [facility] to review    Update: [facility] are unable to accept     Expected Discharge Plan and Services                                               Social Determinants of Health (SDOH) Interventions SDOH Screenings   Food Insecurity: Patient Unable To Answer (08/17/2023)  Housing: Patient Unable To Answer (08/17/2023)  Transportation Needs: Patient Unable To Answer (08/17/2023)  Utilities: Patient Unable To Answer (08/17/2023)  Alcohol Screen: Low Risk  (06/13/2023)  Depression (PHQ2-9): High Risk (06/13/2023)  Financial Resource Strain: High Risk (08/29/2017)  Physical Activity: Inactive (08/29/2017)  Social Connections: Moderately Isolated (08/29/2017)  Stress: Stress Concern Present (08/29/2017)  Tobacco Use: High Risk (08/18/2023)    Readmission Risk Interventions     No data to display
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
## 2023-09-07 DIAGNOSIS — G9341 Metabolic encephalopathy: Secondary | ICD-10-CM | POA: Diagnosis not present

## 2023-09-07 DIAGNOSIS — J9601 Acute respiratory failure with hypoxia: Secondary | ICD-10-CM | POA: Diagnosis not present

## 2023-09-07 LAB — CBC
HCT: 28.1 % — ABNORMAL LOW (ref 36.0–46.0)
Hemoglobin: 9 g/dL — ABNORMAL LOW (ref 12.0–15.0)
MCH: 29.7 pg (ref 26.0–34.0)
MCHC: 32 g/dL (ref 30.0–36.0)
MCV: 92.7 fL (ref 80.0–100.0)
Platelets: 356 10*3/uL (ref 150–400)
RBC: 3.03 MIL/uL — ABNORMAL LOW (ref 3.87–5.11)
RDW: 12.9 % (ref 11.5–15.5)
WBC: 8.4 10*3/uL (ref 4.0–10.5)
nRBC: 0 % (ref 0.0–0.2)

## 2023-09-07 LAB — BASIC METABOLIC PANEL
Anion gap: 6 (ref 5–15)
BUN: 17 mg/dL (ref 6–20)
CO2: 34 mmol/L — ABNORMAL HIGH (ref 22–32)
Calcium: 8.8 mg/dL — ABNORMAL LOW (ref 8.9–10.3)
Chloride: 98 mmol/L (ref 98–111)
Creatinine, Ser: 0.59 mg/dL (ref 0.44–1.00)
GFR, Estimated: >60 mL/min (ref >60)
Glucose, Bld: 202 mg/dL — ABNORMAL HIGH (ref 70–99)
Potassium: 4 mmol/L (ref 3.5–5.1)
Sodium: 138 mmol/L (ref 135–145)

## 2023-09-07 LAB — CULTURE, RESPIRATORY W GRAM STAIN: Gram Stain: NONE SEEN

## 2023-09-07 LAB — MAGNESIUM: Magnesium: 1.9 mg/dL (ref 1.7–2.4)

## 2023-09-07 LAB — GLUCOSE, CAPILLARY
Glucose-Capillary: 245 mg/dL — ABNORMAL HIGH (ref 70–99)
Glucose-Capillary: 138 mg/dL — ABNORMAL HIGH (ref 70–99)
Glucose-Capillary: 187 mg/dL — ABNORMAL HIGH (ref 70–99)
Glucose-Capillary: 221 mg/dL — ABNORMAL HIGH (ref 70–99)
Glucose-Capillary: 119 mg/dL — ABNORMAL HIGH (ref 70–99)

## 2023-09-07 NOTE — Consult Note (Signed)
PHARMACY CONSULT NOTE - ELECTROLYTES  Pharmacy Consult for Electrolyte Monitoring and Replacement   Recent Labs: Potassium (mmol/L)  Date Value  09/07/2023 4.0  10/15/2012 3.0 (L)   Magnesium (mg/dL)  Date Value  96/10/5407 1.9  10/04/2011 1.4 (L)   Calcium (mg/dL)  Date Value  81/19/1478 8.8 (L)   Calcium, Total (mg/dL)  Date Value  29/56/2130 8.3 (L)   Albumin (g/dL)  Date Value  86/57/8469 2.4 (L)  06/13/2023 4.3  10/15/2012 3.9   Phosphorus (mg/dL)  Date Value  62/95/2841 4.4   Sodium (mmol/L)  Date Value  09/07/2023 138  06/13/2023 142  10/15/2012 141   Height: 5\' 6"  (167.6 cm) Weight: 92.7 kg (204 lb 5.9 oz) IBW/kg (Calculated) : 59.3 Estimated Creatinine Clearance: 92.3 mL/min (by C-G formula based on SCr of 0.59 mg/dL).  Assessment  Andrea Sanford is a 53 y.o. female presenting with drug overdose / intoxication. PMH significant for polysubstance abuse, anxiety / depression, asthma / COPD, bipolar disorder, DM, neuromuscular disorder. Pharmacy has been consulted to monitor and replace electrolytes.  Goal of Therapy: Electrolytes within normal limits  Plan:  -- No replacement indicated today -- Follow-up electrolytes with AM labs tomorrow  Thank you for allowing pharmacy to be a part of this patient's care.  Effie Shy, PharmD Pharmacy Resident  09/07/2023 8:32 AM
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
## 2023-09-07 NOTE — TOC Progression Note (Addendum)
Transition of Care [facility] - Progression Note    Patient Details  Name: Andrea Sanford MRN: [id number] Date of Birth: Oct 17, 1970  Transition of Care [facility]) CM/SW Contact  Chapman Fitch, RN Phone Number: 09/07/2023, 10:49 AM  Clinical Narrative:    VM left for mother to discuss disposition Fl2 sent for signature Once signed toc to upload to [facility].  PASRR pending.   Bed search initiated          Expected Discharge Plan and Services                                               Social Determinants of Health (SDOH) Interventions SDOH Screenings   Food Insecurity: Patient Unable To Answer (08/17/2023)  Housing: Patient Unable To Answer (08/17/2023)  Transportation Needs: Patient Unable To Answer (08/17/2023)  Utilities: Patient Unable To Answer (08/17/2023)  Alcohol Screen: Low Risk  (06/13/2023)  Depression (PHQ2-9): High Risk (06/13/2023)  Financial Resource Strain: High Risk (08/29/2017)  Physical Activity: Inactive (08/29/2017)  Social Connections: Moderately Isolated (08/29/2017)  Stress: Stress Concern Present (08/29/2017)  Tobacco Use: High Risk (08/18/2023)    Readmission Risk Interventions     No data to display
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
## 2023-09-07 NOTE — Consult Note (Addendum)
Pharmacy Antibiotic Note  Andrea Sanford is a 53 y.o. female admitted on 08/17/2023 with sepsis. Originally admitted to the ICU for acute hypoxia respiratory failure secondary to suspected drug overdose requiring emergent intubation and mechanical ventilatory support. They've had a complicated admission history with 2x failed extubations now s/p tracheostomy placement 2/7. They previously had Staph aureus positive tracheal aspirate and BAL which was treated with vancomycin for 5 days (2/3 - 2/7). They started fevering and placed on empiric antibiotics. New blood cultures ordered. Pharmacy has been consulted for Vancomycin and Zosyn dosing.  Today, 09/07/2023 Scr 0.59 stable WBC 8.4 Lactic acid 1.1 Procal 0.26 Tmax 101 last 24 hours   Plan: Day 1 of Vancomycin and Zosyn duration of therapy 5 days total to end 2/16 Gave LD of vancomycin 2000 mg x 1 Followed by vancomycin 1000 mg IV Q12H. Goal AUC 400-550. Expected AUC: 449.1 Expected Css min: 13.0 SCr used: 0.8  Weight used: IBW, Vd used: 0.72 (BMI 32.91) Patient is also on Zosyn 3.375 g IV Q8H Patient is also on Fluconazole 200 mg daily x 7 days total  Continue to monitor renal function and follow culture results  Height: 5\' 6"  (167.6 cm) Weight: 92.7 kg (204 lb 5.9 oz) IBW/kg (Calculated) : 59.3  Temp (24hrs), Avg:99.5 F (37.5 C), Min:98.7 F (37.1 C), Max:101 F (38.3 C)  Recent Labs  Lab 09/03/23 0432 09/04/23 0458 09/05/23 0437 09/05/23 0946 09/06/23 0531 09/06/23 0954 09/07/23 0321  WBC 7.8 7.7 8.6  --   --  8.1 8.4  CREATININE 0.63 0.54 0.75  --  0.68  --  0.59  LATICACIDVEN  --   --   --  1.1  --   --   --     Estimated Creatinine Clearance: 92.3 mL/min (by C-G formula based on SCr of 0.59 mg/dL).    No Known Allergies  Antimicrobials this admission: 1/25 - 1/27 Zosyn 1/27 - 1/29 Augmentin 1/29 - 2/2 Bactrim 2/3 - 2/7 Vancomycin 2/10 Fluconazole >> end 2/16 2/11 Zosyn >> to end 2/16 2/11 Vancomycin >>  to end 2/16  Dose adjustments this admission: NA  Microbiology results: 1/23 MRSA PCR: negative 1/25 BCx: NGTD 1/26 Aspiration/Trach: GPC, MRSA  1/27 BAL: GPC MRSA  2/11 BCx: NGTD 2/11 Resp: Normal flora  Thank you for allowing pharmacy to be a part of this patient's care.  Effie Shy, PharmD Pharmacy Resident  09/07/2023 12:32 PM

## 2023-09-08 LAB — BASIC METABOLIC PANEL
Anion gap: 8 (ref 5–15)
BUN: 13 mg/dL (ref 6–20)
CO2: 35 mmol/L — ABNORMAL HIGH (ref 22–32)
Calcium: 8.9 mg/dL (ref 8.9–10.3)
Chloride: 99 mmol/L (ref 98–111)
Creatinine, Ser: 0.58 mg/dL (ref 0.44–1.00)
GFR, Estimated: >60 mL/min (ref >60)
Glucose, Bld: 176 mg/dL — ABNORMAL HIGH (ref 70–99)
Potassium: 3.8 mmol/L (ref 3.5–5.1)
Sodium: 142 mmol/L (ref 135–145)

## 2023-09-08 LAB — CBC
HCT: 29.5 % — ABNORMAL LOW (ref 36.0–46.0)
Hemoglobin: 9.2 g/dL — ABNORMAL LOW (ref 12.0–15.0)
MCH: 28.9 pg (ref 26.0–34.0)
MCHC: 31.2 g/dL (ref 30.0–36.0)
MCV: 92.8 fL (ref 80.0–100.0)
Platelets: 371 10*3/uL (ref 150–400)
RBC: 3.18 MIL/uL — ABNORMAL LOW (ref 3.87–5.11)
RDW: 12.6 % (ref 11.5–15.5)
WBC: 7 10*3/uL (ref 4.0–10.5)
nRBC: 0 % (ref 0.0–0.2)

## 2023-09-08 LAB — MAGNESIUM: Magnesium: 2 mg/dL (ref 1.7–2.4)

## 2023-09-08 LAB — GLUCOSE, CAPILLARY
Glucose-Capillary: 119 mg/dL — ABNORMAL HIGH (ref 70–99)
Glucose-Capillary: 120 mg/dL — ABNORMAL HIGH (ref 70–99)
Glucose-Capillary: 122 mg/dL — ABNORMAL HIGH (ref 70–99)
Glucose-Capillary: 165 mg/dL — ABNORMAL HIGH (ref 70–99)
Glucose-Capillary: 178 mg/dL — ABNORMAL HIGH (ref 70–99)
Glucose-Capillary: 209 mg/dL — ABNORMAL HIGH (ref 70–99)

## 2023-09-08 MED ORDER — BANATROL TF EN LIQD
60.0000 mL | Freq: Two times a day (BID) | ENTERAL | Status: DC
  Administered 2023-09-10 – 2023-09-13 (×4): 60 mL

## 2023-09-08 NOTE — Progress Notes (Signed)
Occupational Therapy Treatment Patient Details Name: Andrea Sanford MRN: [id number] DOB: 07-23-1971 Today's Date: 09/08/2023   History of present illness Pt is a 53 y/o F admitted on 08/17/23 after being found unresponsive in a personal vehicle. Pt is being treated for acute hypoxic respiratory failure 2/2 suspected drug overdose of undetermined intent requiring emergent intubation & mechanical ventilatory support. Pt failed multiple trials of extubation, pt had tracheostomy placed 09/01/23. Pt had gastrostomy tube placed 09/04/23. PMH: cocaine abuse, anxiety, depression, asthma, bipolar disorder, CA, COPD & OSA, DM2, HTN, neuromuscular disorder   OT comments  Andrea Sanford was seen for OT treatment on this date. Upon arrival to room pt semi-supine in bed, sleeping soundly, but wakes to VCs. Pt nods head vigorously when asked if she would like to engage in UB grooming tasks for OT tx session. OT facilitated ADL management with education and assist as described below. Pt positioned with bed in chair position t/o duration of session and tolerates well. See ADL section for additional details regarding occupational performance. Pt continues to be functionally limited by generalized weakness, decreased activity tolerance, and decreased balance. Pt attempts to communicate t/o session with varying degrees of success. She is limited by tracheostomy. Recommend SLP consult. She attempts to use pen and paper to write but is limited by cognition and decreased attention to task. Regularly gives up after writing one letter on notepad. Increased time taken to facilitate communication and maximize pt participation in session. Pt is progressing toward OT goals and continues to benefit from skilled OT services to maximize return to PLOF and minimize risk of future falls, injury, caregiver burden, and readmission. Will continue to follow POC as written. Discharge recommendation remains appropriate.        If plan is  discharge home, recommend the following:  Two people to help with walking and/or transfers;Two people to help with bathing/dressing/bathroom   Equipment Recommendations  Other (comment)    Recommendations for Other Services      Precautions / Restrictions Precautions Precautions: Fall Recall of Precautions/Restrictions: Impaired Restrictions Weight Bearing Restrictions Per Provider Order: No       Mobility Bed Mobility Overal bed mobility: Needs Assistance             General bed mobility comments: +2 MAX A to boost up in bed. With bed in chair position pt is able to independently adjust torso for comfort using bilateral hand rails.    Transfers                         Balance Overall balance assessment: Needs assistance                                         ADL either performed or assessed with clinical judgement   ADL Overall ADL's : Needs assistance/impaired Eating/Feeding: NPO   Grooming: Wash/dry face;Bed level;Sitting;Minimal assistance Grooming Details (indicate cue type and reason): Min A to support RUE, pt eager to perform face washing while in chair position in bed. Is able to progress to supervision for safety. Educated on positional strategies to maximize safety and independence. Pillows propped under RUE to support during functional activity.                               General ADL Comments: Functionally limited  by cognition and generalized weakness. Cognition appears improved from previous session, however, pt requires consistent cueing for sequencing functional tasks. Pt eager to speak t/o session and attempts writing but is unable to attend to task long enough to clearly communicate thoughts/needs. SLP notified pt may be good candidate for trial with passy-muir. Continues to require +2 MAX A for functional mobility. Is able to independently adjust self in bed once bed in chair position while using bilat hand  rails.    Extremity/Trunk Assessment Upper Extremity Assessment Upper Extremity Assessment: Generalized weakness   Lower Extremity Assessment Lower Extremity Assessment: Generalized weakness        Vision       Perception     Praxis     Communication Communication Communication: Impaired Factors Affecting Communication: Trach/intubated   Cognition Arousal: Lethargic Behavior During Therapy: Flat affect Cognition: No family/caregiver present to determine baseline, Cognition impaired     Awareness: Online awareness impaired, Intellectual awareness intact   Attention impairment (select first level of impairment): Focused attention Executive functioning impairment (select all impairments): Problem solving, Reasoning, Sequencing, Organization, Initiation OT - Cognition Comments: pt follows one step commands inconsistently- limited                 Following commands: Impaired Following commands impaired: Follows one step commands inconsistently, Follows one step commands with increased time      Cueing   Cueing Techniques: Verbal cues, Gestural cues  Exercises Other Exercises Other Exercises: OT facilitated ADL management with education and assist as described above. See ADL section for details.    Shoulder Instructions       General Comments      Pertinent Vitals/ Pain       Pain Assessment Pain Assessment: Faces Faces Pain Scale: Hurts a little bit Pain Location: Nods head yes when asked about pain but unable to localize through pointing or other means. Monitored during session. Pain Descriptors / Indicators: Discomfort Pain Intervention(s): Limited activity within patient's tolerance, Monitored during session, Repositioned  Home Living                                          Prior Functioning/Environment              Frequency  Min 1X/week        Progress Toward Goals  OT Goals(current goals can now be found in the  care plan section)  Progress towards OT goals: Progressing toward goals  Acute Rehab OT Goals Patient Stated Goal: Improving mobility OT Goal Formulation: With patient Time For Goal Achievement: 09/19/23 Potential to Achieve Goals: Good  Plan      Co-evaluation                 AM-PAC OT "6 Clicks" Daily Activity     Outcome Measure   Help from another person eating meals?: Total (NPO) Help from another person taking care of personal grooming?: A Little Help from another person toileting, which includes using toliet, bedpan, or urinal?: A Lot Help from another person bathing (including washing, rinsing, drying)?: A Lot Help from another person to put on and taking off regular upper body clothing?: A Lot Help from another person to put on and taking off regular lower body clothing?: A Lot 6 Click Score: 12    End of Session    OT Visit Diagnosis: Other abnormalities of gait  and mobility (R26.89);Muscle weakness (generalized) (M62.81);Unsteadiness on feet (R26.81)   Activity Tolerance Patient tolerated treatment well   Patient Left in bed;with call bell/phone within reach;with bed alarm set;with nursing/sitter in room   Nurse Communication Mobility status        Time: 9604-5409 OT Time Calculation (min): 31 min  Charges: OT General Charges $OT Visit: 1 Visit OT Treatments $Self Care/Home Management : 23-37 mins  Rockney Ghee, M.S., OTR/L 09/08/23, 2:33 PM

## 2023-09-08 NOTE — Progress Notes (Signed)
Pt transferred to room 251.  Uneventful transfer.  All pt belongings transferred with pt including dentures.  Erick Blinks, RN

## 2023-09-08 NOTE — Consult Note (Addendum)
PHARMACY CONSULT NOTE - ELECTROLYTES  Pharmacy Consult for Electrolyte Monitoring and Replacement   Recent Labs: Potassium (mmol/L)  Date Value  09/08/2023 3.8  10/15/2012 3.0 (L)   Magnesium (mg/dL)  Date Value  16/04/9603 2.0  10/04/2011 1.4 (L)   Calcium (mg/dL)  Date Value  54/03/8118 8.9   Calcium, Total (mg/dL)  Date Value  14/78/2956 8.3 (L)   Albumin (g/dL)  Date Value  21/30/8657 2.4 (L)  06/13/2023 4.3  10/15/2012 3.9   Phosphorus (mg/dL)  Date Value  84/69/6295 4.4   Sodium (mmol/L)  Date Value  09/08/2023 142  06/13/2023 142  10/15/2012 141   Height: 5\' 6"  (167.6 cm) Weight: 90.5 kg (199 lb 8.3 oz) IBW/kg (Calculated) : 59.3 Estimated Creatinine Clearance: 91.1 mL/min (by C-G formula based on SCr of 0.58 mg/dL).  Assessment  Andrea Sanford is a 53 y.o. female presenting with drug overdose / intoxication. PMH significant for polysubstance abuse, anxiety / depression, asthma / COPD, bipolar disorder, DM, neuromuscular disorder. Pharmacy has been consulted to monitor and replace electrolytes.  Goal of Therapy: Electrolytes within normal limits  Plan:  -- No replacement indicated today -- will dc CCM electrolytes monitoring. Patient currently on 2a.  Thank you for allowing pharmacy to be a part of this patient's care.  Jadrien Narine Rodriguez-Guzman PharmD, BCPS 09/08/2023 10:29 AM

## 2023-09-08 NOTE — Progress Notes (Signed)
Nutrition Follow-up  DOCUMENTATION CODES:   Severe malnutrition in context of acute illness/injury  INTERVENTION:   -TF via g-tube:  Osmolite 1.5 @ 60 ml/hr  60 ml Prosource TF daily  Tube feeding regimen provides 2240 kcal (100% of needs), 110 grams of protein, and 1097 ml of H2O.  Total free wtaer: 1277 ml daily  -Continue packet Juven BID via tube, each packet provides 95 calories, 2.5 grams of protein (collagen), and 9.8 grams of carbohydrate (3 grams sugar); also contains 7 grams of L-arginine and L-glutamine, 300 mg vitamin C, 15 mg vitamin E, 1.2 mcg vitamin B-12, 9.5 mg zinc, 200 mg calcium, and 1.5 g  Calcium Beta-hydroxy-Beta-methylbutyrate to support wound healing   -60 ml Banatrol TF BID  NUTRITION DIAGNOSIS:   Severe Malnutrition related to acute illness as evidenced by moderate fat depletion, severe fat depletion, moderate muscle depletion, severe muscle depletion.  Ongoing  GOAL:   Patient will meet greater than or equal to 90% of their needs  Met with TF  MONITOR:   TF tolerance  REASON FOR ASSESSMENT:   Ventilator    ASSESSMENT:   53 y/o female with h/o DM, bipolar disorder, substance abuse, anxiety, depression, COPD, OSA and homelessness who is admitted with AKI, overdose and aspiration PNA.  2/7- s/p trach 2/10- s/p g-tube  Reviewed I/O's: -1.4 L x 24 hours and +421 ml since 08/25/23  UOP: 3.1 L x 24 hours   Pt just transferred from ICU to floor.   Pt not interactive with RD. No family present.   Pt NPO and continues to receive TF via g-tube for sole source nutrition. Osmolite 1.5 infusing at goal rate of 60 ml/hr.   Wt has been stable over the past week.   Per TOC notes, [facility] unable to accept pt. Plan for SNF placement.   Medications reviewed and include lovenox, folic acid, neurontin, protonix, and thiamine.   Labs reviewed: CBGS: 119-209 (inpatient orders for glycemic control are 0-20 units insulin aspart every 4 hours and 10 units  insulin glarigne-yfgn daily).    Diet Order:   Diet Order     None       EDUCATION NEEDS:   No education needs have been identified at this time  Skin:  Skin Assessment: Reviewed RN Assessment (Stage II buttocks, laceration L knee) Skin Integrity Issues:: Stage I, Incisions Stage I: coccyx Incisions: closed neck s/p trach  Last BM:  09/08/23 (300 ml via rectal tube)  Height:   Ht Readings from Last 1 Encounters:  08/17/23 5\' 6"  (1.676 m)    Weight:   Wt Readings from Last 1 Encounters:  09/08/23 90.5 kg    Ideal Body Weight:  61.4 kg  BMI:  Body mass index is 32.2 kg/m.  Estimated Nutritional Needs:   Kcal:  1900-2200kcal/day  Protein:  95-110g/day  Fluid:  1.8-2.1L/day    Levada Schilling, RD, LDN, CDCES Registered Dietitian III Certified Diabetes Care and Education Specialist If unable to reach this RD, please use "RD Inpatient" group chat on secure chat between hours of 8am-4 pm daily

## 2023-09-08 NOTE — Progress Notes (Addendum)
PT Cancellation Note  Patient Details Name: Andrea Sanford MRN: [id number] DOB: 1971-05-04   Cancelled Treatment:    Reason Eval/Treat Not Completed: Patient at procedure or test/unavailable Patient just leaving room in bed moving to 251, she is also very lethargic. Will follow up later as time allows.   Oluwatomisin Hustead 09/08/2023, 10:19 AM

## 2023-09-08 NOTE — Plan of Care (Signed)
  Problem: Education: Goal: Ability to describe self-care measures that may prevent or decrease complications (Diabetes Survival Skills Education) will improve Outcome: Progressing   Problem: Coping: Goal: Ability to adjust to condition or change in health will improve Outcome: Progressing   Problem: Metabolic: Goal: Ability to maintain appropriate glucose levels will improve Outcome: Progressing   Problem: Nutritional: Goal: Maintenance of adequate nutrition will improve Outcome: Progressing   Problem: Skin Integrity: Goal: Risk for impaired skin integrity will decrease Outcome: Progressing   Problem: Clinical Measurements: Goal: Respiratory complications will improve Outcome: Progressing

## 2023-09-08 NOTE — Consult Note (Signed)
Pharmacy Antibiotic Note  Andrea Sanford is a 53 y.o. female admitted on 08/17/2023 with sepsis. Originally admitted to the ICU for acute hypoxia respiratory failure secondary to suspected drug overdose requiring emergent intubation and mechanical ventilatory support. They've had a complicated admission history with 2x failed extubations now s/p tracheostomy placement 2/7. They previously had Staph aureus positive tracheal aspirate and BAL which was treated with vancomycin for 5 days (2/3 - 2/7). They started fevering and placed on empiric antibiotics. New blood cultures ordered. Pharmacy has been consulted for Vancomycin and Zosyn dosing.  Today, 09/08/2023 Scr 0.58 stable WBC 7.0 Lactic acid 1.1 Procal 0.26 Afebrile last 24 hours  Plan: Day 1 of Vancomycin and Zosyn duration of therapy 5 days total to end 2/16 Gave LD of vancomycin 2000 mg x 1 Followed by vancomycin 1000 mg IV Q12H. Goal AUC 400-550. Expected AUC: 449.1 Expected Css min: 13.0 SCr used: 0.8  Weight used: IBW, Vd used: 0.72 (BMI 32.91) Patient is also on Zosyn 3.375 g IV Q8H Patient is also on Fluconazole 200 mg daily x 7 days total  Continue to monitor renal function and follow culture results  Height: 5\' 6"  (167.6 cm) Weight: 90.5 kg (199 lb 8.3 oz) IBW/kg (Calculated) : 59.3  Temp (24hrs), Avg:97.9 F (36.6 C), Min:97.7 F (36.5 C), Max:98.3 F (36.8 C)  Recent Labs  Lab 09/04/23 0458 09/05/23 0437 09/05/23 0946 09/06/23 0531 09/06/23 0954 09/07/23 0321 09/08/23 0436  WBC 7.7 8.6  --   --  8.1 8.4 7.0  CREATININE 0.54 0.75  --  0.68  --  0.59 0.58  LATICACIDVEN  --   --  1.1  --   --   --   --     Estimated Creatinine Clearance: 91.1 mL/min (by C-G formula based on SCr of 0.58 mg/dL).    No Known Allergies  Antimicrobials this admission: 1/25 - 1/27 Zosyn 1/27 - 1/29 Augmentin 1/29 - 2/2 Bactrim 2/3 - 2/7 Vancomycin 2/10 Fluconazole >> end 2/16 2/11 Zosyn >> to end 2/16 2/11 Vancomycin >>  to end 2/16  Dose adjustments this admission: NA  Microbiology results: 1/23 MRSA PCR: negative 1/25 BCx: NGTD 1/26 Aspiration/Trach: GPC, MRSA  1/27 BAL: GPC MRSA  2/11 BCx: NGTD 2/11 Resp: Normal flora  Thank you for allowing pharmacy to be a part of this patient's care.  Effie Shy, PharmD Pharmacy Resident  09/08/2023 2:12 PM
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
## 2023-09-09 DIAGNOSIS — T50904A Poisoning by unspecified drugs, medicaments and biological substances, undetermined, initial encounter: Secondary | ICD-10-CM | POA: Diagnosis not present

## 2023-09-09 LAB — CBC
HCT: 31 % — ABNORMAL LOW (ref 36.0–46.0)
Hemoglobin: 10 g/dL — ABNORMAL LOW (ref 12.0–15.0)
MCH: 29.2 pg (ref 26.0–34.0)
MCHC: 32.3 g/dL (ref 30.0–36.0)
MCV: 90.6 fL (ref 80.0–100.0)
Platelets: 412 10*3/uL — ABNORMAL HIGH (ref 150–400)
RBC: 3.42 MIL/uL — ABNORMAL LOW (ref 3.87–5.11)
RDW: 12.6 % (ref 11.5–15.5)
WBC: 7.6 10*3/uL (ref 4.0–10.5)
nRBC: 0 % (ref 0.0–0.2)

## 2023-09-09 LAB — BASIC METABOLIC PANEL
Anion gap: 8 (ref 5–15)
BUN: 12 mg/dL (ref 6–20)
CO2: 33 mmol/L — ABNORMAL HIGH (ref 22–32)
Calcium: 9.4 mg/dL (ref 8.9–10.3)
Chloride: 99 mmol/L (ref 98–111)
Creatinine, Ser: 0.66 mg/dL (ref 0.44–1.00)
GFR, Estimated: >60 mL/min (ref >60)
Glucose, Bld: 106 mg/dL — ABNORMAL HIGH (ref 70–99)
Potassium: 3.9 mmol/L (ref 3.5–5.1)
Sodium: 140 mmol/L (ref 135–145)

## 2023-09-09 LAB — GLUCOSE, CAPILLARY
Glucose-Capillary: 106 mg/dL — ABNORMAL HIGH (ref 70–99)
Glucose-Capillary: 128 mg/dL — ABNORMAL HIGH (ref 70–99)
Glucose-Capillary: 146 mg/dL — ABNORMAL HIGH (ref 70–99)
Glucose-Capillary: 150 mg/dL — ABNORMAL HIGH (ref 70–99)
Glucose-Capillary: 155 mg/dL — ABNORMAL HIGH (ref 70–99)
Glucose-Capillary: 166 mg/dL — ABNORMAL HIGH (ref 70–99)
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
## 2023-09-10 DIAGNOSIS — T50904A Poisoning by unspecified drugs, medicaments and biological substances, undetermined, initial encounter: Secondary | ICD-10-CM | POA: Diagnosis not present

## 2023-09-10 LAB — GLUCOSE, CAPILLARY
Glucose-Capillary: 139 mg/dL — ABNORMAL HIGH (ref 70–99)
Glucose-Capillary: 153 mg/dL — ABNORMAL HIGH (ref 70–99)
Glucose-Capillary: 147 mg/dL — ABNORMAL HIGH (ref 70–99)
Glucose-Capillary: 176 mg/dL — ABNORMAL HIGH (ref 70–99)
Glucose-Capillary: 129 mg/dL — ABNORMAL HIGH (ref 70–99)
Glucose-Capillary: 149 mg/dL — ABNORMAL HIGH (ref 70–99)
Glucose-Capillary: 136 mg/dL — ABNORMAL HIGH (ref 70–99)

## 2023-09-10 LAB — CULTURE, BLOOD (ROUTINE X 2)
Culture: NO GROWTH
Culture: NO GROWTH
Special Requests: ADEQUATE
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
## 2023-09-11 DIAGNOSIS — T50904A Poisoning by unspecified drugs, medicaments and biological substances, undetermined, initial encounter: Secondary | ICD-10-CM | POA: Diagnosis not present

## 2023-09-11 LAB — GLUCOSE, CAPILLARY
Glucose-Capillary: 119 mg/dL — ABNORMAL HIGH (ref 70–99)
Glucose-Capillary: 132 mg/dL — ABNORMAL HIGH (ref 70–99)
Glucose-Capillary: 144 mg/dL — ABNORMAL HIGH (ref 70–99)
Glucose-Capillary: 146 mg/dL — ABNORMAL HIGH (ref 70–99)
Glucose-Capillary: 149 mg/dL — ABNORMAL HIGH (ref 70–99)
Glucose-Capillary: 169 mg/dL — ABNORMAL HIGH (ref 70–99)
Glucose-Capillary: 174 mg/dL — ABNORMAL HIGH (ref 70–99)

## 2023-09-11 MED ORDER — CEFAZOLIN SODIUM-DEXTROSE 2-4 GM/100ML-% IV SOLN
2.0000 g | INTRAVENOUS | Status: AC
  Filled 2023-09-11: qty 100

## 2023-09-11 MED ORDER — OXYCODONE HCL 5 MG PO TABS
15.0000 mg | ORAL_TABLET | Freq: Four times a day (QID) | ORAL | Status: DC
  Administered 2023-09-11 – 2023-09-12 (×3): 15 mg
  Filled 2023-09-11 (×3): qty 3

## 2023-09-11 NOTE — Progress Notes (Signed)
Physical Therapy Treatment Patient Details Name: Andrea Sanford MRN: [id number] DOB: [birth year] Today's Date: 09/11/2023   History of Present Illness Pt is a 53 y/o F admitted on 08/17/23 after being found unresponsive in a personal vehicle. Pt is being treated for acute hypoxic respiratory failure 2/2 suspected drug overdose of undetermined intent requiring emergent intubation & mechanical ventilatory support. Pt failed multiple trials of extubation, pt had tracheostomy placed 09/01/23. Pt had gastrostomy tube placed 09/04/23. PMH: cocaine abuse, anxiety, depression, asthma, bipolar disorder, CA, COPD & OSA, DM2, HTN, neuromuscular disorder    PT Comments  Pt is making good progress towards goals with ability to improve bed mobility with decreased physical assistance. Pt still remains on HFNC via trach collar and is limited in mobility due to coughing and need for suctioning. Pt is hopeful for speaking valve to assist in communication. Able to perform seated ADLs and there-ex at bedside. Will continue to progress.    If plan is discharge home, recommend the following: Help with stairs or ramp for entrance;Direct supervision/assist for medications management;Assistance with feeding;Assist for transportation;Direct supervision/assist for financial management;Assistance with cooking/housework;Supervision due to cognitive status;A lot of help with walking and/or transfers;A lot of help with bathing/dressing/bathroom   Can travel by private vehicle     No  Equipment Recommendations   (TBD)    Recommendations for Other Services       Precautions / Restrictions Precautions Precautions: Fall Recall of Precautions/Restrictions: Impaired Restrictions Weight Bearing Restrictions Per Provider Order: No     Mobility  Bed Mobility Overal bed mobility: Needs Assistance Bed Mobility: Supine to Sit     Supine to sit: Min assist Sit to supine: Min assist   General bed mobility comments: improved  technique with slight impulsive nature. Once seated, upright posture noted. Pt able to perform ADLs sitting at bedside. + coughing and needed to be suctioned-returned back supine for RN to suction    Transfers                   General transfer comment: not able due to nursing in room for suctioning    Ambulation/Gait                   Stairs             Wheelchair Mobility     Tilt Bed    Modified Rankin (Stroke Patients Only)       Balance Overall balance assessment: Needs assistance Sitting-balance support: Feet supported Sitting balance-Leahy Scale: Fair                                      Hotel manager: Impaired Factors Affecting Communication: Trach/intubated  Cognition Arousal: Alert Behavior During Therapy: Restless   PT - Cognitive impairments: Orientation, Awareness, Memory, Attention, Initiation, Sequencing, Problem solving, Safety/Judgement, Difficult to assess                       PT - Cognition Comments: appears to be aware of person/place. Disoriented to situation Following commands: Intact      Cueing Cueing Techniques: Verbal cues, Gestural cues  Exercises Other Exercises Other Exercises: pt able to perform LAQ x B LE and alt marching x 10 reps Other Exercises: sat at EOB and worked on combing hair. O2 sats monitored throughout    General Comments  Pertinent Vitals/Pain Pain Assessment Pain Assessment: No/denies pain    Home Living                          Prior Function            PT Goals (current goals can now be found in the care plan section) Acute Rehab PT Goals Patient Stated Goal: to go home PT Goal Formulation: Patient unable to participate in goal setting Time For Goal Achievement: 09/19/23 Potential to Achieve Goals: Fair Progress towards PT goals: Progressing toward goals    Frequency    Min 1X/week      PT Plan       Co-evaluation              AM-PAC PT "6 Clicks" Mobility   Outcome Measure  Help needed turning from your back to your side while in a flat bed without using bedrails?: A Little Help needed moving from lying on your back to sitting on the side of a flat bed without using bedrails?: A Little Help needed moving to and from a bed to a chair (including a wheelchair)?: A Lot Help needed standing up from a chair using your arms (e.g., wheelchair or bedside chair)?: A Lot Help needed to walk in hospital room?: Total Help needed climbing 3-5 steps with a railing? : Total 6 Click Score: 12    End of Session Equipment Utilized During Treatment: Oxygen Activity Tolerance: Patient tolerated treatment well Patient left: in bed;with bed alarm set Nurse Communication: Mobility status PT Visit Diagnosis: Muscle weakness (generalized) (M62.81);Other abnormalities of gait and mobility (R26.89)     Time: 7829-5621 PT Time Calculation (min) (ACUTE ONLY): 14 min  Charges:    $Therapeutic Exercise: 8-22 mins PT General Charges $$ ACUTE PT VISIT: 1 Visit                     Elizabeth Palau, PT, DPT, GCS [phone]    Andrea Sanford 09/11/2023, 4:59 PM

## 2023-09-11 NOTE — Plan of Care (Signed)
  Problem: Education: Goal: Ability to describe self-care measures that may prevent or decrease complications (Diabetes Survival Skills Education) will improve Outcome: Not Progressing   Problem: Coping: Goal: Ability to adjust to condition or change in health will improve Outcome: Not Progressing   Problem: Fluid Volume: Goal: Ability to maintain a balanced intake and output will improve Outcome: Not Progressing   Problem: Health Behavior/Discharge Planning: Goal: Ability to identify and utilize available resources and services will improve Outcome: Not Progressing Goal: Ability to manage health-related needs will improve Outcome: Not Progressing   Problem: Metabolic: Goal: Ability to maintain appropriate glucose levels will improve Outcome: Not Progressing   Problem: Nutritional: Goal: Maintenance of adequate nutrition will improve Outcome: Not Progressing Goal: Progress toward achieving an optimal weight will improve Outcome: Not Progressing   Problem: Skin Integrity: Goal: Risk for impaired skin integrity will decrease Outcome: Not Progressing   Problem: Tissue Perfusion: Goal: Adequacy of tissue perfusion will improve Outcome: Not Progressing   Problem: Education: Goal: Knowledge of General Education information will improve Description: Including pain rating scale, medication(s)/side effects and non-pharmacologic comfort measures Outcome: Not Progressing   Problem: Health Behavior/Discharge Planning: Goal: Ability to manage health-related needs will improve Outcome: Not Progressing   Problem: Clinical Measurements: Goal: Ability to maintain clinical measurements within normal limits will improve Outcome: Not Progressing Goal: Will remain free from infection Outcome: Not Progressing Goal: Diagnostic test results will improve Outcome: Not Progressing Goal: Respiratory complications will improve Outcome: Not Progressing Goal: Cardiovascular complication will  be avoided Outcome: Not Progressing   Problem: Activity: Goal: Risk for activity intolerance will decrease Outcome: Not Progressing   Problem: Nutrition: Goal: Adequate nutrition will be maintained Outcome: Not Progressing   Problem: Coping: Goal: Level of anxiety will decrease Outcome: Not Progressing   Problem: Elimination: Goal: Will not experience complications related to bowel motility Outcome: Not Progressing Goal: Will not experience complications related to urinary retention Outcome: Not Progressing   Problem: Pain Managment: Goal: General experience of comfort will improve and/or be controlled Outcome: Not Progressing   Problem: Safety: Goal: Ability to remain free from injury will improve Outcome: Not Progressing   Problem: Skin Integrity: Goal: Risk for impaired skin integrity will decrease Outcome: Not Progressing   Problem: Education: Goal: Knowledge of disease or condition will improve Outcome: Not Progressing Goal: Knowledge of the prescribed therapeutic regimen will improve Outcome: Not Progressing   Problem: Activity: Goal: Ability to tolerate increased activity will improve Outcome: Not Progressing Goal: Will verbalize the importance of balancing activity with adequate rest periods Outcome: Not Progressing   Problem: Respiratory: Goal: Ability to maintain a clear airway will improve Outcome: Not Progressing Goal: Levels of oxygenation will improve Outcome: Not Progressing Goal: Ability to maintain adequate ventilation will improve Outcome: Not Progressing   Problem: Activity: Goal: Ability to tolerate increased activity will improve Outcome: Not Progressing   Problem: Respiratory: Goal: Ability to maintain a clear airway and adequate ventilation will improve Outcome: Not Progressing   Problem: Role Relationship: Goal: Method of communication will improve Outcome: Not Progressing

## 2023-09-11 NOTE — Plan of Care (Signed)
  Problem: Education: Goal: Ability to describe self-care measures that may prevent or decrease complications (Diabetes Survival Skills Education) will improve Outcome: Progressing   Problem: Coping: Goal: Ability to adjust to condition or change in health will improve Outcome: Progressing   Problem: Fluid Volume: Goal: Ability to maintain a balanced intake and output will improve Outcome: Progressing   Problem: Health Behavior/Discharge Planning: Goal: Ability to identify and utilize available resources and services will improve Outcome: Progressing Goal: Ability to manage health-related needs will improve Outcome: Progressing   Problem: Metabolic: Goal: Ability to maintain appropriate glucose levels will improve Outcome: Progressing   Problem: Nutritional: Goal: Maintenance of adequate nutrition will improve Outcome: Progressing Goal: Progress toward achieving an optimal weight will improve Outcome: Progressing   Problem: Skin Integrity: Goal: Risk for impaired skin integrity will decrease Outcome: Progressing   Problem: Tissue Perfusion: Goal: Adequacy of tissue perfusion will improve Outcome: Progressing   Problem: Education: Goal: Knowledge of General Education information will improve Description: Including pain rating scale, medication(s)/side effects and non-pharmacologic comfort measures Outcome: Progressing   Problem: Health Behavior/Discharge Planning: Goal: Ability to manage health-related needs will improve Outcome: Progressing   Problem: Clinical Measurements: Goal: Ability to maintain clinical measurements within normal limits will improve Outcome: Progressing Goal: Will remain free from infection Outcome: Progressing Goal: Diagnostic test results will improve Outcome: Progressing Goal: Respiratory complications will improve Outcome: Progressing Goal: Cardiovascular complication will be avoided Outcome: Progressing   Problem: Activity: Goal:  Risk for activity intolerance will decrease Outcome: Progressing   Problem: Nutrition: Goal: Adequate nutrition will be maintained Outcome: Progressing   Problem: Coping: Goal: Level of anxiety will decrease Outcome: Progressing   Problem: Elimination: Goal: Will not experience complications related to bowel motility Outcome: Progressing Goal: Will not experience complications related to urinary retention Outcome: Progressing   Problem: Pain Managment: Goal: General experience of comfort will improve and/or be controlled Outcome: Progressing   Problem: Safety: Goal: Ability to remain free from injury will improve Outcome: Progressing   Problem: Skin Integrity: Goal: Risk for impaired skin integrity will decrease Outcome: Progressing   Problem: Education: Goal: Knowledge of disease or condition will improve Outcome: Progressing Goal: Knowledge of the prescribed therapeutic regimen will improve Outcome: Progressing   Problem: Activity: Goal: Ability to tolerate increased activity will improve Outcome: Progressing Goal: Will verbalize the importance of balancing activity with adequate rest periods Outcome: Progressing   Problem: Respiratory: Goal: Ability to maintain a clear airway will improve Outcome: Progressing Goal: Levels of oxygenation will improve Outcome: Progressing Goal: Ability to maintain adequate ventilation will improve Outcome: Progressing   Problem: Activity: Goal: Ability to tolerate increased activity will improve Outcome: Progressing   Problem: Respiratory: Goal: Ability to maintain a clear airway and adequate ventilation will improve Outcome: Progressing   Problem: Role Relationship: Goal: Method of communication will improve Outcome: Progressing
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
## 2023-09-12 ENCOUNTER — Other Ambulatory Visit: Payer: Self-pay | Admitting: Podiatry

## 2023-09-12 DIAGNOSIS — T50904A Poisoning by unspecified drugs, medicaments and biological substances, undetermined, initial encounter: Secondary | ICD-10-CM | POA: Diagnosis not present

## 2023-09-12 LAB — GLUCOSE, CAPILLARY
Glucose-Capillary: 130 mg/dL — ABNORMAL HIGH (ref 70–99)
Glucose-Capillary: 144 mg/dL — ABNORMAL HIGH (ref 70–99)
Glucose-Capillary: 113 mg/dL — ABNORMAL HIGH (ref 70–99)
Glucose-Capillary: 126 mg/dL — ABNORMAL HIGH (ref 70–99)
Glucose-Capillary: 149 mg/dL — ABNORMAL HIGH (ref 70–99)

## 2023-09-12 MED ORDER — OXYCODONE HCL 5 MG PO TABS
15.0000 mg | ORAL_TABLET | Freq: Three times a day (TID) | ORAL | Status: DC
  Administered 2023-09-12 – 2023-09-14 (×6): 15 mg
  Filled 2023-09-12 (×6): qty 3
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
## 2023-09-12 NOTE — TOC Progression Note (Signed)
Transition of Care [facility]) - Progression Note    Patient Details  Name: Andrea Sanford MRN: [id number] Date of Birth: 29-Dec-1970  [facility] CM/SW Contact  Truddie Hidden, RN Phone Number: 09/12/2023, 2:31 PM  Clinical Narrative:    No bed offers.         Expected Discharge Plan and Services                                               Social Determinants of Health (SDOH) Interventions SDOH Screenings   Food Insecurity: Patient Unable To Answer (08/17/2023)  Housing: Patient Unable To Answer (08/17/2023)  Transportation Needs: Patient Unable To Answer (08/17/2023)  Utilities: Patient Unable To Answer (08/17/2023)  Alcohol Screen: Low Risk  (06/13/2023)  Depression (PHQ2-9): High Risk (06/13/2023)  Financial Resource Strain: High Risk (08/29/2017)  Physical Activity: Inactive (08/29/2017)  Social Connections: Moderately Isolated (08/29/2017)  Stress: Stress Concern Present (08/29/2017)  Tobacco Use: High Risk (08/18/2023)    Readmission Risk Interventions     No data to display
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
## 2023-09-12 NOTE — Evaluation (Signed)
[facility] - Evaluation Patient Details  Name: Andrea Sanford MRN: [id number] Date of Birth: 06/01/1971  Today's Date: 09/12/2023 Time: 1420-1545 SLP Time Calculation (min) (ACUTE ONLY): 85 min  Past Medical History:  Past Medical History:  Diagnosis Date   Allergy    Anxiety    Arthritis    Asthma    Bipolar 1 disorder (HCC)    Cancer (HCC)    COPD (chronic obstructive pulmonary disease) (HCC)    Depression    Diabetes mellitus without complication (HCC)    Hypertension    Neuromuscular disorder (HCC)    Osteoporosis    Sleep apnea    Past Surgical History:  Past Surgical History:  Procedure Laterality Date   ANKLE SURGERY Right    IR GASTROSTOMY TUBE MOD SED  09/04/2023   PERIPHERAL VASCULAR THROMBECTOMY Left 1991   TRACHEOSTOMY TUBE PLACEMENT N/A 09/01/2023   Procedure: TRACHEOSTOMY;  Surgeon: Geanie Logan, MD;  Location: [facility] ORS;  Service: ENT;  Laterality: N/A;   HPI:  Pt is a 53 y/o F admitted on 08/17/23 after being found unresponsive in a personal vehicle. Pt is being treated for acute hypoxic respiratory failure 2/2 suspected drug overdose of undetermined intent requiring emergent intubation & mechanical ventilatory support. Pt failed multiple trials of extubation, pt had tracheostomy placed 09/01/23. Pt had gastrostomy tube placed 09/04/23. PMH: cocaine abuse, anxiety, depression, asthma, bipolar disorder, CA, COPD & OSA, DM2, HTN, neuromuscular disorder.  Per request for this evaluation, ENT was consult for downsize of trach to a Shiley #6, uncuffed -- this was performed this morning w/ good success and tolerance.    Assessment / Plan / Recommendation  Clinical Impression   Pt seen for both PMV evaluation and BSE (assessment) today. Pt continues on TC per chart; recent downsize of trach to a Shile #6, Cuffless this morning post consult w/ MD/ENT.  Pt awake, alert and followed instructions w/ cue/support. A/O x3. Slighlty muttered speech w/ decreased  articulation -- Edentulous. Pt eager to talk and to eat a meal. She sat herself EOB w/ minimal support. She was mouthing and able to talk a little around the trach.   On TC O2 support of 8L at 40%; afebrile. WBC wnl.   Explained the use and wear of the PMV to pt; trach and stoma area inspected; ensured Cuffless trach b/f placing PMV. Completed finger occlusion trials w/ good phonation and voicing w/ appropriate breath support. Pt seemed eager to hear herself talk "again". Pt's ANS and respiratory effort: RR: 19-22 moving about in bed, O2 sats 94-99%, HR 80-90s.  PMV placed w/out difficulty. Pt immediately verbalized and maintained conversation w/ this SLP. Verbalizations and conversation were c/b adequate volume and intelligibility w/ appropriate breath support/effort for speech. PMV placement was tolerated during both sessions w/out noted O2 desaturation nor significant change in RR/HR from her baseline. No overt discomfort noted in her respiratory effort -- pt stated it felt "fine" to wear/talk w/. No increased effort noted in respirations during the expiratory phase; conversation. No overt use of accessary muscles noted in her breathing pattern. PMV was allowed to remain on as pt consumed po's. RR 20; O2 sats 98%; HR 92.    Pt appears to adequately tolerate PMV placement w/out overt respiratory discomfort or distress; ANS remained stable at her Baseline during wear/use. Suspect this Shiley trach size, #6, is beneficial for comfortable PMV wear/use.  Much education was given on PMV use/wear, checking for secretions/cleaning, placing/removing the PMV, NO SLEEPING W/  PMV PLACED, and overall care of the PMV. Discussed that it MUST be worn for all eating/drinking; and can be worn w/ Therapies(OT, PT). Encouraged Rest Breaks at times during the day. Precautions posted at bedside and in chart.  NSG made aware. MD updated. ST services will continue to monitor for any further needs while admitted.     BSE:  Pt  was seen for assessment of swallowing also w/ trials of ice chips for initial oral intake. Pt has been NPO since admit d/t intubation/now tracheostomy. She appears able to wear the PMV comfortably for verbal communication and for po assessment. See session noted above.    Pt explained general aspiration precautions and agreed verbally to the need for following them especially sitting upright for all oral intake, No Talking w/ food and drink in mouth, and wearing PMV for ALL oral intake. Pt assisted w/ sitting up on EOB for po trials. Pt appears to present w/ functional oropharyngeal phase swallowing w/ No oropharyngeal phase dysphagia noted w/ trials of ice chips, No neuromuscular deficits noted. Pt consumed po trials w/ No overt, clinical s/s of aspiration during the trials.  Pt appears at reduced risk for aspiration when following general aspiration precautions and wearing PMV for all oral intake. However, pt does have challenging factors that could impact her oropharyngeal swallowing to include lengthy illness/hospitalization, tracheostomy, Pulmonary decline, deconditioning/weakness, and substance abuse. These factors can increase risk for dysphagia as well as decreased oral intake overall. During po trials, pt consumed trials of ice chips (only at this eval today) w/ no overt coughing, decline in vocal quality, or change in respiratory presentation during/post trials. O2 sats mid 90s during. Oral phase appeared [facility] w/ timely bolus management, mastication, and control of bolus propulsion for A-P transfer for swallowing. Oral clearing achieved w/ trials. OM Exam appeared [facility] w/ no unilateral weakness noted. Speech Clear. Pt fed self w/ setup support.   Recommend initiation of PRN ice chips to engage swallowing and ready pt for trials of foods/liquids tomorrow. PMV MUST BE WORN FOR ALL ORAL INTAKE. Aspiration precautions. Eat chips slowly; small bites/pieces. Supervision by NSG staff.  Discussed pt's  presentation w/ NSG and RT. . NSG and MD updated on above. Precautions posted at bedside for PMV wear/use and aspiration precautions. SLP Visit Diagnosis: Dysphagia, unspecified (R13.10);Aphonia (R49.1) (Tracheostomy)    SLP Assessment  Patient needs continued Speech Lanaguage Pathology Services    Recommendations for follow up therapy are one component of a multi-disciplinary discharge planning process, led by the attending physician.  Recommendations may be updated based on patient status, additional functional criteria and insurance authorization.  Follow Up Recommendations  No SLP follow up (expected at d/c)    Assistance Recommended at Discharge Frequent or constant Supervision/Assistance (currently)  Functional Status Assessment Patient has had a recent decline in their functional status and demonstrates the ability to make significant improvements in function in a reasonable and predictable amount of time.  Frequency and Duration min 2x/week  2 weeks    PMSV Trial PMSV was placed for: 60 mins Able to redirect subglottic air through upper airway: Yes Able to Attain Phonation: Yes Voice Quality: Normal Able to Expectorate Secretions: Yes Level of Secretion Expectoration with PMSV: Oral Breath Support for Phonation: Adequate Intelligibility: Intelligible Respirations During Trial: 20 SpO2 During Trial: 98 % (8L) Pulse During Trial: 92 Behavior: Alert;Cooperative;Expresses self well;Good eye contact;Responsive to questions   Tracheostomy Tube  Additional Tracheostomy Tube Assessment Trach Collar Period: baseline Secretion Description: slight-min  Frequency of Tracheal Suctioning: prn Level of Secretion Expectoration: Tracheal (stoma)    Vent Dependency  Vent Dependent: No FiO2 (%): 40 %    Cuff Deflation Trial Tolerated Cuff Deflation:  (cuffless trach baseline)         Andrea Sanford 09/12/2023, 5:44 PM Jerilynn Som, MS, CCC-SLP Speech Language  Pathologist Rehab Services; [facility] [phone] (ascom)

## 2023-09-12 NOTE — Plan of Care (Signed)
  Problem: Coping: Goal: Ability to adjust to condition or change in health will improve Outcome: Progressing   Problem: Fluid Volume: Goal: Ability to maintain a balanced intake and output will improve Outcome: Progressing   Problem: Metabolic: Goal: Ability to maintain appropriate glucose levels will improve Outcome: Progressing   Problem: Nutritional: Goal: Maintenance of adequate nutrition will improve Outcome: Progressing Goal: Progress toward achieving an optimal weight will improve Outcome: Progressing   Problem: Skin Integrity: Goal: Risk for impaired skin integrity will decrease Outcome: Progressing   Problem: Tissue Perfusion: Goal: Adequacy of tissue perfusion will improve Outcome: Progressing   Problem: Clinical Measurements: Goal: Will remain free from infection Outcome: Progressing   Problem: Nutrition: Goal: Adequate nutrition will be maintained Outcome: Progressing   Problem: Coping: Goal: Level of anxiety will decrease Outcome: Progressing

## 2023-09-12 NOTE — Progress Notes (Signed)
Andrea Sanford, Andrea Sanford [id number] 1971-02-07 Sandi Mealy, MD   SUBJECTIVE: This 53 y.o. year old female is status post tracheostomy, now off vent and ready for trach downsize for talking purposes.   Medications:  Current Facility-Administered Medications  Medication Dose Route Frequency Provider Last Rate Last Admin   acetaminophen (TYLENOL) tablet 650 mg  650 mg Per Tube Q4H PRN Rust-Chester, Cecelia Byars, NP   650 mg at 09/05/23 0806   Chlorhexidine Gluconate Cloth 2 % PADS 6 each  6 each Topical Daily Erin Fulling, MD   6 each at 09/12/23 0858   diazepam (VALIUM) injection 5 mg  5 mg Intravenous Q6H PRN Ezequiel Essex, NP   5 mg at 09/11/23 0829   docusate (COLACE) 50 MG/5ML liquid 100 mg  100 mg Per Tube BID PRN Rust-Chester, Micheline Rough L, NP       enoxaparin (LOVENOX) injection 45 mg  45 mg Subcutaneous Q24H Assaker, Jean-Pierre, MD   45 mg at 09/11/23 1439   feeding supplement (OSMOLITE 1.5 CAL) liquid 1,000 mL  1,000 mL Per Tube Continuous Assaker, [place] Bali, MD 60 mL/hr at 09/11/23 1538 1,000 mL at 09/11/23 1538   feeding supplement (PROSource TF20) liquid 60 mL  60 mL Per Tube Daily Assaker, [place]ali, MD   60 mL at 09/12/23 0849   fiber supplement (BANATROL TF) liquid 60 mL  60 mL Per Tube BID Assaker, [place], MD   60 mL at 09/12/23 0849   folic acid (FOLVITE) tablet 1 mg  1 mg Per Tube Daily Rust-Chester, Britton L, NP   1 mg at 09/12/23 0850   free water 30 mL  30 mL Per Tube Q4H Assaker, [place]ali, MD   30 mL at 09/12/23 0853   gabapentin (NEURONTIN) 250 MG/5ML solution 800 mg  800 mg Per Tube Q8H Aleskerov, Fuad, MD   800 mg at 09/12/23 0548   insulin aspart (novoLOG) injection 0-20 Units  0-20 Units Subcutaneous Q4H Rust-Chester, Britton L, NP   3 Units at 09/12/23 0858   insulin glargine-yfgn (SEMGLEE) injection 10 Units  10 Units Subcutaneous Daily Jimmye Norman, NP   10 Units at 09/12/23 0853   ipratropium-albuterol (DUONEB) 0.5-2.5 (3) MG/3ML nebulizer solution 3  mL  3 mL Nebulization Q6H PRN Vida Rigger, MD       nutrition supplement (JUVEN) (JUVEN) powder packet 1 packet  1 packet Per Tube BID BM Vida Rigger, MD   1 packet at 09/12/23 0849   ondansetron (ZOFRAN) injection 4 mg  4 mg Intravenous Q6H PRN Rust-Chester, Britton L, NP   4 mg at 08/23/23 2210   oxyCODONE (Oxy IR/ROXICODONE) immediate release tablet 15 mg  15 mg Per Tube Q6H Lurene Shadow, MD   15 mg at 09/12/23 0850   pantoprazole (PROTONIX) injection 40 mg  40 mg Intravenous Q24H Erin Fulling, MD   40 mg at 09/10/23 1754   polyethylene glycol (MIRALAX / GLYCOLAX) packet 17 g  17 g Per Tube Daily Assaker, [place] Bali, MD   17 g at 09/09/23 0911   QUEtiapine (SEROQUEL) tablet 400 mg  400 mg Per Tube BID Assaker, [place] Bali, MD   400 mg at 09/12/23 0849   sodium chloride flush (NS) 0.9 % injection 10-40 mL  10-40 mL Intracatheter Q12H Kasa, Wallis Bamberg, MD   10 mL at 09/12/23 0854   sodium chloride flush (NS) 0.9 % injection 10-40 mL  10-40 mL Intracatheter PRN Erin Fulling, MD       thiamine (VITAMIN B1) tablet 100 mg  100  mg Per Tube Daily Tressie Ellis, RPH   100 mg at 09/12/23 0850   venlafaxine [facility]) tablet 75 mg  75 mg Per Tube BID Vida Rigger, MD   75 mg at 09/12/23 0850  .  Medications Prior to Admission  Medication Sig Dispense Refill   clonazePAM (KLONOPIN) 1 MG tablet Take 1 mg by mouth 2 (two) times daily as needed for anxiety.     gabapentin (NEURONTIN) 800 MG tablet TAKE 1 TABLET BY MOUTH 3 TIMES DAILY AS NEEDED. 90 tablet 0   naproxen (NAPROSYN) 500 MG tablet Take 1 tablet (500 mg total) by mouth 2 (two) times daily with a meal. 60 tablet 0   QUEtiapine (SEROQUEL) 400 MG tablet Take 400 mg by mouth at bedtime.     venlafaxine XR (EFFEXOR-XR) 150 MG 24 hr capsule Take 150 mg by mouth every morning.     VRAYLAR 3 MG capsule Take 3 mg by mouth daily.      OBJECTIVE:  PHYSICAL EXAM  Vitals: Blood pressure 117/78, pulse 83, temperature 98.6 F (37 C), temperature  source Oral, resp. rate 15, height 5\' 6"  (1.676 m), weight 86.5 kg, SpO2 96%.. General: Well-developed, Well-nourished in no acute distress Mood: Mood and affect well adjusted, pleasant and cooperative. Orientation: Grossly alert  Vocal Quality: aphonic until trach changed out, able to vocalize after. head and Face: NCAT. No facial asymmetry. No visible skin lesions. No significant facial scars. No tenderness with sinus percussion. Facial strength normal and symmetric. Neck: Supple and symmetric with no palpable masses, tenderness or crepitance. The trach stoma is clean, no infection or granulation. Trach downsized to a #6 Shiley cuffless trach without difficulty. No difficulty breathing.   MEDICAL DECISION MAKING: Data Review:  Results for orders placed or performed during the hospital encounter of 08/17/23 (from the past 48 hours)  Glucose, capillary     Status: Abnormal   Collection Time: 09/10/23 12:54 PM  Result Value Ref Range   Glucose-Capillary 176 (H) 70 - 99 mg/dL    Comment: Glucose reference range applies only to samples taken after fasting for at least 8 hours.   Comment 1 Notify RN   Glucose, capillary     Status: Abnormal   Collection Time: 09/10/23  5:53 PM  Result Value Ref Range   Glucose-Capillary 129 (H) 70 - 99 mg/dL    Comment: Glucose reference range applies only to samples taken after fasting for at least 8 hours.  Glucose, capillary     Status: Abnormal   Collection Time: 09/10/23  8:13 PM  Result Value Ref Range   Glucose-Capillary 149 (H) 70 - 99 mg/dL    Comment: Glucose reference range applies only to samples taken after fasting for at least 8 hours.  Glucose, capillary     Status: Abnormal   Collection Time: 09/10/23  9:36 PM  Result Value Ref Range   Glucose-Capillary 136 (H) 70 - 99 mg/dL    Comment: Glucose reference range applies only to samples taken after fasting for at least 8 hours.  Glucose, capillary     Status: Abnormal   Collection Time:  09/11/23  1:08 AM  Result Value Ref Range   Glucose-Capillary 119 (H) 70 - 99 mg/dL    Comment: Glucose reference range applies only to samples taken after fasting for at least 8 hours.  Glucose, capillary     Status: Abnormal   Collection Time: 09/11/23  3:31 AM  Result Value Ref Range   Glucose-Capillary 132 (H)  70 - 99 mg/dL    Comment: Glucose reference range applies only to samples taken after fasting for at least 8 hours.  Glucose, capillary     Status: Abnormal   Collection Time: 09/11/23  7:32 AM  Result Value Ref Range   Glucose-Capillary 144 (H) 70 - 99 mg/dL    Comment: Glucose reference range applies only to samples taken after fasting for at least 8 hours.  Glucose, capillary     Status: Abnormal   Collection Time: 09/11/23 12:16 PM  Result Value Ref Range   Glucose-Capillary 174 (H) 70 - 99 mg/dL    Comment: Glucose reference range applies only to samples taken after fasting for at least 8 hours.  Glucose, capillary     Status: Abnormal   Collection Time: 09/11/23  4:54 PM  Result Value Ref Range   Glucose-Capillary 146 (H) 70 - 99 mg/dL    Comment: Glucose reference range applies only to samples taken after fasting for at least 8 hours.  Glucose, capillary     Status: Abnormal   Collection Time: 09/11/23  7:41 PM  Result Value Ref Range   Glucose-Capillary 149 (H) 70 - 99 mg/dL    Comment: Glucose reference range applies only to samples taken after fasting for at least 8 hours.  Glucose, capillary     Status: Abnormal   Collection Time: 09/11/23 11:30 PM  Result Value Ref Range   Glucose-Capillary 169 (H) 70 - 99 mg/dL    Comment: Glucose reference range applies only to samples taken after fasting for at least 8 hours.  Glucose, capillary     Status: Abnormal   Collection Time: 09/12/23  5:39 AM  Result Value Ref Range   Glucose-Capillary 130 (H) 70 - 99 mg/dL    Comment: Glucose reference range applies only to samples taken after fasting for at least 8 hours.   Glucose, capillary     Status: Abnormal   Collection Time: 09/12/23  8:53 AM  Result Value Ref Range   Glucose-Capillary 144 (H) 70 - 99 mg/dL    Comment: Glucose reference range applies only to samples taken after fasting for at least 8 hours.  . No results found..   ASSESSMENT: Off vent. Trach downsized to [employer] #6 cuffless trach.   PLAN: Patient may be fitted with PMV, although, if trach removal is anticipated, team could simply cap trach to assess respiratory stability while capped for 24 hours. Decision to remove trach should be made based on overall pulmonary progress, lack of any aspiration concerns. Would not remove trach if patient deemed to have any high risk for respiratory decompensation in the near future.    Sandi Mealy, MD 09/12/2023 10:54 AMPatient ID: Andrea Sanford, female   DOB: 09/26/1970, 53 y.o.   MRN: [id number]

## 2023-09-13 DIAGNOSIS — T50904A Poisoning by unspecified drugs, medicaments and biological substances, undetermined, initial encounter: Secondary | ICD-10-CM | POA: Diagnosis not present

## 2023-09-13 DIAGNOSIS — J9601 Acute respiratory failure with hypoxia: Secondary | ICD-10-CM | POA: Diagnosis not present

## 2023-09-13 DIAGNOSIS — Z930 Tracheostomy status: Secondary | ICD-10-CM

## 2023-09-13 DIAGNOSIS — E43 Unspecified severe protein-calorie malnutrition: Secondary | ICD-10-CM

## 2023-09-13 DIAGNOSIS — G929 Unspecified toxic encephalopathy: Secondary | ICD-10-CM

## 2023-09-13 DIAGNOSIS — J690 Pneumonitis due to inhalation of food and vomit: Secondary | ICD-10-CM | POA: Diagnosis not present

## 2023-09-13 DIAGNOSIS — F10920 Alcohol use, unspecified with intoxication, uncomplicated: Secondary | ICD-10-CM | POA: Diagnosis not present

## 2023-09-13 LAB — GLUCOSE, CAPILLARY
Glucose-Capillary: 129 mg/dL — ABNORMAL HIGH (ref 70–99)
Glucose-Capillary: 139 mg/dL — ABNORMAL HIGH (ref 70–99)
Glucose-Capillary: 173 mg/dL — ABNORMAL HIGH (ref 70–99)
Glucose-Capillary: 91 mg/dL (ref 70–99)
Glucose-Capillary: 99 mg/dL (ref 70–99)

## 2023-09-13 MED ORDER — INSULIN ASPART 100 UNIT/ML IJ SOLN
0.0000 [IU] | Freq: Four times a day (QID) | INTRAMUSCULAR | Status: DC
  Administered 2023-09-13: 3 [IU] via SUBCUTANEOUS
  Administered 2023-09-13: 4 [IU] via SUBCUTANEOUS
  Administered 2023-09-14: 3 [IU] via SUBCUTANEOUS
  Filled 2023-09-13 (×3): qty 1

## 2023-09-13 MED ORDER — NICOTINE 14 MG/24HR TD PT24
14.0000 mg | MEDICATED_PATCH | Freq: Every day | TRANSDERMAL | Status: DC
  Administered 2023-09-13 – 2023-09-14 (×2): 14 mg via TRANSDERMAL
  Filled 2023-09-13 (×3): qty 1

## 2023-09-13 MED ORDER — OSMOLITE 1.5 CAL PO LIQD
720.0000 mL | ORAL | Status: DC
  Administered 2023-09-13: 720 mL

## 2023-09-13 MED ORDER — STERILE WATER FOR INJECTION IJ SOLN
INTRAMUSCULAR | Status: AC
  Administered 2023-09-13: 10 mL
  Filled 2023-09-13: qty 10
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
## 2023-09-13 NOTE — Progress Notes (Addendum)
Nutrition Follow-up  DOCUMENTATION CODES:   Severe malnutrition in context of acute illness/injury  INTERVENTION:   -D/c banatrol -Continue 1 packet Juven BID via, each packet provides 95 calories, 2.5 grams of protein (collagen), and 9.8 grams of carbohydrate (3 grams sugar); also contains 7 grams of L-arginine and L-glutamine, 300 mg vitamin C, 15 mg vitamin E, 1.2 mcg vitamin B-12, 9.5 mg zinc, 200 mg calcium, and 1.5 g  Calcium Beta-hydroxy-Beta-methylbutyrate to support wound healing  -Continue nocturnal TF via g-tube:  -TF via g-tube:   Osmolite 1.5 @ 60 ml/hr   60 ml Prosource TF daily  30 ml free water flush every 4 hours   Tube feeding regimen provides 1160 kcal (100% of needs), 65 grams of protein, and 549 ml of H2O.  Total free wtaer: 729 ml daily -Continue dysphagia 3 diet -48 hour calorie count  NUTRITION DIAGNOSIS:   Severe Malnutrition related to acute illness as evidenced by moderate fat depletion, severe fat depletion, moderate muscle depletion, severe muscle depletion.  Ongoing  GOAL:   Patient will meet greater than or equal to 90% of their needs  Progressing   MONITOR:   TF tolerance  REASON FOR ASSESSMENT:   Ventilator    ASSESSMENT:   53 y/o female with h/o DM, bipolar disorder, substance abuse, anxiety, depression, COPD, OSA and homelessness who is admitted with AKI, overdose and aspiration PNA.  2/7- s/p trach 2/10- s/p g-tube 2/19- s/p BSE- dysphagia 3 diet  Reviewed I/O's: +410 ml x 24 hours and -11/6 L since 08/30/23  UOP: 250 ml x 24 hours  Drain output: 600 ml x 24 hours  Spoke with pt at bedside, who was much more alert and conversant in comparison to prior visit. Pt reports she is very excited to eat breakfast. She also shares that she wants her PEG tube removed and her TF discontinued. She expressed frustration regarding current state and wants "to able to do things on my own; I hate being told what to do".   RD actively  listened to pt concerns and affirmed pt's motivation, value of independence, and desire to progress medically. RD reviewed nutrition plan of care and discussed importance of adequate nutrition to help pt meet her goals. Pt reports "believe me, I will eat". RD again affirmed pt's desire to improve, however, discussed importance of pt beng able to demonstrate adequate oral intake consistently and consequences of stopping TF prematurely. RD reviewed options on how to best manage care moving forward; pt amenable to nocturnal TF and calorie count to provide more specific data to help guide decision to manage overall nutrition plan. Pt very appreciative of visit.   Noted pt consumed 50% of breakfast after RD visit.   Case discussed with RN, MD, and SLP; plan for transition to nocturnal feeds and calorie count.   Per TOC notes, plan for discharge home with home health services.   Medications reviewed and include lovenox, folic acid, neurontin, protonix, and thiamine.  Labs reviewed: CBGS: 91-149 (inpatient orders for glycemic control are 0-20 units insulin aspart every 6 hours and 10 units insulin glargine-yfgn daily).    Diet Order:   Diet Order             DIET DYS 3 Room service appropriate? Yes with Assist; Fluid consistency: Thin  Diet effective now                   EDUCATION NEEDS:   No education needs have been identified at this  time  Skin:  Skin Assessment: Reviewed RN Assessment (Stage II buttocks, laceration L knee) Skin Integrity Issues:: Stage I, Incisions Stage I: coccyx Incisions: closed neck s/p trach  Last BM:  09/08/23 (300 ml via rectal tube)  Height:   Ht Readings from Last 1 Encounters:  08/17/23 5\' 6"  (1.676 m)    Weight:   Wt Readings from Last 1 Encounters:  09/13/23 83.2 kg    Ideal Body Weight:  61.4 kg  BMI:  Body mass index is 29.6 kg/m.  Estimated Nutritional Needs:   Kcal:  1900-2200kcal/day  Protein:  95-110g/day  Fluid:   1.8-2.1L/day    Levada Schilling, RD, LDN, CDCES Registered Dietitian III Certified Diabetes Care and Education Specialist If unable to reach this RD, please use "RD Inpatient" group chat on secure chat between hours of 8am-4 pm daily

## 2023-09-13 NOTE — Plan of Care (Signed)
  Problem: Metabolic: Goal: Ability to maintain appropriate glucose levels will improve Outcome: Progressing   Problem: Clinical Measurements: Goal: Respiratory complications will improve Outcome: Progressing   Problem: Activity: Goal: Risk for activity intolerance will decrease Outcome: Progressing
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
## 2023-09-13 NOTE — Plan of Care (Signed)
  Problem: Education: Goal: Ability to describe self-care measures that may prevent or decrease complications (Diabetes Survival Skills Education) will improve Outcome: Progressing   Problem: Coping: Goal: Ability to adjust to condition or change in health will improve Outcome: Progressing   Problem: Fluid Volume: Goal: Ability to maintain a balanced intake and output will improve Outcome: Progressing   Problem: Health Behavior/Discharge Planning: Goal: Ability to identify and utilize available resources and services will improve Outcome: Progressing Goal: Ability to manage health-related needs will improve Outcome: Progressing   Problem: Metabolic: Goal: Ability to maintain appropriate glucose levels will improve Outcome: Progressing   Problem: Nutritional: Goal: Maintenance of adequate nutrition will improve Outcome: Progressing Goal: Progress toward achieving an optimal weight will improve Outcome: Progressing   Problem: Skin Integrity: Goal: Risk for impaired skin integrity will decrease Outcome: Progressing   Problem: Tissue Perfusion: Goal: Adequacy of tissue perfusion will improve Outcome: Progressing   Problem: Education: Goal: Knowledge of General Education information will improve Description: Including pain rating scale, medication(s)/side effects and non-pharmacologic comfort measures Outcome: Progressing   Problem: Health Behavior/Discharge Planning: Goal: Ability to manage health-related needs will improve Outcome: Progressing   Problem: Clinical Measurements: Goal: Ability to maintain clinical measurements within normal limits will improve Outcome: Progressing Goal: Will remain free from infection Outcome: Progressing Goal: Diagnostic test results will improve Outcome: Progressing Goal: Respiratory complications will improve Outcome: Progressing Goal: Cardiovascular complication will be avoided Outcome: Progressing   Problem: Activity: Goal:  Risk for activity intolerance will decrease Outcome: Progressing   Problem: Nutrition: Goal: Adequate nutrition will be maintained Outcome: Progressing   Problem: Coping: Goal: Level of anxiety will decrease Outcome: Progressing   Problem: Elimination: Goal: Will not experience complications related to bowel motility Outcome: Progressing Goal: Will not experience complications related to urinary retention Outcome: Progressing   Problem: Pain Managment: Goal: General experience of comfort will improve and/or be controlled Outcome: Progressing   Problem: Safety: Goal: Ability to remain free from injury will improve Outcome: Progressing   Problem: Skin Integrity: Goal: Risk for impaired skin integrity will decrease Outcome: Progressing   Problem: Education: Goal: Knowledge of disease or condition will improve Outcome: Progressing Goal: Knowledge of the prescribed therapeutic regimen will improve Outcome: Progressing   Problem: Activity: Goal: Ability to tolerate increased activity will improve Outcome: Progressing Goal: Will verbalize the importance of balancing activity with adequate rest periods Outcome: Progressing   Problem: Respiratory: Goal: Ability to maintain a clear airway will improve Outcome: Progressing Goal: Levels of oxygenation will improve Outcome: Progressing Goal: Ability to maintain adequate ventilation will improve Outcome: Progressing   Problem: Activity: Goal: Ability to tolerate increased activity will improve Outcome: Progressing   Problem: Respiratory: Goal: Ability to maintain a clear airway and adequate ventilation will improve Outcome: Progressing   Problem: Role Relationship: Goal: Method of communication will improve Outcome: Progressing

## 2023-09-13 NOTE — Progress Notes (Signed)
0730 patient alert x4 on trach collar with PMV able to make all needs known  0900 foley removed per orders patient now eating diet took\ meds PO whole
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
## 2023-09-14 DIAGNOSIS — J690 Pneumonitis due to inhalation of food and vomit: Secondary | ICD-10-CM | POA: Diagnosis not present

## 2023-09-14 DIAGNOSIS — F10920 Alcohol use, unspecified with intoxication, uncomplicated: Secondary | ICD-10-CM | POA: Diagnosis not present

## 2023-09-14 DIAGNOSIS — J9601 Acute respiratory failure with hypoxia: Secondary | ICD-10-CM | POA: Diagnosis not present

## 2023-09-14 DIAGNOSIS — T50904A Poisoning by unspecified drugs, medicaments and biological substances, undetermined, initial encounter: Secondary | ICD-10-CM | POA: Diagnosis not present

## 2023-09-14 LAB — CBC
HCT: 34.2 % — ABNORMAL LOW (ref 36.0–46.0)
Hemoglobin: 10.9 g/dL — ABNORMAL LOW (ref 12.0–15.0)
MCH: 29 pg (ref 26.0–34.0)
MCHC: 31.9 g/dL (ref 30.0–36.0)
MCV: 91 fL (ref 80.0–100.0)
Platelets: 356 10*3/uL (ref 150–400)
RBC: 3.76 MIL/uL — ABNORMAL LOW (ref 3.87–5.11)
RDW: 13.1 % (ref 11.5–15.5)
WBC: 7.9 10*3/uL (ref 4.0–10.5)
nRBC: 0 % (ref 0.0–0.2)

## 2023-09-14 LAB — HEMOGLOBIN A1C
Hgb A1c MFr Bld: 6.8 % — ABNORMAL HIGH (ref 4.8–5.6)
Mean Plasma Glucose: 148.46 mg/dL

## 2023-09-14 LAB — GLUCOSE, CAPILLARY
Glucose-Capillary: 97 mg/dL (ref 70–99)
Glucose-Capillary: 140 mg/dL — ABNORMAL HIGH (ref 70–99)
Glucose-Capillary: 124 mg/dL — ABNORMAL HIGH (ref 70–99)
Glucose-Capillary: 132 mg/dL — ABNORMAL HIGH (ref 70–99)
Glucose-Capillary: 193 mg/dL — ABNORMAL HIGH (ref 70–99)

## 2023-09-14 MED ORDER — ADULT MULTIVITAMIN W/MINERALS CH
1.0000 | ORAL_TABLET | Freq: Every day | ORAL | Status: DC
  Administered 2023-09-14 – 2023-09-15 (×2): 1 via ORAL
  Filled 2023-09-14 (×2): qty 1

## 2023-09-14 MED ORDER — VITAMIN C 500 MG PO TABS
500.0000 mg | ORAL_TABLET | Freq: Two times a day (BID) | ORAL | Status: DC
  Administered 2023-09-14 – 2023-09-15 (×3): 500 mg via ORAL
  Filled 2023-09-14 (×3): qty 1

## 2023-09-14 MED ORDER — STERILE WATER FOR INJECTION IJ SOLN
INTRAMUSCULAR | Status: AC
  Administered 2023-09-14: 10 mL
  Filled 2023-09-14: qty 10

## 2023-09-14 MED ORDER — INSULIN ASPART 100 UNIT/ML IJ SOLN
0.0000 [IU] | Freq: Three times a day (TID) | INTRAMUSCULAR | Status: DC
  Administered 2023-09-14: 3 [IU] via SUBCUTANEOUS
  Administered 2023-09-14 – 2023-09-15 (×3): 4 [IU] via SUBCUTANEOUS
  Filled 2023-09-14 (×4): qty 1

## 2023-09-14 MED ORDER — OXYCODONE HCL 5 MG PO TABS: 15.0000 mg | ORAL_TABLET | Freq: Three times a day (TID) | ORAL | Status: DC | PRN

## 2023-09-14 MED ORDER — ZINC SULFATE 220 (50 ZN) MG PO CAPS
220.0000 mg | ORAL_CAPSULE | Freq: Every day | ORAL | Status: DC
  Administered 2023-09-14 – 2023-09-15 (×2): 220 mg via ORAL
  Filled 2023-09-14 (×2): qty 1

## 2023-09-14 MED ORDER — GABAPENTIN 400 MG PO CAPS
800.0000 mg | ORAL_CAPSULE | Freq: Three times a day (TID) | ORAL | Status: DC
  Administered 2023-09-14 – 2023-09-15 (×4): 800 mg via ORAL
  Filled 2023-09-14 (×4): qty 2

## 2023-09-14 MED ORDER — ENSURE ENLIVE PO LIQD
237.0000 mL | Freq: Two times a day (BID) | ORAL | Status: DC
  Administered 2023-09-15 (×2): 237 mL via ORAL
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
## 2023-09-14 NOTE — Plan of Care (Signed)
  Problem: Coping: Goal: Ability to adjust to condition or change in health will improve Outcome: Progressing   Problem: Fluid Volume: Goal: Ability to maintain a balanced intake and output will improve Outcome: Progressing   Problem: Tissue Perfusion: Goal: Adequacy of tissue perfusion will improve Outcome: Progressing   Problem: Clinical Measurements: Goal: Will remain free from infection Outcome: Progressing   Problem: Nutrition: Goal: Adequate nutrition will be maintained Outcome: Progressing
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
## 2023-09-14 NOTE — Progress Notes (Signed)
0730 patient alert x4 sitting at bedside disconnected self from TF and TC education on TC and 02 needs at this time
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
## 2023-09-14 NOTE — Plan of Care (Signed)
  Problem: Education: Goal: Ability to describe self-care measures that may prevent or decrease complications (Diabetes Survival Skills Education) will improve Outcome: Progressing   Problem: Coping: Goal: Ability to adjust to condition or change in health will improve Outcome: Progressing   Problem: Fluid Volume: Goal: Ability to maintain a balanced intake and output will improve Outcome: Progressing   Problem: Health Behavior/Discharge Planning: Goal: Ability to identify and utilize available resources and services will improve Outcome: Progressing Goal: Ability to manage health-related needs will improve Outcome: Progressing   Problem: Metabolic: Goal: Ability to maintain appropriate glucose levels will improve Outcome: Progressing   Problem: Nutritional: Goal: Maintenance of adequate nutrition will improve Outcome: Progressing Goal: Progress toward achieving an optimal weight will improve Outcome: Progressing   Problem: Skin Integrity: Goal: Risk for impaired skin integrity will decrease Outcome: Progressing   Problem: Tissue Perfusion: Goal: Adequacy of tissue perfusion will improve Outcome: Progressing   Problem: Education: Goal: Knowledge of General Education information will improve Description: Including pain rating scale, medication(s)/side effects and non-pharmacologic comfort measures Outcome: Progressing   Problem: Health Behavior/Discharge Planning: Goal: Ability to manage health-related needs will improve Outcome: Progressing   Problem: Clinical Measurements: Goal: Ability to maintain clinical measurements within normal limits will improve Outcome: Progressing Goal: Will remain free from infection Outcome: Progressing Goal: Diagnostic test results will improve Outcome: Progressing Goal: Respiratory complications will improve Outcome: Progressing Goal: Cardiovascular complication will be avoided Outcome: Progressing   Problem: Activity: Goal:  Risk for activity intolerance will decrease Outcome: Progressing   Problem: Nutrition: Goal: Adequate nutrition will be maintained Outcome: Progressing   Problem: Coping: Goal: Level of anxiety will decrease Outcome: Progressing   Problem: Elimination: Goal: Will not experience complications related to bowel motility Outcome: Progressing Goal: Will not experience complications related to urinary retention Outcome: Progressing   Problem: Pain Managment: Goal: General experience of comfort will improve and/or be controlled Outcome: Progressing   Problem: Safety: Goal: Ability to remain free from injury will improve Outcome: Progressing   Problem: Skin Integrity: Goal: Risk for impaired skin integrity will decrease Outcome: Progressing   Problem: Education: Goal: Knowledge of disease or condition will improve Outcome: Progressing Goal: Knowledge of the prescribed therapeutic regimen will improve Outcome: Progressing   Problem: Activity: Goal: Ability to tolerate increased activity will improve Outcome: Progressing Goal: Will verbalize the importance of balancing activity with adequate rest periods Outcome: Progressing   Problem: Respiratory: Goal: Ability to maintain a clear airway will improve Outcome: Progressing Goal: Levels of oxygenation will improve Outcome: Progressing Goal: Ability to maintain adequate ventilation will improve Outcome: Progressing   Problem: Activity: Goal: Ability to tolerate increased activity will improve Outcome: Progressing   Problem: Respiratory: Goal: Ability to maintain a clear airway and adequate ventilation will improve Outcome: Progressing   Problem: Role Relationship: Goal: Method of communication will improve Outcome: Progressing

## 2023-09-14 NOTE — TOC Progression Note (Signed)
Transition of Care [facility]) - Progression Note    Patient Details  Name: Andrea Sanford MRN: [id number] Date of Birth: 1971/06/06  Transition of Care [facility]) CM/SW Contact  Truddie Hidden, RN Phone Number: 09/14/2023, 9:51 AM  Clinical Narrative:    Referral to [facility] and [facility] declined due to payor and new trach.          Expected Discharge Plan and Services                                               Social Determinants of Health (SDOH) Interventions SDOH Screenings   Food Insecurity: Patient Unable To Answer (08/17/2023)  Housing: Patient Unable To Answer (08/17/2023)  Transportation Needs: Patient Unable To Answer (08/17/2023)  Utilities: Patient Unable To Answer (08/17/2023)  Alcohol Screen: Low Risk  (06/13/2023)  Depression (PHQ2-9): High Risk (06/13/2023)  Financial Resource Strain: High Risk (08/29/2017)  Physical Activity: Inactive (08/29/2017)  Social Connections: Moderately Isolated (08/29/2017)  Stress: Stress Concern Present (08/29/2017)  Tobacco Use: High Risk (08/18/2023)    Readmission Risk Interventions     No data to display

## 2023-09-14 NOTE — Progress Notes (Signed)
Nutrition Follow-up  DOCUMENTATION CODES:   Severe malnutrition in context of acute illness/injury  INTERVENTION:   -D/c calorie count -Continue dysphagia 3 diet -D/c Juven -MVI with minerals daily -500 mg vitamin C BID -220 mg zinc sulfate daily x 14 days -Ensure Enlive po BID, each supplement provides 350 kcal and 20 grams of protein -D/c TF  NUTRITION DIAGNOSIS:   Severe Malnutrition related to acute illness as evidenced by moderate fat depletion, severe fat depletion, moderate muscle depletion, severe muscle depletion.  Ongoing  GOAL:   Patient will meet greater than or equal to 90% of their needs  Progressing  MONITOR:   TF tolerance  REASON FOR ASSESSMENT:   Ventilator    ASSESSMENT:   53 y/o female with h/o DM, bipolar disorder, substance abuse, anxiety, depression, COPD, OSA and homelessness who is admitted with AKI, overdose and aspiration PNA.  27- s/p trach 2/10- s/p g-tube 2/19- s/p BSE- dysphagia 3 diet  Reviewed I/O's: +1.3 L x 24 hours and -10.8 L since 08/31/23  UOP: 250 ml x 24 hours   Calorie count completed. Pt is very eager to d/c TF, noted pt disconnected TF herself this AM per RN. Pt meeting 85% of estimated kcal needs per PO intake per calorie count.   Case discussed with MD and RN. RD to d/c TF at this time.  2/19 Breakfast: 392 kcals, 14 grams protein Lunch: 519 kcals, 23 grams protein Dinner: 682 kcals, 30 grams protein  Total intake: 1593 kcal (85% of minimum estimated needs)  67 grams protein (71% of minimum estimated needs)  Reviewed wt hx; pt has experienced a 7.1% wt loss over the past week. Noted pt -10.8 L since 08/31/23, which is likely accounting for wt loss. RD will continue to follow wt trends.   Per TOC notes, plan for discharge home with home health services.   Medications reviewed and include lovenox, folic acid, neurontin, protonix, miralax, and thiamine.   Labs reviewed: CBGS: 124-140 (inpatient orders for  glycemic control are 0-20 units insulin aspart TID with meals and 10 units insulin glargine-yfgm daily).    Diet Order:   Diet Order             DIET DYS 3 Room service appropriate? Yes with Assist; Fluid consistency: Thin  Diet effective now                   EDUCATION NEEDS:   No education needs have been identified at this time  Skin:  Skin Assessment: Reviewed RN Assessment (Stage II buttocks, laceration L knee) Skin Integrity Issues:: Stage II Stage I: coccyx Stage II: coccyx Incisions: closed neck s/p trach  Last BM:  09/12/23 (type 6)  Height:   Ht Readings from Last 1 Encounters:  08/17/23 5\' 6"  (1.676 m)    Weight:   Wt Readings from Last 1 Encounters:  09/14/23 86.1 kg    Ideal Body Weight:  61.4 kg  BMI:  Body mass index is 30.64 kg/m.  Estimated Nutritional Needs:   Kcal:  1900-2200kcal/day  Protein:  95-110g/day  Fluid:  1.8-2.1L/day    Levada Schilling, RD, LDN, CDCES Registered Dietitian III Certified Diabetes Care and Education Specialist If unable to reach this RD, please use "RD Inpatient" group chat on secure chat between hours of 8am-4 pm daily

## 2023-09-15 DIAGNOSIS — F1490 Cocaine use, unspecified, uncomplicated: Secondary | ICD-10-CM | POA: Diagnosis not present

## 2023-09-15 DIAGNOSIS — F10920 Alcohol use, unspecified with intoxication, uncomplicated: Secondary | ICD-10-CM | POA: Diagnosis not present

## 2023-09-15 DIAGNOSIS — J9601 Acute respiratory failure with hypoxia: Secondary | ICD-10-CM | POA: Diagnosis not present

## 2023-09-15 DIAGNOSIS — T50904A Poisoning by unspecified drugs, medicaments and biological substances, undetermined, initial encounter: Secondary | ICD-10-CM | POA: Diagnosis not present

## 2023-09-15 DIAGNOSIS — Z931 Gastrostomy status: Secondary | ICD-10-CM

## 2023-09-15 LAB — GLUCOSE, CAPILLARY
Glucose-Capillary: 166 mg/dL — ABNORMAL HIGH (ref 70–99)
Glucose-Capillary: 151 mg/dL — ABNORMAL HIGH (ref 70–99)

## 2023-09-15 NOTE — TOC Progression Note (Signed)
Transition of Care [facility]) - Progression Note    Patient Details  Name: Andrea Sanford MRN: [id number] Date of Birth: February 13, 1971  Transition of Care [facility]) CM/SW Contact  Truddie Hidden, RN Phone Number: 09/15/2023, 3:52 PM  Clinical Narrative:    Spoke with patient at bedside regarding outpatient therapy. Patient declined outpatient therapy. Patient stated she will need transportation at discharge.         Expected Discharge Plan and Services                                               Social Determinants of Health (SDOH) Interventions SDOH Screenings   Food Insecurity: Patient Unable To Answer (08/17/2023)  Housing: Patient Unable To Answer (08/17/2023)  Transportation Needs: Patient Unable To Answer (08/17/2023)  Utilities: Patient Unable To Answer (08/17/2023)  Alcohol Screen: Low Risk  (06/13/2023)  Depression (PHQ2-9): High Risk (06/13/2023)  Financial Resource Strain: High Risk (08/29/2017)  Physical Activity: Inactive (08/29/2017)  Social Connections: Moderately Isolated (08/29/2017)  Stress: Stress Concern Present (08/29/2017)  Tobacco Use: High Risk (08/18/2023)    Readmission Risk Interventions     No data to display

## 2023-09-15 NOTE — Progress Notes (Signed)
Speech Language Pathology Treatment: Dysphagia  Patient Details Name: Andrea Sanford MRN: [id number] DOB: 07-11-1971 Today's Date: 09/15/2023 Time: 1191-4782 SLP Time Calculation (min) (ACUTE ONLY): 25 min  Assessment / Plan / Recommendation Clinical Impression  Pt was seen for ongoing assessment of swallowing and toleration of diet initiated yesterday. A regular/mech soft diet was initiated yesterday. Pt w/ tracheostomy-- Capped now no longer needing the PMV. She appears to comfortably tolerated the trach Capped for breathing and verbal communication, and for po intake.      Pt explained general aspiration precautions and agreed verbally to the need for following them especially sitting upright for all oral intake, No Talking w/ food and drink in mouth, and wearing trach Cap for ALL oral intake. Pt assisted w/ sitting up in bed for po trials- she requested coffee w/ cream/sugars. Pt appears to present w/ functional oropharyngeal phase swallowing w/ No oropharyngeal phase dysphagia noted w/ oral intake. Pt consumed po's w/ No overt, clinical s/s of aspiration during the trials.  Pt appears at reduced risk for aspiration when following general aspiration precautions and having trach Capped for oral intake. Pt does have challenging factors that could impact her oropharyngeal swallowing to include lengthy illness/hospitalization, tracheostomy, Pulmonary decline, deconditioning/weakness, and substance abuse. These factors can increase risk for dysphagia as well as decreased oral intake overall. During po trials, pt consumed po trials of thin liquids w/ no overt coughing, decline in vocal quality, or change in respiratory presentation during/post trials. Oral phase appeared [facility] w/ timely bolus management and control of bolus propulsion for A-P transfer for swallowing. Oral clearing achieved w/ trials. Pt fed self w/ setup support.    Recommend continue w/ a Mech soft/Regular diet w/ Meats Cut d/t  Edentulous status, moistened foods; thin liquids. TRACH CAPPED FOR ALL ORAL INTAKE. Aspiration precautions. Eat and drink slowly; small bites/sips; sit fully upright. Supervision by NSG staff  as needed. Pills Whole in puree for ease of swallowing as needed.   Discussed pt's presentation w/ NSG and MD-- no further skilled ST services indicated as pt appears at her swallowing baseline tolerating po consistencies adequately. Precautions posted at bedside, chart.    HPI HPI: Pt is a 53 y/o F admitted on 08/17/23 after being found unresponsive in a personal vehicle. Pt is being treated for acute hypoxic respiratory failure 2/2 suspected drug overdose of undetermined intent requiring emergent intubation & mechanical ventilatory support. Pt failed multiple trials of extubation, pt had tracheostomy placed 09/01/23. Pt had gastrostomy tube placed 09/04/23. PMH: cocaine abuse, anxiety, depression, asthma, bipolar disorder, CA, COPD & OSA, DM2, HTN, neuromuscular disorder.  Per request for this evaluation, ENT was consult for downsize of trach to a Shiley #6, uncuffed -- this was performed this morning w/ good success and tolerance.      SLP Plan  All goals met      Recommendations for follow up therapy are one component of a multi-disciplinary discharge planning process, led by the attending physician.  Recommendations may be updated based on patient status, additional functional criteria and insurance authorization.    Recommendations  Diet recommendations: Regular;Thin liquid Liquids provided via: Cup;Straw Medication Administration: Whole meds with puree (IF needed for ease of swallowing or if any coughing when swallowing w/ liquids) Supervision: Patient able to self feed Compensations: Minimize environmental distractions;Slow rate;Small sips/bites;Follow solids with liquid Postural Changes and/or Swallow Maneuvers: Out of bed for meals;Seated upright 90 degrees;Upright 30-60 min after meal      Patient  may  use Passy-Muir Speech Valve:  (trach capped now)           Oral care BID;Patient independent with oral care   Set up Supervision/Assistance Dysphagia, unspecified (R13.10) (tracheostomy)     All goals met      Jerilynn Som, MS, CCC-SLP Speech Language Pathologist Rehab Services; [facility] [phone] (ascom) Carlee Tesfaye  09/15/2023, 4:45 PM

## 2023-09-15 NOTE — Plan of Care (Signed)
  Problem: Education: Goal: Ability to describe self-care measures that may prevent or decrease complications (Diabetes Survival Skills Education) will improve Outcome: Progressing   Problem: Coping: Goal: Ability to adjust to condition or change in health will improve Outcome: Progressing   Problem: Fluid Volume: Goal: Ability to maintain a balanced intake and output will improve Outcome: Progressing   Problem: Health Behavior/Discharge Planning: Goal: Ability to identify and utilize available resources and services will improve Outcome: Progressing Goal: Ability to manage health-related needs will improve Outcome: Progressing   Problem: Metabolic: Goal: Ability to maintain appropriate glucose levels will improve Outcome: Progressing   Problem: Nutritional: Goal: Maintenance of adequate nutrition will improve Outcome: Progressing Goal: Progress toward achieving an optimal weight will improve Outcome: Progressing   Problem: Skin Integrity: Goal: Risk for impaired skin integrity will decrease Outcome: Progressing   Problem: Tissue Perfusion: Goal: Adequacy of tissue perfusion will improve Outcome: Progressing   Problem: Education: Goal: Knowledge of General Education information will improve Description: Including pain rating scale, medication(s)/side effects and non-pharmacologic comfort measures Outcome: Progressing   Problem: Health Behavior/Discharge Planning: Goal: Ability to manage health-related needs will improve Outcome: Progressing   Problem: Clinical Measurements: Goal: Ability to maintain clinical measurements within normal limits will improve Outcome: Progressing Goal: Will remain free from infection Outcome: Progressing Goal: Diagnostic test results will improve Outcome: Progressing Goal: Respiratory complications will improve Outcome: Progressing Goal: Cardiovascular complication will be avoided Outcome: Progressing   Problem: Activity: Goal:  Risk for activity intolerance will decrease Outcome: Progressing   Problem: Nutrition: Goal: Adequate nutrition will be maintained Outcome: Progressing   Problem: Coping: Goal: Level of anxiety will decrease Outcome: Progressing   Problem: Elimination: Goal: Will not experience complications related to bowel motility Outcome: Progressing Goal: Will not experience complications related to urinary retention Outcome: Progressing   Problem: Pain Managment: Goal: General experience of comfort will improve and/or be controlled Outcome: Progressing   Problem: Safety: Goal: Ability to remain free from injury will improve Outcome: Progressing   Problem: Skin Integrity: Goal: Risk for impaired skin integrity will decrease Outcome: Progressing   Problem: Education: Goal: Knowledge of disease or condition will improve Outcome: Progressing Goal: Knowledge of the prescribed therapeutic regimen will improve Outcome: Progressing   Problem: Activity: Goal: Ability to tolerate increased activity will improve Outcome: Progressing Goal: Will verbalize the importance of balancing activity with adequate rest periods Outcome: Progressing   Problem: Respiratory: Goal: Ability to maintain a clear airway will improve Outcome: Progressing Goal: Levels of oxygenation will improve Outcome: Progressing Goal: Ability to maintain adequate ventilation will improve Outcome: Progressing   Problem: Activity: Goal: Ability to tolerate increased activity will improve Outcome: Progressing   Problem: Respiratory: Goal: Ability to maintain a clear airway and adequate ventilation will improve Outcome: Progressing   Problem: Role Relationship: Goal: Method of communication will improve Outcome: Progressing

## 2023-09-15 NOTE — Progress Notes (Signed)
Patient left AMA, patient had spoken to Doctor earlier and at the time had agreed to stay but changed her mind, and could not be dissuaded. IV out but trach and peg tube in place.  Patient says she has a follow up with her primary. Encouraged her to go as soon as possible.

## 2023-09-15 NOTE — Discharge Summary (Signed)
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 ventriculomegaly, mass effect, evidence of mass lesion, intracranial hemorrhage or evidence of cortically based acute infarction. Occasional subcortical white matter hypodensity appears progressed since 2019, but overall gray-white differentiation is maintained throughout. Sulci are maintained. Basilar cisterns are normal. Vascular: No suspicious intracranial vascular hyperdensity. Faint Calcified atherosclerosis at the skull base. Skull: Stable visible osseous structures including chronic left maxilla and nasal bone fractures. Calvarium appears stable and intact. No acute osseous abnormality identified. Sinuses/Orbits: Chronic maxillary sinus mucosal thickening and/or retention cysts not significantly changed. Minimal other sinus mucosal thickening and/or fluid in the setting of intubation. Tympanic cavities and mastoids remain clear. Other: Intubated. Partially visible oral enteric tube. Small volume fluid in the pharynx. No acute orbit or scalp soft tissue finding. IMPRESSION: 1. No acute intracranial abnormality identified. Mild but progressed cerebral white matter changes since 2019, most commonly due to small vessel disease. 2. Intubated. 3. Chronic  left facial  fractures. Electronically Signed   By: Odessa Fleming M.D.   On: 08/17/2023 05:46   DG Chest Port 1 View Result Date: 08/17/2023 CLINICAL DATA:  Apnea.  Altered mental status. EXAM: PORTABLE CHEST 1 VIEW COMPARISON:  12/04/2011 FINDINGS: Endotracheal tube tip is approximately 2.6 cm above the base of the carina. The NG tube passes into the stomach although the distal tip position is not included on the film. The cardio pericardial silhouette is enlarged. Low volume film with vascular congestion. No focal airspace consolidation or substantial pleural effusion. Telemetry leads overlie the chest. Large gastric bubble noted despite the presence of an NG tube. IMPRESSION: 1. Low volume film with vascular congestion. 2. Large gastric bubble despite the presence of an NG tube. Electronically Signed   By: Kennith Center M.D.   On: 08/17/2023 05:36    Microbiology: Results for orders placed or performed during the hospital encounter of 08/17/23  Culture, blood (routine x 2)     Status: None   Collection Time: 08/17/23  4:52 AM   Specimen: BLOOD  Result Value Ref Range Status   Specimen Description BLOOD LEFT ANTECUBITAL  Final   Special Requests   Final    BOTTLES DRAWN AEROBIC AND ANAEROBIC Blood Culture results may not be optimal due to an excessive volume of blood received in culture bottles   Culture   Final    NO GROWTH 5 DAYS Performed at [facility], [address] [place], [place] [address]    Report Status 08/22/2023 FINAL  Final  Culture, blood (routine x 2)     Status: None   Collection Time: 08/17/23  4:52 AM   Specimen: BLOOD  Result Value Ref Range Status   Specimen Description BLOOD BLOOD LEFT HAND  Final   Special Requests   Final    BOTTLES DRAWN AEROBIC AND ANAEROBIC Blood Culture adequate volume   Culture   Final    NO GROWTH 5 DAYS Performed at [facility], [address] [place], [place] [address]    Report Status 08/22/2023 FINAL  Final  Resp panel by  RT-PCR (RSV, Flu A&B, Covid) Anterior Nasal Swab     Status: None   Collection Time: 08/17/23  5:11 AM   Specimen: Anterior Nasal Swab  Result Value Ref Range Status   SARS Coronavirus 2 by RT PCR NEGATIVE NEGATIVE Final    Comment: (NOTE) SARS-CoV-2 target nucleic acids are NOT DETECTED.  The SARS-CoV-2 RNA is generally detectable in upper respiratory specimens during the acute phase of infection. The lowest concentration of SARS-CoV-2 viral copies this assay can detect is 138 copies/mL. A negative result does not preclude SARS-Cov-2 infection and should not be used as the sole basis for treatment or other patient management decisions. A negative result may occur with  improper specimen collection/handling, submission of specimen other than nasopharyngeal swab, presence of viral mutation(s) within the areas targeted by this assay, and inadequate number of viral copies(<138 copies/mL). A negative result must be combined with clinical observations, patient history, and epidemiological information. The expected result is Negative.  Fact Sheet for Patients:  BloggerCourse.com  Fact Sheet for Healthcare Providers:  SeriousBroker.it  This test is no t yet approved or cleared by the [place] FDA and  has been authorized for detection and/or diagnosis of SARS-CoV-2 by FDA under an Emergency Use Authorization (EUA). This EUA will remain  in effect (meaning this test can be used) for the duration of the COVID-19 declaration  under Section 564(b)(1) of the Act, 21 U.S.C.section 360bbb-3(b)(1), unless the authorization is terminated  or revoked sooner.       Influenza A by PCR NEGATIVE NEGATIVE Final   Influenza B by PCR NEGATIVE NEGATIVE Final    Comment: (NOTE) The Xpert Xpress SARS-CoV-2/FLU/RSV plus assay is intended as an aid in the diagnosis of influenza from Nasopharyngeal swab specimens and should not be used as a sole basis  for treatment. Nasal washings and aspirates are unacceptable for Xpert Xpress SARS-CoV-2/FLU/RSV testing.  Fact Sheet for Patients: BloggerCourse.com  Fact Sheet for Healthcare Providers: SeriousBroker.it  This test is not yet approved or cleared by the [place] FDA and has been authorized for detection and/or diagnosis of SARS-CoV-2 by FDA under an Emergency Use Authorization (EUA). This EUA will remain in effect (meaning this test can be used) for the duration of the COVID-19 declaration under Section 564(b)(1) of the Act, 21 U.S.C. section 360bbb-3(b)(1), unless the authorization is terminated or revoked.     Resp Syncytial Virus by PCR NEGATIVE NEGATIVE Final    Comment: (NOTE) Fact Sheet for Patients: BloggerCourse.com  Fact Sheet for Healthcare Providers: SeriousBroker.it  This test is not yet approved or cleared by the [place] FDA and has been authorized for detection and/or diagnosis of SARS-CoV-2 by FDA under an Emergency Use Authorization (EUA). This EUA will remain in effect (meaning this test can be used) for the duration of the COVID-19 declaration under Section 564(b)(1) of the Act, 21 U.S.C. section 360bbb-3(b)(1), unless the authorization is terminated or revoked.  Performed at [facility], [address] [place], [place] [address]   MRSA Next Gen by PCR, Nasal     Status: None   Collection Time: 08/17/23  6:00 AM   Specimen: Nasal Mucosa; Nasal Swab  Result Value Ref Range Status   MRSA by PCR Next Gen NOT DETECTED NOT DETECTED Final    Comment: (NOTE) The GeneXpert MRSA Assay (FDA approved for NASAL specimens only), is one component of a comprehensive MRSA colonization surveillance program. It is not intended to diagnose MRSA infection nor to guide or monitor treatment for MRSA infections. Test performance is not FDA approved in  patients less than 20 years old. Performed at [facility], [address] [place], [place] [address]   Culture, blood (Routine X 2) w Reflex to ID Panel     Status: None   Collection Time: 08/19/23  4:23 PM   Specimen: BLOOD  Result Value Ref Range Status   Specimen Description BLOOD BLOOD RIGHT ARM AEROBIC BOTTLE ONLY  Final   Special Requests   Final    BOTTLES DRAWN AEROBIC ONLY Blood Culture results may not be optimal due to an inadequate volume of blood received in culture bottles   Culture   Final    NO GROWTH 5 DAYS Performed at [facility], [address] [place], [place] [address]    Report Status 08/24/2023 FINAL  Final  Culture, blood (Routine X 2) w Reflex to ID Panel     Status: None   Collection Time: 08/19/23  4:23 PM   Specimen: BLOOD  Result Value Ref Range Status   Specimen Description BLOOD BLOOD RIGHT HAND AEROBIC BOTTLE ONLY  Final   Special Requests   Final    BOTTLES DRAWN AEROBIC ONLY Blood Culture results may not be optimal due to an inadequate volume of blood received in culture bottles   Culture   Final    NO GROWTH  5 DAYS Performed at [facility], [address] [place], [place] [address]    Report Status 08/24/2023 FINAL  Final  Respiratory (~20 pathogens) panel by PCR     Status: None   Collection Time: 08/20/23 11:04 AM   Specimen: Nasopharyngeal Swab; Respiratory  Result Value Ref Range Status   Adenovirus NOT DETECTED NOT DETECTED Final   Coronavirus 229E NOT DETECTED NOT DETECTED Final    Comment: (NOTE) The Coronavirus on the Respiratory Panel, DOES NOT test for the novel  Coronavirus (2019 nCoV)    Coronavirus HKU1 NOT DETECTED NOT DETECTED Final   Coronavirus NL63 NOT DETECTED NOT DETECTED Final   Coronavirus OC43 NOT DETECTED NOT DETECTED Final   Metapneumovirus NOT DETECTED NOT DETECTED Final   Rhinovirus / Enterovirus NOT DETECTED NOT DETECTED Final   Influenza A NOT DETECTED NOT DETECTED Final    Influenza B NOT DETECTED NOT DETECTED Final   Parainfluenza Virus 1 NOT DETECTED NOT DETECTED Final   Parainfluenza Virus 2 NOT DETECTED NOT DETECTED Final   Parainfluenza Virus 3 NOT DETECTED NOT DETECTED Final   Parainfluenza Virus 4 NOT DETECTED NOT DETECTED Final   Respiratory Syncytial Virus NOT DETECTED NOT DETECTED Final   Bordetella pertussis NOT DETECTED NOT DETECTED Final   Bordetella Parapertussis NOT DETECTED NOT DETECTED Final   Chlamydophila pneumoniae NOT DETECTED NOT DETECTED Final   Mycoplasma pneumoniae NOT DETECTED NOT DETECTED Final    Comment: Performed at [facility], [address], [place], [place] [address]  Culture, Respiratory w Gram Stain     Status: None   Collection Time: 08/20/23  1:56 PM   Specimen: Tracheal Aspirate; Respiratory  Result Value Ref Range Status   Specimen Description   Final    TRACHEAL ASPIRATE Performed at [facility], [address] [place], [place] [address]    Special Requests   Final    NONE Performed at [facility], [address] [place], [place] [address]    Gram Stain   Final    FEW WBC PRESENT, PREDOMINANTLY PMN RARE GRAM POSITIVE COCCI Performed at [facility], [address] [place], [place] [address]    Culture FEW STAPHYLOCOCCUS AUREUS  Final   Report Status 08/24/2023 FINAL  Final   Organism ID, Bacteria STAPHYLOCOCCUS AUREUS  Final      Susceptibility   Staphylococcus aureus - MIC*    CIPROFLOXACIN <=0.5 SENSITIVE Sensitive     ERYTHROMYCIN >=8 RESISTANT Resistant     GENTAMICIN <=0.5 SENSITIVE Sensitive     OXACILLIN RESISTANT Resistant     TETRACYCLINE <=1 SENSITIVE Sensitive     VANCOMYCIN <=0.5 SENSITIVE Sensitive     TRIMETH/SULFA <=10 SENSITIVE Sensitive     CLINDAMYCIN <=0.25 SENSITIVE Sensitive     RIFAMPIN <=0.5 SENSITIVE Sensitive     Inducible Clindamycin NEGATIVE Sensitive     LINEZOLID 2 SENSITIVE Sensitive     * FEW STAPHYLOCOCCUS AUREUS  Culture,  BAL-quantitative w Gram Stain     Status: Abnormal   Collection Time: 08/21/23  1:05 PM   Specimen: Bronchoalveolar Lavage; Respiratory  Result Value Ref Range Status   Specimen Description   Final    BRONCHIAL ALVEOLAR LAVAGE Performed at [facility], [address] [place], [place] [address]    Special Requests   Final    NONE Performed at [facility], [address] [place], [place] [address]    Gram Stain   Final    ABUNDANT  WBC PRESENT, PREDOMINANTLY PMN NO ORGANISMS SEEN Performed at [facility], [address], [place], [place] [address]    Culture 10,000 COLONIES/mL STAPHYLOCOCCUS AUREUS (A)  Final   Report Status 08/24/2023 FINAL  Final   Organism ID, Bacteria STAPHYLOCOCCUS AUREUS (A)  Final      Susceptibility   Staphylococcus aureus - MIC*    CIPROFLOXACIN <=0.5 SENSITIVE Sensitive     ERYTHROMYCIN >=8 RESISTANT Resistant     GENTAMICIN <=0.5 SENSITIVE Sensitive     OXACILLIN 0.5 SENSITIVE Sensitive     TETRACYCLINE <=1 SENSITIVE Sensitive     VANCOMYCIN 1 SENSITIVE Sensitive     TRIMETH/SULFA <=10 SENSITIVE Sensitive     CLINDAMYCIN <=0.25 SENSITIVE Sensitive     RIFAMPIN <=0.5 SENSITIVE Sensitive     Inducible Clindamycin NEGATIVE Sensitive     LINEZOLID 2 SENSITIVE Sensitive     * 10,000 COLONIES/mL STAPHYLOCOCCUS AUREUS  Culture, blood (Routine X 2) w Reflex to ID Panel     Status: None   Collection Time: 09/05/23  9:46 AM   Specimen: BLOOD  Result Value Ref Range Status   Specimen Description BLOOD BLOOD LEFT HAND  Final   Special Requests NONE  Final   Culture   Final    NO GROWTH 5 DAYS Performed at [facility], [address], [place], [place] [address]    Report Status 09/10/2023 FINAL  Final  Culture, blood (Routine X 2) w Reflex to ID Panel     Status: None   Collection Time: 09/05/23 12:54 PM   Specimen: BLOOD  Result Value Ref Range Status   Specimen Description BLOOD BLOOD RIGHT HAND  Final   Special  Requests   Final    BOTTLES DRAWN AEROBIC AND ANAEROBIC Blood Culture adequate volume   Culture   Final    NO GROWTH 5 DAYS Performed at [facility], [address] [place], [place] [address]    Report Status 09/10/2023 FINAL  Final  Culture, Respiratory w Gram Stain     Status: None   Collection Time: 09/05/23  1:06 PM   Specimen: Tracheal Aspirate; Respiratory  Result Value Ref Range Status   Specimen Description   Final    TRACHEAL ASPIRATE Performed at [facility], [address] [place], [place] [address]    Special Requests   Final    NONE Performed at [facility], [address] [place], [place] [address]    Gram Stain   Final    NO WBC SEEN RARE GRAM POSITIVE RODS RARE GRAM NEGATIVE COCCI    Culture   Final    Normal respiratory flora-no Staph aureus or Pseudomonas seen Performed at [facility], [address], [place], [place] [address]    Report Status 09/07/2023 FINAL  Final    Labs: CBC: Recent Labs  Lab 09/09/23 0835 09/14/23 0618  WBC 7.6 7.9  HGB 10.0* 10.9*  HCT 31.0* 34.2*  MCV 90.6 91.0  PLT 412* 356   Basic Metabolic Panel: Recent Labs  Lab 09/09/23 0835  NA 140  K 3.9  CL 99  CO2 33*  GLUCOSE 106*  BUN 12  CREATININE 0.66  CALCIUM 9.4   Liver Function Tests: No results for input(s): "AST", "ALT", "ALKPHOS", "BILITOT", "PROT", "ALBUMIN" in the last 168 hours. CBG: Recent Labs  Lab 09/14/23 0810 09/14/23 1154 09/14/23 1705 09/15/23 0651 09/15/23 1211  GLUCAP 124* 132* 193* 166* 151*    Discharge time spent: 46  minutes.  Signed: Marcelino Duster, MD [facility] 09/15/2023

## 2023-09-15 NOTE — Progress Notes (Signed)
Occupational Therapy Treatment Patient Details Name: Andrea Sanford MRN: [id number] DOB: 1971-03-11 Today's Date: 09/15/2023   History of present illness Pt is a 53 y/o F admitted on 08/17/23 after being found unresponsive in a personal vehicle. Pt is being treated for acute hypoxic respiratory failure 2/2 suspected drug overdose of undetermined intent requiring emergent intubation & mechanical ventilatory support. Pt failed multiple trials of extubation, pt had tracheostomy placed 09/01/23. Pt had gastrostomy tube placed 09/04/23. PMH: cocaine abuse, anxiety, depression, asthma, bipolar disorder, CA, COPD & OSA, DM2, HTN, neuromuscular disorder   OT comments  Pt making progress towards goals, has been independent in room for ADLs. Pt completes bed mobility independently, dons socks/shoes with distant supervision from therapist, and completes 2 laps on unit while working on dual-tasking for IADL training (grocery and household management). OT discusses med mgmt strategies with pt, pt able to name at-home medications and state frequency/why she is taking med. Pt states she will have mother and other friends at home to stay with her 24/7 at discharge, and has assist for transportation. Discharge recommendations appropriate, OT to continue to follow.       If plan is discharge home, recommend the following:  A little help with walking and/or transfers;A little help with bathing/dressing/bathroom;Direct supervision/assist for medications management;Direct supervision/assist for financial management   Equipment Recommendations  None recommended by OT       Precautions / Restrictions Precautions Precautions: Fall Recall of Precautions/Restrictions: Impaired Precaution/Restrictions Comments: G-tube, trach Restrictions Weight Bearing Restrictions Per Provider Order: No       Mobility Bed Mobility Overal bed mobility: Independent Bed Mobility: Supine to Sit                 Transfers Overall transfer level: Independent Equipment used: None Transfers: Sit to/from Stand Sit to Stand: Modified independent (Device/Increase time)                 Balance Overall balance assessment: Needs assistance Sitting-balance support: Feet supported Sitting balance-Leahy Scale: Good     Standing balance support: During functional activity, No upper extremity supported Standing balance-Leahy Scale: Fair Standing balance comment: close supervision with slight sway/unsteadiness while walking                           ADL either performed or assessed with clinical judgement   ADL Overall ADL's : Modified independent                     Lower Body Dressing: Sit to/from stand;Supervision/safety Lower Body Dressing Details (indicate cue type and reason): dons shoes/socks from sit to stand               General ADL Comments: Pt has been independent in room for ADL performance (pt states she showered again with assistance). Pt completes dual-task activity in hallway, walking while naming grocery list items.     Communication Communication Communication: No apparent difficulties Factors Affecting Communication: Passey - Muir valve   Cognition Arousal: Alert Behavior During Therapy: Impulsive Cognition: No family/caregiver present to determine baseline, Cognition impaired         Attention impairment (select first level of impairment): Divided attention Executive functioning impairment (select all impairments): Problem solving, Reasoning OT - Cognition Comments: follows multi-step commands consistently, able to verbalize her home medications and tell this author the frequency and reason she was taking the meds  Following commands: Intact Following commands impaired: Follows multi-step commands with increased time      Cueing   Cueing Techniques: Verbal cues             Pertinent Vitals/ Pain       Pain  Assessment Pain Assessment: No/denies pain Faces Pain Scale: No hurt         Frequency  Min 1X/week        Progress Toward Goals  OT Goals(current goals can now be found in the care plan section)  Progress towards OT goals: Progressing toward goals  Acute Rehab OT Goals OT Goal Formulation: With patient Time For Goal Achievement: 09/19/23 Potential to Achieve Goals: Good ADL Goals Pt Will Perform Grooming: with modified independence;standing Pt Will Perform Lower Body Dressing: with modified independence;sit to/from stand Pt Will Transfer to Toilet: with modified independence;ambulating Pt Will Perform Toileting - Clothing Manipulation and hygiene: with modified independence;sit to/from stand  Plan      AM-PAC OT "6 Clicks" Daily Activity     Outcome Measure   Help from another person eating meals?: None Help from another person taking care of personal grooming?: None Help from another person toileting, which includes using toliet, bedpan, or urinal?: None Help from another person bathing (including washing, rinsing, drying)?: A Little Help from another person to put on and taking off regular upper body clothing?: None Help from another person to put on and taking off regular lower body clothing?: A Little 6 Click Score: 22    End of Session    OT Visit Diagnosis: Other abnormalities of gait and mobility (R26.89);Muscle weakness (generalized) (M62.81);Unsteadiness on feet (R26.81)   Activity Tolerance Patient tolerated treatment well   Patient Left     Nurse Communication Mobility status        Time: 9604-5409 OT Time Calculation (min): 12 min  Charges: OT General Charges $OT Visit: 1 Visit OT Treatments $Self Care/Home Management : 8-22 mins  Evone Arseneau L. Leshaun Biebel, OTR/L  09/15/23, 3:38 PM

## 2023-09-18 ENCOUNTER — Ambulatory Visit: Payer: Self-pay | Admitting: Family Medicine

## 2023-09-18 NOTE — Telephone Encounter (Signed)
 Tresa Endo from [employer] calls today on behalf of the pt. Tresa Endo states this pt left AMA with a trach and a PEG today around 0944 this morning. Tresa Endo is seeking help getting the patient back to the hospital. Tresa Endo tried reaching the patient at the numbers in pt's file but was unsuccessful. Tresa Endo states an agent she spoke to prior to speaking to this RN provided her with a different number that she will also try. Of note, it appears the pt actually signed out AMA from [facility] on 09/15/23.  This RN advised Tresa Endo that she would relay the concern to the office. RN called the CAL as well.   Kelly from [employer] would appreciate follow-up from the office if anyone has information about the pt. Tresa Endo with [employer] [phone] ext. [phone].  Copied from CRM [id number] [id number]. Topic: Clinical - Medical Advice >> Sep 18, 2023  2:37 PM Priscille Loveless wrote: Reason for CRM: [facility] with [facility] is asking to speak to a nurse. This pt was in the hosp but went AMA and needs to go back to the hosp. Reason for Disposition  General information question, no triage required and triager able to answer question  Answer Assessment - Initial Assessment Questions 1. REASON FOR CALL or QUESTION: "What is your reason for calling today?" or "How can I best help you?" or "What question do you have that I can help answer?"     Tresa Endo with [employer] calls today about this pt. Tresa Endo states this pt left AMA today with a trach and a PEG. Tresa Endo states the pt left around [phone] today. Tresa Endo has been unable to get in touch with the pt and is seeking help getting the pt back into the hospital.  Protocols used: Information Only Call - No Triage-A-AH

## 2023-09-22 ENCOUNTER — Encounter: Payer: Self-pay | Admitting: Family Medicine

## 2023-09-22 ENCOUNTER — Ambulatory Visit (INDEPENDENT_AMBULATORY_CARE_PROVIDER_SITE_OTHER): Payer: Medicaid Other | Admitting: Family Medicine

## 2023-09-22 VITALS — BP 133/118 | HR 87 | Ht 66.5 in | Wt 178.9 lb

## 2023-09-22 DIAGNOSIS — F10920 Alcohol use, unspecified with intoxication, uncomplicated: Secondary | ICD-10-CM

## 2023-09-22 DIAGNOSIS — T50904A Poisoning by unspecified drugs, medicaments and biological substances, undetermined, initial encounter: Secondary | ICD-10-CM

## 2023-09-22 DIAGNOSIS — Z931 Gastrostomy status: Secondary | ICD-10-CM

## 2023-09-22 DIAGNOSIS — J9601 Acute respiratory failure with hypoxia: Secondary | ICD-10-CM

## 2023-09-22 DIAGNOSIS — J690 Pneumonitis due to inhalation of food and vomit: Secondary | ICD-10-CM

## 2023-09-22 DIAGNOSIS — T50904D Poisoning by unspecified drugs, medicaments and biological substances, undetermined, subsequent encounter: Secondary | ICD-10-CM

## 2023-09-22 DIAGNOSIS — Z09 Encounter for follow-up examination after completed treatment for conditions other than malignant neoplasm: Secondary | ICD-10-CM

## 2023-09-22 DIAGNOSIS — I10 Essential (primary) hypertension: Secondary | ICD-10-CM

## 2023-09-22 DIAGNOSIS — F3131 Bipolar disorder, current episode depressed, mild: Secondary | ICD-10-CM

## 2023-09-22 DIAGNOSIS — Z930 Tracheostomy status: Secondary | ICD-10-CM

## 2023-09-22 DIAGNOSIS — F1410 Cocaine abuse, uncomplicated: Secondary | ICD-10-CM

## 2023-09-22 NOTE — Progress Notes (Addendum)
 Established patient visit   Patient: Andrea Sanford   DOB: April 11, 1971   [name]  MRN: [id number] Visit Date: 09/22/2023  Today's healthcare provider: Shirlee Latch, MD   Chief Complaint  Patient presents with   Hospitalization Follow-up    Patient was admitted to [facility] on 08/16/23 and she did not want to stay in hospital for safe discharge plan signed out [facility] 09/15/23. She agrees to follow up with PCP.Marland Kitchen She was treated for drug overdose. Patient reports being sick the first morning after but believes it was due to nicotine going bvack into system so quick but doing good with some pain. Pain located in amrs, neck, legs, LUQ and tailbone.    Subjective    HPI HPI     Hospitalization Follow-up    Additional comments: Patient was admitted to [facility] on 08/16/23 and she did not want to stay in hospital for safe discharge plan signed out [facility] 09/15/23. She agrees to follow up with PCP.Marland Kitchen She was treated for drug overdose. Patient reports being sick the first morning after but believes it was due to nicotine going bvack into system so quick but doing good with some pain. Pain located in amrs, neck, legs, LUQ and tailbone.       Last edited by Acey Lav, CMA on 09/22/2023 11:16 AM.       Discussed the use of AI scribe software for clinical note transcription with the patient, who gave verbal consent to proceed.  History of Present Illness   The patient, with a history of substance abuse, presents for a post-hospitalization follow-up. She was found unresponsive in her vehicle and taken to the ER where she was intubated and placed on a ventilator due to acute metabolic encephalopathy and hypoxic respiratory failure. The patient reports not remembering the hospitalization and expresses regret at being revived. She denies any suicidal intent. She currently has a tracheostomy tube in place and is eating independently, though she reports needing to soften her food significantly  before swallowing. She has choked once since the hospitalization. The patient also reports a desire for a DNR order to be put in place.         Medications: Outpatient Medications Prior to Visit  Medication Sig   clonazePAM (KLONOPIN) 1 MG tablet Take 1 mg by mouth 2 (two) times daily as needed for anxiety.   gabapentin (NEURONTIN) 800 MG tablet TAKE 1 TABLET BY MOUTH 3 TIMES DAILY AS NEEDED.   losartan (COZAAR) 25 MG tablet TAKE 1 TABLET (25 MG TOTAL) BY MOUTH DAILY.   naproxen (NAPROSYN) 500 MG tablet Take 1 tablet (500 mg total) by mouth 2 (two) times daily with a meal.   QUEtiapine (SEROQUEL) 400 MG tablet Take 400 mg by mouth at bedtime.   venlafaxine XR (EFFEXOR-XR) 150 MG 24 hr capsule Take 150 mg by mouth every morning.   VRAYLAR 3 MG capsule Take 3 mg by mouth daily.   No facility-administered medications prior to visit.    Review of Systems     Objective    BP (!) 133/118 (BP Location: Left Arm, Patient Position: Sitting, Cuff Size: Normal)   Pulse 87   Ht 5' 6.5" (1.689 m)   Wt 178 lb 14.4 oz (81.1 kg)   SpO2 100%   BMI 28.44 kg/m    Physical Exam  Was unable to complete PE as patient left before we finished the visit (See below)  No results found for any visits on  09/22/23.  Assessment & Plan     Problem List Items Addressed This Visit       Cardiovascular and Mediastinum   Primary hypertension     Respiratory   Acute respiratory failure (HCC)   Aspiration pneumonia (HCC)   Acute hypoxic respiratory failure (HCC)     Other   Bipolar disorder (HCC)   Cocaine abuse, episodic use (HCC)   Relevant Orders   Pain Mgt Scrn (14 Drugs), Ur   Drug overdose of undetermined intent   Relevant Orders   Pain Mgt Scrn (14 Drugs), Ur   Alcoholic intoxication without complication [facility])   Other Visit Diagnoses       Hospital discharge follow-up    -  Primary     Tracheostomy in place [facility])         PEG (percutaneous endoscopic gastrostomy) status (HCC)            Assessment and Plan    Acute Metabolic Encephalopathy Found unresponsive in her vehicle on August 17, 2023, with cocaine, marijuana, amphetamine, and alcohol in her system, leading to acute metabolic encephalopathy and hypoxic respiratory failure. Intubated and placed on a ventilator. Currently has a tracheostomy tube, eating independently with minor swallowing difficulties. Expressed frustration with hospital discharge process and lack of communication regarding tracheostomy tube removal. - Coordinate with the hospital for tracheostomy tube and PEG removal - patient leaves before we can assist with this after learning that we will not be removing it today - she did provide a urine sample, so we will send UDS - tried to address her depression screening and assess further about whether her overdose was intentional or not - she merely says that it was unintentional and she doesn't want to hurt herself.  - she does leave before we can finish the visit or I can do a physical exam - she states she left AMA from hospital because no one was going to tell her that she couldn't go outside - we will return the call to the RN that called about trying to get her back to the hospital  Hypoxic Respiratory Failure Secondary to substance use, necessitating intubation and mechanical ventilation. Discussed the importance of tracheostomy tube removal to prevent infection and the need for hospital readmission for safe removal. - as above          No follow-ups on file.      Total time spent on today's visit was greater than 40 minutes, including both face-to-face time and nonface-to-face time personally spent on review of chart (labs and imaging), discussing labs and goals, treatment options, answering patient's questions, and coordinating care.   Shirlee Latch, MD  [facility] [phone] (phone) [phone] (fax)  [facility]

## 2023-09-22 NOTE — Telephone Encounter (Signed)
 Pt in office today. Left before completing appointment. Provider advised patient to return to hospital. Pt left office without completing visit.   Contacted Kelly at [facility] to make her aware. She verbalized understanding and wanted to verify patients phone number to see if they could try reaching her again.

## 2023-09-23 ENCOUNTER — Other Ambulatory Visit: Payer: Self-pay | Admitting: Family Medicine

## 2023-09-23 DIAGNOSIS — E1140 Type 2 diabetes mellitus with diabetic neuropathy, unspecified: Secondary | ICD-10-CM

## 2023-09-23 LAB — PAIN MGT SCRN (14 DRUGS), UR
Amphetamine Scrn, Ur: NEGATIVE ng/mL
BARBITURATE SCREEN URINE: POSITIVE ng/mL — AB
BENZODIAZEPINE SCREEN, URINE: NEGATIVE ng/mL
Buprenorphine, Urine: NEGATIVE ng/mL
CANNABINOIDS UR QL SCN: NEGATIVE ng/mL
Cocaine (Metab) Scrn, Ur: POSITIVE ng/mL — AB
Creatinine(Crt), U: 66.8 mg/dL (ref 20.0–300.0)
Fentanyl, Urine: NEGATIVE pg/mL
Meperidine Screen, Urine: NEGATIVE ng/mL
Methadone Screen, Urine: NEGATIVE ng/mL
OXYCODONE+OXYMORPHONE UR QL SCN: NEGATIVE ng/mL
Opiate Scrn, Ur: NEGATIVE ng/mL
Ph of Urine: 5.5 (ref 4.5–8.9)
Phencyclidine Qn, Ur: NEGATIVE ng/mL
Propoxyphene Scrn, Ur: NEGATIVE ng/mL
Tramadol Screen, Urine: NEGATIVE ng/mL

## 2023-09-25 DIAGNOSIS — T50904D Poisoning by unspecified drugs, medicaments and biological substances, undetermined, subsequent encounter: Secondary | ICD-10-CM | POA: Diagnosis not present

## 2023-09-25 DIAGNOSIS — F319 Bipolar disorder, unspecified: Secondary | ICD-10-CM | POA: Diagnosis not present

## 2023-09-25 DIAGNOSIS — F419 Anxiety disorder, unspecified: Secondary | ICD-10-CM | POA: Diagnosis not present

## 2023-09-25 NOTE — Telephone Encounter (Signed)
 Requested Prescriptions  Pending Prescriptions Disp Refills   gabapentin (NEURONTIN) 800 MG tablet [Pharmacy Med Name: GABAPENTIN 800 MG TABLET] 270 tablet 0    Sig: TAKE 1 TABLET BY MOUTH 3 TIMES DAILY AS NEEDED.     Neurology: Anticonvulsants - gabapentin Passed - 09/25/2023  3:32 PM      Passed - Cr in normal range and within 360 days    Creatinine  Date Value Ref Range Status  10/15/2012 0.83 0.60 - 1.30 mg/dL Final   Creatinine, Ser  Date Value Ref Range Status  09/09/2023 0.66 0.44 - 1.00 mg/dL Final         Passed - Completed PHQ-2 or PHQ-9 in the last 360 days      Passed - Valid encounter within last 12 months    Recent Outpatient Visits           2 months ago Closed nondisplaced fracture of right clavicle with delayed healing, unspecified part of clavicle, subsequent encounter   [facility], Jennette Kettle, FNP   2 months ago No-show for appointment   [facility] Jacky Kindle, FNP   3 months ago Fall, initial encounter   [facility], Monico Blitz, DO

## 2023-09-26 ENCOUNTER — Other Ambulatory Visit: Payer: Self-pay

## 2023-09-26 ENCOUNTER — Emergency Department
Admission: EM | Admit: 2023-09-26 | Discharge: 2023-09-26 | Disposition: A | Discharge status: Home / Self Care | Attending: Emergency Medicine | Admitting: Emergency Medicine

## 2023-09-26 ENCOUNTER — Encounter: Payer: Self-pay | Admitting: Intensive Care

## 2023-09-26 DIAGNOSIS — R109 Unspecified abdominal pain: Secondary | ICD-10-CM | POA: Insufficient documentation

## 2023-09-26 DIAGNOSIS — Z431 Encounter for attention to gastrostomy: Secondary | ICD-10-CM | POA: Insufficient documentation

## 2023-09-26 DIAGNOSIS — E876 Hypokalemia: Secondary | ICD-10-CM | POA: Insufficient documentation

## 2023-09-26 DIAGNOSIS — Z79899 Other long term (current) drug therapy: Secondary | ICD-10-CM | POA: Diagnosis not present

## 2023-09-26 DIAGNOSIS — Z09 Encounter for follow-up examination after completed treatment for conditions other than malignant neoplasm: Secondary | ICD-10-CM

## 2023-09-26 DIAGNOSIS — Z931 Gastrostomy status: Secondary | ICD-10-CM

## 2023-09-26 HISTORY — DX: Other psychoactive substance abuse, uncomplicated: F19.10

## 2023-09-26 LAB — CBC WITH DIFFERENTIAL/PLATELET
Abs Immature Granulocytes: 0.02 10*3/uL (ref 0.00–0.07)
Basophils Absolute: 0.1 10*3/uL (ref 0.0–0.1)
Basophils Relative: 1 %
Eosinophils Absolute: 0.2 10*3/uL (ref 0.0–0.5)
Eosinophils Relative: 2 %
HCT: 38.9 % (ref 36.0–46.0)
Hemoglobin: 12.5 g/dL (ref 12.0–15.0)
Immature Granulocytes: 0 %
Lymphocytes Relative: 32 %
Lymphs Abs: 2.4 10*3/uL (ref 0.7–4.0)
MCH: 29.9 pg (ref 26.0–34.0)
MCHC: 32.1 g/dL (ref 30.0–36.0)
MCV: 93.1 fL (ref 80.0–100.0)
Monocytes Absolute: 0.7 10*3/uL (ref 0.1–1.0)
Monocytes Relative: 9 %
Neutro Abs: 4.2 10*3/uL (ref 1.7–7.7)
Neutrophils Relative %: 56 %
Platelets: 248 10*3/uL (ref 150–400)
RBC: 4.18 MIL/uL (ref 3.87–5.11)
RDW: 13.6 % (ref 11.5–15.5)
WBC: 7.6 10*3/uL (ref 4.0–10.5)
nRBC: 0 % (ref 0.0–0.2)

## 2023-09-26 LAB — COMPREHENSIVE METABOLIC PANEL
ALT: 11 U/L (ref 0–44)
AST: 14 U/L — ABNORMAL LOW (ref 15–41)
Albumin: 3.6 g/dL (ref 3.5–5.0)
Alkaline Phosphatase: 115 U/L (ref 38–126)
Anion gap: 9 (ref 5–15)
BUN: 6 mg/dL (ref 6–20)
CO2: 26 mmol/L (ref 22–32)
Calcium: 9.1 mg/dL (ref 8.9–10.3)
Chloride: 103 mmol/L (ref 98–111)
Creatinine, Ser: 0.63 mg/dL (ref 0.44–1.00)
GFR, Estimated: >60 mL/min (ref >60)
Glucose, Bld: 103 mg/dL — ABNORMAL HIGH (ref 70–99)
Potassium: 3 mmol/L — ABNORMAL LOW (ref 3.5–5.1)
Sodium: 138 mmol/L (ref 135–145)
Total Bilirubin: 0.5 mg/dL (ref 0.0–1.2)
Total Protein: 7.5 g/dL (ref 6.5–8.1)

## 2023-09-26 MED ORDER — POTASSIUM CHLORIDE 20 MEQ PO PACK: 20.0000 meq | PACK | Freq: Two times a day (BID) | ORAL | 0 refills | Status: DC

## 2023-09-26 NOTE — ED Triage Notes (Signed)
 Patient admitted to [facility] on 08/16/23 for drug overdose and had trach and feeding tube placed.   Patient left [facility] 09/15/23  Patient presents to ER c/o abdominal pain X4 days and wanting her feeding tube out.

## 2023-09-26 NOTE — ED Notes (Signed)
 See triage note  Presents with some abd pain  States pain started about 4 days ago  No fever

## 2023-09-26 NOTE — ED Provider Notes (Signed)
 Shared visit   Patient presented to the emergency department to have her tracheostomy and G-tube removed.  Discussed with the patient that we do not do that in the emergency department.  Discussed that we could call ear nose and throat and general surgery to try to help make an outpatient appointment for her to have her evaluated to see if she was able to have these removed.  Patient states she does not want to wait to put the numbers on her discharge paperwork and she will call herself.  Patient also has a primary care physician who she can follow-up with who can help with these appointments.  Given return precautions for any worsening symptoms.   Corena Herter, MD 09/26/23 [phone]

## 2023-09-26 NOTE — ED Provider Notes (Signed)
 [place] EMERGENCY DEPARTMENT AT [facility] Provider Note   CSN: [id number] Arrival date & time: 09/26/23  1302     History  Chief Complaint  Patient presents with   Abdominal Pain    Andrea Sanford is a 53 y.o. female presents to the emergency department for possible G-tube and tracheostomy removal.  She was admitted in January for altered mental status, underwent G-tube placement for malnutrition and had respiratory distress and possible aspiration pneumonia.  She left the hospital AMA before these devices could be removed.  She denies any pain, chest pain, shortness of breath, fevers.  She has not had any type of cough.  Overall she has been doing well.  She is looking to have her G-tube and tracheostomy removed.  She was seen by PCP and also left their office before follow-up could be given.    HPI     Home Medications Prior to Admission medications   Medication Sig Start Date End Date Taking? Authorizing Provider  potassium chloride (KLOR-CON) 20 MEQ packet Take 20 mEq by mouth 2 (two) times daily for 5 days. 09/26/23 10/01/23 Yes Evon Slack, PA-C  clonazePAM (KLONOPIN) 1 MG tablet Take 1 mg by mouth 2 (two) times daily as needed for anxiety.    [provider]  gabapentin (NEURONTIN) 800 MG tablet TAKE 1 TABLET BY MOUTH 3 TIMES DAILY AS NEEDED. 09/25/23   Pardue, Monico Blitz, DO  losartan (COZAAR) 25 MG tablet TAKE 1 TABLET (25 MG TOTAL) BY MOUTH DAILY. 08/21/23   Sherlyn Hay, DO  naproxen (NAPROSYN) 500 MG tablet Take 1 tablet (500 mg total) by mouth 2 (two) times daily with a meal. 07/24/23   Sallee Provencal, FNP  QUEtiapine (SEROQUEL) 400 MG tablet Take 400 mg by mouth at bedtime.    [provider]  venlafaxine XR (EFFEXOR-XR) 150 MG 24 hr capsule Take 150 mg by mouth every morning. 06/09/23   [provider]  VRAYLAR 3 MG capsule Take 3 mg by mouth daily. 11/28/20   [provider]      Allergies    Patient has no known  allergies.    Review of Systems   Review of Systems  Physical Exam Updated Vital Signs BP 127/87 (BP Location: Left Arm)   Pulse (!) 101   Temp 98 F (36.7 C) (Oral)   Resp 16   Ht 5' 6.5" (1.689 m)   Wt 81.1 kg   SpO2 100%   BMI 28.44 kg/m  Physical Exam Vitals reviewed.  Constitutional:      Appearance: She is well-developed.  HENT:     Head: Normocephalic and atraumatic.     Comments: Trachea in good position with no skin irritation, signs of cellulitis or drainage. Eyes:     Conjunctiva/sclera: Conjunctivae normal.  Cardiovascular:     Rate and Rhythm: Normal rate.  Pulmonary:     Effort: Pulmonary effort is normal. No respiratory distress.  Abdominal:     General: Bowel sounds are normal. There is no distension.     Tenderness: There is no abdominal tenderness. There is no right CVA tenderness, left CVA tenderness or guarding.     Comments: G-tube in good positioning with no tenderness to palpation along the abdomen.  Skin appears well with no signs of any infectious process.  Musculoskeletal:        General: Normal range of motion.     Cervical back: Normal range of motion.  Skin:  General: Skin is warm.     Findings: No rash.  Neurological:     Mental Status: She is alert and oriented to person, place, and time.  Psychiatric:        Behavior: Behavior normal.        Thought Content: Thought content normal.     ED Results / Procedures / Treatments   Labs (all labs ordered are listed, but only abnormal results are displayed) Labs Reviewed  COMPREHENSIVE METABOLIC PANEL - Abnormal; Notable for the following components:      Result Value   Potassium 3.0 (*)    Glucose, Bld 103 (*)    AST 14 (*)    All other components within normal limits  CBC WITH DIFFERENTIAL/PLATELET    EKG None  Radiology No results found.  Procedures Procedures    Medications Ordered in ED Medications - No data to display  ED Course/ Medical Decision Making/ A&P                                  Medical Decision Making Amount and/or Complexity of Data Reviewed Labs: ordered.  Risk Prescription drug management.   53 year old female left AMA from hospital admission.  She does not have proper follow-up with general surgery and ENT for G-tube and tracheal removal.  Discussed outpatient follow-up for these procedures.  Patient left hospital before discharge paperwork could be given to her.  Patient was found to have slightly low potassium of 3.0.  She was encouraged to take oral potassium supplement and high potassium diet.  Remainder of blood work appeared normal. Final Clinical Impression(s) / ED Diagnoses Final diagnoses:  Follow-up exam  Gastrointestinal tube present [facility])  Hypokalemia    Rx / DC Orders ED Discharge Orders          Ordered    potassium chloride (KLOR-CON) 20 MEQ packet  2 times daily        09/26/23 1656              Ronnette Juniper 09/26/23 1705    Corena Herter, MD 09/27/23 0009

## 2023-09-26 NOTE — Discharge Instructions (Addendum)
 Please call ENT and general surgery offices to schedule follow up appointment. Return to the ER for any fevers, pain worsening symptoms or any urgent changes in your health.  Please eat a potassium rich diet. Take potassium supplements as prescribed.

## 2023-09-29 ENCOUNTER — Encounter: Payer: Self-pay | Admitting: Family Medicine

## 2023-09-29 ENCOUNTER — Ambulatory Visit (INDEPENDENT_AMBULATORY_CARE_PROVIDER_SITE_OTHER): Admitting: Family Medicine

## 2023-09-29 VITALS — BP 135/109 | HR 111 | Ht 66.5 in | Wt 182.0 lb

## 2023-09-29 DIAGNOSIS — J019 Acute sinusitis, unspecified: Secondary | ICD-10-CM | POA: Diagnosis not present

## 2023-09-29 DIAGNOSIS — F1410 Cocaine abuse, uncomplicated: Secondary | ICD-10-CM

## 2023-09-29 DIAGNOSIS — L89152 Pressure ulcer of sacral region, stage 2: Secondary | ICD-10-CM | POA: Insufficient documentation

## 2023-09-29 DIAGNOSIS — Z930 Tracheostomy status: Secondary | ICD-10-CM | POA: Insufficient documentation

## 2023-09-29 DIAGNOSIS — B372 Candidiasis of skin and nail: Secondary | ICD-10-CM | POA: Diagnosis not present

## 2023-09-29 DIAGNOSIS — F314 Bipolar disorder, current episode depressed, severe, without psychotic features: Secondary | ICD-10-CM

## 2023-09-29 DIAGNOSIS — B9689 Other specified bacterial agents as the cause of diseases classified elsewhere: Secondary | ICD-10-CM | POA: Insufficient documentation

## 2023-09-29 DIAGNOSIS — I10 Essential (primary) hypertension: Secondary | ICD-10-CM

## 2023-09-29 DIAGNOSIS — F1910 Other psychoactive substance abuse, uncomplicated: Secondary | ICD-10-CM | POA: Insufficient documentation

## 2023-09-29 MED ORDER — DOXYCYCLINE HYCLATE 100 MG PO TABS: 100.0000 mg | ORAL_TABLET | Freq: Two times a day (BID) | ORAL | 0 refills | Status: DC

## 2023-09-29 MED ORDER — CA ALGINATE-CARBOXYMETHYLCELL EX PADS: 1.0000 | MEDICATED_PAD | Freq: Every day | CUTANEOUS | 1 refills | Status: DC

## 2023-09-29 MED ORDER — NYSTATIN 100000 UNIT/GM EX POWD: 1.0000 | Freq: Three times a day (TID) | CUTANEOUS | 0 refills | Status: DC

## 2023-09-29 NOTE — Assessment & Plan Note (Signed)
 She has substance use issues and expressed ambivalence about life, including desire for a non-resuscitation directive. Currently under the care of a psychiatrist, Dr. Janeece Riggers, with a follow-up appointment in six weeks. She has suicidal ideation but denies current intent. - Continue psychiatric follow-up with Dr. Janeece Riggers in [facility] - Encourage adherence to prescribed psychiatric medications

## 2023-09-29 NOTE — Assessment & Plan Note (Signed)
 The sore on the buttocks has been present since hospitalization. It is healing well but causes significant pain when sitting. There is no sign of infection, although there is some secretion. A referral to wound management was discussed but deemed unnecessary at this time as long as pressure is kept off the area and dressings are used. - Recommend using a donut cushion to relieve pressure when sitting - Apply dressings to the sore to keep it clean and dry - Ensure adequate nutrition to promote healing

## 2023-09-29 NOTE — Assessment & Plan Note (Signed)
 Noted. Counseled patient regarding reducing/stopping usage. She remains precontemplative at this time.

## 2023-09-29 NOTE — Assessment & Plan Note (Signed)
 She is on losartan for hypertension. Blood pressure remains elevated. Counseled on lifestyle modifications and medication adherence. - Continue losartan as prescribed - Monitor blood pressure regularly; recheck at follow up.

## 2023-09-29 NOTE — Assessment & Plan Note (Signed)
 The cough has been present since discharge from the hospital and worsens when lying down. She reports coughing up phlegm, sometimes with blackish material, and experiences ear pain and sore throat. Notes presence of sinus pressure; worse in frontal region, compared to maxillary.

## 2023-09-29 NOTE — Assessment & Plan Note (Signed)
 Tracheostomy present from recent hospitalization from which she left AMA. She is seeking removal of it.

## 2023-09-29 NOTE — Progress Notes (Signed)
 Established patient visit   Patient: Andrea Sanford   DOB: 1971/03/09   [name]  MRN: [id number] Visit Date: 09/29/2023  Today's healthcare provider: Sherlyn Hay, DO   Chief Complaint  Patient presents with   Medical Management of Chronic Issues   Hospitalization Follow-up   Subjective    HPI The patient presents with a sore on her bottom and a persistent cough.  She noticed a sore on her bottom upon waking up after a recent hospitalization. The sore is painful, especially when sitting, and has some secretion but does not appear infected. She has been keeping the area clean and describes the pain as severe.  She reports a persistent cough that worsens when lying down, producing phlegm that is sometimes yellowish or blackish. She also experiences ear pain and a sore throat, which she attributes to the coughing. The cough has been present since her hospital discharge and has worsened over the past week. No intentional overdose, but she has a history of suicidal thoughts. She experiences dizziness associated with ear pain and has been blowing her nose frequently.  She describes a recent hospitalization related to substance use, including klonopin, gabapentin, cocaine, and alcohol. She does not recall the events leading to her hospitalization but was informed by her boyfriend about her actions prior to being admitted. She mentions substance use, including alcohol, klonopin, gabapentin, cocaine, and marijuana.  She mentions experiencing heat rash between her legs, which she attributes to the warm environment at home. She describes the area as red and chapped at times. Her mother keeps the house warm, which may contribute to her skin issues.  She is currently taking gabapentin, losartan, venlafaxine, Seroquel, and Klonopin. She mentions not taking much naproxen and has only taken one dose of potassium prescribed in the ER. She has a follow-up appointment with her psychiatrist  in six weeks (Dr. Janeece Riggers in [place]; last saw him 2 days ago).     Medications: Outpatient Medications Prior to Visit  Medication Sig   Aspirin-Salicylamide-Caffeine (ARTHRITIS STRENGTH BC POWDER PO) Take by mouth. Takes 2 in the morning   clonazePAM (KLONOPIN) 1 MG tablet Take 1 mg by mouth 2 (two) times daily as needed for anxiety.   gabapentin (NEURONTIN) 800 MG tablet TAKE 1 TABLET BY MOUTH 3 TIMES DAILY AS NEEDED.   losartan (COZAAR) 25 MG tablet TAKE 1 TABLET (25 MG TOTAL) BY MOUTH DAILY.   naproxen (NAPROSYN) 500 MG tablet Take 1 tablet (500 mg total) by mouth 2 (two) times daily with a meal.   potassium chloride (KLOR-CON) 20 MEQ packet Take 20 mEq by mouth 2 (two) times daily for 5 days.   QUEtiapine (SEROQUEL) 400 MG tablet Take 400 mg by mouth at bedtime.   venlafaxine XR (EFFEXOR-XR) 150 MG 24 hr capsule Take 150 mg by mouth every morning.   [DISCONTINUED] VRAYLAR 3 MG capsule Take 3 mg by mouth daily.   No facility-administered medications prior to visit.        Objective    BP (!) 135/109 Comment: took medication about an hour ago  Pulse (!) 111   Ht 5' 6.5" (1.689 m)   Wt 182 lb (82.6 kg)   BMI 28.94 kg/m     Physical Exam Constitutional:      Appearance: Normal appearance.  HENT:     Head: Normocephalic and atraumatic.  Eyes:     General: No scleral icterus.    Extraocular Movements: Extraocular movements intact.  Conjunctiva/sclera: Conjunctivae normal.  Cardiovascular:     Rate and Rhythm: Normal rate and regular rhythm.     Pulses: Normal pulses.     Heart sounds: Normal heart sounds.  Pulmonary:     Effort: Pulmonary effort is normal. No respiratory distress.     Breath sounds: Normal breath sounds.     Comments: +trach present Abdominal:     General: Bowel sounds are normal. There is no distension.     Palpations: Abdomen is soft. There is no mass.     Tenderness: There is no abdominal tenderness. There is no guarding.     Comments: PEG  tube present  Musculoskeletal:     Right lower leg: No edema.     Left lower leg: No edema.  Skin:    General: Skin is warm and dry.       Neurological:     Mental Status: She is alert and oriented to person, place, and time. Mental status is at baseline.  Psychiatric:        Mood and Affect: Mood normal.        Behavior: Behavior normal.      No results found for any visits on 09/29/23.  Assessment & Plan    Pressure injury of coccygeal region, stage 2 (HCC) Assessment & Plan: The sore on the buttocks has been present since hospitalization. It is healing well but causes significant pain when sitting. There is no sign of infection, although there is some secretion. A referral to wound management was discussed but deemed unnecessary at this time as long as pressure is kept off the area and dressings are used. - Recommend using a donut cushion to relieve pressure when sitting - Apply dressings to the sore to keep it clean and dry - Ensure adequate nutrition to promote healing  Orders: -     Ca Alginate-Carboxymethylcell; Apply 1 each topically daily.  Dispense: 20 each; Refill: 1  Acute bacterial rhinosinusitis Assessment & Plan: The cough has been present since discharge from the hospital and worsens when lying down. She reports coughing up phlegm, sometimes with blackish material, and experiences ear pain and sore throat. Notes presence of sinus pressure; worse in frontal region, compared to maxillary.  Orders: -     Doxycycline Hyclate; Take 1 tablet (100 mg total) by mouth 2 (two) times daily.  Dispense: 10 tablet; Refill: 0  Candidal dermatitis -     Nystatin; Apply 1 Application topically 3 (three) times daily.  Dispense: 15 g; Refill: 0  Tracheostomy status (HCC) Assessment & Plan: Tracheostomy present from recent hospitalization from which she left AMA. She is seeking removal of it.  Orders: -     Ambulatory referral to ENT  Primary hypertension Assessment &  Plan: She is on losartan for hypertension. Blood pressure remains elevated. Counseled on lifestyle modifications and medication adherence. - Continue losartan as prescribed - Monitor blood pressure regularly; recheck at follow up.   Substance abuse [facility]) Assessment & Plan: Noted. Counseled patient regarding reducing/stopping usage. She remains precontemplative at this time.   Cocaine abuse, episodic use (HCC) Assessment & Plan: Noted. Counseled patient regarding reducing/stopping usage. She remains precontemplative at this time.    Bipolar disorder, current episode depressed, severe, without psychotic features [facility]) Assessment & Plan: She has substance use issues and expressed ambivalence about life, including desire for a non-resuscitation directive. Currently under the care of a psychiatrist, Dr. Janeece Riggers, with a follow-up appointment in six weeks. She has suicidal ideation but  denies current intent. - Continue psychiatric follow-up with Dr. Janeece Riggers in [facility] - Encourage adherence to prescribed psychiatric medications     Return if symptoms worsen or fail to improve prior to re-establishing with Dr. Girtha Rm.      I discussed the assessment and treatment plan with the patient  The patient was provided an opportunity to ask questions and all were answered. The patient agreed with the plan and demonstrated an understanding of the instructions.   The patient was advised to call back or seek an in-person evaluation if the symptoms worsen or if the condition fails to improve as anticipated.  Total time was 45 minutes. That includes chart review before the visit, the actual patient visit, and time spent on documentation after the visit.    Sherlyn Hay, DO  [facility] [phone] (phone) [phone] (fax)  [facility]

## 2023-10-02 ENCOUNTER — Ambulatory Visit (INDEPENDENT_AMBULATORY_CARE_PROVIDER_SITE_OTHER): Admitting: Surgery

## 2023-10-02 ENCOUNTER — Encounter: Payer: Self-pay | Admitting: Surgery

## 2023-10-02 VITALS — BP 161/125 | HR 98 | Temp 98.4°F | Ht 67.0 in | Wt 178.8 lb

## 2023-10-02 DIAGNOSIS — Z930 Tracheostomy status: Secondary | ICD-10-CM | POA: Diagnosis not present

## 2023-10-02 DIAGNOSIS — Z431 Encounter for attention to gastrostomy: Secondary | ICD-10-CM

## 2023-10-02 DIAGNOSIS — Z931 Gastrostomy status: Secondary | ICD-10-CM

## 2023-10-02 NOTE — Patient Instructions (Signed)
 We have scheduled you for a CT Scan of your Abdomen and Pelvis without contrast. This has been scheduled at [facility] on 10/09/2023 . Please arrive there by 1 pm . If you need to reschedule your Scan, you may do so by calling [phone]. Please let us know if you reschedule your scan as we have to get authorization from your insurance for this.    Removing a PEG Tube: What to Expect A PEG tube (percutaneous endoscopic gastrostomy tube) is a feeding tube that is used to put food, medicine, and fluids straight into your stomach. It is placed into your stomach through a small cut in your belly. You may have a PEG tube if: You have trouble swallowing. You don't feel hungry, but you need to eat more. You haven't been able to eat and drink enough to keep your body healthy. When you don't need the PEG tube anymore, your health care provider will do a simple procedure to take it out. Tell your health care provider about: Any allergies you have. All medicines you take. These include vitamins, herbs, eye drops, and creams. Any problems you or family members have had with anesthesia. Any bleeding problems you have. Any surgeries you've had. Any medical conditions you have. Whether you're pregnant or may be pregnant. What are the risks? Your provider will talk with you about risks. These may include: Infection. Bleeding. Damage to nearby structures or organs, such as the stomach, intestines, or liver. Leaking of stomach contents. Allergies to medicines. What happens before the procedure? Ask about changing or stopping: Any medicines you take. Any vitamins, herbs, or supplements you take. Do not take aspirin or ibuprofen unless you're told to. Eat and drink as told. Do not smoke, vape, or use nicotine or tobacco for at least 4 weeks before the procedure. What happens during the procedure?  You'll lie on your back with your belly exposed. The area around the PEG tube will be cleaned  with a soap that kills germs. You may be given anesthesia to keep you from feeling pain. If you have an inflatable balloon at the end of your PEG tube: A syringe will be used to deflate the balloon. Your provider will pull out the tube. You may feel some pressure or stinging. A bandage will be placed. These steps may vary. Ask what you can expect. What happens after the procedure? You'll be watched closely until you leave. This includes checking your pain level, blood pressure, heart rate, and breathing rate. This information is not intended to replace advice given to you by your health care provider. Make sure you discuss any questions you have with your health care provider. Document Revised: 02/14/2023 Document Reviewed: 02/14/2023 Elsevier Patient Education  2024 [employer].

## 2023-10-02 NOTE — Progress Notes (Signed)
 Patient ID: Andrea Sanford, female   DOB: [birth year], 53 y.o.   MRN: [id number]  HPI JANDY BRACKENS is a 53 y.o. female multiple medical problems including bipolar, COPD, active smoker,. A Few weeks ago she presented due to altered mental status requiring hospitalization.  She did have respiratory failure from pneumonia and required tracheostomy as well as feeding tube for nutritional support. I have personally reviwed CT from 3 weeks ago w g tube in good position no acute intra-abd abnormalities She has been improved significantly.  He is able to eat.  Endorses no abdominal pain fevers chills no melena no vomiting, no hematochezia. Keeping her wiegith. She is interested in G tuibe removal.  It was placed by IR 2/10, images reviewed and I agree w report.  HPI  Past Medical History:  Diagnosis Date   Allergy    Anxiety    Arthritis    Asthma    Bipolar 1 disorder (HCC)    Cancer (HCC)    COPD (chronic obstructive pulmonary disease) (HCC)    Depression    Diabetes mellitus without complication (HCC)    Drug abuse (HCC)    Hypertension    Neuromuscular disorder (HCC)    Osteoporosis    Sleep apnea     Past Surgical History:  Procedure Laterality Date   ANKLE SURGERY Right    IR GASTROSTOMY TUBE MOD SED  09/04/2023   PERIPHERAL VASCULAR THROMBECTOMY Left 1991   TRACHEOSTOMY TUBE PLACEMENT N/A 09/01/2023   Procedure: TRACHEOSTOMY;  Surgeon: Geanie Logan, MD;  Location: [facility] ORS;  Service: ENT;  Laterality: N/A;    Family History  Problem Relation Age of Onset   Depression Mother    Diabetes Mother    Cancer Father     Social History Social History   Tobacco Use   Smoking status: Every Day    Current packs/day: 1.00    Types: Cigarettes   Smokeless tobacco: Never  Vaping Use   Vaping status: Some Days  Substance Use Topics   Alcohol use: Not Currently    Alcohol/week: 2.0 standard drinks of alcohol    Types: 2 Standard drinks or equivalent per week   Drug use:  Yes    Types: Cocaine    Comment: uses on occasions    No Known Allergies  Current Outpatient Medications  Medication Sig Dispense Refill   Aspirin-Salicylamide-Caffeine (ARTHRITIS STRENGTH BC POWDER PO) Take by mouth. Takes 2 in the morning     Ca Alginate-Carboxymethylcell PADS Apply 1 each topically daily. 20 each 1   clonazePAM (KLONOPIN) 1 MG tablet Take 1 mg by mouth 2 (two) times daily as needed for anxiety.     doxycycline (VIBRA-TABS) 100 MG tablet Take 1 tablet (100 mg total) by mouth 2 (two) times daily. 10 tablet 0   gabapentin (NEURONTIN) 800 MG tablet TAKE 1 TABLET BY MOUTH 3 TIMES DAILY AS NEEDED. 270 tablet 0   losartan (COZAAR) 25 MG tablet TAKE 1 TABLET (25 MG TOTAL) BY MOUTH DAILY. 30 tablet 0   naproxen (NAPROSYN) 500 MG tablet Take 1 tablet (500 mg total) by mouth 2 (two) times daily with a meal. 60 tablet 0   nystatin (MYCOSTATIN/NYSTOP) powder Apply 1 Application topically 3 (three) times daily. 15 g 0   QUEtiapine (SEROQUEL) 400 MG tablet Take 400 mg by mouth at bedtime.     venlafaxine XR (EFFEXOR-XR) 150 MG 24 hr capsule Take 150 mg by mouth every morning.     potassium chloride (KLOR-CON)  20 MEQ packet Take 20 mEq by mouth 2 (two) times daily for 5 days. 10 packet 0   No current facility-administered medications for this visit.     Review of Systems Full ROS  was asked and was negative except for the information on the HPI  Physical Exam Blood pressure (!) 161/125, pulse 98, temperature 98.4 F (36.9 C), temperature source Oral, height 5\' 7"  (1.702 m), weight 178 lb 12.8 oz (81.1 kg), SpO2 98%. CONSTITUTIONAL: Chronically ill. EYES: Pupils are equal, round, and r Sclera are non-icteric. EARS, NOSE, MOUTH AND THROAT: The oropharynx is clear. The oral mucosa is pink and moist. Hearing is intact to voice.  Tracheostomy in place without issues.  She is able to talk w valve LYMPH NODES:  Lymph nodes in the neck are normal. RESPIRATORY:  Lungs are clear. There  is normal respiratory effort, with equal breath sounds bilaterally, and without pathologic use of accessory muscles. CARDIOVASCULAR: Heart is regular without murmurs, gallops, or rubs. GI: The abdomen is  soft, nontender, and nondistended. There are no palpable masses G tube in place w/o infection or issues/. There is no hepatosplenomegaly. There are normal bowel sounds GU: Rectal deferred.   MUSCULOSKELETAL: Normal muscle strength and tone. No cyanosis or edema.   SKIN: Turgor is good and there are no pathologic skin lesions or ulcers. NEUROLOGIC: Motor and sensation is grossly normal. Cranial nerves are grossly intact. PSYCH:  Oriented to person, place and time. Affect is normal.  Data Reviewed  I have personally reviewed the patient's imaging, laboratory findings and medical records.    Assessment/Plan 53 year old female with recent altered mental status respiratory failure and gastrostomy tube placement vaginal etiology.  She wishes to have to g tube remove.  We typically wait at least 6 weeks before we are able to remove it to avoid any fistulas or complications.  I will also like to do a CT with contrast via the G-tube to make sure there is no leaks or any other intra-abdominal pathology before removing the tube.  I have also arrange for ENT follow-up for the tracheostomy removal.  Discussed with him in detail about my thought process.  She is appreciative. D/W her in detail about HTN and seeking PCP assistance    Please note that I spent 45 minutes in this encounter including extensive review of medical records coordinating her care, placing orders, reviewing images and performing documentation.  Sterling Big, MD FACS General Surgeon 10/02/2023, 11:38 AM

## 2023-10-03 ENCOUNTER — Ambulatory Visit (INDEPENDENT_AMBULATORY_CARE_PROVIDER_SITE_OTHER): Admitting: Family Medicine

## 2023-10-03 ENCOUNTER — Encounter: Payer: Self-pay | Admitting: Family Medicine

## 2023-10-03 VITALS — BP 124/87 | HR 108 | Temp 97.5°F | Resp 18 | Ht 67.0 in | Wt 181.0 lb

## 2023-10-03 DIAGNOSIS — Z23 Encounter for immunization: Secondary | ICD-10-CM

## 2023-10-03 DIAGNOSIS — E1140 Type 2 diabetes mellitus with diabetic neuropathy, unspecified: Secondary | ICD-10-CM | POA: Diagnosis not present

## 2023-10-03 DIAGNOSIS — M62838 Other muscle spasm: Secondary | ICD-10-CM | POA: Insufficient documentation

## 2023-10-03 DIAGNOSIS — Y95 Nosocomial condition: Secondary | ICD-10-CM | POA: Diagnosis not present

## 2023-10-03 DIAGNOSIS — L89103 Pressure ulcer of unspecified part of back, stage 3: Secondary | ICD-10-CM | POA: Insufficient documentation

## 2023-10-03 DIAGNOSIS — L8990 Pressure ulcer of unspecified site, unspecified stage: Secondary | ICD-10-CM | POA: Diagnosis not present

## 2023-10-03 DIAGNOSIS — E118 Type 2 diabetes mellitus with unspecified complications: Secondary | ICD-10-CM | POA: Diagnosis not present

## 2023-10-03 DIAGNOSIS — Z0000 Encounter for general adult medical examination without abnormal findings: Secondary | ICD-10-CM

## 2023-10-03 DIAGNOSIS — E876 Hypokalemia: Secondary | ICD-10-CM | POA: Insufficient documentation

## 2023-10-03 LAB — POCT GLYCOSYLATED HEMOGLOBIN (HGB A1C): Hemoglobin A1C: 6.6 % — AB (ref 4.0–5.6)

## 2023-10-03 MED ORDER — BACLOFEN 10 MG PO TABS
10.0000 mg | ORAL_TABLET | Freq: Three times a day (TID) | ORAL | 1 refills | Status: DC
Start: 2023-10-03 — End: 2023-11-03

## 2023-10-03 NOTE — Assessment & Plan Note (Addendum)
 Looks like it is healing and no signs of infection.  Continue with Neosporin and a Band-Aid. Most important things are very good nutrition and staying off of the site. Lie down on your sides or stomach but stay off your back. Follow-up in a month for recheck

## 2023-10-03 NOTE — Assessment & Plan Note (Signed)
 Offered her Baclofen when she has neck spasms

## 2023-10-03 NOTE — Assessment & Plan Note (Signed)
 Checking her potassium and sodium.

## 2023-10-03 NOTE — Assessment & Plan Note (Signed)
 Drinks Dr. Reino Kent with sugar in it all day.  Need to break this habit.  A1c today 6.6%.

## 2023-10-03 NOTE — Progress Notes (Signed)
 New Patient Office Visit  Subjective    Patient ID: Andrea Sanford, female    DOB: 1971-04-15  Age: 53 y.o. MRN: [id number]  CC:  Chief Complaint  Patient presents with   Medical Management of Chronic Issues    HPI Andrea Sanford presents to establish care 53 year old woman with hypertension, GAD, bipolar disorder, polysubstance abuse.  Admitted to [facility] 1/22 and discharged 09/15/2023 for acute hypoxic respiratory failure requiring endotracheal intubation and G-tube placement.  She was positive for cocaine, marijuana, amphetamines, alcohol.  Question was whether or not the OD was intentional.  She reports that it was not, it was accidental.  She had taken too many of her Klonopin and that started the process.  She has a Therapist, sports.  She is not interested in a treatment program.  She has been to [facility] and been involved in multiple groups including anger management.   She left the hospital before she could have her G tube or tracheotomy removed.  She has appointments with [facility] to have both removed.  She developed a decubitus ulcer while she was in the hospital and is very painful.  She just finished a 10-day course of doxycycline and reports that the pain is improved. She has never had a mammogram or colonoscopy but she would be willing to have screening.  It has been years since she had a Pap test.  Discussed vaccinations.  Outpatient Encounter Medications as of 10/03/2023  Medication Sig   Aspirin-Salicylamide-Caffeine (ARTHRITIS STRENGTH BC POWDER PO) Take by mouth. Takes 2 in the morning   baclofen (LIORESAL) 10 MG tablet Take 1 tablet (10 mg total) by mouth 3 (three) times daily.   Ca Alginate-Carboxymethylcell PADS Apply 1 each topically daily.   clonazePAM (KLONOPIN) 1 MG tablet Take 1 mg by mouth 2 (two) times daily as needed for anxiety.   doxycycline (VIBRA-TABS) 100 MG tablet Take 1 tablet (100 mg total) by mouth 2 (two) times daily.   gabapentin (NEURONTIN) 800  MG tablet TAKE 1 TABLET BY MOUTH 3 TIMES DAILY AS NEEDED.   losartan (COZAAR) 25 MG tablet TAKE 1 TABLET (25 MG TOTAL) BY MOUTH DAILY.   naproxen (NAPROSYN) 500 MG tablet Take 1 tablet (500 mg total) by mouth 2 (two) times daily with a meal.   nystatin (MYCOSTATIN/NYSTOP) powder Apply 1 Application topically 3 (three) times daily.   QUEtiapine (SEROQUEL) 400 MG tablet Take 400 mg by mouth at bedtime.   venlafaxine XR (EFFEXOR-XR) 150 MG 24 hr capsule Take 150 mg by mouth every morning.   potassium chloride (KLOR-CON) 20 MEQ packet Take 20 mEq by mouth 2 (two) times daily for 5 days.   No facility-administered encounter medications on file as of 10/03/2023.    Past Medical History:  Diagnosis Date   Allergy    Anxiety    Arthritis    Asthma    Bipolar 1 disorder (HCC)    Cancer (HCC)    COPD (chronic obstructive pulmonary disease) (HCC)    Depression    Diabetes mellitus without complication (HCC)    Drug abuse (HCC)    Hypertension    Neuromuscular disorder (HCC)    Osteoporosis    Sleep apnea     Past Surgical History:  Procedure Laterality Date   ANKLE SURGERY Right    IR GASTROSTOMY TUBE MOD SED  09/04/2023   PERIPHERAL VASCULAR THROMBECTOMY Left 1991   TRACHEOSTOMY TUBE PLACEMENT N/A 09/01/2023   Procedure: TRACHEOSTOMY;  Surgeon: Geanie Logan, MD;  Location: [facility] ORS;  Service: ENT;  Laterality: N/A;    Family History  Problem Relation Age of Onset   Depression Mother    Diabetes Mother    Cancer Father     Social History   Socioeconomic History   Marital status: Divorced    Spouse name: Not on file   Number of children: Not on file   Years of education: Not on file   Highest education level: Not on file  Occupational History   Not on file  Tobacco Use   Smoking status: Every Day    Current packs/day: 1.00    Types: Cigarettes    Passive exposure: Current   Smokeless tobacco: Never  Vaping Use   Vaping status: Some Days  Substance and Sexual Activity    Alcohol use: Not Currently    Alcohol/week: 2.0 standard drinks of alcohol    Types: 2 Standard drinks or equivalent per week   Drug use: Yes    Types: Cocaine    Comment: uses on occasions   Sexual activity: Yes  Other Topics Concern   Not on file  Social History Narrative   Based on results of PHQ 9 and GAD 7 recommendation that patient follow up within in one week for an assessment with Carey Bullocks LCSW.    Social Drivers of Corporate investment banker Strain: High Risk (08/29/2017)   Overall Financial Resource Strain (CARDIA)    Difficulty of Paying Living Expenses: Very hard  Food Insecurity: Patient Unable To Answer (08/17/2023)   Hunger Vital Sign    Worried About Running Out of Food in the Last Year: Patient unable to answer    Ran Out of Food in the Last Year: Patient unable to answer  Transportation Needs: Patient Unable To Answer (08/17/2023)   PRAPARE - Transportation    Lack of Transportation (Medical): Patient unable to answer    Lack of Transportation (Non-Medical): Patient unable to answer  Physical Activity: Inactive (08/29/2017)   Exercise Vital Sign    Days of Exercise per Week: 0 days    Minutes of Exercise per Session: 0 min  Stress: Stress Concern Present (08/29/2017)   [employer] of Occupational Health - Occupational Stress Questionnaire    Feeling of Stress : Very much  Social Connections: Moderately Isolated (08/29/2017)   Social Connection and Isolation Panel [NHANES]    Frequency of Communication with Friends and Family: Three times a week    Frequency of Social Gatherings with Friends and Family: Once a week    Attends Religious Services: Never    Database administrator or Organizations: No    Attends Banker Meetings: Never    Marital Status: Never married  Intimate Partner Violence: Unknown (08/17/2023)   Humiliation, Afraid, Rape, and Kick questionnaire    Fear of Current or Ex-Partner: Patient unable to answer    Emotionally  Abused: Patient unable to answer    Physically Abused: Not on file    Sexually Abused: Patient unable to answer    ROS      Objective   BP 124/87 (BP Location: Left Arm, Patient Position: Sitting, Cuff Size: Normal)   Pulse (!) 108   Temp (!) 97.5 F (36.4 C) (Oral)   Resp 18   Ht 5\' 7"  (1.702 m)   Wt 181 lb (82.1 kg)   SpO2 98%   BMI 28.35 kg/m    Physical Exam Vitals and nursing note reviewed.  Constitutional:  Appearance: Normal appearance.  HENT:     Head: Normocephalic and atraumatic.  Eyes:     Conjunctiva/sclera: Conjunctivae normal.  Neck:      Comments: Endotracheal tube resent.   Cardiovascular:     Rate and Rhythm: Normal rate and regular rhythm.  Pulmonary:     Effort: Pulmonary effort is normal.     Breath sounds: Normal breath sounds.  Abdominal:       Comments: G-tube present.  No surrounding erythema or drainage  Musculoskeletal:     Right lower leg: No edema.     Left lower leg: No edema.  Skin:    General: Skin is warm and dry.          Comments: 1 cm decubitus ulcer into subcutaneous tissue.  Neurological:     Mental Status: She is alert and oriented to person, place, and time.  Psychiatric:        Mood and Affect: Mood normal.        Behavior: Behavior normal.        Thought Content: Thought content normal.        Judgment: Judgment normal.            The 10-year ASCVD risk score (Arnett DK, et al., 2019) is: 11.2%     Assessment & Plan:  Healthcare maintenance Assessment & Plan: Scheduling for mammogram and colonoscopy, Prevnar 20 vaccine today.  Ask her to schedule for Pap.  Orders: -     Ambulatory referral to Gastroenterology -     3D Screening Mammogram, Left and Right; Future -     CMP14+EGFR -     Lipid panel -     CBC with Differential/Platelet -     TSH + free T4  Need for pneumococcal 20-valent conjugate vaccination -     Pneumococcal conjugate vaccine 20-valent  Type 2 diabetes mellitus with  diabetic neuropathy, without long-term current use of insulin (HCC) -     POCT glycosylated hemoglobin (Hb A1C)  Pressure ulcer acquired in community hospital Assessment & Plan: Looks like it is healing and no signs of infection.  Continue with Neosporin and a Band-Aid. Most important things are very good nutrition and staying off of the site. Lie down on your sides or stomach but stay off your back. Follow-up in a month for recheck    Neck muscle spasm Assessment & Plan: Offered her Baclofen when she has neck spasms    Orders: -     Baclofen; Take 1 tablet (10 mg total) by mouth 3 (three) times daily.  Dispense: 30 each; Refill: 1  Hypokalemia Assessment & Plan: Checking her potassium and sodium.     Controlled type 2 diabetes mellitus with complication, without long-term current use of insulin (HCC) Assessment & Plan: Drinks Dr. Reino Kent with sugar in it all day.  Need to break this habit.  A1c today 6.6%.     Return in about 2 weeks (around 10/17/2023).   Alease Medina, MD

## 2023-10-03 NOTE — Assessment & Plan Note (Signed)
 Neosporin and a Band-Aid.  Most important things are very good nutrition and staying off of the site.  Lie down on your sides or stomach but stay off your back.  Follow-up in a month for recheck

## 2023-10-03 NOTE — Assessment & Plan Note (Signed)
 Scheduling for mammogram and colonoscopy, Prevnar 20 vaccine today.  Ask her to schedule for Pap.

## 2023-10-04 LAB — CBC WITH DIFFERENTIAL/PLATELET
Basophils Absolute: 0.1 10*3/uL (ref 0.0–0.2)
Basos: 1 %
EOS (ABSOLUTE): 0.1 10*3/uL (ref 0.0–0.4)
Eos: 2 %
Hematocrit: 44 % (ref 34.0–46.6)
Hemoglobin: 13.7 g/dL (ref 11.1–15.9)
Immature Grans (Abs): 0 10*3/uL (ref 0.0–0.1)
Immature Granulocytes: 0 %
Lymphocytes Absolute: 1.7 10*3/uL (ref 0.7–3.1)
Lymphs: 25 %
MCH: 29.1 pg (ref 26.6–33.0)
MCHC: 31.1 g/dL — ABNORMAL LOW (ref 31.5–35.7)
MCV: 94 fL (ref 79–97)
Monocytes Absolute: 0.4 10*3/uL (ref 0.1–0.9)
Monocytes: 5 %
Neutrophils Absolute: 4.6 10*3/uL (ref 1.4–7.0)
Neutrophils: 67 %
Platelets: 237 10*3/uL (ref 150–450)
RBC: 4.7 x10E6/uL (ref 3.77–5.28)
RDW: 13 % (ref 11.7–15.4)
WBC: 6.9 10*3/uL (ref 3.4–10.8)

## 2023-10-04 LAB — LIPID PANEL
Chol/HDL Ratio: 4.3 ratio (ref 0.0–4.4)
Cholesterol, Total: 213 mg/dL — ABNORMAL HIGH (ref 100–199)
HDL: 49 mg/dL (ref >39)
LDL Chol Calc (NIH): 137 mg/dL — ABNORMAL HIGH (ref 0–99)
Triglycerides: 152 mg/dL — ABNORMAL HIGH (ref 0–149)
VLDL Cholesterol Cal: 27 mg/dL (ref 5–40)

## 2023-10-04 LAB — CMP14+EGFR
ALT: 9 IU/L (ref 0–32)
AST: 13 IU/L (ref 0–40)
Albumin: 4 g/dL (ref 3.8–4.9)
Alkaline Phosphatase: 166 IU/L — ABNORMAL HIGH (ref 44–121)
BUN/Creatinine Ratio: 13 (ref 9–23)
BUN: 11 mg/dL (ref 6–24)
Bilirubin Total: 0.2 mg/dL (ref 0.0–1.2)
CO2: 22 mmol/L (ref 20–29)
Calcium: 9.3 mg/dL (ref 8.7–10.2)
Chloride: 101 mmol/L (ref 96–106)
Creatinine, Ser: 0.83 mg/dL (ref 0.57–1.00)
Globulin, Total: 3 g/dL (ref 1.5–4.5)
Glucose: 237 mg/dL — ABNORMAL HIGH (ref 70–99)
Potassium: 3.7 mmol/L (ref 3.5–5.2)
Sodium: 139 mmol/L (ref 134–144)
Total Protein: 7 g/dL (ref 6.0–8.5)
eGFR: 84 mL/min/{1.73_m2} (ref >59)

## 2023-10-04 LAB — TSH+FREE T4
Free T4: 1.12 ng/dL (ref 0.82–1.77)
TSH: 1.54 u[IU]/mL (ref 0.450–4.500)

## 2023-10-09 ENCOUNTER — Telehealth: Payer: Self-pay

## 2023-10-09 ENCOUNTER — Ambulatory Visit

## 2023-10-09 NOTE — Telephone Encounter (Signed)
 Patient is unable to schedule her colonoscopy att this time due to transportation.  Reminder letter mailed.  Thanks,  [place], [place]

## 2023-10-15 ENCOUNTER — Emergency Department

## 2023-10-15 ENCOUNTER — Emergency Department
Admission: EM | Admit: 2023-10-15 | Discharge: 2023-10-15 | Disposition: A | Discharge status: Home / Self Care | Attending: Emergency Medicine | Admitting: Emergency Medicine

## 2023-10-15 DIAGNOSIS — R059 Cough, unspecified: Secondary | ICD-10-CM | POA: Diagnosis present

## 2023-10-15 DIAGNOSIS — E119 Type 2 diabetes mellitus without complications: Secondary | ICD-10-CM | POA: Diagnosis not present

## 2023-10-15 DIAGNOSIS — J449 Chronic obstructive pulmonary disease, unspecified: Secondary | ICD-10-CM | POA: Insufficient documentation

## 2023-10-15 DIAGNOSIS — J329 Chronic sinusitis, unspecified: Secondary | ICD-10-CM | POA: Diagnosis not present

## 2023-10-15 LAB — RESP PANEL BY RT-PCR (RSV, FLU A&B, COVID)  RVPGX2
Influenza A by PCR: NEGATIVE
Influenza B by PCR: NEGATIVE
Resp Syncytial Virus by PCR: NEGATIVE
SARS Coronavirus 2 by RT PCR: NEGATIVE

## 2023-10-15 MED ORDER — AMOXICILLIN-POT CLAVULANATE 875-125 MG PO TABS
1.0000 | ORAL_TABLET | Freq: Once | ORAL | Status: AC
  Administered 2023-10-15: 1 via ORAL
  Filled 2023-10-15: qty 1

## 2023-10-15 MED ORDER — AMOXICILLIN-POT CLAVULANATE 875-125 MG PO TABS: 1.0000 | ORAL_TABLET | Freq: Two times a day (BID) | ORAL | 0 refills | Status: DC

## 2023-10-15 NOTE — ED Triage Notes (Signed)
 Pt presents to the ED POV from home with fmaily. Pt reports sinus congestion causing facial pressure for 1.5 weeks. Pt reports having allergies and is concerned about a sinus infection. Pt does have a cough.  Pt reports having a CT scan on Wednesday of her abdomen to potentially get her feeding tube taken out. Pt states that she is concerned that the feeding tube may be pulling out.

## 2023-10-15 NOTE — ED Provider Notes (Signed)
 [facility] Provider Note    Event Date/Time   First MD Initiated Contact with Patient 10/15/23 1408     (approximate)  History   Chief Complaint: Nasal Congestion and feeding tube issue  HPI  Andrea Sanford is a 53 y.o. female with a past medical history of bipolar, COPD, diabetes, tracheostomy and G-tube due to an overdose/ICU stay, presents to the emergency department for nasal congestion cough as well as a G-tube concern.  According to the patient for the past week or so she has been experiencing pain and pressure in her sinuses with nasal congestion and occasional cough.  States occasionally she will get sputum production with a cough.  Patient denies any fever.  As a secondary complaint patient also states she has noticed that the G-tube has pulled away from the skin somewhat and is leaking on occasion.  Physical Exam   Triage Vital Signs: ED Triage Vitals  Encounter Vitals Group     BP 10/15/23 1247 (!) 138/96     Systolic BP Percentile --      Diastolic BP Percentile --      Pulse Rate 10/15/23 1247 (!) 104     Resp 10/15/23 1244 18     Temp 10/15/23 1244 97.9 F (36.6 C)     Temp Source 10/15/23 1244 Oral     SpO2 10/15/23 1244 100 %     Weight 10/15/23 1246 180 lb (81.6 kg)     Height 10/15/23 1246 5\' 5"  (1.651 m)     Head Circumference --      Peak Flow --      Pain Score 10/15/23 1246 0     Pain Loc --      Pain Education --      Exclude from Growth Chart --     Most recent vital signs: Vitals:   10/15/23 1244 10/15/23 1247  BP:  (!) 138/96  Pulse:  (!) 104  Resp: 18   Temp: 97.9 F (36.6 C)   SpO2: 100%     General: Awake, no distress.  Mild rhinorrhea. CV:  Good peripheral perfusion.  Regular rate and rhythm  Resp:  Normal effort.  Equal breath sounds bilaterally.  No wheeze rales or rhonchi Abd:  No distention.  Soft, nontender.  No rebound or guarding.  G-tube present.  I adjusted the G-tube so it is now flush/tight.   There is contents in the G-tube we will flush the G-tube and showed the patient how to flush the G-tube.  ED Results / Procedures / Treatments   RADIOLOGY  I have reviewed interpret the chest x-ray images.  No consolidation on my evaluation. Radiology has read the x-ray as negative.   MEDICATIONS ORDERED IN ED: Medications  amoxicillin-clavulanate (AUGMENTIN) 875-125 MG per tablet 1 tablet (has no administration in time range)     IMPRESSION / MDM / ASSESSMENT AND PLAN / ED COURSE  I reviewed the triage vital signs and the nursing notes.  Patient's presentation is most consistent with acute presentation with potential threat to life or bodily function.  Patient presents to the emergency department for cough congestion and sinus pressure ongoing over the past 1 week.  Overall patient appears well.  Does have mild rhinorrhea and occasional cough.  Reassuring vital signs.  Patient's COVID/flu/RSV are negative, chest x-ray is clear.  Will cover with Augmentin twice daily for the next 10 days to cover for possible sinusitis or mild bronchitis.  I was able  to adjust the patient's G-tube to hopefully prevent any further leaking.  We will show the patient had a flush the G-tube she has a follow-up appointment this coming week for evaluation for removal of the G-tube.  Patient states she does not use the G-tube for any reason.  FINAL CLINICAL IMPRESSION(S) / ED DIAGNOSES   Sinusitis     Note:  This document was prepared using Dragon voice recognition software and may include unintentional dictation errors.   Minna Antis, MD 10/15/23 1429

## 2023-10-16 ENCOUNTER — Ambulatory Visit: Admitting: Surgery

## 2023-10-17 ENCOUNTER — Ambulatory Visit: Admitting: Family Medicine

## 2023-10-18 ENCOUNTER — Ambulatory Visit
Admission: RE | Admit: 2023-10-18 | Discharge: 2023-10-18 | Disposition: A | Discharge status: Home / Self Care | Source: Ambulatory Visit | Attending: Surgery | Admitting: Surgery

## 2023-10-18 DIAGNOSIS — Z931 Gastrostomy status: Secondary | ICD-10-CM | POA: Diagnosis present

## 2023-10-18 MED ORDER — IOHEXOL 9 MG/ML PO SOLN
500.0000 mL | ORAL | Status: AC
  Administered 2023-10-18: 600 mL via ORAL

## 2023-10-25 ENCOUNTER — Telehealth: Payer: Self-pay | Admitting: Surgery

## 2023-10-25 ENCOUNTER — Ambulatory Visit: Admitting: Surgery

## 2023-10-25 NOTE — Telephone Encounter (Signed)
 Patient has no showed and rescheduled appointments on multiple occasions.  She is rescheduled for 10/30/23 and strongly encouraged to keep her appointment this time and arrive 15 minutes prior to her appointment.

## 2023-10-27 ENCOUNTER — Ambulatory Visit: Admitting: Family Medicine

## 2023-10-30 ENCOUNTER — Encounter: Payer: Self-pay | Admitting: Surgery

## 2023-10-30 ENCOUNTER — Ambulatory Visit (INDEPENDENT_AMBULATORY_CARE_PROVIDER_SITE_OTHER): Admitting: Surgery

## 2023-10-30 VITALS — BP 168/96 | HR 74 | Temp 98.2°F | Ht 66.0 in | Wt 185.6 lb

## 2023-10-30 DIAGNOSIS — Z431 Encounter for attention to gastrostomy: Secondary | ICD-10-CM | POA: Diagnosis not present

## 2023-10-30 DIAGNOSIS — Z931 Gastrostomy status: Secondary | ICD-10-CM

## 2023-10-30 NOTE — Progress Notes (Signed)
 Outpatient Surgical Follow Up  10/30/2023  Andrea Sanford is an 53 y.o. female.   Chief Complaint  Patient presents with   Follow-up    Discuss g tube removal     HPI:  Andrea Sanford is a 53 y.o. female multiple medical problems including bipolar, COPD, active smoker,.a few months ago she presented due to altered mental status requiring hospitalization.  She did have respiratory failure from pneumonia and required tracheostomy as well as feeding tube for nutritional support. She came for g tube removal that was placed by IR. I did perfrom CT w contrast that I have personally reviwed w g tube in good position no acute intra-abd abnormalities She has been improved significantly.  SHe is able to eat and no longer using G tube.  Endorses no abdominal pain fevers chills no melena no vomiting, no hematochezia. Keeping her wiegith. She is interested in G tuibe removal. G tube was placed by IR 2/10, images reviewed HPI   Past Surgical History:  Procedure Laterality Date   ANKLE SURGERY Right    IR GASTROSTOMY TUBE MOD SED  09/04/2023   PERIPHERAL VASCULAR THROMBECTOMY Left 1991   TRACHEOSTOMY TUBE PLACEMENT N/A 09/01/2023   Procedure: TRACHEOSTOMY;  Surgeon: Geanie Logan, MD;  Location: [facility] ORS;  Service: ENT;  Laterality: N/A;    Family History  Problem Relation Age of Onset   Depression Mother    Diabetes Mother    Cancer Father     Social History:  reports that she has been smoking cigarettes. She has been exposed to tobacco smoke. She has never used smokeless tobacco. She reports that she does not currently use alcohol after a past usage of about 2.0 standard drinks of alcohol per week. She reports current drug use. Drug: Cocaine.  Allergies: No Known Allergies  Medications reviewed.    ROS Full ROS performed and is otherwise negative other than what is stated in HPI   BP (!) 168/96   Pulse 74   Temp 98.2 F (36.8 C) (Oral)   Ht 5\' 6"  (1.676 m)   Wt 185 lb 9.6  oz (84.2 kg)   SpO2 98%   BMI 29.96 kg/m   Physical Exam  ONSTITUTIONAL: Chronically ill. EYES: Pupils are equal, round,  EARS, NOSE, MOUTH AND THROAT: The oropharynx is clear. The oral mucosa is pink and moist. Hearing is intact to voice.  Tracheostomy in place without issues.  She is able to talk w valve LYMPH NODES:  Lymph nodes in the neck are normal. RESPIRATORY:  Lungs are clear. There is normal respiratory effort, with equal breath sounds bilaterally, and without pathologic use of accessory muscles. CARDIOVASCULAR: Heart is regular without murmurs, gallops, or rubs. GI: The abdomen is  soft, nontender, and nondistended. There are no palpable masses G tube in place w/o infection or issues/.  I noticed there was no balloon, I pulled on the tube and noticed the mushroom did not deform, I aborted the removal since it will probably require sedation and further intervention by IR GU: Rectal deferred.   MUSCULOSKELETAL: Normal muscle strength and tone. No cyanosis or edema.   SKIN: Turgor is good and there are no pathologic skin lesions or ulcers. NEUROLOGIC: Motor and sensation is grossly normal. Cranial nerves are grossly intact. PSYCH:  Oriented to person, place and time. Affect is normal.  Assessment/Plan: G tube ready to be removed , no evidence of complications. I could not remove it since tube did not have a balloon. We  will make arrangements for IR to remove it  Please note that I spent 30 min in this encounter including pers. Reviewing images studies, coordination of care, reviewing of medical records, placing orders, counseling the pt and performing documentation  Sterling Big, MD [facility] General Surgeon

## 2023-11-02 NOTE — Progress Notes (Signed)
 Patient for IR Gastrostomy Tube removal on Friday 11/03/23, I called and spoke with the patient on the phone and gave pre-procedure instructions. Pt was made aware to be here at 11:15a and check into the new entrance. Pt stated understanding.  Called 11/02/23

## 2023-11-03 ENCOUNTER — Ambulatory Visit
Admission: RE | Admit: 2023-11-03 | Discharge: 2023-11-03 | Disposition: A | Discharge status: Home / Self Care | Source: Ambulatory Visit | Attending: Surgery | Admitting: Surgery

## 2023-11-03 ENCOUNTER — Other Ambulatory Visit: Payer: Self-pay | Admitting: Family Medicine

## 2023-11-03 ENCOUNTER — Ambulatory Visit (INDEPENDENT_AMBULATORY_CARE_PROVIDER_SITE_OTHER): Admitting: Family Medicine

## 2023-11-03 ENCOUNTER — Encounter: Payer: Self-pay | Admitting: Family Medicine

## 2023-11-03 ENCOUNTER — Other Ambulatory Visit: Payer: Self-pay | Admitting: Podiatry

## 2023-11-03 ENCOUNTER — Other Ambulatory Visit: Payer: Self-pay | Admitting: Surgery

## 2023-11-03 VITALS — BP 161/116 | HR 87 | Temp 97.4°F | Resp 18 | Ht 66.0 in | Wt 183.0 lb

## 2023-11-03 DIAGNOSIS — M79672 Pain in left foot: Secondary | ICD-10-CM

## 2023-11-03 DIAGNOSIS — E782 Mixed hyperlipidemia: Secondary | ICD-10-CM

## 2023-11-03 DIAGNOSIS — J302 Other seasonal allergic rhinitis: Secondary | ICD-10-CM | POA: Diagnosis not present

## 2023-11-03 DIAGNOSIS — E1140 Type 2 diabetes mellitus with diabetic neuropathy, unspecified: Secondary | ICD-10-CM | POA: Diagnosis not present

## 2023-11-03 DIAGNOSIS — E1169 Type 2 diabetes mellitus with other specified complication: Secondary | ICD-10-CM | POA: Diagnosis not present

## 2023-11-03 DIAGNOSIS — I10 Essential (primary) hypertension: Secondary | ICD-10-CM

## 2023-11-03 DIAGNOSIS — G6289 Other specified polyneuropathies: Secondary | ICD-10-CM

## 2023-11-03 DIAGNOSIS — Z931 Gastrostomy status: Secondary | ICD-10-CM

## 2023-11-03 DIAGNOSIS — Z0000 Encounter for general adult medical examination without abnormal findings: Secondary | ICD-10-CM

## 2023-11-03 DIAGNOSIS — M79671 Pain in right foot: Secondary | ICD-10-CM

## 2023-11-03 DIAGNOSIS — M62838 Other muscle spasm: Secondary | ICD-10-CM

## 2023-11-03 DIAGNOSIS — L8990 Pressure ulcer of unspecified site, unspecified stage: Secondary | ICD-10-CM | POA: Diagnosis not present

## 2023-11-03 DIAGNOSIS — B372 Candidiasis of skin and nail: Secondary | ICD-10-CM | POA: Diagnosis not present

## 2023-11-03 DIAGNOSIS — K219 Gastro-esophageal reflux disease without esophagitis: Secondary | ICD-10-CM | POA: Diagnosis not present

## 2023-11-03 DIAGNOSIS — G629 Polyneuropathy, unspecified: Secondary | ICD-10-CM | POA: Diagnosis not present

## 2023-11-03 HISTORY — PX: IR PATIENT EVAL TECH 0-60 MINS: IMG5564

## 2023-11-03 MED ORDER — LOSARTAN POTASSIUM 25 MG PO TABS
25.0000 mg | ORAL_TABLET | Freq: Every day | ORAL | 5 refills | Status: DC
Start: 2023-11-03 — End: 2023-12-08

## 2023-11-03 MED ORDER — MONTELUKAST SODIUM 10 MG PO TABS: 10.0000 mg | ORAL_TABLET | Freq: Every day | ORAL | 5 refills | Status: DC

## 2023-11-03 MED ORDER — GABAPENTIN 800 MG PO TABS
800.0000 mg | ORAL_TABLET | Freq: Three times a day (TID) | ORAL | 3 refills | Status: DC
Start: 2023-11-03 — End: 2023-12-08

## 2023-11-03 MED ORDER — BACLOFEN 10 MG PO TABS: 10.0000 mg | ORAL_TABLET | Freq: Three times a day (TID) | ORAL | 1 refills | Status: DC

## 2023-11-03 MED ORDER — AMLODIPINE BESYLATE 5 MG PO TABS
5.0000 mg | ORAL_TABLET | Freq: Every day | ORAL | 1 refills | Status: DC
Start: 2023-11-03 — End: 2023-12-08

## 2023-11-03 MED ORDER — BLOOD GLUCOSE TEST VI STRP: 1.0000 | ORAL_STRIP | Freq: Three times a day (TID) | 0 refills | Status: AC

## 2023-11-03 MED ORDER — OMEPRAZOLE 40 MG PO CPDR: 40.0000 mg | DELAYED_RELEASE_CAPSULE | Freq: Every day | ORAL | 3 refills | Status: DC

## 2023-11-03 MED ORDER — LANCETS MISC. MISC: 1.0000 | Freq: Three times a day (TID) | 0 refills | Status: AC

## 2023-11-03 MED ORDER — LANCET DEVICE MISC: 1.0000 | Freq: Three times a day (TID) | 0 refills | Status: AC

## 2023-11-03 MED ORDER — ATORVASTATIN CALCIUM 40 MG PO TABS: 40.0000 mg | ORAL_TABLET | Freq: Every day | ORAL | 3 refills | Status: DC

## 2023-11-03 MED ORDER — NYSTATIN 100000 UNIT/GM EX POWD: 1.0000 | Freq: Three times a day (TID) | CUTANEOUS | 1 refills | Status: DC

## 2023-11-03 MED ORDER — BLOOD GLUCOSE MONITORING SUPPL DEVI: 1.0000 | Freq: Three times a day (TID) | 0 refills | Status: DC

## 2023-11-03 NOTE — Assessment & Plan Note (Signed)
 Never had a mammogram so getting that scheduled.  Needs to be screened for colon cancer.  No family history of colon cancer so will set up Cologuard.

## 2023-11-03 NOTE — Assessment & Plan Note (Addendum)
 She has DMT2 so the goal is LDL less than 70. Last lipid profile: total 213, TRIGS 152, HDL 49 and LDL 132.  She was not taking a statin.  Start atorvastatin 40mg  and check her labs in three months

## 2023-11-03 NOTE — Assessment & Plan Note (Signed)
 Currently controlling diabetes with diet and exercise alone.  Will get a glucometer for her and she can check her fasting BG.  Reminded her to wear shoes all the time and to see the eye doctor annually.

## 2023-11-03 NOTE — Assessment & Plan Note (Signed)
 Reports less pain and it is healing.

## 2023-11-03 NOTE — Telephone Encounter (Signed)
 Pt has one refill remaining for baclofen.

## 2023-11-03 NOTE — Progress Notes (Signed)
 Established Patient Office Visit  Subjective   Patient ID: Andrea Sanford, female    DOB: [birth year]  Age: 53 y.o. MRN: [id number]  Chief Complaint  Patient presents with   Medical Management of Chronic Issues   Foot Burn    bilateral    HPI [name] with HTN, DMT2 (09/14/23 A1c 6.8%, diet controlled), GAD/ bipolar disorder/ polysubstance abuse (under the care of psychiatry), Hx acute hypoxic respiratory failure requiring endotracheal intubation and G-tube placement (polysubstance overdose) still with G-tube and tracheostomy tube present.  She tells me that she has seen GI and had a CT scan of the abdomen and she is to get her G-tube removed today.  She is scheduled to get her tracheostomy tube removed 11/06/2023.   She also reports that she is out of her blood pressure pill, losartan 25 mg daily.  She has a pressure ulcer from being in ICU on her back for so long. She states that it is healing and not nearly as painful.  She does complain of burning and stinging in her legs and feet especially at night.  It is hard to go to sleep with this pain.  She already takes gabapentin 800 mg 3 times daily. She is following with psychiatry and has stopped drinking alcohol.  Her boyfriend drinks but not to excess.  Does not bother her that her boyfriend drinks because she is not craving ETOH.   She is controlling her diabetes with her diet.  She would like a glucometer.  Advised checking her BS first thing in the morning is most helpful. She has never had a mammogram nor been screened for colon cancer.  Discussed Cologuard and she is willing to do this.  She is out of omeprazole and is having reflux.  She request to go to podiatry because her feet hurt and she has bunions on her great toes bilaterally.     ROS    Objective:     BP (!) 161/116 (BP Location: Left Arm, Patient Position: Sitting, Cuff Size: Normal) Comment: has taken med in 3 days  Pulse 87   Temp (!) 97.4 F  (36.3 C) (Oral)   Resp 18   Ht 5\' 6"  (1.676 m)   Wt 183 lb (83 kg)   SpO2 98%   BMI 29.54 kg/m    Physical Exam Vitals and nursing note reviewed.  Constitutional:      Appearance: Normal appearance.  HENT:     Head: Normocephalic and atraumatic.  Eyes:     Conjunctiva/sclera: Conjunctivae normal.  Cardiovascular:     Rate and Rhythm: Normal rate and regular rhythm.  Pulmonary:     Effort: Pulmonary effort is normal.     Breath sounds: Normal breath sounds.  Musculoskeletal:     Right lower leg: No edema.     Left lower leg: No edema.  Skin:    General: Skin is warm and dry.  Neurological:     Mental Status: She is alert and oriented to person, place, and time.  Psychiatric:        Mood and Affect: Mood normal.        Behavior: Behavior normal.        Thought Content: Thought content normal.        Judgment: Judgment normal.          No results found for any visits on 11/03/23.    The 10-year ASCVD risk score (Arnett DK, et al., 2019) is: 20.7%  Assessment & Plan:  Candidal dermatitis -     Nystatin; Apply 1 Application topically 3 (three) times daily.  Dispense: 15 g; Refill: 1  Primary hypertension Assessment & Plan: She is out of losartan 25 mg but did not have good control with Losartan alone.  Adding Norvasc 5 mg today.  FU in a month to check BP  Orders: -     Losartan Potassium; Take 1 tablet (25 mg total) by mouth daily.  Dispense: 30 tablet; Refill: 5 -     amLODIPine Besylate; Take 1 tablet (5 mg total) by mouth daily.  Dispense: 90 tablet; Refill: 1  Other polyneuropathy -     Gabapentin; Take 1 tablet (800 mg total) by mouth 3 (three) times daily.  Dispense: 90 tablet; Refill: 3  Type 2 diabetes mellitus with other specified complication, without long-term current use of insulin (HCC) -     Blood Glucose Monitoring Suppl; 1 each by Does not apply route in the morning, at noon, and at bedtime. May substitute to any manufacturer covered by  patient's insurance.  Dispense: 1 each; Refill: 0 -     Blood Glucose Test; 1 each by In Vitro route in the morning, at noon, and at bedtime. May substitute to any manufacturer covered by patient's insurance.  Dispense: 100 strip; Refill: 0 -     Lancet Device; 1 each by Does not apply route in the morning, at noon, and at bedtime. May substitute to any manufacturer covered by patient's insurance.  Dispense: 1 each; Refill: 0 -     Lancets Misc.; 1 each by Does not apply route in the morning, at noon, and at bedtime. May substitute to any manufacturer covered by patient's insurance.  Dispense: 100 each; Refill: 0  Healthcare maintenance Assessment & Plan: Never had a mammogram so getting that scheduled.  Needs to be screened for colon cancer.  No family history of colon cancer so will set up Cologuard.    Orders: -     Cologuard -     3D Screening Mammogram, Left and Right; Future  Foot pain, bilateral -     Ambulatory referral to Podiatry  Gastroesophageal reflux disease, unspecified whether esophagitis present -     Omeprazole; Take 1 capsule (40 mg total) by mouth daily.  Dispense: 30 capsule; Refill: 3  Mixed hyperlipidemia Assessment & Plan: She has DMT2 so the goal is LDL less than 70. Last lipid profile: total 213, TRIGS 152, HDL 49 and LDL 132.  She was not taking a statin.  Start atorvastatin 40mg  and check her labs in three months  Orders: -     Atorvastatin Calcium; Take 1 tablet (40 mg total) by mouth daily.  Dispense: 90 tablet; Refill: 3  Seasonal allergic rhinitis, unspecified trigger -     Montelukast Sodium; Take 1 tablet (10 mg total) by mouth at bedtime.  Dispense: 30 tablet; Refill: 5  Polyneuropathy Assessment & Plan: She is already on Gabapentin 800mg  TID.  May consider trial of amitriptyline 25mg  at night.     Pressure ulcer acquired in community hospital Assessment & Plan: Reports less pain and it is healing.     Type 2 diabetes mellitus with diabetic  neuropathy, without long-term current use of insulin (HCC) Assessment & Plan: Currently controlling diabetes with diet and exercise alone.  Will get a glucometer for her and she can check her fasting BG.  Reminded her to wear shoes all the time and to see the eye doctor  annually.   Gastroesophageal reflux disease without esophagitis Assessment & Plan: Does not have reflux if she takes her Omeprazole.        Return in about 1 month (around 12/03/2023).    Alease Medina, MD

## 2023-11-03 NOTE — Progress Notes (Signed)
 Patient presented to IR today for removal of 20 Fr disc retention gastrostomy placed in IR 09/04/23. She reports that she has never used the G tube since leaving the hospital and would like it removed.  Discussed procedure with patient who gave verbal consent to proceed.  After the initial tug on the gastrostomy tube the patient requested that the procedure be stopped stating she was in too much pain and could not tolerate this. She states that she would likely need some medication to help her get through this procedure. She did not allow any further manipulation of the tube.  Small amount of bleeding from insertion site noted, split gauze applied between bumper and skin to prevent soiling clothes. Patient was instructed to remove the split gauze later today.  Plan: - Reschedule G tube removal with single drug, likely would not require full moderate sedation with both fentanyl and versed. Patient aware IR scheduler will call her to setup appointment - Patient instructed to not use the gastrostomy tube as it may be displaced from the gastric lumen due to attempted removal. She states understanding and has no plans to use the G-tube  Lynnette Caffey, PA-C

## 2023-11-03 NOTE — Assessment & Plan Note (Signed)
 Does not have reflux if she takes her Omeprazole.

## 2023-11-03 NOTE — Procedures (Signed)
 Procedure aborted- patient refusal due to pain, to be rescheduled with sedation.

## 2023-11-03 NOTE — Assessment & Plan Note (Signed)
 She is already on Gabapentin 800mg  TID.  May consider trial of amitriptyline 25mg  at night.

## 2023-11-03 NOTE — Telephone Encounter (Unsigned)
 Copied from CRM [id number]. Topic: Clinical - Medication Refill >> Nov 03, 2023 11:11 AM Fuller Mandril wrote: Most Recent Primary Care Visit:  Provider: Alease Medina  Department: [facility]  Visit Type: OFFICE VISIT  Date: 11/03/2023  Medication: baclofen (LIORESAL) 10 MG tablet - patient just had appt today - discussed everything except neck concerns. Requesting refill for muscle relaxer   Has the patient contacted their pharmacy? No (Agent: If no, request that the patient contact the pharmacy for the refill. If patient does not wish to contact the pharmacy document the reason why and proceed with request.) (Agent: If yes, when and what did the pharmacy advise?)  Is this the correct pharmacy for this prescription? Yes If no, delete pharmacy and type the correct one.  This is the patient's preferred pharmacy:  [facility] [address], [place] - [address] [place] [address] Phone: [phone] Fax: [phone]   Has the prescription been filled recently? Yes  Is the patient out of the medication? No  Has the patient been seen for an appointment in the last year OR does the patient have an upcoming appointment? Yes - Today   Can we respond through MyChart? No  Agent: Please be advised that Rx refills may take up to 3 business days. We ask that you follow-up with your pharmacy.

## 2023-11-03 NOTE — Assessment & Plan Note (Signed)
 She is out of losartan 25 mg but did not have good control with Losartan alone.  Adding Norvasc 5 mg today.  FU in a month to check BP

## 2023-11-06 DIAGNOSIS — Z430 Encounter for attention to tracheostomy: Secondary | ICD-10-CM | POA: Diagnosis not present

## 2023-11-06 DIAGNOSIS — J449 Chronic obstructive pulmonary disease, unspecified: Secondary | ICD-10-CM | POA: Diagnosis not present

## 2023-11-08 ENCOUNTER — Other Ambulatory Visit: Payer: Self-pay | Admitting: Radiology

## 2023-11-08 NOTE — Progress Notes (Signed)
 Patient for IR Gastrostomy Tube Removal on Thurs 11/09/23, I called and spoke with the patient on the phone and gave pre-procedure instructions. Pt was made aware to be here at 10a, NPO after MN prior to procedure as well as driver post procedure/recovery/discharge. Pt stated understanding.  Called 11/08/23

## 2023-11-09 ENCOUNTER — Ambulatory Visit
Admission: RE | Admit: 2023-11-09 | Discharge: 2023-11-09 | Disposition: A | Discharge status: Home / Self Care | Source: Ambulatory Visit | Attending: Podiatry | Admitting: Radiology

## 2023-11-09 MED ORDER — MIDAZOLAM HCL 2 MG/ML PO SYRP: 8.0000 mg | ORAL_SOLUTION | Freq: Once | ORAL | Status: DC

## 2023-11-10 ENCOUNTER — Telehealth: Payer: Self-pay | Admitting: Family Medicine

## 2023-11-10 NOTE — Telephone Encounter (Signed)
 Copied from CRM [id number]. Topic: Clinical - Prescription Issue >> Nov 10, 2023 10:51 AM Baldemar Lev wrote: Reason for CRM: Pt called requesting to speak to the clinic about her muscle relaxer, does not know the name of it.   Best contact: [phone]

## 2023-11-13 NOTE — Telephone Encounter (Signed)
 Unable to reach patient number provider nor on file.

## 2023-11-18 LAB — COLOGUARD

## 2023-11-20 ENCOUNTER — Emergency Department
Admission: EM | Admit: 2023-11-20 | Discharge: 2023-11-20 | Discharge status: Against Medical Advice | Source: Home / Self Care

## 2023-11-20 DIAGNOSIS — I959 Hypotension, unspecified: Secondary | ICD-10-CM | POA: Diagnosis not present

## 2023-11-20 DIAGNOSIS — R58 Hemorrhage, not elsewhere classified: Secondary | ICD-10-CM | POA: Diagnosis not present

## 2023-11-21 ENCOUNTER — Emergency Department

## 2023-11-21 ENCOUNTER — Emergency Department
Admission: EM | Admit: 2023-11-21 | Discharge: 2023-11-21 | Disposition: A | Discharge status: Home / Self Care | Attending: Emergency Medicine | Admitting: Emergency Medicine

## 2023-11-21 ENCOUNTER — Encounter: Payer: Self-pay | Admitting: Emergency Medicine

## 2023-11-21 ENCOUNTER — Other Ambulatory Visit: Payer: Self-pay

## 2023-11-21 DIAGNOSIS — S022XXA Fracture of nasal bones, initial encounter for closed fracture: Secondary | ICD-10-CM | POA: Diagnosis not present

## 2023-11-21 DIAGNOSIS — Z7982 Long term (current) use of aspirin: Secondary | ICD-10-CM | POA: Diagnosis not present

## 2023-11-21 DIAGNOSIS — S40812A Abrasion of left upper arm, initial encounter: Secondary | ICD-10-CM | POA: Diagnosis not present

## 2023-11-21 DIAGNOSIS — J449 Chronic obstructive pulmonary disease, unspecified: Secondary | ICD-10-CM | POA: Insufficient documentation

## 2023-11-21 DIAGNOSIS — F1090 Alcohol use, unspecified, uncomplicated: Secondary | ICD-10-CM | POA: Diagnosis not present

## 2023-11-21 DIAGNOSIS — E876 Hypokalemia: Secondary | ICD-10-CM

## 2023-11-21 DIAGNOSIS — Z859 Personal history of malignant neoplasm, unspecified: Secondary | ICD-10-CM | POA: Diagnosis not present

## 2023-11-21 DIAGNOSIS — K130 Diseases of lips: Secondary | ICD-10-CM | POA: Insufficient documentation

## 2023-11-21 DIAGNOSIS — S0992XA Unspecified injury of nose, initial encounter: Secondary | ICD-10-CM | POA: Diagnosis present

## 2023-11-21 DIAGNOSIS — Z79899 Other long term (current) drug therapy: Secondary | ICD-10-CM | POA: Insufficient documentation

## 2023-11-21 DIAGNOSIS — E119 Type 2 diabetes mellitus without complications: Secondary | ICD-10-CM | POA: Diagnosis not present

## 2023-11-21 DIAGNOSIS — I10 Essential (primary) hypertension: Secondary | ICD-10-CM | POA: Diagnosis not present

## 2023-11-21 DIAGNOSIS — T148XXA Other injury of unspecified body region, initial encounter: Secondary | ICD-10-CM

## 2023-11-21 LAB — COMPREHENSIVE METABOLIC PANEL WITH GFR
ALT: 11 U/L (ref 0–44)
AST: 16 U/L (ref 15–41)
Albumin: 3.5 g/dL (ref 3.5–5.0)
Alkaline Phosphatase: 84 U/L (ref 38–126)
Anion gap: 6 (ref 5–15)
BUN: 12 mg/dL (ref 6–20)
CO2: 24 mmol/L (ref 22–32)
Calcium: 8.4 mg/dL — ABNORMAL LOW (ref 8.9–10.3)
Chloride: 112 mmol/L — ABNORMAL HIGH (ref 98–111)
Creatinine, Ser: 0.82 mg/dL (ref 0.44–1.00)
GFR, Estimated: >60 mL/min (ref >60)
Glucose, Bld: 144 mg/dL — ABNORMAL HIGH (ref 70–99)
Potassium: 3.1 mmol/L — ABNORMAL LOW (ref 3.5–5.1)
Sodium: 142 mmol/L (ref 135–145)
Total Bilirubin: <0.2 mg/dL (ref 0.0–1.2)
Total Protein: 6.4 g/dL — ABNORMAL LOW (ref 6.5–8.1)

## 2023-11-21 LAB — CBC
HCT: 36.6 % (ref 36.0–46.0)
Hemoglobin: 11.9 g/dL — ABNORMAL LOW (ref 12.0–15.0)
MCH: 29.5 pg (ref 26.0–34.0)
MCHC: 32.5 g/dL (ref 30.0–36.0)
MCV: 90.8 fL (ref 80.0–100.0)
Platelets: 223 10*3/uL (ref 150–400)
RBC: 4.03 MIL/uL (ref 3.87–5.11)
RDW: 13 % (ref 11.5–15.5)
WBC: 8.2 10*3/uL (ref 4.0–10.5)
nRBC: 0 % (ref 0.0–0.2)

## 2023-11-21 LAB — ETHANOL: Alcohol, Ethyl (B): 69 mg/dL — ABNORMAL HIGH (ref <15)

## 2023-11-21 MED ORDER — POTASSIUM CHLORIDE CRYS ER 20 MEQ PO TBCR
40.0000 meq | EXTENDED_RELEASE_TABLET | Freq: Once | ORAL | Status: AC
  Administered 2023-11-21: 40 meq via ORAL
  Filled 2023-11-21: qty 2

## 2023-11-21 MED ORDER — SODIUM CHLORIDE 0.9 % IV BOLUS
1000.0000 mL | Freq: Once | INTRAVENOUS | Status: AC
  Administered 2023-11-21: 1000 mL via INTRAVENOUS

## 2023-11-21 NOTE — ED Notes (Signed)
 Pt ambulatory with steady gait to the bathroom - pt requesting food/ drink.
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
## 2023-11-21 NOTE — Discharge Instructions (Addendum)
Return to the ER for worsening symptoms, persistent vomiting, lethargy or other concerns.
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
## 2023-11-27 ENCOUNTER — Other Ambulatory Visit: Payer: Self-pay | Admitting: Radiology

## 2023-11-29 ENCOUNTER — Ambulatory Visit
Admission: RE | Admit: 2023-11-29 | Discharge: 2023-11-29 | Disposition: A | Discharge status: Home / Self Care | Source: Ambulatory Visit | Attending: Podiatry | Admitting: Podiatry

## 2023-11-29 NOTE — Congregational Nurse Program (Signed)
 05/- Dept: [phone]   Congregational Nurse Program Note  Date of Encounter: 11/28/2023 Late Entry: Client to [facility] day center, nurse led clinic today for assistance in cancelling her upcoming IR appointment. Client was vague about her current situation and was unclear as to when she could reschedule the appointment. Message left with IR scheduler at [phone] to cancel removal of her G-tube. Rn provided emotional support and encouraged client to return to the center for further assistance. Juvenal Opoka BSN, RN Past Medical History: Past Medical History:  Diagnosis Date   Allergy    Anxiety    Arthritis    Asthma    Bipolar 1 disorder (HCC)    Cancer (HCC)    COPD (chronic obstructive pulmonary disease) (HCC)    Depression    Diabetes mellitus without complication (HCC)    Drug abuse (HCC)    Hypertension    Neuromuscular disorder (HCC)    Osteoporosis    Sleep apnea     Encounter Details:  Community Questionnaire - 11/28/23 0940       Questionnaire   Ask client: Do you give verbal consent for me to treat you today? Yes    Student Assistance N/A    Location Patient Served  [facility]    Encounter Setting CN site    Population Status Unhoused    [place]    Insurance/Financial Assistance Referral N/A    Medication N/A    Medical Provider Yes    Screening Referrals Made N/A    Medical Referrals Made N/A    Medical Appointment Completed N/A    CNP Interventions Advocate/Support;Educate;Navigate Healthcare System    Screenings CN Performed N/A    ED Visit Averted N/A    Life-Saving Intervention Made N/A

## 2023-12-04 ENCOUNTER — Ambulatory Visit: Admitting: Family Medicine

## 2023-12-06 NOTE — Congregational Nurse Program (Signed)
  Dept: [phone]   Congregational Nurse Program Note  Date of Encounter: 12/06/2023 Client to [facility] day center, nurse led clinic, with request for assistance in making an appointment with [facility] IR to have her G-tube removed. She was unable to go to her last appointment for personal reasons. Rn was able to contact the scheduler [phone]) and appointment was made for Tuesday 5/21 at 1 pm. Cleint is to arrive at 12 pm and have nothing to eat or drink 6 hours prior. Instructions reviewed with client. She does not currently have a working phone, scheduler is aware. RN to arrange Medicaid transportation for this appointment. Client reports that her significant other will accompany her to this appointment as she will will be having sedation. RN was able to confirm this with him. Juvenal Opoka BSN, RN Past Medical History: Past Medical History:  Diagnosis Date   Allergy    Anxiety    Arthritis    Asthma    Bipolar 1 disorder (HCC)    Cancer (HCC)    COPD (chronic obstructive pulmonary disease) (HCC)    Depression    Diabetes mellitus without complication (HCC)    Drug abuse (HCC)    Hypertension    Neuromuscular disorder (HCC)    Osteoporosis    Sleep apnea     Encounter Details:  Community Questionnaire - 12/06/23 1030       Questionnaire   Ask client: Do you give verbal consent for me to treat you today? Yes    Student Assistance N/A    Location Patient Served  [facility]    Encounter Setting CN site    Population Status Unhoused    [place]    Insurance/Financial Assistance Referral N/A    Medication N/A    Medical Provider Yes    Screening Referrals Made N/A    Medical Referrals Made N/A    Medical Appointment Completed N/A    CNP Interventions Advocate/Support;Educate;Navigate Healthcare System    Screenings CN Performed N/A    ED Visit Averted N/A    Life-Saving Intervention Made N/A

## 2023-12-07 NOTE — Congregational Nurse Program (Signed)
  Dept: [phone]   Congregational Nurse Program Note  Date of Encounter: 12/07/2023 Client to [facility] day center for assistance with arranging her Medicaid transportation to her IR appointment at [facility] on 5/21. Transportation arranged. Client to be picked up at the center at 11:15, return trip scheduled for 2:30. Her sigfnificant other to accompany her. Confirmation # [id number]. Foot care provided as well. Client also expressed great concern and was very upset about the fact that she had lost her medications. She had been with out several for the last 2 weeks. [facility] was able to arrange a one time emergency medicaid medication assistance. Through [facility], site. Client was given bus passes for she and her significant other to go to social services today. Emotional support provided. Client appreciative of assistance.  Juvenal Opoka BSN, RN Past Medical History: Past Medical History:  Diagnosis Date   Allergy    Anxiety    Arthritis    Asthma    Bipolar 1 disorder (HCC)    Cancer (HCC)    COPD (chronic obstructive pulmonary disease) (HCC)    Depression    Diabetes mellitus without complication (HCC)    Drug abuse (HCC)    Hypertension    Neuromuscular disorder (HCC)    Osteoporosis    Sleep apnea     Encounter Details:  Community Questionnaire - 12/07/23 1304       Questionnaire   Ask client: Do you give verbal consent for me to treat you today? Yes    Student Assistance N/A    Location Patient Served  [facility]    Encounter Setting CN site    Population Status Unhoused    Insurance Medicaid    Insurance/Financial Assistance Referral N/A    Medication Have Medication Insecurities    Medical Provider Yes    Screening Referrals Made N/A    Medical Referrals Made N/A    Medical Appointment Completed N/A    CNP Interventions Advocate/Support;Educate;Navigate Healthcare System;Case Management    Screenings CN Performed N/A    ED  Visit Averted N/A    Life-Saving Intervention Made N/A

## 2023-12-08 ENCOUNTER — Encounter: Payer: Self-pay | Admitting: Family Medicine

## 2023-12-08 ENCOUNTER — Ambulatory Visit (INDEPENDENT_AMBULATORY_CARE_PROVIDER_SITE_OTHER): Admitting: Family Medicine

## 2023-12-08 ENCOUNTER — Other Ambulatory Visit (INDEPENDENT_AMBULATORY_CARE_PROVIDER_SITE_OTHER): Payer: Self-pay

## 2023-12-08 VITALS — BP 158/91 | HR 90 | Temp 98.0°F | Resp 20 | Ht 67.0 in | Wt 183.0 lb

## 2023-12-08 DIAGNOSIS — K219 Gastro-esophageal reflux disease without esophagitis: Secondary | ICD-10-CM | POA: Diagnosis not present

## 2023-12-08 DIAGNOSIS — Z13820 Encounter for screening for osteoporosis: Secondary | ICD-10-CM | POA: Diagnosis not present

## 2023-12-08 DIAGNOSIS — G6289 Other specified polyneuropathies: Secondary | ICD-10-CM

## 2023-12-08 DIAGNOSIS — Z1231 Encounter for screening mammogram for malignant neoplasm of breast: Secondary | ICD-10-CM | POA: Diagnosis not present

## 2023-12-08 DIAGNOSIS — I10 Essential (primary) hypertension: Secondary | ICD-10-CM | POA: Diagnosis not present

## 2023-12-08 DIAGNOSIS — F339 Major depressive disorder, recurrent, unspecified: Secondary | ICD-10-CM

## 2023-12-08 DIAGNOSIS — M62838 Other muscle spasm: Secondary | ICD-10-CM | POA: Diagnosis not present

## 2023-12-08 DIAGNOSIS — B372 Candidiasis of skin and nail: Secondary | ICD-10-CM

## 2023-12-08 DIAGNOSIS — J0100 Acute maxillary sinusitis, unspecified: Secondary | ICD-10-CM

## 2023-12-08 DIAGNOSIS — Z0000 Encounter for general adult medical examination without abnormal findings: Secondary | ICD-10-CM

## 2023-12-08 DIAGNOSIS — E782 Mixed hyperlipidemia: Secondary | ICD-10-CM

## 2023-12-08 DIAGNOSIS — J302 Other seasonal allergic rhinitis: Secondary | ICD-10-CM | POA: Diagnosis not present

## 2023-12-08 DIAGNOSIS — J029 Acute pharyngitis, unspecified: Secondary | ICD-10-CM

## 2023-12-08 LAB — POCT RAPID STREP A (OFFICE): Rapid Strep A Screen: NEGATIVE

## 2023-12-08 MED ORDER — MONTELUKAST SODIUM 10 MG PO TABS: 10.0000 mg | ORAL_TABLET | Freq: Every day | ORAL | 5 refills | Status: DC

## 2023-12-08 MED ORDER — AMLODIPINE BESYLATE 5 MG PO TABS: 5.0000 mg | ORAL_TABLET | Freq: Every day | ORAL | 2 refills | Status: DC

## 2023-12-08 MED ORDER — ATORVASTATIN CALCIUM 40 MG PO TABS: 40.0000 mg | ORAL_TABLET | Freq: Every day | ORAL | 3 refills | Status: DC

## 2023-12-08 MED ORDER — NYSTATIN 100000 UNIT/GM EX POWD: 1.0000 | Freq: Three times a day (TID) | CUTANEOUS | 1 refills | Status: DC

## 2023-12-08 MED ORDER — DOXYCYCLINE HYCLATE 100 MG PO CAPS
100.0000 mg | ORAL_CAPSULE | Freq: Two times a day (BID) | ORAL | 0 refills | Status: DC
Start: 2023-12-08 — End: 2024-01-29

## 2023-12-08 MED ORDER — LOSARTAN POTASSIUM 25 MG PO TABS: 25.0000 mg | ORAL_TABLET | Freq: Every day | ORAL | 5 refills | Status: DC

## 2023-12-08 MED ORDER — QUETIAPINE FUMARATE 400 MG PO TABS: 400.0000 mg | ORAL_TABLET | Freq: Every day | ORAL | 2 refills | Status: DC

## 2023-12-08 MED ORDER — GABAPENTIN 800 MG PO TABS
800.0000 mg | ORAL_TABLET | Freq: Three times a day (TID) | ORAL | 3 refills | Status: DC
Start: 2023-12-08 — End: 2024-02-14

## 2023-12-08 MED ORDER — OMEPRAZOLE 40 MG PO CPDR
40.0000 mg | DELAYED_RELEASE_CAPSULE | Freq: Every day | ORAL | 3 refills | Status: DC
Start: 2023-12-08 — End: 2024-01-18

## 2023-12-08 MED ORDER — BACLOFEN 10 MG PO TABS: 10.0000 mg | ORAL_TABLET | Freq: Three times a day (TID) | ORAL | 1 refills | Status: DC

## 2023-12-08 MED ORDER — VENLAFAXINE HCL ER 150 MG PO CP24: 150.0000 mg | ORAL_CAPSULE | Freq: Every morning | ORAL | 2 refills | Status: DC

## 2023-12-08 NOTE — Progress Notes (Signed)
 Established Patient Office Visit  Subjective   Patient ID: Andrea Sanford, female    DOB: [birth year]  Age: 53 y.o. MRN: [id number]  Chief Complaint  Patient presents with   Medical Management of Chronic Issues    HPI [name] with HTN, DMT2 (09/14/2023 A1c 6.8%, diet-controlled), GAD/bipolar disorder/polysubstance abuse (under the care of psychiatry), Hx acute hypoxic respiratory failure requiring endotracheal intubation and G-tube placement (polysubstance overdose) still with G-tube. She has lost all of her medications.  She is no longer living with her mother and is homeless now.  She is working with the charity that will give her a voucher so she can get her meds filled. She has missed her mammogram appointment.  Discussed getting a DEXA and we will reschedule her mammogram.   She has a sore throat and a cough that she has had for about 3 days.  The cough is productive and she is smoking.  She denies wheezing or shortness of breath with ambulation.   She takes losartan  25 mg and amlodipine  5 mg daily for her blood pressure.  She is on atorvastatin  40 mg daily. She does not have an appointment with psychiatry for 3 weeks so she needs her Seroquel  400 mg and Effexor  XR 150 mg filled today.  She also has a clonazepam  1 mg twice daily but have advised this cannot be refilled early.  She will need to get her clonazepam  from her psychiatrist.        ROS    Objective:     BP (!) 158/91 (BP Location: Left Arm, Patient Position: Sitting, Cuff Size: Normal)   Pulse 90   Temp 98 F (36.7 C) (Oral)   Resp 20   Ht 5\' 7"  (1.702 m)   Wt 183 lb (83 kg)   SpO2 97%   BMI 28.66 kg/m    Physical Exam Vitals and nursing note reviewed.  Constitutional:      Appearance: Normal appearance.  HENT:     Head: Normocephalic and atraumatic.     Mouth/Throat:     Lips: Pink.     Mouth: Mucous membranes are moist.     Pharynx: Oropharynx is clear. Uvula midline. Posterior  oropharyngeal erythema present.     Comments: Tonsils injected. Eyes:     Conjunctiva/sclera: Conjunctivae normal.  Cardiovascular:     Rate and Rhythm: Normal rate and regular rhythm.  Pulmonary:     Effort: Pulmonary effort is normal.     Breath sounds: Normal breath sounds.  Musculoskeletal:     Right lower leg: No edema.     Left lower leg: No edema.  Skin:    General: Skin is warm and dry.  Neurological:     Mental Status: She is alert and oriented to person, place, and time.  Psychiatric:        Mood and Affect: Mood normal.        Behavior: Behavior normal.        Thought Content: Thought content normal.        Judgment: Judgment normal.          Results for orders placed or performed in visit on 12/08/23  POCT rapid strep A  Result Value Ref Range   Rapid Strep A Screen Negative Negative      The 10-year ASCVD risk score (Arnett DK, et al., 2019) is: 11.8%    Assessment & Plan:  Depression, recurrent (HCC) Assessment & Plan: Dr. Manus Sellers of psychiatry treats her  depression, bipolar disorder, GAD and polysubstance abuse.  Writing her prescriptions only today because she lost all her medication.  Will not prescribe her clonazepam  and advised it cannot be filled early because it is controlled.    Orders: -     QUEtiapine  Fumarate; Take 1 tablet (400 mg total) by mouth at bedtime.  Dispense: 30 tablet; Refill: 2 -     Venlafaxine  HCl ER; Take 1 capsule (150 mg total) by mouth every morning.  Dispense: 30 capsule; Refill: 2  Primary hypertension Assessment & Plan: Refilling her losartan  and amlodipine .  Blood pressure is not controlled and she has not had her medication today.  Orders: -     Losartan  Potassium; Take 1 tablet (25 mg total) by mouth daily.  Dispense: 30 tablet; Refill: 5 -     amLODIPine  Besylate; Take 1 tablet (5 mg total) by mouth daily.  Dispense: 30 tablet; Refill: 2  Mixed hyperlipidemia Assessment & Plan: 10/03/2023 total cholesterol 213,  Triggs 152, HDL 49 and LDL 137.  She has DMT2 so her goal is LDL 70 or less.  Atorvastatin  40 mg daily.  Orders: -     Atorvastatin  Calcium ; Take 1 tablet (40 mg total) by mouth daily.  Dispense: 30 tablet; Refill: 3  Neck muscle spasm -     Baclofen ; Take 1 tablet (10 mg total) by mouth 3 (three) times daily.  Dispense: 30 each; Refill: 1  Other polyneuropathy -     Gabapentin ; Take 1 tablet (800 mg total) by mouth 3 (three) times daily.  Dispense: 90 tablet; Refill: 3  Seasonal allergic rhinitis, unspecified trigger -     Montelukast  Sodium; Take 1 tablet (10 mg total) by mouth at bedtime.  Dispense: 30 tablet; Refill: 5  Gastroesophageal reflux disease, unspecified whether esophagitis present -     Omeprazole ; Take 1 capsule (40 mg total) by mouth daily.  Dispense: 30 capsule; Refill: 3  Candidal dermatitis -     Nystatin ; Apply 1 Application topically 3 (three) times daily.  Dispense: 15 g; Refill: 1  Acute non-recurrent maxillary sinusitis -     Doxycycline  Hyclate; Take 1 capsule (100 mg total) by mouth 2 (two) times daily.  Dispense: 20 capsule; Refill: 0  Encounter for screening mammogram for malignant neoplasm of breast -     3D Screening Mammogram, Left and Right; Future  Screening for osteoporosis -     DG Bone Density; Future  Healthcare maintenance Assessment & Plan: Will provide another referral for mammogram.  Would like to get her DEXA scan while she is there.      Return in about 4 weeks (around 01/05/2024).    Elisheba Mcdonnell K Ahmon Tosi, MD

## 2023-12-12 NOTE — Progress Notes (Signed)
 Patient for IR G-Tube removal on Wed 12/13/23, I called and spoke with the patient's Rep, Mariah Shines on the phone and gave pre-procedure instructions. Mariah Shines was made aware to have the patient here at 12p, NPO after MN prior to procedure as well as driver post procedure/recovery/discharge. Mariah Shines stated understanding.  Called 12/06/23

## 2023-12-13 ENCOUNTER — Ambulatory Visit
Admission: RE | Admit: 2023-12-13 | Discharge: 2023-12-13 | Disposition: A | Discharge status: Home / Self Care | Source: Ambulatory Visit | Attending: Podiatry | Admitting: Podiatry

## 2023-12-13 DIAGNOSIS — Z431 Encounter for attention to gastrostomy: Secondary | ICD-10-CM | POA: Insufficient documentation

## 2023-12-13 DIAGNOSIS — Z931 Gastrostomy status: Secondary | ICD-10-CM

## 2023-12-13 HISTORY — PX: IR GASTROSTOMY TUBE REMOVAL: IMG5492

## 2023-12-13 MED ORDER — MIDAZOLAM HCL 2 MG/ML PO SYRP
ORAL_SOLUTION | ORAL | Status: AC
  Filled 2023-12-13: qty 5

## 2023-12-13 MED ORDER — LIDOCAINE VISCOUS HCL 2 % MT SOLN
15.0000 mL | Freq: Once | OROMUCOSAL | Status: AC
  Administered 2023-12-13: 15 mL via TOPICAL

## 2023-12-13 MED ORDER — LIDOCAINE VISCOUS HCL 2 % MT SOLN
OROMUCOSAL | Status: AC
  Filled 2023-12-13: qty 15

## 2023-12-13 MED ORDER — MIDAZOLAM HCL 2 MG/ML PO SYRP
8.0000 mg | ORAL_SOLUTION | Freq: Once | ORAL | Status: AC
  Administered 2023-12-13: 8 mg via ORAL

## 2023-12-13 NOTE — Progress Notes (Signed)
 Patient taken via wheelchair to [facility] lobby for transport to home. Friend is no longer in lobby. Patient wishes to stay in wheelchair and wait for him. Declined offer to return to specials and also declined offer to call her transport.

## 2023-12-18 DIAGNOSIS — F339 Major depressive disorder, recurrent, unspecified: Secondary | ICD-10-CM | POA: Insufficient documentation

## 2023-12-18 DIAGNOSIS — J329 Chronic sinusitis, unspecified: Secondary | ICD-10-CM | POA: Insufficient documentation

## 2023-12-18 NOTE — Assessment & Plan Note (Addendum)
 Dr. Manus Sellers of psychiatry treats her depression, bipolar disorder, GAD and polysubstance abuse.  Writing her prescriptions only today because she lost all her medication.  Will not prescribe her clonazepam  and advised it cannot be filled early because it is controlled.

## 2023-12-18 NOTE — Assessment & Plan Note (Signed)
 Refilling her losartan  and amlodipine .  Blood pressure is not controlled and she has not had her medication today.

## 2023-12-18 NOTE — Assessment & Plan Note (Signed)
 Has sinusitis and pharyngitis.  Doxycycline  100 mg twice daily for 10 days.  She is a smoker

## 2023-12-18 NOTE — Assessment & Plan Note (Signed)
 Will provide another referral for mammogram.  Would like to get her DEXA scan while she is there.

## 2023-12-18 NOTE — Assessment & Plan Note (Signed)
 10/03/2023 total cholesterol 213, Triggs 152, HDL 49 and LDL 137.  She has DMT2 so her goal is LDL 70 or less.  Atorvastatin  40 mg daily.

## 2023-12-25 DIAGNOSIS — F319 Bipolar disorder, unspecified: Secondary | ICD-10-CM | POA: Diagnosis not present

## 2024-01-08 ENCOUNTER — Ambulatory Visit: Admitting: Family Medicine

## 2024-01-10 NOTE — Congregational Nurse Program (Signed)
  Dept: [phone]   Congregational Nurse Program Note  Date of Encounter: 01/10/2024 Client to [facility] day center, nurse led clinic, with request for assistance in arranging Medicaid transportation to her upcoming PCP apt on 6/23. Transportation arranged. Reference number [id number] with [facility]. Client to be picked up at [address]. Between 12:45 and 1:15. Client is aware. Juvenal Opoka BSN, RN Past Medical History: Past Medical History:  Diagnosis Date   Allergy    Anxiety    Arthritis    Asthma    Bipolar 1 disorder (HCC)    Cancer (HCC)    COPD (chronic obstructive pulmonary disease) (HCC)    Depression    Diabetes mellitus without complication (HCC)    Drug abuse (HCC)    Hypertension    Neuromuscular disorder (HCC)    Osteoporosis    Sleep apnea     Encounter Details:  Community Questionnaire - 01/10/24 1130       Questionnaire   Ask client: Do you give verbal consent for me to treat you today? Yes    Student Assistance N/A    Location Patient Served  [facility]    Encounter Setting CN site    Population Status Unhoused    Insurance Medicaid    Insurance/Financial Assistance Referral N/A    Medication Have Medication Insecurities    Medical Provider Yes    Screening Referrals Made N/A    Medical Referrals Made N/A    Medical Appointment Completed N/A    CNP Interventions Advocate/Support;Educate;Navigate Healthcare System;Case Management    Screenings CN Performed N/A    ED Visit Averted N/A    Life-Saving Intervention Made N/A

## 2024-01-15 ENCOUNTER — Ambulatory Visit: Admitting: Family Medicine

## 2024-01-18 ENCOUNTER — Encounter: Payer: Self-pay | Admitting: Family Medicine

## 2024-01-18 ENCOUNTER — Ambulatory Visit: Admitting: Family Medicine

## 2024-01-18 ENCOUNTER — Ambulatory Visit (INDEPENDENT_AMBULATORY_CARE_PROVIDER_SITE_OTHER): Admitting: Family Medicine

## 2024-01-18 VITALS — BP 143/101 | HR 69 | Temp 97.8°F | Resp 20 | Ht 66.0 in | Wt 178.0 lb

## 2024-01-18 DIAGNOSIS — M2550 Pain in unspecified joint: Secondary | ICD-10-CM | POA: Diagnosis not present

## 2024-01-18 DIAGNOSIS — B372 Candidiasis of skin and nail: Secondary | ICD-10-CM

## 2024-01-18 DIAGNOSIS — M62838 Other muscle spasm: Secondary | ICD-10-CM

## 2024-01-18 DIAGNOSIS — K219 Gastro-esophageal reflux disease without esophagitis: Secondary | ICD-10-CM | POA: Diagnosis not present

## 2024-01-18 DIAGNOSIS — J441 Chronic obstructive pulmonary disease with (acute) exacerbation: Secondary | ICD-10-CM | POA: Diagnosis not present

## 2024-01-18 MED ORDER — MELOXICAM 15 MG PO TABS
15.0000 mg | ORAL_TABLET | Freq: Every day | ORAL | 2 refills | Status: DC
Start: 2024-01-18 — End: 2024-02-14

## 2024-01-18 MED ORDER — PREDNISONE 10 MG (48) PO TBPK: ORAL_TABLET | Freq: Every day | ORAL | 0 refills | Status: DC

## 2024-01-18 MED ORDER — DOXYCYCLINE HYCLATE 100 MG PO CAPS: 100.0000 mg | ORAL_CAPSULE | Freq: Two times a day (BID) | ORAL | 0 refills | Status: DC

## 2024-01-18 MED ORDER — NYSTATIN 100000 UNIT/GM EX POWD: 1.0000 | Freq: Three times a day (TID) | CUTANEOUS | 1 refills | Status: DC

## 2024-01-18 MED ORDER — OMEPRAZOLE 40 MG PO CPDR: 40.0000 mg | DELAYED_RELEASE_CAPSULE | Freq: Every day | ORAL | 1 refills | Status: DC

## 2024-01-18 MED ORDER — BACLOFEN 10 MG PO TABS
10.0000 mg | ORAL_TABLET | Freq: Three times a day (TID) | ORAL | 1 refills | Status: DC
Start: 2024-01-18 — End: 2024-02-14

## 2024-01-18 NOTE — Progress Notes (Signed)
 Established Patient Office Visit  Subjective   Patient ID: Andrea Sanford, female    DOB: [birth year]  Age: 53 y.o. MRN: [id number]  Chief Complaint  Patient presents with   Medical Management of Chronic Issues    HPI [name] with HTN, DMT2 (09/14/2023 A1c 6.8%, diet-controlled), GAD/bipolar disorder/polysubstance abuse (under the care of psychiatry), Hx acute hypoxic respiratory failure requiring endotracheal intubation and G-tube placement (polysubstance overdose) still with G-tube, Cologuard inadequate and needs repeat.   Discussed the use of AI scribe software for clinical note transcription with the patient, who gave verbal consent to proceed.  History of Present Illness   Andrea Sanford is a 53 year old female who presents with generalized body pain and swelling.  She experiences generalized body pain and swelling, describing the pain as migratory. Previously, the pain was localized to her feet with a burning sensation, but it has since become more widespread. She experienced transient swelling in her knee and a painful muscle knot in her left thigh that resolved spontaneously. She expresses significant distress over the persistent nature of the pain, stating 'I'm tired of living. I'm tired of the hurt.'  She has a history of acid reflux and takes omeprazole . She is concerned about taking anti-inflammatory medications due to her stomach issues. She is currently taking montelukast , which she started two days ago, baclofen  for muscle spasms, and gabapentin , noting that she takes three at a time. She also takes blood pressure medication but did not take it on the morning of the visit.  She reports a persistent cough and recalls a hospitalization in February due to secretion filling her lungs, but she has not been hospitalized since then. No wheezing or shortness of breath.  She discusses challenges with accessing mental health care, noting that she missed an  appointment due to a transportation error and is no longer with [facility]. She wants to avoid group therapy sessions and is seeking individual mental health support.        ROS    Objective:     BP (!) 159/106 (BP Location: Right Arm, Patient Position: Sitting, Cuff Size: Normal)   Pulse 70   Temp 97.8 F (36.6 C) (Oral)   Resp 20   Ht 5' 6 (1.676 m)   Wt 178 lb (80.7 kg)   SpO2 95%   BMI 28.73 kg/m    Physical Exam Vitals and nursing note reviewed.  Constitutional:      Appearance: Normal appearance.  HENT:     Head: Normocephalic and atraumatic.   Eyes:     Conjunctiva/sclera: Conjunctivae normal.    Cardiovascular:     Rate and Rhythm: Normal rate and regular rhythm.  Pulmonary:     Effort: Pulmonary effort is normal.     Breath sounds: Normal breath sounds.   Musculoskeletal:     Right lower leg: No edema.     Left lower leg: No edema.   Skin:    General: Skin is warm and dry.   Neurological:     Mental Status: She is alert and oriented to person, place, and time.   Psychiatric:        Mood and Affect: Mood normal.        Behavior: Behavior normal.        Thought Content: Thought content normal.        Judgment: Judgment normal.          No results found for any visits on  01/18/24.    The 10-year ASCVD risk score (Arnett DK, et al., 2019) is: 20.2%    Assessment & Plan:  Gastroesophageal reflux disease, unspecified whether esophagitis present -     Omeprazole ; Take 1 capsule (40 mg total) by mouth daily.  Dispense: 90 capsule; Refill: 1  Candidal dermatitis -     Nystatin ; Apply 1 Application topically 3 (three) times daily.  Dispense: 270 g; Refill: 1  Assessment and Plan    Chronic Pain Persistent widespread pain affecting daily life. Concerns about stomach tolerance to anti-inflammatory medication due to acid reflux. - Prescribed meloxicam 15mg  once daily for pain management to minimize stomach irritation.  COPD  Exacerbation Persistent cough  - prednisone dosepack 10mg  12 day and doxycycline  100mg  BID   Hypertension Blood pressure 159/106 mmHg. Discussed stroke risk due to uncontrolled hypertension. - Patient is noncompliant with her BP medications.  Instructed to take blood pressure medication as prescribed to prevent stroke.  Acid Reflux Currently taking omeprazole . Concerns about anti-inflammatory medication due to potential stomach issues.  Mental Health Concerns Expressed need for mental health support. Difficulty with previous appointments. Not interested in group therapy. - Advised to contact [facility] for mental health services.  Muscle Spasms Currently taking baclofen  for muscle relaxation. - Refilled baclofen  prescription.         Return in about 3 months (around 04/19/2024).    Mervyn Pflaum K Davon Folta, MD

## 2024-01-22 ENCOUNTER — Emergency Department
Admission: EM | Admit: 2024-01-22 | Discharge: 2024-01-22 | Discharge status: Against Medical Advice | Attending: Emergency Medicine | Admitting: Emergency Medicine

## 2024-01-22 DIAGNOSIS — Z5321 Procedure and treatment not carried out due to patient leaving prior to being seen by health care provider: Secondary | ICD-10-CM | POA: Insufficient documentation

## 2024-01-22 DIAGNOSIS — M79673 Pain in unspecified foot: Secondary | ICD-10-CM | POA: Diagnosis present

## 2024-01-22 NOTE — ED Notes (Signed)
 Pt walked out during triage--see triage note. Triage could not be completed.

## 2024-01-22 NOTE — ED Triage Notes (Signed)
 Pt to ED for chronic generalized pain since years. Pt takes gapabentin and says has been taking extra. Pt cursing and waving arms in triage. Pt called triage tech you fucking bitch while trying to take her temperature and accusing her of looking at her feet the wrong way. Pt then kicked off vital sign equipment, threw it on the floor and walked out before triage complete. Same pt was in lobby earlier waving arms and saying she needed her caregiver to come help her (who was currently also checked in as a patient).

## 2024-01-23 ENCOUNTER — Telehealth: Payer: Self-pay

## 2024-01-23 NOTE — Telephone Encounter (Signed)
 Called patient at [phone] and did no receive an answer nor a VM to leave a message.

## 2024-01-23 NOTE — Telephone Encounter (Signed)
 Copied from CRM [id number]. Topic: Clinical - Medication Question >> Jan 23, 2024  2:39 PM Tobias L wrote: Reason for CRM: Nat a Energy manager with [employer] on behalf on patient. Patient reached out to [facility] advocate line as she is out of medication.   Nat requesting someone reach out to patient to get clarification on which medications patient is needing and to assist pt with getting medications.

## 2024-01-29 ENCOUNTER — Telehealth: Payer: Self-pay

## 2024-01-29 ENCOUNTER — Other Ambulatory Visit: Payer: Self-pay | Admitting: Family Medicine

## 2024-01-29 DIAGNOSIS — F339 Major depressive disorder, recurrent, unspecified: Secondary | ICD-10-CM

## 2024-01-29 MED ORDER — QUETIAPINE FUMARATE 400 MG PO TABS: 400.0000 mg | ORAL_TABLET | Freq: Every day | ORAL | 0 refills | Status: DC

## 2024-01-29 MED ORDER — VENLAFAXINE HCL ER 150 MG PO CP24: 150.0000 mg | ORAL_CAPSULE | Freq: Every morning | ORAL | 0 refills | Status: DC

## 2024-01-29 NOTE — Telephone Encounter (Signed)
 Copied from CRM [id number]. Topic: Clinical - Medication Question >> Jan 29, 2024  2:30 PM Delon DASEN wrote: Reason for CRM: returning call about behavioral health meds- she needs clonazePAM  (KLONOPIN ) 1 MG tablet, QUEtiapine  (SEROQUEL ) 400 MG tablet, venlafaxine  XR (EFFEXOR -XR) 150 MG 24 hr capsule - need them filled until she can get into behavioral health- [phone]

## 2024-01-30 ENCOUNTER — Telehealth: Payer: Self-pay

## 2024-01-30 DIAGNOSIS — F339 Major depressive disorder, recurrent, unspecified: Secondary | ICD-10-CM

## 2024-01-30 DIAGNOSIS — F1490 Cocaine use, unspecified, uncomplicated: Secondary | ICD-10-CM

## 2024-01-30 DIAGNOSIS — T7491XA Unspecified adult maltreatment, confirmed, initial encounter: Secondary | ICD-10-CM

## 2024-01-30 DIAGNOSIS — F314 Bipolar disorder, current episode depressed, severe, without psychotic features: Secondary | ICD-10-CM

## 2024-01-30 NOTE — Telephone Encounter (Signed)
 Copied from CRM [id number]. Topic: Referral - Question >> Jan 30, 2024  9:19 AM Revonda D wrote: Reason for CRM:Pt is requesting a referral for psychiatric associates at [facility]. Pt would like a callback with an update on this request.

## 2024-01-30 NOTE — Telephone Encounter (Signed)
 Left message with patient's mother informing her that referral has been placed.

## 2024-01-30 NOTE — Telephone Encounter (Signed)
 Unable to reach patient nor leave a VM.

## 2024-02-07 ENCOUNTER — Ambulatory Visit: Payer: Self-pay | Admitting: *Deleted

## 2024-02-07 NOTE — Telephone Encounter (Signed)
  Answer Assessment - Initial Assessment Questions 1. RESPIRATORY STATUS: Describe your breathing? (e.g., wheezing, shortness of breath, unable to speak, severe coughing)      Cough, SOB- mild 2. ONSET: When did this breathing problem begin?      4 months- chronic- no improvement 3. PATTERN Does the difficult breathing come and go, or has it been constant since it started?      No difficulty  4. SEVERITY: How bad is your breathing? (e.g., mild, moderate, severe)      No problem per patient 5. RECURRENT SYMPTOM: Have you had difficulty breathing before? If Yes, ask: When was the last time? and What happened that time?      Chronic- patient states it is her fault- she stopped her prednisone  early last treatment.   During triage- patient requests virtual appointment due to transportation- advised in person would be better for her symptoms- Patient states forget it and disconnected the call. Attempted to call patient back and got no answer- constant ring. Call sent to office.  Protocols used: Breathing Difficulty-A-AH   Copied from CRM [id number]. Topic: Clinical - Red Word Triage >> Feb 07, 2024  8:41 AM Rosaria BRAVO wrote: Red Word that prompted transfer to Nurse Triage: Cough, difficulty breathing, shortness of breath    ----------------------------------------------------------------------- From previous Reason for Contact - Scheduling: Patient/patient representative is calling to schedule an appointment. Refer to attachments for appointment information.sob

## 2024-02-11 ENCOUNTER — Emergency Department

## 2024-02-11 ENCOUNTER — Inpatient Hospital Stay
Admission: EM | Admit: 2024-02-11 | Discharge: 2024-02-14 | DRG: 917 | Discharge status: Against Medical Advice | Attending: Student in an Organized Health Care Education/Training Program | Admitting: Student in an Organized Health Care Education/Training Program

## 2024-02-11 DIAGNOSIS — E119 Type 2 diabetes mellitus without complications: Secondary | ICD-10-CM | POA: Diagnosis present

## 2024-02-11 DIAGNOSIS — J9601 Acute respiratory failure with hypoxia: Secondary | ICD-10-CM | POA: Diagnosis not present

## 2024-02-11 DIAGNOSIS — E876 Hypokalemia: Secondary | ICD-10-CM | POA: Diagnosis present

## 2024-02-11 DIAGNOSIS — F419 Anxiety disorder, unspecified: Secondary | ICD-10-CM | POA: Diagnosis present

## 2024-02-11 DIAGNOSIS — G928 Other toxic encephalopathy: Secondary | ICD-10-CM | POA: Diagnosis present

## 2024-02-11 DIAGNOSIS — Z818 Family history of other mental and behavioral disorders: Secondary | ICD-10-CM

## 2024-02-11 DIAGNOSIS — Z809 Family history of malignant neoplasm, unspecified: Secondary | ICD-10-CM | POA: Diagnosis not present

## 2024-02-11 DIAGNOSIS — F17210 Nicotine dependence, cigarettes, uncomplicated: Secondary | ICD-10-CM | POA: Diagnosis present

## 2024-02-11 DIAGNOSIS — G9389 Other specified disorders of brain: Secondary | ICD-10-CM | POA: Diagnosis present

## 2024-02-11 DIAGNOSIS — Z79899 Other long term (current) drug therapy: Secondary | ICD-10-CM

## 2024-02-11 DIAGNOSIS — Z791 Long term (current) use of non-steroidal anti-inflammatories (NSAID): Secondary | ICD-10-CM

## 2024-02-11 DIAGNOSIS — J690 Pneumonitis due to inhalation of food and vomit: Secondary | ICD-10-CM | POA: Diagnosis present

## 2024-02-11 DIAGNOSIS — I952 Hypotension due to drugs: Secondary | ICD-10-CM | POA: Diagnosis present

## 2024-02-11 DIAGNOSIS — M810 Age-related osteoporosis without current pathological fracture: Secondary | ICD-10-CM | POA: Diagnosis present

## 2024-02-11 DIAGNOSIS — T4275XA Adverse effect of unspecified antiepileptic and sedative-hypnotic drugs, initial encounter: Secondary | ICD-10-CM | POA: Diagnosis present

## 2024-02-11 DIAGNOSIS — G709 Myoneural disorder, unspecified: Secondary | ICD-10-CM | POA: Diagnosis present

## 2024-02-11 DIAGNOSIS — R680 Hypothermia, not associated with low environmental temperature: Secondary | ICD-10-CM | POA: Diagnosis present

## 2024-02-11 DIAGNOSIS — Z833 Family history of diabetes mellitus: Secondary | ICD-10-CM

## 2024-02-11 DIAGNOSIS — T50901A Poisoning by unspecified drugs, medicaments and biological substances, accidental (unintentional), initial encounter: Principal | ICD-10-CM | POA: Diagnosis present

## 2024-02-11 DIAGNOSIS — F319 Bipolar disorder, unspecified: Secondary | ICD-10-CM | POA: Diagnosis present

## 2024-02-11 DIAGNOSIS — F10129 Alcohol abuse with intoxication, unspecified: Secondary | ICD-10-CM | POA: Diagnosis present

## 2024-02-11 DIAGNOSIS — G4733 Obstructive sleep apnea (adult) (pediatric): Secondary | ICD-10-CM | POA: Diagnosis present

## 2024-02-11 DIAGNOSIS — R4182 Altered mental status, unspecified: Secondary | ICD-10-CM | POA: Diagnosis not present

## 2024-02-11 DIAGNOSIS — J4489 Other specified chronic obstructive pulmonary disease: Secondary | ICD-10-CM | POA: Diagnosis present

## 2024-02-11 DIAGNOSIS — J9602 Acute respiratory failure with hypercapnia: Secondary | ICD-10-CM | POA: Diagnosis present

## 2024-02-11 DIAGNOSIS — R159 Full incontinence of feces: Secondary | ICD-10-CM | POA: Diagnosis not present

## 2024-02-11 DIAGNOSIS — F1410 Cocaine abuse, uncomplicated: Secondary | ICD-10-CM | POA: Diagnosis present

## 2024-02-11 DIAGNOSIS — I10 Essential (primary) hypertension: Secondary | ICD-10-CM | POA: Diagnosis present

## 2024-02-11 DIAGNOSIS — Z5320 Procedure and treatment not carried out because of patient's decision for unspecified reasons: Secondary | ICD-10-CM | POA: Diagnosis present

## 2024-02-11 DIAGNOSIS — G9340 Encephalopathy, unspecified: Principal | ICD-10-CM | POA: Diagnosis present

## 2024-02-11 DIAGNOSIS — R0689 Other abnormalities of breathing: Secondary | ICD-10-CM | POA: Diagnosis not present

## 2024-02-11 DIAGNOSIS — J189 Pneumonia, unspecified organism: Secondary | ICD-10-CM | POA: Diagnosis not present

## 2024-02-11 LAB — BLOOD GAS, ARTERIAL
Acid-base deficit: 0.7 mmol/L (ref 0.0–2.0)
Bicarbonate: 24.2 mmol/L (ref 20.0–28.0)
FIO2: 0.24 %
MECHVT: 500 mL
O2 Saturation: 97.7 %
PEEP: 5 cmH2O
Patient temperature: 37
RATE: 18 {breaths}/min
pCO2 arterial: 40 mmHg (ref 32–48)
pH, Arterial: 7.39 (ref 7.35–7.45)
pO2, Arterial: 86 mmHg (ref 83–108)

## 2024-02-11 LAB — URINALYSIS, ROUTINE W REFLEX MICROSCOPIC
Bilirubin Urine: NEGATIVE
Glucose, UA: NEGATIVE mg/dL
Hgb urine dipstick: NEGATIVE
Ketones, ur: 5 mg/dL — AB
Leukocytes,Ua: NEGATIVE
Nitrite: NEGATIVE
Protein, ur: NEGATIVE mg/dL
Specific Gravity, Urine: 1.009 (ref 1.005–1.030)
pH: 5 (ref 5.0–8.0)

## 2024-02-11 LAB — CBC WITH DIFFERENTIAL/PLATELET
Abs Immature Granulocytes: 0.04 K/uL (ref 0.00–0.07)
Basophils Absolute: 0.1 K/uL (ref 0.0–0.1)
Basophils Relative: 1 %
Eosinophils Absolute: 0.1 K/uL (ref 0.0–0.5)
Eosinophils Relative: 1 %
HCT: 48.2 % — ABNORMAL HIGH (ref 36.0–46.0)
Hemoglobin: 16 g/dL — ABNORMAL HIGH (ref 12.0–15.0)
Immature Granulocytes: 0 %
Lymphocytes Relative: 19 %
Lymphs Abs: 2.1 K/uL (ref 0.7–4.0)
MCH: 29.7 pg (ref 26.0–34.0)
MCHC: 33.2 g/dL (ref 30.0–36.0)
MCV: 89.6 fL (ref 80.0–100.0)
Monocytes Absolute: 0.7 K/uL (ref 0.1–1.0)
Monocytes Relative: 6 %
Neutro Abs: 8.2 K/uL — ABNORMAL HIGH (ref 1.7–7.7)
Neutrophils Relative %: 73 %
Platelets: 224 K/uL (ref 150–400)
RBC: 5.38 MIL/uL — ABNORMAL HIGH (ref 3.87–5.11)
RDW: 13.2 % (ref 11.5–15.5)
WBC: 11.1 K/uL — ABNORMAL HIGH (ref 4.0–10.5)
nRBC: 0 % (ref 0.0–0.2)

## 2024-02-11 LAB — COMPREHENSIVE METABOLIC PANEL WITH GFR
ALT: 14 U/L (ref 0–44)
AST: 23 U/L (ref 15–41)
Albumin: 4.2 g/dL (ref 3.5–5.0)
Alkaline Phosphatase: 92 U/L (ref 38–126)
Anion gap: 12 (ref 5–15)
BUN: 22 mg/dL — ABNORMAL HIGH (ref 6–20)
CO2: 24 mmol/L (ref 22–32)
Calcium: 9.9 mg/dL (ref 8.9–10.3)
Chloride: 104 mmol/L (ref 98–111)
Creatinine, Ser: 0.86 mg/dL (ref 0.44–1.00)
GFR, Estimated: >60 mL/min (ref >60)
Glucose, Bld: 154 mg/dL — ABNORMAL HIGH (ref 70–99)
Potassium: 3.8 mmol/L (ref 3.5–5.1)
Sodium: 140 mmol/L (ref 135–145)
Total Bilirubin: 1.1 mg/dL (ref 0.0–1.2)
Total Protein: 7.8 g/dL (ref 6.5–8.1)

## 2024-02-11 LAB — GLUCOSE, CAPILLARY
Glucose-Capillary: 161 mg/dL — ABNORMAL HIGH (ref 70–99)
Glucose-Capillary: 144 mg/dL — ABNORMAL HIGH (ref 70–99)

## 2024-02-11 LAB — ACETAMINOPHEN LEVEL: Acetaminophen (Tylenol), Serum: <10 ug/mL — ABNORMAL LOW (ref 10–30)

## 2024-02-11 LAB — URINE DRUG SCREEN, QUALITATIVE (ARMC ONLY)
Amphetamines, Ur Screen: NOT DETECTED
Barbiturates, Ur Screen: NOT DETECTED
Benzodiazepine, Ur Scrn: NOT DETECTED
Cannabinoid 50 Ng, Ur ~~LOC~~: POSITIVE — AB
Cocaine Metabolite,Ur ~~LOC~~: POSITIVE — AB
MDMA (Ecstasy)Ur Screen: NOT DETECTED
Methadone Scn, Ur: NOT DETECTED
Opiate, Ur Screen: NOT DETECTED
Phencyclidine (PCP) Ur S: NOT DETECTED
Tricyclic, Ur Screen: NOT DETECTED

## 2024-02-11 LAB — PROTIME-INR
INR: 1 (ref 0.8–1.2)
Prothrombin Time: 14.1 s (ref 11.4–15.2)

## 2024-02-11 LAB — MRSA NEXT GEN BY PCR, NASAL: MRSA by PCR Next Gen: NOT DETECTED

## 2024-02-11 LAB — TYPE AND SCREEN
ABO/RH(D): O POS
Antibody Screen: NEGATIVE

## 2024-02-11 LAB — RESP PANEL BY RT-PCR (RSV, FLU A&B, COVID)  RVPGX2
Influenza A by PCR: NEGATIVE
Influenza B by PCR: NEGATIVE
Resp Syncytial Virus by PCR: NEGATIVE
SARS Coronavirus 2 by RT PCR: NEGATIVE

## 2024-02-11 LAB — TROPONIN I (HIGH SENSITIVITY)
Troponin I (High Sensitivity): 4 ng/L (ref <18)
Troponin I (High Sensitivity): 5 ng/L (ref <18)

## 2024-02-11 LAB — SALICYLATE LEVEL
Salicylate Lvl: <7 mg/dL — ABNORMAL LOW (ref 7.0–30.0)
Salicylate Lvl: <7 mg/dL — ABNORMAL LOW (ref 7.0–30.0)

## 2024-02-11 LAB — ETHANOL: Alcohol, Ethyl (B): <15 mg/dL (ref <15)

## 2024-02-11 MED ORDER — LACTATED RINGERS IV BOLUS
1000.0000 mL | Freq: Once | INTRAVENOUS | Status: AC
  Administered 2024-02-11: 1000 mL via INTRAVENOUS

## 2024-02-11 MED ORDER — THIAMINE MONONITRATE 100 MG PO TABS: 100.0000 mg | ORAL_TABLET | Freq: Every day | ORAL | Status: DC

## 2024-02-11 MED ORDER — NALOXONE HCL 2 MG/2ML IJ SOSY
PREFILLED_SYRINGE | INTRAMUSCULAR | Status: AC
  Filled 2024-02-11: qty 2

## 2024-02-11 MED ORDER — ROCURONIUM BROMIDE 10 MG/ML (PF) SYRINGE: 80.0000 mg | PREFILLED_SYRINGE | Freq: Once | INTRAVENOUS | Status: AC

## 2024-02-11 MED ORDER — INSULIN ASPART 100 UNIT/ML IJ SOLN
0.0000 [IU] | INTRAMUSCULAR | Status: DC
  Administered 2024-02-11: 2 [IU] via SUBCUTANEOUS
  Administered 2024-02-11: 3 [IU] via SUBCUTANEOUS
  Administered 2024-02-12 (×5): 2 [IU] via SUBCUTANEOUS
  Administered 2024-02-13 (×2): 3 [IU] via SUBCUTANEOUS
  Administered 2024-02-13: 2 [IU] via SUBCUTANEOUS
  Filled 2024-02-11 (×9): qty 1

## 2024-02-11 MED ORDER — THIAMINE HCL 100 MG/ML IJ SOLN
500.0000 mg | Freq: Every day | INTRAVENOUS | Status: AC
  Administered 2024-02-11 – 2024-02-14 (×4): 500 mg via INTRAVENOUS
  Filled 2024-02-11 (×4): qty 5

## 2024-02-11 MED ORDER — POLYETHYLENE GLYCOL 3350 17 G PO PACK
17.0000 g | PACK | Freq: Every day | ORAL | Status: DC
  Administered 2024-02-12 – 2024-02-13 (×2): 17 g
  Filled 2024-02-11 (×2): qty 1

## 2024-02-11 MED ORDER — PROPOFOL 1000 MG/100ML IV EMUL
0.0000 ug/kg/min | INTRAVENOUS | Status: DC
  Administered 2024-02-11: 25 ug/kg/min via INTRAVENOUS
  Administered 2024-02-11: 20 ug/kg/min via INTRAVENOUS
  Administered 2024-02-12: 25 ug/kg/min via INTRAVENOUS
  Filled 2024-02-11 (×2): qty 100

## 2024-02-11 MED ORDER — NOREPINEPHRINE 4 MG/250ML-% IV SOLN: 0.0000 ug/min | INTRAVENOUS | Status: DC

## 2024-02-11 MED ORDER — KETAMINE HCL 10 MG/ML IJ SOLN
INTRAMUSCULAR | Status: AC
  Filled 2024-02-11: qty 1

## 2024-02-11 MED ORDER — NOREPINEPHRINE 4 MG/250ML-% IV SOLN
INTRAVENOUS | Status: AC
  Administered 2024-02-11: 10 ug/min via INTRAVENOUS
  Filled 2024-02-11: qty 250

## 2024-02-11 MED ORDER — ROCURONIUM BROMIDE 10 MG/ML (PF) SYRINGE
PREFILLED_SYRINGE | INTRAVENOUS | Status: AC
  Administered 2024-02-11: 80 mg via INTRAVENOUS
  Filled 2024-02-11: qty 10

## 2024-02-11 MED ORDER — FENTANYL CITRATE PF 50 MCG/ML IJ SOSY
50.0000 ug | PREFILLED_SYRINGE | INTRAMUSCULAR | Status: DC | PRN
  Administered 2024-02-12: 50 ug via INTRAVENOUS
  Filled 2024-02-11 (×2): qty 1

## 2024-02-11 MED ORDER — DOCUSATE SODIUM 50 MG/5ML PO LIQD
100.0000 mg | Freq: Two times a day (BID) | ORAL | Status: DC
  Administered 2024-02-11 – 2024-02-13 (×4): 100 mg
  Filled 2024-02-11 (×4): qty 10

## 2024-02-11 MED ORDER — ENOXAPARIN SODIUM 40 MG/0.4ML IJ SOSY
40.0000 mg | PREFILLED_SYRINGE | INTRAMUSCULAR | Status: DC
  Administered 2024-02-11 – 2024-02-13 (×3): 40 mg via SUBCUTANEOUS
  Filled 2024-02-11 (×3): qty 0.4

## 2024-02-11 MED ORDER — POLYETHYLENE GLYCOL 3350 17 G PO PACK: 17.0000 g | PACK | Freq: Every day | ORAL | Status: DC | PRN

## 2024-02-11 MED ORDER — SODIUM CHLORIDE 0.9 % IV BOLUS
1000.0000 mL | Freq: Once | INTRAVENOUS | Status: AC
  Administered 2024-02-11: 1000 mL via INTRAVENOUS

## 2024-02-11 MED ORDER — DOCUSATE SODIUM 100 MG PO CAPS: 100.0000 mg | ORAL_CAPSULE | Freq: Two times a day (BID) | ORAL | Status: DC | PRN

## 2024-02-11 MED ORDER — FENTANYL CITRATE PF 50 MCG/ML IJ SOSY
50.0000 ug | PREFILLED_SYRINGE | INTRAMUSCULAR | Status: DC | PRN
  Administered 2024-02-11: 100 ug via INTRAVENOUS
  Filled 2024-02-11: qty 2

## 2024-02-11 MED ORDER — FAMOTIDINE 20 MG PO TABS
20.0000 mg | ORAL_TABLET | Freq: Two times a day (BID) | ORAL | Status: DC
  Administered 2024-02-12 – 2024-02-13 (×3): 20 mg
  Filled 2024-02-11 (×3): qty 1

## 2024-02-11 MED ORDER — NALOXONE HCL 2 MG/2ML IJ SOSY
2.0000 mg | PREFILLED_SYRINGE | Freq: Once | INTRAMUSCULAR | Status: AC
  Administered 2024-02-11: 2 mg via INTRAVENOUS

## 2024-02-11 MED ORDER — KETAMINE HCL 50 MG/5ML IJ SOSY
80.0000 mg | PREFILLED_SYRINGE | Freq: Once | INTRAMUSCULAR | Status: AC
  Administered 2024-02-11: 80 mg via INTRAVENOUS

## 2024-02-11 MED ORDER — PROPOFOL 1000 MG/100ML IV EMUL
INTRAVENOUS | Status: AC
  Administered 2024-02-11: 20 mg
  Filled 2024-02-11: qty 100

## 2024-02-11 NOTE — ED Triage Notes (Signed)
 EMS reports they were called out to [facility] for patient being unresponsive; patient not responsive to any verbal or painful stimuli. Patient covered in feces. Last seen normal at 0400.  BG 153 HR 80 NSR ETCO2 45 O2 92-95% 2L [place] BP 131/85

## 2024-02-11 NOTE — ED Notes (Signed)
 Mittens placed to bilateral hands for safety due to no sedation at this time.

## 2024-02-11 NOTE — ED Notes (Signed)
Pressure bag applied to IVF

## 2024-02-11 NOTE — H&P (Signed)
 NAME:  Andrea Sanford, MRN:  [id number], DOB:  08/17/1970, LOS: 0 ADMISSION DATE:  02/11/2024, CONSULTATION DATE: 02/11/2024 REFERRING MD: Dr. Jossie, CHIEF COMPLAINT: Unresponsiveness    History of Present Illness:  This is a 53 yo female with PMH of polysubstance abuse, bipolar disorder, HTN, acute hypoxic respiratory failure requiring tracheostomy and G-tube placement following drug overdose.  She presented to [facility] ER on 07/20 via EMS from [facility] in [place] after being found unresponsive.  Per ED notes pt was found covered in feces with last known normal at 04:00 am on 07/20.  Upon review of pts medical record pt attempted to schedule a virtual visit for evaluation of shortness of breath on 07/16.  However, when she was informed it would be better for her to be evaluated in person the pt hung of the telephone.    ED Course  Upon arrival to the ER pt remained unresponsive and received 2 mg of iv narcan  without improvement in mentation requiring mechanical intubation for airway protection.  Pt hypotensive post intubation and received 1L NS, but remained hypotensive requiring levophed  gtt.  Significant lab results were: glucose 154/BUN 22/wbc 11.1/UA negative for UTI.   CXR negative for acute cardiopulmonary abnormality.  CT Head negative for acute intracranial abnormality.  PCCM team contacted for ICU admission.   CT Head 07/20: No evidence of an acute intracranial abnormality. Unchanged small focus of chronic encephalomalacia/gliosis within the anterior left upper lobe, likely post-traumatic in etiology.  Background mild cerebral white matter disease, nonspecific but most often secondary to chronic small vessel ischemia.  Mild-to- moderate polypoid mucosal thickening within the right maxillary sinus.   Pertinent  Medical History  Cocaine Abuse Allergy Anxiety & Depression Arthritis Asthma Bipolar Disorder Cancer COPD & OSA T2DM HTN Neuromuscular  disorder Osteoporosis  Significant Hospital Events: Including procedures, antibiotic start and stop dates in addition to other pertinent events   07/20: Admitted with acute metabolic encephalopathy suspect secondary to drug overdose requiring mechanical intubation for airway protection   Interim History / Subjective:  Pt sedated and mechanically intubated.  She is hypotensive requiring levophed  gtt @4  mcg/min due to hypotension   Objective    Blood pressure (!) 85/59, pulse 91, resp. rate 20, SpO2 98%.       No intake or output data in the 24 hours ending 02/11/24 1238 There were no vitals filed for this visit.  Examination: General: Acutely-ill appearing female, NAD mechanically intubated  HENT: Supple, no JVD  Lungs: Faint rhonchi throughout, even, non labored  Cardiovascular: Sinus bradycardia, s1s2, no m/r/g, 2+ radial/2+ distal pulses, no edema  Abdomen: +BS x4, obese, soft, non distended  Extremities: Normal bulk and tone Neuro: Sedated, not following commands, PERRL GU: Indwelling foley catheter draining clear yellow urine   Resolved problem list   Assessment and Plan   #Acute toxic metabolic encephalopathy suspect secondary to drug overdose  #ETOH Intoxication  #Mechanical ventilation pain/discomfort  Hx: Anxiety, ETOH abuse, polysubstance abuse, anxiety, depression, and bipolar 1 disorder - Maintain RASS goal 0  - PAD protocol to maintain RASS goal: prn fentanyl  and versed   - WUA daily  - High dose thiamine  for 4 days following by 100 mg thiamine  daily  - Folic acid  and mvi  - CIWA protocol  - Hold outpatient gabapentin , clonazepam , effexor , and seroquel  for now  - Once mentation improves will need polysubstance and ETOH abuse cessation counseling   #Hypotension secondary to sedating medications  Echo: 08/17/23:  LVEF 60-65%, normal diastolic  parameters, RV systolic function normal  - Continuous telemetry monitoring  - IV fluid resuscitation and prn levophed   gtt to maintain map 65 or higher  - Troponin negative  - Hold outpatient antihypertensives for now  - Resume outpatient atorvastatin    #Acute respiratory failure secondary to suspected drug overdose of undetermined intent & suspected aspiration Hx: COPD, OSA, previous tracheostomy, and asthma  - Full vent support, implement lung protective strategies - Plateau pressures less than 30 cm H20 - Wean FiO2 & PEEP as tolerated to maintain O2 sats >92% - Follow intermittent Chest X-ray & ABG as needed - SBT's once all parameters met  - VAP bundle implemented  - Prn bronchodilator therapy   #Leukocytosis without signs of infection  - Trend WBC and monitor fever curve  - Follow cultures  - No indication for abx therapy at this time, if pt becomes febrile or develops worsening leukocytosis will start empiric abx therapy   #Hypothermia  - Prn bear hugger to maintain normothermia   #Type II diabetes mellitus  - CBG's q4hrs  - SSI  - Target CBG readings 140 to 180 - Follow hyper/hypoglycemic protocol   Best Practice (right click and Reselect all SmartList Selections daily)   Diet/type: NPO DVT prophylaxis prophylactic heparin   Pressure ulcer(s): N/A GI prophylaxis: H2B Lines: N/A Foley:  Yes, and it is still needed Code Status:  full code Last date of multidisciplinary goals of care discussion [N/A]  Attempted to update pts mother Roderick Cheese via telephone, however she did not answer the telephone.  Left HIPAA appropriate voicemail instructing her to return my phone call Labs   CBC: No results for input(s): WBC, NEUTROABS, HGB, HCT, MCV, PLT in the last 168 hours.  Basic Metabolic Panel: No results for input(s): NA, K, CL, CO2, GLUCOSE, BUN, CREATININE, CALCIUM , MG, PHOS in the last 168 hours. GFR: CrCl cannot be calculated (Patient's most recent lab result is older than the maximum 21 days allowed.). No results for input(s): PROCALCITON,  WBC, LATICACIDVEN in the last 168 hours.  Liver Function Tests: No results for input(s): AST, ALT, ALKPHOS, BILITOT, PROT, ALBUMIN in the last 168 hours. No results for input(s): LIPASE, AMYLASE in the last 168 hours. No results for input(s): AMMONIA in the last 168 hours.  ABG    Component Value Date/Time   PHART 7.43 08/25/2023 1830   PCO2ART 47 08/25/2023 1830   PO2ART 73 (L) 08/25/2023 1830   HCO3 31.2 (H) 08/25/2023 1830   ACIDBASEDEF 2.0 08/19/2023 1506   O2SAT 95.9 08/25/2023 1830     Coagulation Profile: No results for input(s): INR, PROTIME in the last 168 hours.  Cardiac Enzymes: No results for input(s): CKTOTAL, CKMB, CKMBINDEX, TROPONINI in the last 168 hours.  HbA1C: Hemoglobin A1C  Date/Time Value Ref Range Status  10/03/2023 09:43 AM 6.6 (A) 4.0 - 5.6 % Final   Hgb A1c MFr Bld  Date/Time Value Ref Range Status  09/14/2023 06:18 AM 6.8 (H) 4.8 - 5.6 % Final    Comment:    (NOTE) Pre diabetes:          5.7%-6.4%  Diabetes:              >6.4%  Glycemic control for   <7.0% adults with diabetes   06/13/2023 02:40 PM 6.3 (H) 4.8 - 5.6 % Final    Comment:             Prediabetes: 5.7 - 6.4  Diabetes: >6.4          Glycemic control for adults with diabetes: <7.0     CBG: No results for input(s): GLUCAP in the last 168 hours.  Review of Systems:   Unable to assess pt mechanically intubated   Past Medical History:  She,  has a past medical history of Allergy, Anxiety, Arthritis, Asthma, Bipolar 1 disorder (HCC), Cancer (HCC), COPD (chronic obstructive pulmonary disease) (HCC), Depression, Diabetes mellitus without complication (HCC), Drug abuse (HCC), Hypertension, Neuromuscular disorder (HCC), Osteoporosis, and Sleep apnea.   Surgical History:   Past Surgical History:  Procedure Laterality Date   ANKLE SURGERY Right    IR GASTROSTOMY TUBE MOD SED  09/04/2023   IR GASTROSTOMY TUBE REMOVAL  12/13/2023   IR  PATIENT EVAL TECH 0-60 MINS  11/03/2023   PERIPHERAL VASCULAR THROMBECTOMY Left 1991   TRACHEOSTOMY TUBE PLACEMENT N/A 09/01/2023   Procedure: TRACHEOSTOMY;  Surgeon: Blair Mt, MD;  Location: [facility] ORS;  Service: ENT;  Laterality: N/A;     Social History:   reports that she has been smoking cigarettes. She has been exposed to tobacco smoke. She has never used smokeless tobacco. She reports that she does not currently use alcohol after a past usage of about 2.0 standard drinks of alcohol per week. She reports current drug use. Drug: Cocaine.   Family History:  Her family history includes Cancer in her father; Depression in her mother; Diabetes in her mother.   Allergies No Known Allergies   Home Medications  Prior to Admission medications   Medication Sig Start Date End Date Taking? Authorizing Provider  Accu-Chek Softclix Lancets lancets 1 each by Other route 2 (two) times daily. 11/03/23   [provider]  amLODipine  (NORVASC ) 5 MG tablet Take 1 tablet (5 mg total) by mouth daily. 12/08/23   Ziglar, Susan K, MD  Aspirin-Salicylamide-Caffeine  (ARTHRITIS STRENGTH BC POWDER PO) Take by mouth. Takes 2 in the morning    [provider]  atorvastatin  (LIPITOR) 40 MG tablet Take 1 tablet (40 mg total) by mouth daily. 12/08/23   Ziglar, Susan K, MD  baclofen  (LIORESAL ) 10 MG tablet Take 1 tablet (10 mg total) by mouth 3 (three) times daily. 01/18/24   Ziglar, Susan K, MD  Blood Glucose Monitoring Suppl (ACCU-CHEK GUIDE ME) w/Device KIT Place 1 each onto the skin 2 (two) times daily. 11/03/23   [provider]  Blood Glucose Monitoring Suppl DEVI 1 each by Does not apply route in the morning, at noon, and at bedtime. May substitute to any manufacturer covered by patient's insurance. 11/03/23   Ziglar, Susan K, MD  Ca Alginate-Carboxymethylcell PADS Apply 1 each topically daily. 09/29/23   Donzella Lauraine SAILOR, DO  clonazePAM  (KLONOPIN ) 1 MG tablet Take 1 mg by mouth 2 (two) times  daily as needed for anxiety.    [provider]  gabapentin  (NEURONTIN ) 800 MG tablet Take 1 tablet (800 mg total) by mouth 3 (three) times daily. 12/08/23   Ziglar, Susan K, MD  losartan  (COZAAR ) 25 MG tablet Take 1 tablet (25 mg total) by mouth daily. 12/08/23   Ziglar, Susan K, MD  meloxicam  (MOBIC ) 15 MG tablet Take 1 tablet (15 mg total) by mouth daily. 01/18/24   Ziglar, Susan K, MD  montelukast  (SINGULAIR ) 10 MG tablet Take 1 tablet (10 mg total) by mouth at bedtime. 12/08/23   Ziglar, Susan K, MD  nystatin  (MYCOSTATIN /NYSTOP ) powder Apply 1 Application topically 3 (three) times daily. 01/18/24   Ziglar, Susan  K, MD  omeprazole  (PRILOSEC) 40 MG capsule Take 1 capsule (40 mg total) by mouth daily. 01/18/24   Ziglar, Susan K, MD  potassium chloride  (KLOR-CON ) 20 MEQ packet Take 20 mEq by mouth 2 (two) times daily for 5 days. 09/26/23 01/18/24  Charlene Debby BROCKS, PA-C  QUEtiapine  (SEROQUEL ) 400 MG tablet Take 1 tablet (400 mg total) by mouth at bedtime. 01/29/24   Ziglar, Susan K, MD  venlafaxine  XR (EFFEXOR -XR) 150 MG 24 hr capsule Take 1 capsule (150 mg total) by mouth every morning. 01/29/24   Ziglar, Susan K, MD     Critical care time: 60 minutes       Lonell Moose, AGNP  Pulmonary/Critical Care Pager [phone] (please enter 7 digits) PCCM Consult Pager [phone] (please enter 7 digits)

## 2024-02-11 NOTE — ED Provider Notes (Signed)
 [facility] Provider Note   Event Date/Time   First MD Initiated Contact with Patient 02/11/24 1233     (approximate) History  unresponsive  HPI Andrea Sanford is a 53 y.o. female with unknown past medical history that [place] EMS [place] after being found unresponsive.  Patient was not responsive to any verbal or painful stimuli.  Patient had bowel incontinence.  Patient was last seen normal at 4 AM the previous evening.  Patient arrives GCS of 3 with sonorous respirations ROS: Unable to assess   Physical Exam  Triage Vital Signs: ED Triage Vitals [02/11/24 1216]  Encounter Vitals Group     BP 120/81     Girls Systolic BP Percentile      Girls Diastolic BP Percentile      Boys Systolic BP Percentile      Boys Diastolic BP Percentile      Pulse Rate 81     Resp 16     Temp      Temp src      SpO2 (!) 88 %     Weight      Height      Head Circumference      Peak Flow      Pain Score      Pain Loc      Pain Education      Exclude from Growth Chart    Most recent vital signs: Vitals:   02/12/24 1845 02/12/24 1900  BP: 99/62 99/61  Pulse: (!) 55 (!) 54  Resp: 16 15  Temp: 100.2 F (37.9 C) 100.2 F (37.9 C)  SpO2: 94% 94%   General: GCS 3, on stretcher, sonorous respirations CV:  Good peripheral perfusion. Resp:  Decreased effort.  Sonorous respirations Abd:  No distention. Other:  Middle-aged overweight Caucasian female on stretcher breathing spontaneously but not responding to any stimuli ED Results / Procedures / Treatments  Labs (all labs ordered are listed, but only abnormal results are displayed) Labs Reviewed  COMPREHENSIVE METABOLIC PANEL WITH GFR - Abnormal; Notable for the following components:      Result Value   Glucose, Bld 154 (*)    BUN 22 (*)    All other components within normal limits  CBC WITH DIFFERENTIAL/PLATELET - Abnormal; Notable for the following components:   WBC 11.1 (*)    RBC 5.38 (*)     Hemoglobin 16.0 (*)    HCT 48.2 (*)    Neutro Abs 8.2 (*)    All other components within normal limits  URINALYSIS, ROUTINE W REFLEX MICROSCOPIC - Abnormal; Notable for the following components:   Color, Urine STRAW (*)    APPearance CLEAR (*)    Ketones, ur 5 (*)    All other components within normal limits  URINE DRUG SCREEN, QUALITATIVE ([facility] ONLY) - Abnormal; Notable for the following components:   Cocaine Metabolite,Ur [place] POSITIVE (*)    Cannabinoid 50 Ng, Ur [place] POSITIVE (*)    All other components within normal limits  ACETAMINOPHEN  LEVEL - Abnormal; Notable for the following components:   Acetaminophen  (Tylenol ), Serum <10 (*)    All other components within normal limits  SALICYLATE LEVEL - Abnormal; Notable for the following components:   Salicylate Lvl <7.0 (*)    All other components within normal limits  SALICYLATE LEVEL - Abnormal; Notable for the following components:   Salicylate Lvl <7.0 (*)    All other components within normal limits  GLUCOSE, CAPILLARY -  Abnormal; Notable for the following components:   Glucose-Capillary 161 (*)    All other components within normal limits  BASIC METABOLIC PANEL WITH GFR - Abnormal; Notable for the following components:   Potassium 3.1 (*)    Glucose, Bld 113 (*)    BUN 22 (*)    All other components within normal limits  BLOOD GAS, ARTERIAL - Abnormal; Notable for the following components:   pH, Arterial 7.48 (*)    pO2, Arterial 75 (*)    Acid-Base Excess 2.2 (*)    All other components within normal limits  TRIGLYCERIDES - Abnormal; Notable for the following components:   Triglycerides 154 (*)    All other components within normal limits  GLUCOSE, CAPILLARY - Abnormal; Notable for the following components:   Glucose-Capillary 144 (*)    All other components within normal limits  GLUCOSE, CAPILLARY - Abnormal; Notable for the following components:   Glucose-Capillary 128 (*)    All other components within normal limits   GLUCOSE, CAPILLARY - Abnormal; Notable for the following components:   Glucose-Capillary 116 (*)    All other components within normal limits  GLUCOSE, CAPILLARY - Abnormal; Notable for the following components:   Glucose-Capillary 125 (*)    All other components within normal limits  GLUCOSE, CAPILLARY - Abnormal; Notable for the following components:   Glucose-Capillary 108 (*)    All other components within normal limits  GLUCOSE, CAPILLARY - Abnormal; Notable for the following components:   Glucose-Capillary 143 (*)    All other components within normal limits  GLUCOSE, CAPILLARY - Abnormal; Notable for the following components:   Glucose-Capillary 126 (*)    All other components within normal limits  MRSA NEXT GEN BY PCR, NASAL  RESP PANEL BY RT-PCR (RSV, FLU A&B, COVID)  [id number]  RESPIRATORY PANEL BY PCR  CULTURE, RESPIRATORY W GRAM STAIN  PROTIME-INR  BLOOD GAS, ARTERIAL  ETHANOL  CBC  MAGNESIUM   PHOSPHORUS  TRIGLYCERIDES  MAGNESIUM   PHOSPHORUS  BASIC METABOLIC PANEL WITH GFR  TYPE AND SCREEN  TROPONIN I (HIGH SENSITIVITY)  TROPONIN I (HIGH SENSITIVITY)   EKG ED ECG REPORT I, Artist MARLA Kerns, the attending physician, personally viewed and interpreted this ECG. Date: 02/11/2024 EKG Time: 1214 Rate: 84 Rhythm: normal sinus rhythm QRS Axis: normal Intervals: normal ST/T Wave abnormalities: normal Narrative Interpretation: no evidence of acute ischemia RADIOLOGY ED MD interpretation: CT of the head without contrast interpreted by me shows no evidence of acute abnormalities including no intracerebral hemorrhage, obvious masses, or significant edema One-view portable chest/abdomen x-ray interpreted by me shows no evidence of acute abnormalities including no pneumonia, pneumothorax, or widened mediastinum.  ET tube and orogastric tube in adequate positioning - All radiology independently interpreted and agree with radiology assessment Official radiology report(s): No  results found. PROCEDURES: Critical Care performed: Yes, see critical care procedure note(s) Procedure Name: Intubation Date/Time: 02/12/2024 9:56 PM  Performed by: Kerns Artist MARLA, MDPre-anesthesia Checklist: Patient identified, Patient being monitored, Emergency Drugs available, Timeout performed and Suction available Oxygen Delivery Method: Non-rebreather mask Preoxygenation: Pre-oxygenation with 100% oxygen Induction Type: Rapid sequence Ventilation: Mask ventilation without difficulty Laryngoscope Size: Glidescope Grade View: Grade I Tube size: 7.5 mm Number of attempts: 1 Airway Equipment and Method: Video-laryngoscopy Placement Confirmation: ETT inserted through vocal cords under direct vision, CO2 detector and Breath sounds checked- equal and bilateral Secured at: 21 cm Tube secured with: ETT holder Dental Injury: Teeth and Oropharynx as per pre-operative assessment     CRITICAL  CARE Performed by: Daemon Dowty K Nona Gracey  Total critical care time: 51 minutes  Critical care time was exclusive of separately billable procedures and treating other patients.  Critical care was necessary to treat or prevent imminent or life-threatening deterioration.  Critical care was time spent personally by me on the following activities: development of treatment plan with patient and/or surrogate as well as nursing, discussions with consultants, evaluation of patient's response to treatment, examination of patient, obtaining history from patient or surrogate, ordering and performing treatments and interventions, ordering and review of laboratory studies, ordering and review of radiographic studies, pulse oximetry and re-evaluation of patient's condition.  MEDICATIONS ORDERED IN ED: Medications  ketamine  (KETALAR ) 10 MG/ML injection (has no administration in time range)  norepinephrine  (LEVOPHED ) 4mg  in (0.016 mg/mL) premix infusion (0 mcg/min Intravenous Stopped 02/11/24 2328)  docusate sodium   (COLACE) capsule 100 mg (has no administration in time range)  polyethylene glycol (MIRALAX  / GLYCOLAX ) packet 17 g (has no administration in time range)  enoxaparin  (LOVENOX ) injection 40 mg (40 mg Subcutaneous Given 02/12/24 2055)  famotidine  (PEPCID ) tablet 20 mg (20 mg Per Tube Given 02/12/24 2055)  insulin  aspart (novoLOG ) injection 0-15 Units (2 Units Subcutaneous Given 02/12/24 1945)  docusate (COLACE) 50 MG/5ML liquid 100 mg (100 mg Per Tube Given 02/12/24 2055)  polyethylene glycol (MIRALAX  / GLYCOLAX ) packet 17 g (17 g Per Tube Given 02/12/24 1046)  fentaNYL  (SUBLIMAZE ) injection 50 mcg (50 mcg Intravenous Given 02/12/24 0558)  fentaNYL  (SUBLIMAZE ) injection 50-200 mcg (100 mcg Intravenous Given 02/11/24 1529)  thiamine  (VITAMIN B1) 500 mg in sodium chloride  0.9 % 50 mL IVPB (0 mg Intravenous Stopped 02/12/24 1116)    Followed by  thiamine  (VITAMIN B1) tablet 100 mg (has no administration in time range)  dexmedetomidine  (PRECEDEX ) 400 MCG/100ML (4 mcg/mL) infusion (0.7 mcg/kg/hr  80.4 kg Intravenous Infusion Verify 02/12/24 1900)  propofol  (DIPRIVAN ) 1000 MG/100ML infusion (10 mcg/kg/min  80.4 kg Intravenous Infusion Verify 02/12/24 1900)  QUEtiapine  (SEROQUEL ) tablet 400 mg (400 mg Oral Given 02/12/24 1103)  gabapentin  (NEURONTIN ) capsule 800 mg (800 mg Oral Given 02/12/24 2055)  clonazePAM  (KLONOPIN ) tablet 1 mg (has no administration in time range)  Chlorhexidine  Gluconate Cloth 2 % PADS 6 each (has no administration in time range)  folic acid  (FOLVITE ) tablet 1 mg (has no administration in time range)  feeding supplement (VITAL AF 1.2 CAL) liquid 1,000 mL ( Per Tube Infusion Verify 02/12/24 1900)  free water  30 mL (30 mLs Per Tube Given 02/12/24 1945)  Oral care mouth rinse (15 mLs Mouth Rinse Given 02/12/24 1946)  Oral care mouth rinse (has no administration in time range)  naloxone  (NARCAN ) injection 2 mg (2 mg Intravenous Given 02/11/24 1215)  rocuronium  (ZEMURON ) injection 80 mg (80 mg  Intravenous Given 02/11/24 1224)  ketamine  50 mg in normal saline 5 mL (10 mg/mL) syringe (80 mg Intravenous Given 02/11/24 1223)  sodium chloride  0.9 % bolus 1,000 mL (0 mLs Intravenous Stopped 02/11/24 1319)  propofol  (DIPRIVAN ) 1000 MG/100ML infusion (20 mg  New Bag/Given 02/11/24 1529)  lactated ringers  bolus 1,000 mL (0 mLs Intravenous Stopping previously hung infusion 02/12/24 1557)  potassium chloride  10 mEq in 100 mL IVPB (0 mEq Intravenous Stopped 02/12/24 1225)   IMPRESSION / MDM / ASSESSMENT AND PLAN / ED COURSE  I reviewed the triage vital signs and the nursing notes.  The patient is on the cardiac monitor to evaluate for evidence of arrhythmia and/or significant heart rate changes. Patient's presentation is most consistent with acute presentation with potential threat to life or bodily function. Patient presents for altered mental status of unknown origin  Will obtain medical workup and discuss with social work to try to obtain collateral information.  Given History, Physical, and Workup there is no overt concern for a dangerous emergent cause such as, but not limited to, CNS infection, severe Toxidrome, severe metabolic derangement, or stroke. Patient intubated on arrival for airway protection Disposition: Admit; the patient is suffering altered mental status that is persistent and therefore they will be admitted.   FINAL CLINICAL IMPRESSION(S) / ED DIAGNOSES   Final diagnoses:  None   Rx / DC Orders   ED Discharge Orders     None      Note:  This document was prepared using Dragon voice recognition software and may include unintentional dictation errors.   Jossie Artist POUR, MD 02/12/24 2159

## 2024-02-11 NOTE — ED Notes (Signed)
 Called ICU to give report and no nurse is assigned at this time. Call back number provided when a nurse is assigned. Charge RN notified of the same.

## 2024-02-11 NOTE — ED Notes (Signed)
 Non skid socks applied, fall wrist band placed on right wrist and bed alarm turned on.

## 2024-02-12 DIAGNOSIS — J9601 Acute respiratory failure with hypoxia: Secondary | ICD-10-CM

## 2024-02-12 DIAGNOSIS — G928 Other toxic encephalopathy: Secondary | ICD-10-CM | POA: Diagnosis not present

## 2024-02-12 DIAGNOSIS — J9602 Acute respiratory failure with hypercapnia: Secondary | ICD-10-CM

## 2024-02-12 LAB — GLUCOSE, CAPILLARY
Glucose-Capillary: 108 mg/dL — ABNORMAL HIGH (ref 70–99)
Glucose-Capillary: 116 mg/dL — ABNORMAL HIGH (ref 70–99)
Glucose-Capillary: 125 mg/dL — ABNORMAL HIGH (ref 70–99)
Glucose-Capillary: 126 mg/dL — ABNORMAL HIGH (ref 70–99)
Glucose-Capillary: 128 mg/dL — ABNORMAL HIGH (ref 70–99)
Glucose-Capillary: 133 mg/dL — ABNORMAL HIGH (ref 70–99)
Glucose-Capillary: 143 mg/dL — ABNORMAL HIGH (ref 70–99)

## 2024-02-12 LAB — RESPIRATORY PANEL BY PCR

## 2024-02-12 LAB — CBC
HCT: 42.2 % (ref 36.0–46.0)
Hemoglobin: 14 g/dL (ref 12.0–15.0)
MCH: 29.9 pg (ref 26.0–34.0)
MCHC: 33.2 g/dL (ref 30.0–36.0)
MCV: 90 fL (ref 80.0–100.0)
Platelets: 194 K/uL (ref 150–400)
RBC: 4.69 MIL/uL (ref 3.87–5.11)
RDW: 13.6 % (ref 11.5–15.5)
WBC: 10.2 K/uL (ref 4.0–10.5)
nRBC: 0 % (ref 0.0–0.2)

## 2024-02-12 LAB — BLOOD GAS, ARTERIAL
Acid-Base Excess: 2.2 mmol/L — ABNORMAL HIGH (ref 0.0–2.0)
Bicarbonate: 25.3 mmol/L (ref 20.0–28.0)
FIO2: 24 %
MECHVT: 500 mL
Mechanical Rate: 18
O2 Saturation: 95.5 %
PEEP: 5 cmH2O
Patient temperature: 37
pCO2 arterial: 34 mmHg (ref 32–48)
pH, Arterial: 7.48 — ABNORMAL HIGH (ref 7.35–7.45)
pO2, Arterial: 75 mmHg — ABNORMAL LOW (ref 83–108)

## 2024-02-12 LAB — MAGNESIUM: Magnesium: 2 mg/dL (ref 1.7–2.4)

## 2024-02-12 LAB — BASIC METABOLIC PANEL WITH GFR
Anion gap: 14 (ref 5–15)
BUN: 22 mg/dL — ABNORMAL HIGH (ref 6–20)
CO2: 23 mmol/L (ref 22–32)
Calcium: 9.2 mg/dL (ref 8.9–10.3)
Chloride: 110 mmol/L (ref 98–111)
Creatinine, Ser: 0.76 mg/dL (ref 0.44–1.00)
GFR, Estimated: >60 mL/min (ref >60)
Glucose, Bld: 113 mg/dL — ABNORMAL HIGH (ref 70–99)
Potassium: 3.1 mmol/L — ABNORMAL LOW (ref 3.5–5.1)
Sodium: 144 mmol/L (ref 135–145)

## 2024-02-12 LAB — TRIGLYCERIDES: Triglycerides: 154 mg/dL — ABNORMAL HIGH (ref <150)

## 2024-02-12 LAB — PHOSPHORUS: Phosphorus: 3.4 mg/dL (ref 2.5–4.6)

## 2024-02-12 MED ORDER — POTASSIUM CHLORIDE 10 MEQ/100ML IV SOLN
10.0000 meq | INTRAVENOUS | Status: AC
  Administered 2024-02-12 (×3): 10 meq via INTRAVENOUS
  Filled 2024-02-12 (×3): qty 100

## 2024-02-12 MED ORDER — FREE WATER
30.0000 mL | Status: DC
  Administered 2024-02-12 – 2024-02-13 (×6): 30 mL

## 2024-02-12 MED ORDER — FOLIC ACID 1 MG PO TABS
1.0000 mg | ORAL_TABLET | Freq: Every day | ORAL | Status: DC
  Administered 2024-02-13: 1 mg
  Filled 2024-02-12 (×2): qty 1

## 2024-02-12 MED ORDER — PROPOFOL 1000 MG/100ML IV EMUL
0.0000 ug/kg/min | INTRAVENOUS | Status: DC
  Administered 2024-02-12 – 2024-02-13 (×2): 10 ug/kg/min via INTRAVENOUS
  Filled 2024-02-12 (×2): qty 100

## 2024-02-12 MED ORDER — ORAL CARE MOUTH RINSE
15.0000 mL | OROMUCOSAL | Status: DC
  Administered 2024-02-12 – 2024-02-13 (×13): 15 mL via OROMUCOSAL

## 2024-02-12 MED ORDER — ORAL CARE MOUTH RINSE: 15.0000 mL | OROMUCOSAL | Status: DC | PRN

## 2024-02-12 MED ORDER — QUETIAPINE FUMARATE 100 MG PO TABS
400.0000 mg | ORAL_TABLET | Freq: Every day | ORAL | Status: DC
  Administered 2024-02-12: 400 mg via ORAL
  Filled 2024-02-12: qty 4

## 2024-02-12 MED ORDER — CLONAZEPAM 1 MG PO TABS
1.0000 mg | ORAL_TABLET | Freq: Two times a day (BID) | ORAL | Status: DC | PRN
  Administered 2024-02-13: 1 mg via ORAL
  Filled 2024-02-12: qty 1

## 2024-02-12 MED ORDER — DEXMEDETOMIDINE HCL IN NACL 400 MCG/100ML IV SOLN
0.0000 ug/kg/h | INTRAVENOUS | Status: DC
  Administered 2024-02-12: 0.7 ug/kg/h via INTRAVENOUS
  Administered 2024-02-12: 0.4 ug/kg/h via INTRAVENOUS
  Administered 2024-02-12 – 2024-02-13 (×2): 0.7 ug/kg/h via INTRAVENOUS
  Administered 2024-02-13: 0.4 ug/kg/h via INTRAVENOUS
  Filled 2024-02-12 (×4): qty 100

## 2024-02-12 MED ORDER — CHLORHEXIDINE GLUCONATE CLOTH 2 % EX PADS
6.0000 | MEDICATED_PAD | Freq: Every day | CUTANEOUS | Status: DC
  Administered 2024-02-13 – 2024-02-14 (×2): 6 via TOPICAL

## 2024-02-12 MED ORDER — VITAL AF 1.2 CAL PO LIQD
1000.0000 mL | ORAL | Status: DC
  Administered 2024-02-12: 1000 mL

## 2024-02-12 MED ORDER — GABAPENTIN 400 MG PO CAPS
800.0000 mg | ORAL_CAPSULE | Freq: Three times a day (TID) | ORAL | Status: DC
  Administered 2024-02-12 – 2024-02-13 (×4): 800 mg via ORAL
  Filled 2024-02-12 (×5): qty 2

## 2024-02-12 MED ORDER — CLONAZEPAM 0.5 MG PO TABS: 0.5000 mg | ORAL_TABLET | Freq: Two times a day (BID) | ORAL | Status: DC | PRN

## 2024-02-12 NOTE — Plan of Care (Signed)
  Problem: Metabolic: Goal: Ability to maintain appropriate glucose levels will improve Outcome: Progressing   Problem: Nutritional: Goal: Maintenance of adequate nutrition will improve Outcome: Progressing   Problem: Skin Integrity: Goal: Risk for impaired skin integrity will decrease Outcome: Progressing   Problem: Tissue Perfusion: Goal: Adequacy of tissue perfusion will improve Outcome: Progressing   Problem: Clinical Measurements: Goal: Ability to maintain clinical measurements within normal limits will improve Outcome: Progressing Goal: Respiratory complications will improve Outcome: Progressing Goal: Cardiovascular complication will be avoided Outcome: Progressing   Problem: Nutrition: Goal: Adequate nutrition will be maintained Outcome: Progressing   Problem: Elimination: Goal: Will not experience complications related to bowel motility Outcome: Progressing Goal: Will not experience complications related to urinary retention Outcome: Progressing   Problem: Pain Managment: Goal: General experience of comfort will improve and/or be controlled Outcome: Progressing   Problem: Skin Integrity: Goal: Risk for impaired skin integrity will decrease Outcome: Progressing

## 2024-02-12 NOTE — Consult Note (Signed)
 PHARMACY CONSULT NOTE - ELECTROLYTES  Pharmacy Consult for Electrolyte Monitoring and Replacement   Recent Labs: Weight: 80.4 kg (177 lb 4 oz) Estimated Creatinine Clearance: 85.9 mL/min (by C-G formula based on SCr of 0.76 mg/dL). Potassium (mmol/L)  Date Value  02/12/2024 3.1 (L)  10/15/2012 3.0 (L)   Magnesium  (mg/dL)  Date Value  92/78/7974 2.0  10/04/2011 1.4 (L)   Calcium  (mg/dL)  Date Value  92/78/7974 9.2   Calcium , Total (mg/dL)  Date Value  96/75/7985 8.3 (L)   Albumin (g/dL)  Date Value  92/79/7974 4.2  10/03/2023 4.0  10/15/2012 3.9   Phosphorus (mg/dL)  Date Value  92/78/7974 3.4   Sodium (mmol/L)  Date Value  02/12/2024 144  10/03/2023 139  10/15/2012 141    Assessment  Andrea Sanford is a 53 y.o. female presenting to [facility] ([place]) after being found unresponsive. PMH significant forpolysubstance abuse, bipolar disorder, HTN, acute hypoxic respiratory failure requiring tracheostomy and G-tube placement following drug overdose. Pharmacy has been consulted to monitor and replace electrolytes while under PCCM care.  Diet: NPO MIVF: N/A Pertinent medications: norepinephrine  gtt, precedex  gtt  Goal of Therapy: Electrolytes WNL  Plan:  K 3.1: Kcl 10mEq IV x 3 Check BMP, Mg, Phos with AM labs  Thank you for allowing pharmacy to be a part of this patient's care.  Janelys Glassner A Briyanna Billingham, PharmD Clinical Pharmacist 02/12/2024 7:24 AM

## 2024-02-12 NOTE — Progress Notes (Signed)
 Initial Nutrition Assessment  DOCUMENTATION CODES:   Not applicable  INTERVENTION:   Vital 1.2@65ml /hr-  Initiate at 1ml/hr and increase by 10ml/hr q 8 hours until goal rate is reached.   Free water  flushes 30ml q4 hours to maintain tube patency   Regimen provides 1872kcal/day, 117g/day protein and 1444ml/day of free water .   Pt at high refeed risk; recommend monitor potassium, magnesium  and phosphorus labs daily until stable  Folic acid  1mg  daily via tube   Continue high dose IV thiamine    Daily weights   NUTRITION DIAGNOSIS:   Inadequate oral intake related to inability to eat (pt sedated and ventilated) as evidenced by NPO status.  GOAL:   Provide needs based on ASPEN/SCCM guidelines  MONITOR:   Vent status, Labs, Weight trends, TF tolerance, Skin, I & O's  REASON FOR ASSESSMENT:   Consult Enteral/tube feeding initiation and management  ASSESSMENT:   53 y/o female with h/o DM, bipolar disorder, substance abuse, HTN, neuromuscular disorder, osteoporosis, anxiety, depression, COPD, OSA, chronic right clavicle fracture, homelessness and a recent lengthy admission for AKI, overdose and aspiration PNA requiring IR G-tube (placed 2/10 & removed 5/21) and tracheotomy (removed 4/14) and who is now admitted with AMS, intoxication and respiratory failure.  Pt sedated and ventilated. OGT in place. Will plan to initiate tube feeds today. Pt is at high refeed risk. Per chart, pt is down ~6lbs(3%) since having her G-tube removed; this is not significant. Pt is receiving high dose IV thiamine .   Medications reviewed and include: colace, lovenox , insulin , pepcid , miralax , thiamine , propofol   Labs reviewed: K 3.1(L), BUN 22(H), P 3.4 wnl, Mg 2.0 wnl Cbgs- 108, 125, 116 x 24 hrs  AIC 6.6(H)- 3/11  Patient is currently intubated on ventilator support MV: 8.7 L/min Temp (24hrs), Avg:98.8 F (37.1 C), Min:92.1 F (33.4 C), Max:100.6 F (38.1 C)  Propofol : 4.82 ml/hr- provides  127kcal/day   MAP >44mmHg   UOP-   NUTRITION - FOCUSED PHYSICAL EXAM:  Flowsheet Row Most Recent Value  Orbital Region No depletion  Upper Arm Region Moderate depletion  Thoracic and Lumbar Region No depletion  Buccal Region No depletion  Temple Region No depletion  Clavicle Bone Region Mild depletion  Clavicle and Acromion Bone Region Mild depletion  Scapular Bone Region No depletion  Dorsal Hand No depletion  Patellar Region Moderate depletion  Anterior Thigh Region Moderate depletion  Posterior Calf Region Moderate depletion  Edema (RD Assessment) None  Hair Reviewed  Eyes Reviewed  Mouth Reviewed  Skin Reviewed  Nails Reviewed   Diet Order:   Diet Order     None      EDUCATION NEEDS:   No education needs have been identified at this time  Skin:  Skin Assessment: Reviewed RN Assessment (ecchymosis)  Last BM:  7/21- type 6  Height:   Ht Readings from Last 1 Encounters:  02/12/24 5' 6 (1.676 m)    Weight:   Wt Readings from Last 1 Encounters:  02/12/24 80.4 kg    Ideal Body Weight:  59 kg  BMI:  Body mass index is 28.61 kg/m.  Estimated Nutritional Needs:   Kcal:  1784kcal/day  Protein:  105-120g/day  Fluid:  1.8-2.1L/day  Augustin Shams MS, RD, LDN If unable to be reached, please send secure chat to RD inpatient available from 8:00a-4:00p daily

## 2024-02-12 NOTE — Progress Notes (Signed)
 Patient agitated, kicking pulling at ECG wires and pulling gown off. IV that propofol  was infusing in found to be out upon assessment. RT at bedside and Dr. Isaiah. Precedex  started per MD. Dr. Isaiah gave order to infuse propofol  as well and will try to adjust IV drips and start other medications. Patient very agitated, not following commands.

## 2024-02-12 NOTE — Progress Notes (Signed)
 This RN made Dr. Isaiah aware that patient's mother Roderick just called and wanted staff to know that she believes the patient could be suicidal. Roderick stated that patient has not said this but has said things that make her believe she could be and the last time she spoke to her daughter regarding this was 3 weeks ago. MD acknowledged.

## 2024-02-12 NOTE — Progress Notes (Signed)
 RT found VT changed to 580 not notified so changed back to 500

## 2024-02-12 NOTE — Progress Notes (Signed)
 NAME:  Andrea Sanford, MRN:  [id number], DOB:  Apr 18, 1971, LOS: 1 ADMISSION DATE:  02/11/2024, CONSULTATION DATE: 02/11/2024 REFERRING MD: Dr. Jossie,   CHIEF COMPLAINT: Unresponsiveness    History of Present Illness:  53 yo female with PMH of polysubstance abuse, bipolar disorder, HTN, acute hypoxic respiratory failure requiring tracheostomy and G-tube placement following drug overdose.  She presented to [facility] ER on 07/20 via EMS from [facility] in [place] after being found unresponsive.  Per ED notes pt was found covered in feces with last known normal at 04:00 am on 07/20.  Upon review of pts medical record pt attempted to schedule a virtual visit for evaluation of shortness of breath on 07/16.  However, when she was informed it would be better for her to be evaluated in person the pt hung of the telephone.    ED Course  Upon arrival to the ER pt remained unresponsive and received 2 mg of iv narcan  without improvement in mentation requiring mechanical intubation for airway protection.  Pt hypotensive post intubation and received 1L NS, but remained hypotensive requiring levophed  gtt.  Significant lab results were: glucose 154/BUN 22/wbc 11.1/UA negative for UTI.   CXR negative for acute cardiopulmonary abnormality.  CT Head negative for acute intracranial abnormality.  PCCM team contacted for ICU admission.   CT Head 07/20: No evidence of an acute intracranial abnormality. Unchanged small focus of chronic encephalomalacia/gliosis within the anterior left upper lobe, likely post-traumatic in etiology.  Background mild cerebral white matter disease, nonspecific but most often secondary to chronic small vessel ischemia.  Mild-to- moderate polypoid mucosal thickening within the right maxillary sinus.   Pertinent  Medical History  Cocaine Abuse Allergy Anxiety & Depression Arthritis Asthma Bipolar Disorder Cancer COPD & OSA T2DM HTN Neuromuscular disorder Osteoporosis  Significant  Hospital Events: Including procedures, antibiotic start and stop dates in addition to other pertinent events   07/20: Admitted with acute metabolic encephalopathy suspect secondary to drug overdose requiring mechanical intubation for airway protection  7/21 remains ill, severe encephalopathy  Interim History / Subjective:  Remains critically ill Remains intubated Vent Mode: PRVC FiO2 (%):  [0.3 %-24 %] 24 % Set Rate:  [18 bmp] 18 bmp Vt Set:  [500 mL] 500 mL PEEP:  [5 cmH20] 5 cmH20 Plateau Pressure:  [12 cmH20-18 cmH20] 12 cmH20    Objective    Blood pressure 133/78, pulse (!) 103, temperature (!) 100.6 F (38.1 C), temperature source Bladder, resp. rate 15, weight 80.4 kg, SpO2 92%.    Vent Mode: PRVC FiO2 (%):  [0.3 %-24 %] 24 % Set Rate:  [18 bmp] 18 bmp Vt Set:  [500 mL] 500 mL PEEP:  [5 cmH20] 5 cmH20 Plateau Pressure:  [12 cmH20-18 cmH20] 12 cmH20   Intake/Output Summary (Last 24 hours) at 02/12/2024 0751 Last data filed at 02/12/2024 0400 Gross per 24 hour  Intake 1000 ml  Output 1100 ml  Net -100 ml   Filed Weights   02/11/24 1520 02/12/24 0455  Weight: 79.9 kg 80.4 kg      REVIEW OF SYSTEMS  PATIENT IS UNABLE TO PROVIDE COMPLETE REVIEW OF SYSTEMS DUE TO SEVERE CRITICAL ILLNESS   PHYSICAL EXAMINATION:  GENERAL:critically ill appearing, +resp distress EYES: Pupils equal, round, reactive to light.  No scleral icterus.  MOUTH: Moist mucosal membrane. INTUBATED NECK: Supple.  PULMONARY: Lungs clear to auscultation, +rhonchi,  CARDIOVASCULAR: S1 and S2.  Regular rate and rhythm GASTROINTESTINAL: Soft, nontender, -distended. Positive bowel sounds.  MUSCULOSKELETAL: -edema.  NEUROLOGIC: sedated SKIN:normal, warm to touch, Capillary refill delayed  Pulses present bilaterally      Assessment and Plan   53 yo Drug induced incephalopathy - UDS - THC, Cocaine and EtOH with Acute toxic metabolic encephalopathy leading to inability to protect airway +ETOH  Intoxication  #Mechanical ventilation pain/discomfort  Hx: Anxiety, ETOH abuse, polysubstance abuse, anxiety, depression, and bipolar 1 disorder  Severe ACUTE Hypoxic and Hypercapnic Respiratory Failure -continue Mechanical Ventilator support -Wean Fio2 and PEEP as tolerated -VAP/VENT bundle implementation - Wean PEEP & FiO2 as tolerated, maintain SpO2 > 88% - Head of bed elevated 30 degrees, VAP protocol in place - Plateau pressures less than 30 cm H20  - Intermittent chest x-ray & ABG PRN - Ensure adequate pulmonary hygiene  -will perform SAT/SBT when respiratory parameters are met  Hypotension secondary to sedating medications  Echo: 08/17/23:  LVEF 60-65%, normal diastolic parameters, RV systolic function normal  - Continuous telemetry monitoring  - IV fluid resuscitation and prn levophed  gtt to maintain map 65 or higher  - Troponin negative  - Hold outpatient antihypertensives for now  - Resume outpatient atorvastatin     NEUROLOGY ACUTE METABOLIC ENCEPHALOPATHY -need for sedation -Goal RASS -2 to -3   ENDO - ICU hypoglycemic\Hyperglycemia protocol -check FSBS per protocol   GI GI PROPHYLAXIS as indicated NUTRITIONAL STATUS DIET-->TF's as tolerated Constipation protocol as indicated   ELECTROLYTES -follow labs as needed -replace as needed -pharmacy consultation and following  RESTRICTIVE TRANSFUSION PROTOCOL TRANSFUSION  IF HGB<7  or ACTIVE BLEEDING OR DX of ACUTE CORONARY SYNDROMES      Best Practice (right click and Reselect all SmartList Selections daily)   Diet/type: NPO DVT prophylaxis prophylactic heparin   Pressure ulcer(s): N/A GI prophylaxis: H2B Lines: N/A Foley:  Yes, and it is still needed Code Status:  full code Last date of multidisciplinary goals of care discussion [N/A]  7/20 Attempted to update pts mother Roderick Cheese via telephone, however she did not answer the telephone.  Left HIPAA appropriate voicemail instructing her to  return my phone call Labs   CBC: Recent Labs  Lab 02/11/24 1218 02/12/24 0541  WBC 11.1* 10.2  NEUTROABS 8.2*  --   HGB 16.0* 14.0  HCT 48.2* 42.2  MCV 89.6 90.0  PLT 224 194    Basic Metabolic Panel: Recent Labs  Lab 02/11/24 1218 02/12/24 0541  NA 140 144  K 3.8 3.1*  CL 104 110  CO2 24 23  GLUCOSE 154* 113*  BUN 22* 22*  CREATININE 0.86 0.76  CALCIUM  9.9 9.2  MG  --  2.0  PHOS  --  3.4   GFR: Estimated Creatinine Clearance: 85.9 mL/min (by C-G formula based on SCr of 0.76 mg/dL). Recent Labs  Lab 02/11/24 1218 02/12/24 0541  WBC 11.1* 10.2    Liver Function Tests: Recent Labs  Lab 02/11/24 1218  AST 23  ALT 14  ALKPHOS 92  BILITOT 1.1  PROT 7.8  ALBUMIN 4.2   No results for input(s): LIPASE, AMYLASE in the last 168 hours. No results for input(s): AMMONIA in the last 168 hours.  ABG    Component Value Date/Time   PHART 7.48 (H) 02/12/2024 0335   PCO2ART 34 02/12/2024 0335   PO2ART 75 (L) 02/12/2024 0335   HCO3 25.3 02/12/2024 0335   ACIDBASEDEF 0.7 02/11/2024 1247   O2SAT 95.5 02/12/2024 0335     Coagulation Profile: Recent Labs  Lab 02/11/24 1218  INR 1.0    Cardiac Enzymes: No results for  input(s): CKTOTAL, CKMB, CKMBINDEX, TROPONINI in the last 168 hours.  HbA1C: Hemoglobin A1C  Date/Time Value Ref Range Status  10/03/2023 09:43 AM 6.6 (A) 4.0 - 5.6 % Final   Hgb A1c MFr Bld  Date/Time Value Ref Range Status  09/14/2023 06:18 AM 6.8 (H) 4.8 - 5.6 % Final    Comment:    (NOTE) Pre diabetes:          5.7%-6.4%  Diabetes:              >6.4%  Glycemic control for   <7.0% adults with diabetes   06/13/2023 02:40 PM 6.3 (H) 4.8 - 5.6 % Final    Comment:             Prediabetes: 5.7 - 6.4          Diabetes: >6.4          Glycemic control for adults with diabetes: <7.0     CBG: Recent Labs  Lab 02/11/24 1508 02/11/24 1933 02/11/24 2352 02/12/24 0440 02/12/24 0729  GLUCAP 161* 144* 128* 116* 125*      DVT/GI PRX  assessed I Assessed the need for Labs I Assessed the need for Foley I Assessed the need for Central Venous Line Family Discussion when available I Assessed the need for Mobilization I made an Assessment of medications to be adjusted accordingly Safety Risk assessment completed  CASE DISCUSSED IN MULTIDISCIPLINARY ROUNDS WITH ICU TEAM     Critical Care Time devoted to patient care services described in this note is 55 minutes.    Nickolas Alm Cellar, M.D.  [facility]  Medical Director [facility] Medical Director [facility] Cardio-Pulmonary Department

## 2024-02-12 NOTE — Progress Notes (Signed)
 Notified Dr. Isaiah that patient has thick yellow/white sputum from ETT and that her temperature during this shift has maintained around 100-100.5. RN also made MD aware that patient has only had 200 cc urine output so far this shift and was bladder scanned with result 79 cc. MD acknowledged all mentioned above.

## 2024-02-12 NOTE — Plan of Care (Signed)
  Problem: Fluid Volume: Goal: Ability to maintain a balanced intake and output will improve Outcome: Progressing   Problem: Metabolic: Goal: Ability to maintain appropriate glucose levels will improve Outcome: Progressing   Problem: Tissue Perfusion: Goal: Adequacy of tissue perfusion will improve Outcome: Progressing   Problem: Clinical Measurements: Goal: Cardiovascular complication will be avoided Outcome: Progressing   Problem: Coping: Goal: Level of anxiety will decrease Outcome: Progressing

## 2024-02-13 DIAGNOSIS — G928 Other toxic encephalopathy: Secondary | ICD-10-CM | POA: Diagnosis not present

## 2024-02-13 DIAGNOSIS — J9601 Acute respiratory failure with hypoxia: Secondary | ICD-10-CM | POA: Diagnosis not present

## 2024-02-13 DIAGNOSIS — E876 Hypokalemia: Secondary | ICD-10-CM

## 2024-02-13 LAB — PHOSPHORUS: Phosphorus: 3 mg/dL (ref 2.5–4.6)

## 2024-02-13 LAB — BASIC METABOLIC PANEL WITH GFR
Anion gap: 10 (ref 5–15)
BUN: 32 mg/dL — ABNORMAL HIGH (ref 6–20)
CO2: 25 mmol/L (ref 22–32)
Calcium: 9 mg/dL (ref 8.9–10.3)
Chloride: 110 mmol/L (ref 98–111)
Creatinine, Ser: 0.93 mg/dL (ref 0.44–1.00)
GFR, Estimated: >60 mL/min (ref >60)
Glucose, Bld: 139 mg/dL — ABNORMAL HIGH (ref 70–99)
Potassium: 3.3 mmol/L — ABNORMAL LOW (ref 3.5–5.1)
Sodium: 145 mmol/L (ref 135–145)

## 2024-02-13 LAB — GLUCOSE, CAPILLARY
Glucose-Capillary: 149 mg/dL — ABNORMAL HIGH (ref 70–99)
Glucose-Capillary: 154 mg/dL — ABNORMAL HIGH (ref 70–99)
Glucose-Capillary: 167 mg/dL — ABNORMAL HIGH (ref 70–99)
Glucose-Capillary: 87 mg/dL (ref 70–99)
Glucose-Capillary: 93 mg/dL (ref 70–99)
Glucose-Capillary: 108 mg/dL — ABNORMAL HIGH (ref 70–99)

## 2024-02-13 LAB — MAGNESIUM: Magnesium: 2.2 mg/dL (ref 1.7–2.4)

## 2024-02-13 LAB — TRIGLYCERIDES: Triglycerides: 103 mg/dL (ref <150)

## 2024-02-13 MED ORDER — OLANZAPINE 10 MG IM SOLR
2.5000 mg | Freq: Once | INTRAMUSCULAR | Status: AC
  Administered 2024-02-14: 2.5 mg via INTRAMUSCULAR
  Filled 2024-02-13: qty 10

## 2024-02-13 MED ORDER — SODIUM CHLORIDE 0.9 % IV SOLN
2.0000 g | INTRAVENOUS | Status: DC
  Administered 2024-02-13: 2 g via INTRAVENOUS
  Filled 2024-02-13: qty 20

## 2024-02-13 MED ORDER — PHENOBARBITAL SODIUM 130 MG/ML IJ SOLN
130.0000 mg | Freq: Once | INTRAMUSCULAR | Status: AC
  Administered 2024-02-13: 130 mg via INTRAVENOUS
  Filled 2024-02-13: qty 1

## 2024-02-13 MED ORDER — DEXMEDETOMIDINE HCL IN NACL 400 MCG/100ML IV SOLN
0.0000 ug/kg/h | INTRAVENOUS | Status: DC
  Administered 2024-02-13: 0.6 ug/kg/h via INTRAVENOUS
  Administered 2024-02-14: 0.7 ug/kg/h via INTRAVENOUS
  Filled 2024-02-13 (×2): qty 100

## 2024-02-13 MED ORDER — PIPERACILLIN-TAZOBACTAM 3.375 G IVPB
3.3750 g | Freq: Three times a day (TID) | INTRAVENOUS | Status: DC
  Administered 2024-02-13 – 2024-02-14 (×2): 3.375 g via INTRAVENOUS
  Filled 2024-02-13 (×2): qty 50

## 2024-02-13 MED ORDER — POTASSIUM CHLORIDE 10 MEQ/100ML IV SOLN
10.0000 meq | INTRAVENOUS | Status: AC
  Administered 2024-02-13 (×3): 10 meq via INTRAVENOUS
  Filled 2024-02-13 (×3): qty 100

## 2024-02-13 MED ORDER — QUETIAPINE FUMARATE 25 MG PO TABS
200.0000 mg | ORAL_TABLET | Freq: Every day | ORAL | Status: DC
  Administered 2024-02-13: 200 mg via ORAL
  Filled 2024-02-13: qty 8

## 2024-02-13 MED ORDER — SODIUM CHLORIDE 0.9 % IV SOLN
500.0000 mg | INTRAVENOUS | Status: DC
  Administered 2024-02-13: 500 mg via INTRAVENOUS
  Filled 2024-02-13 (×2): qty 5

## 2024-02-13 MED ORDER — LACTATED RINGERS IV SOLN: INTRAVENOUS | Status: DC

## 2024-02-13 NOTE — Progress Notes (Signed)
 Pt lying in bed, oriented x4, wearing bilateral mitts. Pt continuously removes tele monitoring and oxygen despite education by staff. LIP made aware. CCMD made aware.

## 2024-02-13 NOTE — Progress Notes (Signed)
 Attempted to remove blankets from pt d/t temp of 101.5. Pt refused and will not allow any other interventions at this time.

## 2024-02-13 NOTE — Procedures (Signed)
 Extubation Procedure Note  Patient Details:   Name: Andrea Sanford DOB: 12/16/1970 MRN: [id number]   Airway Documentation:    Vent end date: 02/13/24 Vent end time: 1312   Evaluation  O2 sats: stable throughout Complications: No apparent complications Patient did tolerate procedure well. Bilateral Breath Sounds: Clear, Diminished   Yes  Patient extubated to [facility] at 60% FIO2 / 40 LPM with no complications.  Patient able to speak and cough.    Andrea Sanford 02/13/2024, 1:13 PM

## 2024-02-13 NOTE — Progress Notes (Signed)
 NAME:  Andrea Sanford, MRN:  [id number], DOB:  02/26/1971, LOS: 2 ADMISSION DATE:  02/11/2024, CHIEF COMPLAINT:  Respiratory Failure   History of Present Illness:  53 yo female with PMH of polysubstance abuse, bipolar disorder, HTN, acute hypoxic respiratory failure requiring tracheostomy and G-tube placement following drug overdose.  She presented to [facility] ER on 07/20 via EMS from [facility] in [place] after being found unresponsive.  Per ED notes pt was found covered in feces with last known normal at 04:00 am on 07/20.  Upon review of pts medical record pt attempted to schedule a virtual visit for evaluation of shortness of breath on 07/16.  However, when she was informed it would be better for her to be evaluated in person the pt hung of the telephone.     ED Course  Upon arrival to the ER pt remained unresponsive and received 2 mg of iv narcan  without improvement in mentation requiring mechanical intubation for airway protection.  Pt hypotensive post intubation and received 1L NS, but remained hypotensive requiring levophed  gtt.  Significant lab results were: glucose 154/BUN 22/wbc 11.1/UA negative for UTI.   CXR negative for acute cardiopulmonary abnormality.  CT Head negative for acute intracranial abnormality.  PCCM team contacted for ICU admission.    CT Head 07/20: No evidence of an acute intracranial abnormality. Unchanged small focus of chronic encephalomalacia/gliosis within the anterior left upper lobe, likely post-traumatic in etiology.  Background mild cerebral white matter disease, nonspecific but most often secondary to chronic small vessel ischemia.  Mild-to- moderate polypoid mucosal thickening within the right maxillary sinus.  Pertinent  Medical History  Cocaine Abuse Allergy Anxiety & Depression Arthritis Asthma Bipolar Disorder Cancer COPD & OSA T2DM HTN Neuromuscular disorder Osteoporosis  Significant Hospital Events: Including procedures, antibiotic start  and stop dates in addition to other pertinent events   07/20: Admitted with acute metabolic encephalopathy suspect secondary to drug overdose requiring mechanical intubation for airway protection  7/21 remains ill, severe encephalopathy 7/22 encephalopathic but follows commands  Interim History / Subjective:  Following commands, encephalopathic  Objective    Blood pressure (!) 146/87, pulse 92, temperature 99.3 F (37.4 C), resp. rate (!) 32, height 5' 6 (1.676 m), weight 78.4 kg, SpO2 95%.    Vent Mode: PSV;CPAP FiO2 (%):  [24 %-60 %] 60 % Set Rate:  [15 bmp] 15 bmp Vt Set:  [500 mL] 500 mL PEEP:  [5 cmH20] 5 cmH20 Pressure Support:  [5 cmH20] 5 cmH20 Plateau Pressure:  [14 cmH20-17 cmH20] 14 cmH20   Intake/Output Summary (Last 24 hours) at 02/13/2024 1355 Last data filed at 02/13/2024 1311 Gross per 24 hour  Intake 1675.27 ml  Output 250 ml  Net 1425.27 ml   Filed Weights   02/11/24 1520 02/12/24 0455 02/13/24 0509  Weight: 79.9 kg 80.4 kg 78.4 kg    Examination: Physical Exam Constitutional:      General: She is not in acute distress.    Appearance: She is ill-appearing.  Cardiovascular:     Rate and Rhythm: Normal rate and regular rhythm.     Pulses: Normal pulses.     Heart sounds: Normal heart sounds.  Pulmonary:     Breath sounds: No wheezing.     Comments: Ventilated breath sounds bilaterally Abdominal:     General: There is no distension.     Palpations: Abdomen is soft.  Neurological:     Mental Status: She is disoriented.     Assessment and Plan  53 year old female with history of chronic polysubstance abuse, previously with a prolonged hospitalization secondary to respiratory failure that required tracheostomy tube placement (patient left AMA and self decannulated). She was found down and intubated after failing to respond to narcan . She is admitted to the ICU for management of respiratory failure.  #Acute Hypoxic Respiratory Failure #Toxic  Metabolic Encephalopathy #Polysubstance Use  #Polypharmacy #HypoKalemia  Neuro - home meds include baclofen  10 tid, clonazepam  1 bid, gabapentin  800 tid, quetiapine  400 every day, and venlafaxine  150 every day - on dexmedetomidine  for sedation, did well with WUA this AM and extubated. Will resume home medications slowly as she arouses. Unclear how much of this presentation is overdose from illicit drug use vs polypharmacy. CV - sedation related hypotension, now discontinued. Maintaining normal BP. Pulm - respiratory failure secondary to overdose and polysubstance use, and likely with aspiration pneumonia. Did well with SBT this AM, and with improvement in mental status we have proceeded with extubation to HFNC given high risk for post extubation failure. GI - NPO for now, will d/c SUP Renal - kidney function is within normal, with hypokalemia likely secondary to decreased PO intake. Will initiate LR at 100 mL/hour for hydration. Endo - ICU glycemic protocol Hem/Onc - lovenox  for DVT prophylaxis ID - will start Ceftriaxone  and Azithromycin  for coverage of aspiration pneumonia  Best Practice (right click and Reselect all SmartList Selections daily)   Diet/type: NPO DVT prophylaxis LMWH Pressure ulcer(s): N/A GI prophylaxis: N/A Lines: N/A Foley:  Yes, and it is still needed Code Status:  full code Last date of multidisciplinary goals of care discussion [02/13/2024]  Labs   CBC: Recent Labs  Lab 02/11/24 1218 02/12/24 0541  WBC 11.1* 10.2  NEUTROABS 8.2*  --   HGB 16.0* 14.0  HCT 48.2* 42.2  MCV 89.6 90.0  PLT 224 194    Basic Metabolic Panel: Recent Labs  Lab 02/11/24 1218 02/12/24 0541 02/13/24 0323  NA 140 144 145  K 3.8 3.1* 3.3*  CL 104 110 110  CO2 24 23 25   GLUCOSE 154* 113* 139*  BUN 22* 22* 32*  CREATININE 0.86 0.76 0.93  CALCIUM  9.9 9.2 9.0  MG  --  2.0 2.2  PHOS  --  3.4 3.0   GFR: Estimated Creatinine Clearance: 73 mL/min (by C-G formula based on SCr  of 0.93 mg/dL). Recent Labs  Lab 02/11/24 1218 02/12/24 0541  WBC 11.1* 10.2    Liver Function Tests: Recent Labs  Lab 02/11/24 1218  AST 23  ALT 14  ALKPHOS 92  BILITOT 1.1  PROT 7.8  ALBUMIN 4.2   No results for input(s): LIPASE, AMYLASE in the last 168 hours. No results for input(s): AMMONIA in the last 168 hours.  ABG    Component Value Date/Time   PHART 7.48 (H) 02/12/2024 0335   PCO2ART 34 02/12/2024 0335   PO2ART 75 (L) 02/12/2024 0335   HCO3 25.3 02/12/2024 0335   ACIDBASEDEF 0.7 02/11/2024 1247   O2SAT 95.5 02/12/2024 0335     Coagulation Profile: Recent Labs  Lab 02/11/24 1218  INR 1.0    Cardiac Enzymes: No results for input(s): CKTOTAL, CKMB, CKMBINDEX, TROPONINI in the last 168 hours.  HbA1C: Hemoglobin A1C  Date/Time Value Ref Range Status  10/03/2023 09:43 AM 6.6 (A) 4.0 - 5.6 % Final   Hgb A1c MFr Bld  Date/Time Value Ref Range Status  09/14/2023 06:18 AM 6.8 (H) 4.8 - 5.6 % Final    Comment:    (  NOTE) Pre diabetes:          5.7%-6.4%  Diabetes:              >6.4%  Glycemic control for   <7.0% adults with diabetes   06/13/2023 02:40 PM 6.3 (H) 4.8 - 5.6 % Final    Comment:             Prediabetes: 5.7 - 6.4          Diabetes: >6.4          Glycemic control for adults with diabetes: <7.0     CBG: Recent Labs  Lab 02/12/24 1920 02/12/24 2307 02/13/24 0322 02/13/24 0716 02/13/24 1143  GLUCAP 126* 133* 149* 154* 167*    Review of Systems:   N/A  Past Medical History:  She,  has a past medical history of Allergy, Anxiety, Arthritis, Asthma, Bipolar 1 disorder (HCC), Cancer (HCC), COPD (chronic obstructive pulmonary disease) (HCC), Depression, Diabetes mellitus without complication (HCC), Drug abuse (HCC), Hypertension, Neuromuscular disorder (HCC), Osteoporosis, and Sleep apnea.   Surgical History:   Past Surgical History:  Procedure Laterality Date   ANKLE SURGERY Right    IR GASTROSTOMY TUBE MOD SED   09/04/2023   IR GASTROSTOMY TUBE REMOVAL  12/13/2023   IR PATIENT EVAL TECH 0-60 MINS  11/03/2023   PERIPHERAL VASCULAR THROMBECTOMY Left 1991   TRACHEOSTOMY TUBE PLACEMENT N/A 09/01/2023   Procedure: TRACHEOSTOMY;  Surgeon: Blair Mt, MD;  Location: [facility] ORS;  Service: ENT;  Laterality: N/A;     Social History:   reports that she has been smoking cigarettes. She has been exposed to tobacco smoke. She has never used smokeless tobacco. She reports that she does not currently use alcohol after a past usage of about 2.0 standard drinks of alcohol per week. She reports current drug use. Drug: Cocaine.   Family History:  Her family history includes Cancer in her father; Depression in her mother; Diabetes in her mother.   Allergies No Known Allergies   Home Medications  Prior to Admission medications   Medication Sig Start Date End Date Taking? Authorizing Provider  Accu-Chek Softclix Lancets lancets 1 each by Other route 2 (two) times daily. 11/03/23   [provider]  amLODipine  (NORVASC ) 5 MG tablet Take 1 tablet (5 mg total) by mouth daily. 12/08/23   Ziglar, Susan K, MD  Aspirin-Salicylamide-Caffeine  (ARTHRITIS STRENGTH BC POWDER PO) Take by mouth. Takes 2 in the morning    [provider]  atorvastatin  (LIPITOR) 40 MG tablet Take 1 tablet (40 mg total) by mouth daily. 12/08/23   Ziglar, Susan K, MD  baclofen  (LIORESAL ) 10 MG tablet Take 1 tablet (10 mg total) by mouth 3 (three) times daily. 01/18/24   Ziglar, Susan K, MD  Blood Glucose Monitoring Suppl (ACCU-CHEK GUIDE ME) w/Device KIT Place 1 each onto the skin 2 (two) times daily. 11/03/23   [provider]  Blood Glucose Monitoring Suppl DEVI 1 each by Does not apply route in the morning, at noon, and at bedtime. May substitute to any manufacturer covered by patient's insurance. 11/03/23   Ziglar, Susan K, MD  Ca Alginate-Carboxymethylcell PADS Apply 1 each topically daily. 09/29/23   Donzella Lauraine SAILOR, DO  clonazePAM   (KLONOPIN ) 1 MG tablet Take 1 mg by mouth 2 (two) times daily as needed for anxiety.    [provider]  gabapentin  (NEURONTIN ) 800 MG tablet Take 1 tablet (800 mg total) by mouth 3 (three) times daily. 12/08/23   Ziglar,  Devere POUR, MD  losartan  (COZAAR ) 25 MG tablet Take 1 tablet (25 mg total) by mouth daily. 12/08/23   Ziglar, Susan K, MD  meloxicam  (MOBIC ) 15 MG tablet Take 1 tablet (15 mg total) by mouth daily. 01/18/24   Ziglar, Susan K, MD  montelukast  (SINGULAIR ) 10 MG tablet Take 1 tablet (10 mg total) by mouth at bedtime. 12/08/23   Ziglar, Susan K, MD  nystatin  (MYCOSTATIN /NYSTOP ) powder Apply 1 Application topically 3 (three) times daily. 01/18/24   Ziglar, Susan K, MD  omeprazole  (PRILOSEC) 40 MG capsule Take 1 capsule (40 mg total) by mouth daily. 01/18/24   Ziglar, Susan K, MD  potassium chloride  (KLOR-CON ) 20 MEQ packet Take 20 mEq by mouth 2 (two) times daily for 5 days. 09/26/23 01/18/24  Charlene Debby BROCKS, PA-C  QUEtiapine  (SEROQUEL ) 400 MG tablet Take 1 tablet (400 mg total) by mouth at bedtime. 01/29/24   Ziglar, Susan K, MD  venlafaxine  XR (EFFEXOR -XR) 150 MG 24 hr capsule Take 1 capsule (150 mg total) by mouth every morning. 01/29/24   Ziglar, Susan K, MD     The patient is critically ill due to respiratory failure and encephalopathy.  Critical care was necessary to treat or prevent imminent or life-threatening deterioration. Critical care time was spent by me on the following activities: development of a treatment plan with the patient and/or surrogate as well as nursing, discussions with consultants, evaluation of the patient's response to treatment, examination of the patient, obtaining a history from the patient or surrogate, ordering and performing treatments and interventions, ordering and review of laboratory studies, ordering and review of radiographic studies, review of telemetry data including pulse oximetry, re-evaluation of patient's condition and participation in  multidisciplinary rounds.   I personally spent 48 minutes providing critical care not including any separately billable procedures.   Belva November, MD [facility] 02/13/2024 2:10 PM

## 2024-02-13 NOTE — Progress Notes (Signed)
 Pt refusing to wear heated high flow [place]. Dr. Isadora aware.
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
## 2024-02-13 NOTE — Consult Note (Signed)
 PHARMACY CONSULT NOTE - ELECTROLYTES  Pharmacy Consult for Electrolyte Monitoring and Replacement   Recent Labs: Height: 5' 6 (167.6 cm) Weight: 78.4 kg (172 lb 13.5 oz) IBW/kg (Calculated) : 59.3 Estimated Creatinine Clearance: 73 mL/min (by C-G formula based on SCr of 0.93 mg/dL). Potassium (mmol/L)  Date Value  02/13/2024 3.3 (L)  10/15/2012 3.0 (L)   Magnesium  (mg/dL)  Date Value  92/77/7974 2.2  10/04/2011 1.4 (L)   Calcium  (mg/dL)  Date Value  92/77/7974 9.0   Calcium , Total (mg/dL)  Date Value  96/75/7985 8.3 (L)   Albumin (g/dL)  Date Value  92/79/7974 4.2  10/03/2023 4.0  10/15/2012 3.9   Phosphorus (mg/dL)  Date Value  92/77/7974 3.0   Sodium (mmol/L)  Date Value  02/13/2024 145  10/03/2023 139  10/15/2012 141    Assessment  Andrea Sanford is a 53 y.o. female presenting to [facility] after being found unresponsive. PMH significant forpolysubstance abuse, bipolar disorder, HTN, acute hypoxic respiratory failure requiring tracheostomy and G-tube placement following drug overdose. Pharmacy has been consulted to monitor and replace electrolytes while under PCCM care.  Diet: NPO MIVF: N/A Pertinent medications: norepinephrine  gtt, precedex  gtt  Goal of Therapy: Electrolytes WNL  Plan:  K 3.1: Kcl 10mEq IV x 3 Check BMP, Mg, Phos with AM labs  Thank you for allowing pharmacy to be a part of this patient's care.  [name] A [name], PharmD Clinical Pharmacist 02/13/2024 7:33 AM

## 2024-02-13 NOTE — Plan of Care (Signed)
Gerald Stabs RN

## 2024-02-13 NOTE — Progress Notes (Signed)
 Patient was extubated at 1316 to high flow oxygen.

## 2024-02-13 NOTE — Progress Notes (Signed)
 Dr. Isadora made aware of patients temp being 101F. No new orders given.

## 2024-02-14 ENCOUNTER — Ambulatory Visit: Payer: Self-pay | Admitting: Student in an Organized Health Care Education/Training Program

## 2024-02-14 DIAGNOSIS — J9601 Acute respiratory failure with hypoxia: Secondary | ICD-10-CM | POA: Diagnosis not present

## 2024-02-14 DIAGNOSIS — G928 Other toxic encephalopathy: Secondary | ICD-10-CM | POA: Diagnosis not present

## 2024-02-14 DIAGNOSIS — J189 Pneumonia, unspecified organism: Secondary | ICD-10-CM

## 2024-02-14 DIAGNOSIS — A492 Hemophilus influenzae infection, unspecified site: Secondary | ICD-10-CM

## 2024-02-14 LAB — BASIC METABOLIC PANEL WITH GFR
Anion gap: 8 (ref 5–15)
BUN: 23 mg/dL — ABNORMAL HIGH (ref 6–20)
CO2: 24 mmol/L (ref 22–32)
Calcium: 8.3 mg/dL — ABNORMAL LOW (ref 8.9–10.3)
Chloride: 111 mmol/L (ref 98–111)
Creatinine, Ser: 0.72 mg/dL (ref 0.44–1.00)
GFR, Estimated: >60 mL/min (ref >60)
Glucose, Bld: 116 mg/dL — ABNORMAL HIGH (ref 70–99)
Potassium: 3.5 mmol/L (ref 3.5–5.1)
Sodium: 143 mmol/L (ref 135–145)

## 2024-02-14 LAB — PHOSPHORUS: Phosphorus: 2.6 mg/dL (ref 2.5–4.6)

## 2024-02-14 LAB — GLUCOSE, CAPILLARY
Glucose-Capillary: 113 mg/dL — ABNORMAL HIGH (ref 70–99)
Glucose-Capillary: 86 mg/dL (ref 70–99)

## 2024-02-14 LAB — MAGNESIUM: Magnesium: 2 mg/dL (ref 1.7–2.4)

## 2024-02-14 MED ORDER — FOLIC ACID 1 MG PO TABS: 1.0000 mg | ORAL_TABLET | Freq: Every day | ORAL | Status: DC

## 2024-02-14 MED ORDER — THIAMINE MONONITRATE 100 MG PO TABS: 100.0000 mg | ORAL_TABLET | Freq: Every day | ORAL | Status: DC

## 2024-02-14 MED ORDER — INSULIN ASPART 100 UNIT/ML IJ SOLN: 0.0000 [IU] | Freq: Three times a day (TID) | INTRAMUSCULAR | Status: DC

## 2024-02-14 MED ORDER — AMOXICILLIN-POT CLAVULANATE 875-125 MG PO TABS: 1.0000 | ORAL_TABLET | Freq: Two times a day (BID) | ORAL | 0 refills | Status: DC

## 2024-02-14 MED ORDER — CHLORHEXIDINE GLUCONATE CLOTH 2 % EX PADS: 6.0000 | MEDICATED_PAD | Freq: Every day | CUTANEOUS | Status: DC

## 2024-02-14 MED ORDER — VENLAFAXINE HCL ER 75 MG PO CP24: 150.0000 mg | ORAL_CAPSULE | Freq: Every day | ORAL | Status: DC

## 2024-02-14 MED ORDER — INSULIN ASPART 100 UNIT/ML IJ SOLN: 0.0000 [IU] | Freq: Every day | INTRAMUSCULAR | Status: DC

## 2024-02-14 MED ORDER — IPRATROPIUM-ALBUTEROL 0.5-2.5 (3) MG/3ML IN SOLN: 3.0000 mL | Freq: Four times a day (QID) | RESPIRATORY_TRACT | Status: DC | PRN

## 2024-02-14 NOTE — TOC Transition Note (Signed)
 Transition of Care [facility]) - Discharge Note   Patient Details  Name: Andrea Sanford MRN: [id number] Date of Birth: 1970-08-17  Transition [facility]) CM/SW Contact:  Corrie JINNY Ruts, LCSW Phone Number: 02/14/2024, 10:57 AM   Clinical Narrative:    Chart reviewed. Spoke with patient. Patient is leaving AMA. TOC attempted to have a conversation on safe transition and patient did not want to discuss. TOC provided shelter resources. TOC also inquired about contacting the patients mother and the patient declined. Patient was provided paper scrubs and patient was escorted out.   TOC is signing off.         Patient Goals and CMS Choice            Discharge Placement                       Discharge Plan and Services Additional resources added to the After Visit Summary for                                       Social Drivers of Health (SDOH) Interventions SDOH Screenings   Food Insecurity: Patient Unable To Answer (08/17/2023)  Housing: Patient Unable To Answer (08/17/2023)  Transportation Needs: Unmet Transportation Needs (11/22/2023)  Utilities: Patient Unable To Answer (08/17/2023)  Alcohol Screen: Low Risk  (06/13/2023)  Depression (PHQ2-9): High Risk (09/29/2023)  Financial Resource Strain: High Risk (08/29/2017)  Physical Activity: Inactive (08/29/2017)  Social Connections: Moderately Isolated (08/29/2017)  Stress: Stress Concern Present (08/29/2017)  Tobacco Use: High Risk (02/11/2024)     Readmission Risk Interventions     No data to display

## 2024-02-14 NOTE — Discharge Summary (Signed)
 Physician Discharge Summary  Patient ID: Andrea Sanford MRN: [id number] DOB/AGE: [birth year] 53 y.o.  Admit date: 02/11/2024 Discharge date: 02/14/2024    Discharge Diagnoses:  Acute toxic metabolic encephalopathy secondary to drug overdose and ETOH intoxication  Polysubstance Abuse  Depression  Bipolar 1 Disorder  Acute Respiratory Failure  Pneumonia  COPD OSA Asthma  Type II Diabetes Mellitus                DISCHARGE SUMMARY   Andrea Sanford is a 53 y.o. y/o female with a PMH of polysubstance abuse, bipolar disorder, HTN, acute hypoxic respiratory failure requiring tracheostomy and G-tube placement following drug overdose. She presented to [facility] ER on 07/20 via EMS from [facility] in [place] after being found unresponsive. Per ED notes pt was found covered in feces with last known normal at 04:00 am on 02/11/24. Upon review of pts medical record pt attempted to schedule a virtual visit for evaluation of shortness of breath on 02/07/24. However, when she was informed it would be better for her to be evaluated in person the pt hung of the telephone.   ED Course  Upon arrival to the ER pt remained unresponsive and received 2 mg of iv narcan  without improvement in mentation requiring mechanical intubation for airway protection. Pt hypotensive post intubation and received 1L NS, but remained hypotensive requiring levophed  gtt. Significant lab results were: glucose 154/BUN 22/wbc 11.1/UA negative for UTI. CXR negative for acute cardiopulmonary abnormality. CT Head negative for acute intracranial abnormality. PCCM team contacted for ICU admission. Pt left AGAINST MEDICAL ADVICE ON 02/14/2024.See detailed hospital course below under significant events.   HOSPITAL COURSE:  02/11/24: Admitted with acute metabolic encephalopathy suspect secondary to drug overdose requiring mechanical intubation for airway protection  01/2224: Pt successfully extubated to [facility] requiring precedex  gtt due  to agitation.  Pt febrile restarted abx therapy zosyn  and azithromycin  02/14/24: Pts alert and oriented but agitated.  No longer requiring precedex  gtt.  Pt states she is leaving the hospital.  I informed the pt she is HIGH risk for Cardiac Arrest and Sudden Death if she leaves the hospital Against Medical Advice.  Informed her we are currently treating her for a possible infection likely pneumonia and not ready for discharge.  Pt stated she understood she would be leaving Against Medical Advice and she is High Risk For Cardiac Arrest and Sudden Death   ANTIBIOTICS Anti-infectives (From admission, onward)    Start     Dose/Rate Route Frequency Ordered Stop   02/13/24 2000  piperacillin -tazobactam (ZOSYN ) IVPB 3.375 g        3.375 g 12.5 mL/hr over 240 Minutes Intravenous Every 8 hours 02/13/24 1831     02/13/24 1100  cefTRIAXone  (ROCEPHIN ) 2 g in sodium chloride  0.9 % 100 mL IVPB  Status:  Discontinued        2 g 200 mL/hr over 30 Minutes Intravenous Every 24 hours 02/13/24 1001 02/13/24 1824   02/13/24 1100  azithromycin  (ZITHROMAX ) 500 mg in sodium chloride  0.9 % 250 mL IVPB        500 mg 250 mL/hr over 60 Minutes Intravenous Every 24 hours 02/13/24 1001        CONSULTS Intensivist   Discharge Exam: General: Acutely-ill appearing female, resting in bed, in NAD on RA  Neuro: A&O x 3, non-focal.  HEENT: [place]/AT. PERRL, sclerae anicteric. Cardiovascular: RRR, no M/R/G, 2+ radial/2+ distal pulses   Lungs: Respirations even and unlabored.  CTA bilaterally, No W/R/R.  Abdomen:  BS x 4, soft, NT/ND.  Musculoskeletal: No gross deformities, no edema.  Skin: Intact, warm, no rashes.   Vitals:   02/14/24 0722 02/14/24 0737 02/14/24 0812 02/14/24 1030  BP: 107/67  119/85 (!) 145/105  Pulse: 78  64 89  Resp: 14  19 (!) 27  Temp: 98.1 F (36.7 C)  97.7 F (36.5 C)   TempSrc: Bladder  Bladder   SpO2: 99% 97% 90% 92%  Weight:      Height:         Discharge Labs  BMET Recent Labs   Lab 02/11/24 1218 02/12/24 0541 02/13/24 0323 02/14/24 0348  NA 140 144 145 143  K 3.8 3.1* 3.3* 3.5  CL 104 110 110 111  CO2 24 23 25 24   GLUCOSE 154* 113* 139* 116*  BUN 22* 22* 32* 23*  CREATININE 0.86 0.76 0.93 0.72  CALCIUM  9.9 9.2 9.0 8.3*  MG  --  2.0 2.2 2.0  PHOS  --  3.4 3.0 2.6    CBC Recent Labs  Lab 02/11/24 1218 02/12/24 0541  HGB 16.0* 14.0  HCT 48.2* 42.2  WBC 11.1* 10.2  PLT 224 194    Anti-Coagulation Recent Labs  Lab 02/11/24 1218  INR 1.0          Allergies as of 02/14/2024   No Known Allergies      Medication List     STOP taking these medications    Accu-Chek Guide Me w/Device Kit   Accu-Chek Softclix Lancets lancets   amLODipine  5 MG tablet Commonly known as: NORVASC    ARTHRITIS STRENGTH BC POWDER PO   atorvastatin  40 MG tablet Commonly known as: LIPITOR   baclofen  10 MG tablet Commonly known as: LIORESAL    Blood Glucose Monitoring Suppl Devi   Ca Alginate-Carboxymethylcell Pads   clonazePAM  1 MG tablet Commonly known as: KLONOPIN    gabapentin  800 MG tablet Commonly known as: Neurontin    losartan  25 MG tablet Commonly known as: COZAAR    meloxicam  15 MG tablet Commonly known as: MOBIC    montelukast  10 MG tablet Commonly known as: SINGULAIR    nystatin  powder Commonly known as: MYCOSTATIN /NYSTOP    omeprazole  40 MG capsule Commonly known as: PRILOSEC   potassium chloride  20 MEQ packet Commonly known as: [place]-Con    QUEtiapine  400 MG tablet Commonly known as: SEROQUEL    venlafaxine  XR 150 MG 24 hr capsule Commonly known as: EFFEXOR -XR         Disposition: Pt Left Against Medical Advise   Discharged Condition: Demiana Crumbley Makki   Lonell Moose, AGNP  Pulmonary/Critical Care Pager [phone] (please enter 7 digits) PCCM Consult Pager [phone] (please enter 7 digits)

## 2024-02-14 NOTE — Progress Notes (Signed)
 Patient's respiratory culture grew H. Influenza. She was admitted to the ICU at [facility] and left AGAINST MEDICAL ADVICE today. Cultures returned with H. Influenza, and I will send a prescription for Augmentin  to her pharmacy. I called the patient and informed her, and she is aware to pick up the prescription.

## 2024-02-14 NOTE — Progress Notes (Addendum)
 0800 Pt is refusing blood sugar check this morning. 9089 pt is screaming at nursing staff because her food has not being deliver, RN called dinning services and notify pt food tray is on the way up.  1020 pt undo her IVF fluids from her IV and states she doesn't want them. Pt continues to refuse medications and vital signs.

## 2024-02-14 NOTE — Progress Notes (Signed)
 Pt sign AMA form, MD notified.  Pt's belonging return to pt.

## 2024-02-14 NOTE — Progress Notes (Signed)
 NAME:  Andrea Sanford, MRN:  [id number], DOB:  November 15, 1970, LOS: 3 ADMISSION DATE:  02/11/2024, CONSULTATION DATE: 02/11/2024 REFERRING MD: Dr. Jossie, CHIEF COMPLAINT: Unresponsiveness    History of Present Illness:  This is a 53 yo female with PMH of polysubstance abuse, bipolar disorder, HTN, acute hypoxic respiratory failure requiring tracheostomy and G-tube placement following drug overdose.  She presented to [facility] ER on 07/20 via EMS from [facility] in [place] after being found unresponsive.  Per ED notes pt was found covered in feces with last known normal at 04:00 am on 07/20.  Upon review of pts medical record pt attempted to schedule a virtual visit for evaluation of shortness of breath on 07/16.  However, when she was informed it would be better for her to be evaluated in person the pt hung of the telephone.    ED Course  Upon arrival to the ER pt remained unresponsive and received 2 mg of iv narcan  without improvement in mentation requiring mechanical intubation for airway protection.  Pt hypotensive post intubation and received 1L NS, but remained hypotensive requiring levophed  gtt.  Significant lab results were: glucose 154/BUN 22/wbc 11.1/UA negative for UTI.   CXR negative for acute cardiopulmonary abnormality.  CT Head negative for acute intracranial abnormality.  PCCM team contacted for ICU admission.   CT Head 07/20: No evidence of an acute intracranial abnormality. Unchanged small focus of chronic encephalomalacia/gliosis within the anterior left upper lobe, likely post-traumatic in etiology.  Background mild cerebral white matter disease, nonspecific but most often secondary to chronic small vessel ischemia.  Mild-to- moderate polypoid mucosal thickening within the right maxillary sinus.   Pertinent  Medical History  Cocaine Abuse Allergy Anxiety & Depression Arthritis Asthma Bipolar Disorder Cancer COPD & OSA T2DM HTN Neuromuscular  disorder Osteoporosis  Significant Hospital Events: Including procedures, antibiotic start and stop dates in addition to other pertinent events   07/20: Admitted with acute metabolic encephalopathy suspect secondary to drug overdose requiring mechanical intubation for airway protection  07/22: Pt successfully extubated to [facility] requiring precedex  gtt due to agitation  07/23: Pts alert and oriented but agitated.  No longer requiring precedex  gtt.  Pt states she is leaving the hospital.  I informed the pt she is HIGH risk for Cardiac Arrest and Sudden Death if she leaves the hospital Against Medical Advice.  Informed her we are currently treating her for a possible infection likely pneumonia and not ready for discharge.  Pt stated she understood she would be leaving Against Medical Advice and she is High Risk For Cardiac Arrest and Sudden Death  Interim History / Subjective:  Pt sedated and mechanically intubated.  She is hypotensive requiring levophed  gtt @4  mcg/min due to hypotension   Objective    Blood pressure (!) 145/105, pulse 89, temperature 97.7 F (36.5 C), temperature source Bladder, resp. rate (!) 27, height 5' 6 (1.676 m), weight 79.3 kg, SpO2 92%.    Vent Mode: PSV;CPAP FiO2 (%):  [28 %-60 %] 30 % PEEP:  [5 cmH20] 5 cmH20 Pressure Support:  [5 cmH20] 5 cmH20   Intake/Output Summary (Last 24 hours) at 02/14/2024 1048 Last data filed at 02/14/2024 0816 Gross per 24 hour  Intake 2686.98 ml  Output 1425 ml  Net 1261.98 ml   Filed Weights   02/12/24 0455 02/13/24 0509 02/14/24 0500  Weight: 80.4 kg 78.4 kg 79.3 kg    Examination: General: Acutely-ill appearing female, NAD on RA  HENT: Supple, no JVD  Lungs: Faint  rhonchi throughout, even, non labored  Cardiovascular: NSR, s1s2, no m/r/g, 2+ radial/2+ distal pulses, no edema  Abdomen: +BS x4, obese, soft, non distended, non tender   Extremities: Normal bulk and tone Neuro: Alert and oriented, following commands, PERRLA   GU: Orders placed to discontinue foley   Resolved problem list  ETOH Intoxication  Mechanical intubation  Acute toxic metabolic encephalopathy   Assessment and Plan   #Anxiety  #Polysubstance abuse  #Bipolar 1 disorder  Hx: Anxiety, ETOH abuse, polysubstance abuse, anxiety, depression, and bipolar 1 disorder - Avoid sedating medications as able  - Resume outpatient clonazepam , gabapentin , and venlafaxine  - Continue seroquel   - Polysubstance and ETOH cessation counseling provided  - Continue folic acid , mvi, and thiamine    #Hypotension secondary to sedating medications~Resolved   Echo: 08/17/23:  LVEF 60-65%, normal diastolic parameters, RV systolic function normal  - Continuous telemetry monitoring  - Continue maintenance iv fluids  - Maintain map 65 or higher  - Troponin negative  - Hold outpatient antihypertensives for now   #Acute respiratory failure secondary to pneumonia  Hx: COPD, OSA, previous tracheostomy, and asthma  - Supplemental O2 for dyspnea and/or hypoxia  - Maintain O2 sats 88% to 92% - Prn bronchodilator therapy   #Pneumonia  - Trend WBC and monitor fever curve  - Follow cultures  - Continue azithromycin  and zosyn  pending culture results and sensitivities    #Type II diabetes mellitus  - CBG's ac/hs - SSI  - Target CBG readings 140 to 180 - Follow hyper/hypoglycemic protocol   Best Practice (right click and Reselect all SmartList Selections daily)   Diet/type: Regular Consistency  DVT prophylaxis subcutaneous lovenox   Pressure ulcer(s): N/A GI prophylaxis: H2B Lines: N/A Foley:  Orders placed to discontinue  Code Status:  full code Last date of multidisciplinary goals of care discussion [02/14/2024]  Labs   CBC: Recent Labs  Lab 02/11/24 1218 02/12/24 0541  WBC 11.1* 10.2  NEUTROABS 8.2*  --   HGB 16.0* 14.0  HCT 48.2* 42.2  MCV 89.6 90.0  PLT 224 194    Basic Metabolic Panel: Recent Labs  Lab 02/11/24 1218 02/12/24 0541  02/13/24 0323 02/14/24 0348  NA 140 144 145 143  K 3.8 3.1* 3.3* 3.5  CL 104 110 110 111  CO2 24 23 25 24   GLUCOSE 154* 113* 139* 116*  BUN 22* 22* 32* 23*  CREATININE 0.86 0.76 0.93 0.72  CALCIUM  9.9 9.2 9.0 8.3*  MG  --  2.0 2.2 2.0  PHOS  --  3.4 3.0 2.6   GFR: Estimated Creatinine Clearance: 85.4 mL/min (by C-G formula based on SCr of 0.72 mg/dL). Recent Labs  Lab 02/11/24 1218 02/12/24 0541  WBC 11.1* 10.2    Liver Function Tests: Recent Labs  Lab 02/11/24 1218  AST 23  ALT 14  ALKPHOS 92  BILITOT 1.1  PROT 7.8  ALBUMIN 4.2   No results for input(s): LIPASE, AMYLASE in the last 168 hours. No results for input(s): AMMONIA in the last 168 hours.  ABG    Component Value Date/Time   PHART 7.48 (H) 02/12/2024 0335   PCO2ART 34 02/12/2024 0335   PO2ART 75 (L) 02/12/2024 0335   HCO3 25.3 02/12/2024 0335   ACIDBASEDEF 0.7 02/11/2024 1247   O2SAT 95.5 02/12/2024 0335     Coagulation Profile: Recent Labs  Lab 02/11/24 1218  INR 1.0    Cardiac Enzymes: No results for input(s): CKTOTAL, CKMB, CKMBINDEX, TROPONINI in the last 168 hours.  HbA1C:  Hemoglobin A1C  Date/Time Value Ref Range Status  10/03/2023 09:43 AM 6.6 (A) 4.0 - 5.6 % Final   Hgb A1c MFr Bld  Date/Time Value Ref Range Status  09/14/2023 06:18 AM 6.8 (H) 4.8 - 5.6 % Final    Comment:    (NOTE) Pre diabetes:          5.7%-6.4%  Diabetes:              >6.4%  Glycemic control for   <7.0% adults with diabetes   06/13/2023 02:40 PM 6.3 (H) 4.8 - 5.6 % Final    Comment:             Prediabetes: 5.7 - 6.4          Diabetes: >6.4          Glycemic control for adults with diabetes: <7.0     CBG: Recent Labs  Lab 02/13/24 1618 02/13/24 2045 02/13/24 2319 02/14/24 0347 02/14/24 0814  GLUCAP 87 93 108* 113* 86    Review of Systems: Positives in BOLD  Gen: Denies fever, chills, weight change, fatigue, night sweats HEENT: Denies blurred vision, double vision,  hearing loss, tinnitus, sinus congestion, rhinorrhea, sore throat, neck stiffness, dysphagia PULM: Denies shortness of breath, cough, sputum production, hemoptysis, wheezing CV: Denies chest pain, edema, orthopnea, paroxysmal nocturnal dyspnea, palpitations GI: Denies abdominal pain, nausea, vomiting, diarrhea, hematochezia, melena, constipation, change in bowel habits GU: Denies dysuria, hematuria, polyuria, oliguria, urethral discharge Endocrine: Denies hot or cold intolerance, polyuria, polyphagia or appetite change Derm: Denies rash, dry skin, scaling or peeling skin change Heme: Denies easy bruising, bleeding, bleeding gums Neuro: Denies headache, numbness, weakness, slurred speech, loss of memory or consciousness  Past Medical History:  She,  has a past medical history of Allergy, Anxiety, Arthritis, Asthma, Bipolar 1 disorder (HCC), Cancer (HCC), COPD (chronic obstructive pulmonary disease) (HCC), Depression, Diabetes mellitus without complication (HCC), Drug abuse (HCC), Hypertension, Neuromuscular disorder (HCC), Osteoporosis, and Sleep apnea.   Surgical History:   Past Surgical History:  Procedure Laterality Date   ANKLE SURGERY Right    IR GASTROSTOMY TUBE MOD SED  09/04/2023   IR GASTROSTOMY TUBE REMOVAL  12/13/2023   IR PATIENT EVAL TECH 0-60 MINS  11/03/2023   PERIPHERAL VASCULAR THROMBECTOMY Left 1991   TRACHEOSTOMY TUBE PLACEMENT N/A 09/01/2023   Procedure: TRACHEOSTOMY;  Surgeon: Blair Mt, MD;  Location: [facility] ORS;  Service: ENT;  Laterality: N/A;     Social History:   reports that she has been smoking cigarettes. She has been exposed to tobacco smoke. She has never used smokeless tobacco. She reports that she does not currently use alcohol after a past usage of about 2.0 standard drinks of alcohol per week. She reports current drug use. Drug: Cocaine.   Family History:  Her family history includes Cancer in her father; Depression in her mother; Diabetes in her mother.    Allergies No Known Allergies   Home Medications  Prior to Admission medications   Medication Sig Start Date End Date Taking? Authorizing Provider  Accu-Chek Softclix Lancets lancets 1 each by Other route 2 (two) times daily. 11/03/23   [provider]  amLODipine  (NORVASC ) 5 MG tablet Take 1 tablet (5 mg total) by mouth daily. 12/08/23   Ziglar, Susan K, MD  Aspirin-Salicylamide-Caffeine  (ARTHRITIS STRENGTH BC POWDER PO) Take by mouth. Takes 2 in the morning    [provider]  atorvastatin  (LIPITOR) 40 MG tablet Take 1 tablet (40 mg total) by mouth daily.  12/08/23   Ziglar, Susan K, MD  baclofen  (LIORESAL ) 10 MG tablet Take 1 tablet (10 mg total) by mouth 3 (three) times daily. 01/18/24   Ziglar, Susan K, MD  Blood Glucose Monitoring Suppl (ACCU-CHEK GUIDE ME) w/Device KIT Place 1 each onto the skin 2 (two) times daily. 11/03/23   [provider]  Blood Glucose Monitoring Suppl DEVI 1 each by Does not apply route in the morning, at noon, and at bedtime. May substitute to any manufacturer covered by patient's insurance. 11/03/23   Ziglar, Susan K, MD  Ca Alginate-Carboxymethylcell PADS Apply 1 each topically daily. 09/29/23   Donzella Lauraine SAILOR, DO  clonazePAM  (KLONOPIN ) 1 MG tablet Take 1 mg by mouth 2 (two) times daily as needed for anxiety.    [provider]  gabapentin  (NEURONTIN ) 800 MG tablet Take 1 tablet (800 mg total) by mouth 3 (three) times daily. 12/08/23   Ziglar, Susan K, MD  losartan  (COZAAR ) 25 MG tablet Take 1 tablet (25 mg total) by mouth daily. 12/08/23   Ziglar, Susan K, MD  meloxicam  (MOBIC ) 15 MG tablet Take 1 tablet (15 mg total) by mouth daily. 01/18/24   Ziglar, Susan K, MD  montelukast  (SINGULAIR ) 10 MG tablet Take 1 tablet (10 mg total) by mouth at bedtime. 12/08/23   Ziglar, Susan K, MD  nystatin  (MYCOSTATIN /NYSTOP ) powder Apply 1 Application topically 3 (three) times daily. 01/18/24   Ziglar, Susan K, MD  omeprazole  (PRILOSEC) 40 MG capsule  Take 1 capsule (40 mg total) by mouth daily. 01/18/24   Ziglar, Susan K, MD  potassium chloride  (KLOR-CON ) 20 MEQ packet Take 20 mEq by mouth 2 (two) times daily for 5 days. 09/26/23 01/18/24  Charlene Debby BROCKS, PA-C  QUEtiapine  (SEROQUEL ) 400 MG tablet Take 1 tablet (400 mg total) by mouth at bedtime. 01/29/24   Ziglar, Susan K, MD  venlafaxine  XR (EFFEXOR -XR) 150 MG 24 hr capsule Take 1 capsule (150 mg total) by mouth every morning. 01/29/24   Ziglar, Susan K, MD     Critical care time: 45 minutes       Lonell Moose, AGNP  Pulmonary/Critical Care Pager [phone] (please enter 7 digits) PCCM Consult Pager [phone] (please enter 7 digits)

## 2024-02-14 NOTE — Consult Note (Signed)
 PHARMACY CONSULT NOTE - ELECTROLYTES  Pharmacy Consult for Electrolyte Monitoring and Replacement   Recent Labs: Height: 5' 6 (167.6 cm) Weight: 79.3 kg (174 lb 13.2 oz) IBW/kg (Calculated) : 59.3 Estimated Creatinine Clearance: 85.4 mL/min (by C-G formula based on SCr of 0.72 mg/dL). Potassium (mmol/L)  Date Value  02/14/2024 3.5  10/15/2012 3.0 (L)   Magnesium  (mg/dL)  Date Value  92/76/7974 2.0  10/04/2011 1.4 (L)   Calcium  (mg/dL)  Date Value  92/76/7974 8.3 (L)   Calcium , Total (mg/dL)  Date Value  96/75/7985 8.3 (L)   Albumin (g/dL)  Date Value  92/79/7974 4.2  10/03/2023 4.0  10/15/2012 3.9   Phosphorus (mg/dL)  Date Value  92/76/7974 2.6   Sodium (mmol/L)  Date Value  02/14/2024 143  10/03/2023 139  10/15/2012 141    Assessment  Andrea Sanford is a 53 y.o. female presenting to [facility] after being found unresponsive. PMH significant forpolysubstance abuse, bipolar disorder, HTN, acute hypoxic respiratory failure requiring tracheostomy and G-tube placement following drug overdose. Pharmacy has been consulted to monitor and replace electrolytes while under PCCM care.  Diet: NPO MIVF: N/A Pertinent medications: norepinephrine  gtt, precedex  gtt  Goal of Therapy: Electrolytes WNL  Plan:  No replacement indicated Check BMP, Mg, Phos with AM labs  Thank you for allowing pharmacy to be a part of this patient's care.  Desmon Hitchner A Tangela Dolliver, PharmD Clinical Pharmacist 02/14/2024 7:47 AM

## 2024-02-15 DIAGNOSIS — Z0189 Encounter for other specified special examinations: Secondary | ICD-10-CM

## 2024-02-15 LAB — GLUCOSE, POCT (MANUAL RESULT ENTRY): POC Glucose: 182 mg/dL — AB (ref 70–99)

## 2024-02-15 NOTE — Congregational Nurse Program (Signed)
  Dept: [phone]   Congregational Nurse Program Note  Date of Encounter: 02/15/2024 Client to [facility] day center, nurse led clinic,with request for blood sugar check. Non-fasting blood sugar 182. Client unclear regarding antidiabetic medications. Foot care also provided, calluses filed, lotion applied. No wounds or blisters noted. MARLA Marina BSN, RN Past Medical History: Past Medical History:  Diagnosis Date   Allergy    Anxiety    Arthritis    Asthma    Bipolar 1 disorder (HCC)    Cancer (HCC)    COPD (chronic obstructive pulmonary disease) (HCC)    Depression    Diabetes mellitus without complication (HCC)    Drug abuse (HCC)    Hypertension    Neuromuscular disorder (HCC)    Osteoporosis    Sleep apnea     Encounter Details:  Community Questionnaire - 02/15/24 1236       Questionnaire   Ask client: Do you give verbal consent for me to treat you today? Yes    Student Assistance N/A    Location Patient Served  [facility]    Encounter Setting CN site    Population Status Unhoused    Insurance Medicaid    Insurance/Financial Assistance Referral N/A    Medication Have Medication Insecurities    Medical Provider Yes    Screening Referrals Made N/A    Medical Referrals Made N/A    Medical Appointment Completed N/A    CNP Interventions Advocate/Support;Educate;Navigate Healthcare System;Case Management    Screenings CN Performed Blood Glucose    ED Visit Averted N/A    Life-Saving Intervention Made N/A

## 2024-02-16 ENCOUNTER — Other Ambulatory Visit: Payer: Self-pay | Admitting: Family Medicine

## 2024-02-16 ENCOUNTER — Ambulatory Visit: Payer: Self-pay

## 2024-02-16 DIAGNOSIS — A492 Hemophilus influenzae infection, unspecified site: Secondary | ICD-10-CM

## 2024-02-16 LAB — CULTURE, RESPIRATORY W GRAM STAIN

## 2024-02-16 MED ORDER — AMOXICILLIN-POT CLAVULANATE 875-125 MG PO TABS: 1.0000 | ORAL_TABLET | Freq: Two times a day (BID) | ORAL | 0 refills | Status: AC

## 2024-02-16 NOTE — Telephone Encounter (Signed)
 FYI Only or Action Required?: FYI only for provider.  Patient was last seen in primary care on 01/18/2024 by Ziglar, Susan K, MD.  Called Nurse Triage reporting Pain.  Symptoms began about a month ago.  Interventions attempted: Nothing.  Symptoms are: unchanged.  Triage Disposition: See PCP When Office is Open (Within 3 Days)  Patient/caregiver understands and will follow disposition?: Yes   Copied from CRM #[id number]. Topic: Clinical - Red Word Triage >> Feb 16, 2024  4:16 PM [name] wrote: Red Word that prompted transfer to Nurse Triage:   Swollen legs and feet, painful muscles. Reason for Disposition  Cough has been present for > 3 weeks    Pt already has appt on MOnday  Answer Assessment - Initial Assessment Questions 1. ONSET: When did the pain start?      Years and worsening 2. LOCATION: Where is the pain located?      Bilateral leg pain & feet burning 3. PAIN: How bad is the pain?    (Scale 1-10; or mild, moderate, severe)     10/10 4. WORK OR EXERCISE: Has there been any recent work or exercise that involved this part of the body?      no 5. CAUSE: What do you think is causing the leg pain?     na 6. OTHER SYMPTOMS: Do you have any other symptoms? (e.g., chest pain, back pain, breathing difficulty, swelling, rash, fever, numbness, weakness)     Muscle pain & bilateral leg swelling, leg discoloration 7. PREGNANCY: Is there any chance you are pregnant? When was your last menstrual period?     na  Answer Assessment - Initial Assessment Questions 1. ONSET: When did the cough begin?      X month 2. SEVERITY: How bad is the cough today?      moderate 3. SPUTUM: Describe the color of your sputum (e.g., none, dry cough; clear, white, yellow, green)     Clear with black specks, green 4. HEMOPTYSIS: Are you coughing up any blood? If Yes, ask: How much? (e.g., flecks, streaks, tablespoons, etc.)     At some times 5. DIFFICULTY BREATHING: Are you  having difficulty breathing? If Yes, ask: How bad is it? (e.g., mild, moderate, severe)      At times -mild 6. FEVER: Do you have a fever? If Yes, ask: What is your temperature, how was it measured, and when did it start?     unknown 7. CARDIAC HISTORY: Do you have any history of heart disease? (e.g., heart attack, congestive heart failure)      na 8. LUNG HISTORY: Do you have any history of lung disease?  (e.g., pulmonary embolus, asthma, emphysema)     na 9. PE RISK FACTORS: Do you have a history of blood clots? (or: recent major surgery, recent prolonged travel, bedridden)     na 10. OTHER SYMPTOMS: Do you have any other symptoms? (e.g., runny nose, wheezing, chest pain)       no 11. PREGNANCY: Is there any chance you are pregnant? When was your last menstrual period?       na 12. TRAVEL: Have you traveled out of the country in the last month? (e.g., travel history, exposures)       na  Protocols used: Leg Pain-A-AH, Cough - Acute Productive-A-AH

## 2024-02-16 NOTE — Telephone Encounter (Signed)
 Copied from CRM #[id number]. Topic: Clinical - Medication Refill >> Feb 16, 2024 10:24 AM Rosaria E wrote: Medication: amoxicillin -clavulanate (AUGMENTIN ) 875-125 MG tablet   Says she needs more steroids (?)   Has the patient contacted their pharmacy? Yes (Agent: If no, request that the patient contact the pharmacy for the refill. If patient does not wish to contact the pharmacy document the reason why and proceed with request.) (Agent: If yes, when and what did the pharmacy advise?)  This is the patient's preferred pharmacy:  [facility] - [place], [place] - [address] [place] [address] Phone: [phone] Fax: [phone]  Is this the correct pharmacy for this prescription? Yes If no, delete pharmacy and type the correct one.   Has the prescription been filled recently? Yes  Is the patient out of the medication? Yes  Has the patient been seen for an appointment in the last year OR does the patient have an upcoming appointment? Yes  Can we respond through MyChart? Yes  Agent: Please be advised that Rx refills may take up to 3 business days. We ask that you follow-up with your pharmacy.

## 2024-02-17 ENCOUNTER — Other Ambulatory Visit: Payer: Self-pay | Admitting: Family Medicine

## 2024-02-17 DIAGNOSIS — M62838 Other muscle spasm: Secondary | ICD-10-CM

## 2024-02-19 ENCOUNTER — Telehealth (INDEPENDENT_AMBULATORY_CARE_PROVIDER_SITE_OTHER): Admitting: Family Medicine

## 2024-02-19 DIAGNOSIS — J690 Pneumonitis due to inhalation of food and vomit: Secondary | ICD-10-CM

## 2024-02-19 DIAGNOSIS — J189 Pneumonia, unspecified organism: Secondary | ICD-10-CM

## 2024-02-19 DIAGNOSIS — I10 Essential (primary) hypertension: Secondary | ICD-10-CM

## 2024-02-19 DIAGNOSIS — M542 Cervicalgia: Secondary | ICD-10-CM | POA: Diagnosis not present

## 2024-02-19 MED ORDER — ALBUTEROL SULFATE HFA 108 (90 BASE) MCG/ACT IN AERS: 2.0000 | INHALATION_SPRAY | Freq: Four times a day (QID) | RESPIRATORY_TRACT | 5 refills | Status: DC | PRN

## 2024-02-19 MED ORDER — PREDNISONE 10 MG (48) PO TBPK: ORAL_TABLET | Freq: Every day | ORAL | 0 refills | Status: DC

## 2024-02-19 MED ORDER — AMLODIPINE BESYLATE 5 MG PO TABS: 5.0000 mg | ORAL_TABLET | Freq: Every day | ORAL | 1 refills | Status: DC

## 2024-02-19 MED ORDER — BACLOFEN 10 MG PO TABS: 10.0000 mg | ORAL_TABLET | Freq: Three times a day (TID) | ORAL | 1 refills | Status: DC

## 2024-02-19 NOTE — Assessment & Plan Note (Signed)
 Restarted her amlodipine  5 mg daily for her hypertension.  She has an appointment early in August for recheck of her blood pressure.

## 2024-02-19 NOTE — Assessment & Plan Note (Signed)
 She is going to need a follow-up chest x-ray in a month to rule out underlying pathology.

## 2024-02-19 NOTE — Progress Notes (Signed)
 Established Patient Office Visit  Subjective   Patient ID: Andrea Sanford, female    DOB: [birth year]  Age: 53 y.o. MRN: [id number]  No chief complaint on file.  Virtual Visit via Video Note  I connected with Andrea Sanford on 02/19/24 at  9:50 AM EDT by a video enabled telemedicine application and verified that I am speaking with the correct person using two identifiers.  Location: Patient: Mother's home Provider: Office   I discussed the limitations of evaluation and management by telemedicine and the availability of in person appointments. The patient expressed understanding and agreed to proceed.  History of Present Illness:  Andrea Sanford 53 year old woman with HTN, DMT2 (09/14/2023 A1c 6.8%, diet-controlled), GAD/bipolar disorder/polysubstance abuse (under the care of psychiatry), Hx acute hypoxic respiratory failure requiring endotracheal intubation and G-tube placement (polysubstance overdose) still with G-tube, Cologuard inadequate and needs repeat.    Andrea Sanford was hospitalized 02/11/2024 through 02/14/2024 with acute encephalopathy 2/2 to ETOH intoxication and drug overdose.  She was obtunded and intubated for airway protection.  She had hypotension that required pressors.  As soon as she regained consciousness and was extubated she insisted on leaving AMA.  Her respiratory cultures grew H. influenza she was called by the hospital and asked to take Augmentin .  She did get that prescription filled and has been taking it.  All of her medications were stopped at discharge. She denies being suicidal but advises that if she died with her drug overdose that that would have been okay.  She says she does not care if she dies because her whole body hurts.    She complains of legs and feet pain shoulder and neck pain.  She takes baclofen  10 mg 3 times daily for her neck pain and reports that helps.  She would like that refilled as she does not have any.  She has an appointment with  psychiatry on August 7. She reports she is wheezing and has shortness of breath doing things.  She is on the Augmentin  but she is not on the steroid.  She has a productive cough.  She request a steroid taper today.  I had her check her blood pressure at home and it is 152/100.  Her amlodipine  5mg  was stopped when she left the hospital.       Observations/Objective:   Assessment and Plan:   Follow Up Instructions:    I discussed the assessment and treatment plan with the patient. The patient was provided an opportunity to ask questions and all were answered. The patient agreed with the plan and demonstrated an understanding of the instructions.   The patient was advised to call back or seek an in-person evaluation if the symptoms worsen or if the condition fails to improve as anticipated.  I provided 14 minutes of non-face-to-face time during this encounter.   Andrea MARLA Mock, MD  HPI   Objective:     There were no vitals taken for this visit.   Physical Exam       No results found for any visits on 02/19/24.    The 10-year ASCVD risk score (Arnett DK, et al., 2019) is: 17.1%    Assessment & Plan:  Neck pain -     Baclofen ; Take 1 tablet (10 mg total) by mouth 3 (three) times daily.  Dispense: 90 each; Refill: 1  Primary hypertension Assessment & Plan: Restarted her amlodipine  5 mg daily for her hypertension.  She has an appointment early in August for recheck of  her blood pressure.  Orders: -     amLODIPine  Besylate; Take 1 tablet (5 mg total) by mouth daily.  Dispense: 90 tablet; Refill: 1  Pneumonia due to infectious organism, unspecified laterality, unspecified part of lung Assessment & Plan: She is going to need a follow-up chest x-ray in a month to rule out underlying pathology.  Orders: -     predniSONE ; Take by mouth daily. 12-day taper pack, use as directed for taper  Dispense: 48 tablet; Refill: 0 -     Albuterol  Sulfate HFA; Inhale 2 puffs into the  lungs every 6 (six) hours as needed for wheezing or shortness of breath.  Dispense: 8 g; Refill: 5  Aspiration pneumonia, unspecified aspiration pneumonia type, unspecified laterality, unspecified part of lung (HCC) Assessment & Plan: She is going to need a follow-up chest x-ray in a month to rule out underlying pathology.      Return She has a scheduled appointment already, for BP recheck.    Andrea Bellino K Imogine Carvell, MD

## 2024-02-22 ENCOUNTER — Telehealth: Payer: Self-pay | Admitting: Family Medicine

## 2024-02-22 NOTE — Telephone Encounter (Unsigned)
 Copied from CRM [id number]. Topic: Clinical - Medication Refill >> Feb 22, 2024  3:50 PM [name] wrote: Dawn from [facility] called and requested this to be refilled. The psychiatrist that pt was seeing is no longer at the practice. If Dr Ziglar can't write it ins will not pay for it.   Medication: QUEtiapine  (SEROQUEL ) 400 MG tablet  Has the patient contacted their pharmacy? No   This is the patient's preferred pharmacy:  [facility] - [place], [place] - [address] [place] [address] Phone: [phone] Fax: [phone]  Is this the correct pharmacy for this prescription? Yes If no, delete pharmacy and type the correct one.   Has the prescription been filled recently? Yes  Is the patient out of the medication? Yes  Has the patient been seen for an appointment in the last year OR does the patient have an upcoming appointment? Yes  Can we respond through MyChart? Yes  Agent: Please be advised that Rx refills may take up to 3 business days. We ask that you follow-up with your pharmacy.

## 2024-02-23 ENCOUNTER — Other Ambulatory Visit: Payer: Self-pay | Admitting: Family Medicine

## 2024-02-23 MED ORDER — QUETIAPINE FUMARATE 400 MG PO TABS: 400.0000 mg | ORAL_TABLET | Freq: Every day | ORAL | 3 refills | Status: DC

## 2024-02-26 ENCOUNTER — Telehealth: Payer: Self-pay | Admitting: Family Medicine

## 2024-02-26 DIAGNOSIS — F339 Major depressive disorder, recurrent, unspecified: Secondary | ICD-10-CM

## 2024-02-26 DIAGNOSIS — F411 Generalized anxiety disorder: Secondary | ICD-10-CM

## 2024-02-26 DIAGNOSIS — F314 Bipolar disorder, current episode depressed, severe, without psychotic features: Secondary | ICD-10-CM

## 2024-02-26 MED ORDER — QUETIAPINE FUMARATE 400 MG PO TABS: 400.0000 mg | ORAL_TABLET | Freq: Every day | ORAL | 1 refills | Status: DC

## 2024-02-26 NOTE — Telephone Encounter (Signed)
 Pharmacist states that because patient exceeded the amount of overrides allowed, a prior authorization must be done. PA was initiated byt canceled by plan. No explanation provided.

## 2024-02-26 NOTE — Telephone Encounter (Signed)
 Copied from CRM [id number]. Topic: Clinical - Prescription Issue >> Feb 26, 2024 11:12 AM Tonda B wrote: Reason for CRM: pt calling needing prior authorizatioQUEtiapine (SEROQUEL ) 400 MG tablet [[id number]]w please call  pt [phone] (M

## 2024-02-26 NOTE — Telephone Encounter (Signed)
 Patient informed by the pharmacy prior authorization was denied.  Patient then called insurance [facility]) and informed prior authorization was expired and new one needs to be completed. Patient has intake on 8/7 for new behavioral health provider. Patient needing it filled before then.   Please assist patient further

## 2024-02-26 NOTE — Telephone Encounter (Signed)
 Spoke with [employer] and was able to get appeal  approved. [employer] confirmed they were able to get insurance coverage.

## 2024-02-28 ENCOUNTER — Telehealth: Payer: Self-pay

## 2024-02-28 DIAGNOSIS — Z0000 Encounter for general adult medical examination without abnormal findings: Secondary | ICD-10-CM

## 2024-03-06 ENCOUNTER — Telehealth: Payer: Self-pay

## 2024-03-06 NOTE — Progress Notes (Unsigned)
 Complex Care Management Note Care Guide Note  03/06/2024 Name: Andrea Sanford MRN: [id number] DOB: 1971-02-20   Complex Care Management Outreach Attempts: An unsuccessful telephone outreach was attempted today to offer the patient information about available complex care management services.  Follow Up Plan:  Additional outreach attempts will be made to offer the patient complex care management information and services.   Encounter Outcome:  No Answer  Jeoffrey Buffalo , RMA     [place]  [facility], [facility] Guide  Direct Dial: [phone]  Website: [email or web address]

## 2024-03-14 NOTE — Progress Notes (Signed)
 Complex Care Management Note Care Guide Note  03/14/2024 Name: Andrea Sanford MRN: [id number] DOB: 12/16/1970   Complex Care Management Outreach Attempts: A third unsuccessful outreach was attempted today to offer the patient with information about available complex care management services.  Follow Up Plan:  Additional outreach attempts will be made to offer the patient complex care management information and services.   Encounter Outcome:  No Answer  Jeoffrey Buffalo , RMA     [place]  [facility], [facility] Guide  Direct Dial: [phone]  Website: [email or web address]

## 2024-03-26 ENCOUNTER — Ambulatory Visit: Payer: Self-pay

## 2024-03-26 ENCOUNTER — Ambulatory Visit: Admitting: Family Medicine
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
## 2024-03-27 ENCOUNTER — Emergency Department

## 2024-03-27 ENCOUNTER — Encounter: Payer: Self-pay | Admitting: *Deleted

## 2024-03-27 ENCOUNTER — Other Ambulatory Visit: Payer: Self-pay

## 2024-03-27 DIAGNOSIS — Z5321 Procedure and treatment not carried out due to patient leaving prior to being seen by health care provider: Secondary | ICD-10-CM | POA: Insufficient documentation

## 2024-03-27 DIAGNOSIS — R404 Transient alteration of awareness: Secondary | ICD-10-CM | POA: Diagnosis not present

## 2024-03-27 DIAGNOSIS — J398 Other specified diseases of upper respiratory tract: Secondary | ICD-10-CM | POA: Insufficient documentation

## 2024-03-27 DIAGNOSIS — R1110 Vomiting, unspecified: Secondary | ICD-10-CM | POA: Diagnosis present

## 2024-03-27 DIAGNOSIS — F29 Unspecified psychosis not due to a substance or known physiological condition: Secondary | ICD-10-CM | POA: Diagnosis not present

## 2024-03-27 LAB — BASIC METABOLIC PANEL WITH GFR
Anion gap: 10 (ref 5–15)
BUN: 20 mg/dL (ref 6–20)
CO2: 28 mmol/L (ref 22–32)
Calcium: 8.8 mg/dL — ABNORMAL LOW (ref 8.9–10.3)
Chloride: 103 mmol/L (ref 98–111)
Creatinine, Ser: 0.86 mg/dL (ref 0.44–1.00)
GFR, Estimated: >60 mL/min (ref >60)
Glucose, Bld: 91 mg/dL (ref 70–99)
Potassium: 3.2 mmol/L — ABNORMAL LOW (ref 3.5–5.1)
Sodium: 141 mmol/L (ref 135–145)

## 2024-03-27 LAB — CBC
HCT: 34.7 % — ABNORMAL LOW (ref 36.0–46.0)
Hemoglobin: 11.4 g/dL — ABNORMAL LOW (ref 12.0–15.0)
MCH: 29.8 pg (ref 26.0–34.0)
MCHC: 32.9 g/dL (ref 30.0–36.0)
MCV: 90.6 fL (ref 80.0–100.0)
Platelets: 215 K/uL (ref 150–400)
RBC: 3.83 MIL/uL — ABNORMAL LOW (ref 3.87–5.11)
RDW: 12.7 % (ref 11.5–15.5)
WBC: 7 K/uL (ref 4.0–10.5)
nRBC: 0 % (ref 0.0–0.2)

## 2024-03-27 NOTE — ED Triage Notes (Addendum)
 Pt brought in via ems with trach issues.  Pt reports gray slimy stuff comes out of her trach site periodically.  No drainage now.  No resp distress.   Pt denies any pain.

## 2024-03-27 NOTE — ED Notes (Signed)
 Pt re-enters ED lobby; was called several times with no answer after being brought in by EMS; pt st I was in the bathroom; pt placed back on WR board

## 2024-03-28 ENCOUNTER — Emergency Department
Admission: EM | Admit: 2024-03-28 | Discharge: 2024-03-28 | Discharge status: Against Medical Advice | Attending: Emergency Medicine | Admitting: Emergency Medicine

## 2024-03-28 ENCOUNTER — Emergency Department

## 2024-03-28 LAB — RESP PANEL BY RT-PCR (RSV, FLU A&B, COVID)  RVPGX2
Influenza A by PCR: NEGATIVE
Influenza B by PCR: NEGATIVE
Resp Syncytial Virus by PCR: NEGATIVE
SARS Coronavirus 2 by RT PCR: NEGATIVE

## 2024-03-28 NOTE — ED Notes (Signed)
 Pt upset and muttering to self. Pt then got off the bed and stated I am leaving. Pt seen ambulating to the lobby without assistance. MD made aware

## 2024-03-28 NOTE — ED Notes (Signed)
 Pt pacing in lobby, restless; radiology tech attempted to perform CXR as ordered; reports pt cont to be restless and refuses to hold still for test to be completed

## 2024-03-28 NOTE — ED Notes (Signed)
 Pt escorted out of hospital by security due to her becoming boisterous and cussing at staff when leaving. Pt sts, I can't fucking sleep in the hallway. I'm getting the fuck out of here. Fuck all of ya. Pt continues to yell, swear and mumble as she is escorted out.

## 2024-04-01 ENCOUNTER — Ambulatory Visit: Payer: Self-pay | Admitting: Family Medicine

## 2024-04-01 NOTE — Telephone Encounter (Signed)
 FYI Only or Action Required?: Action required by provider: update on patient condition.  Patient was last seen in primary care on 02/19/2024 by Ziglar, Susan K, MD.  Called Nurse Triage reporting Peripheral Neuropathy.  Symptoms began several months ago.  Triage Disposition: See PCP When Office is Open (Within 3 Days)  Patient/caregiver understands and will follow disposition?: Yes                     Copied from CRM [id number]. Topic: Clinical - Red Word Triage >> Apr 01, 2024  1:27 PM Ivette P wrote: [facility] that prompted transfer to Nurse Triage: unable to stand, nueropathy is killing her, having real hard time being mobile. Reason for Disposition  [1] Numbness or tingling in one or both feet AND [2] is a chronic symptom (recurrent or ongoing AND present > 4 weeks)  Answer Assessment - Initial Assessment Questions This RN scheduled pt for an appointment with PCP on 9/10 as pt states she has to give Medicaid 48 hour notice for transportation so she cannot come tomorrow. This RN told pt she already has an appt on 9/11 but pt states she was trying to come in sooner. This RN cancelled pt's appt for 9/11 upon pt request. This RN educated pt on new-worsening symptoms and when to call back/seek emergent care. Pt verbalized understanding and agrees to plan.  Pt mentioned at end of call she has an increase in mucus at trach site but denies difficulty breathing. It was difficult to understand pt throughout call due to background noise and pt having other conversations. Upon looking at pt chart after call, this RN sees that pt left ED on 9/4 without being treated.    Pt states she has neuropathy which she states Dr. Ziglar knows about Muscle spasms Can't sit for a long period of time on something hard like a bench Everything hurts   SYMPTOM: What is the main symptom you are concerned about? (e.g., weakness, numbness)     Numbness in feet; ongoing issue where can't feel  right foot  ONSET: When did this start? (e.g., minutes, hours, days; while sleeping)     You know how long ago it started; getting worse  Protocols used: Neurologic Deficit-A-AH

## 2024-04-02 ENCOUNTER — Other Ambulatory Visit: Payer: Self-pay | Admitting: *Deleted

## 2024-04-02 ENCOUNTER — Other Ambulatory Visit: Payer: Self-pay | Admitting: Family Medicine

## 2024-04-02 DIAGNOSIS — Z5900 Homelessness unspecified: Secondary | ICD-10-CM

## 2024-04-03 ENCOUNTER — Ambulatory Visit: Admitting: Family Medicine

## 2024-04-03 NOTE — Patient Instructions (Signed)
 Visit Information  Andrea Sanford was given information about Medicaid Managed Care team care coordination services as a part of their [facility] Community Plan Medicaid benefit.   If you would like to schedule transportation through your [facility], please call the following number at least 2 days in advance of your appointment: [phone]   Rides for urgent appointments can also be made after hours by calling Member Services.  Call the Behavioral Health Crisis Line at [phone], at any time, 24 hours a day, 7 days a week. If you are in danger or need immediate medical attention call 911.   Andrea Sanford - following are the goals we discussed in your visit today:   Goals Addressed             This Visit's Progress    VBCI Social Work Care Plan LCSW       Problems:   Lacks knowledge of how to connect   CSW Clinical Goal(s):   Over the next 90 days the Patient will will follow up with the ACT as directed by Social Work.for ongoing mental health and community resource support.  Interventions:  Mental Health:  Evaluation of current treatment plan related to ongoing community and  mental health support  Patient reports being currently homeless and in need of close follow up Referral to ACT (Assertive Community Treatment) discussed  for intensive community based services and support Active listening / Reflection utilized Discussed referral options to ACT-patient agreeable and open to referral, reports having this service in the past-declines [facility] but open to additional alternatives Housing options discussed, patient states that she cannot go to a shelter as she does not have a identification card, she further states that she cannot reside with her mother or any other family CSW will place referral to care guide for housing options Emotional Support Provided PHQ2/PHQ9 completed Suicidal Ideation/Homicidal Ideation assessed:   Patient Goals/Self-Care  Activities:  Collaborate with the community resource care guide for assistance with housing.             Follow up with primary care doctor for ongoing follow up              Expect call from the ACT team for intensive, person-centered community based services Plan:   Telephone follow up appointment with care management team member scheduled for:  04/18/24.         The patient verbalized understanding of instructions, educational materials, and care plan provided today and DECLINED offer to receive copy of patient instructions, educational materials, and care plan.   Licensed Clinical Child psychotherapist will will follow up with patient on   [employer], LCSW [facility]  [facility], Population Health Licensed Clinical Social Worker  Direct Dial: [phone]     Following is a copy of your plan of care:  There are no care plans that you recently modified to display for this patient.

## 2024-04-03 NOTE — Patient Outreach (Signed)
 Complex Care Management   Visit Note  04/03/2024  Name:  Andrea Sanford MRN: [id number] DOB: [birth year]  Situation: Referral received for Complex Care Management related to  Patient is homeless, has challenges with drug use and consistent mental health follow up    I obtained verbal consent from Patient.  Visit completed with Patient  on the phone on 04/02/24  Background:   Past Medical History:  Diagnosis Date   Allergy    Anxiety    Arthritis    Asthma    Bipolar 1 disorder (HCC)    Cancer (HCC)    COPD (chronic obstructive pulmonary disease) (HCC)    Depression    Diabetes mellitus without complication (HCC)    Drug abuse (HCC)    Hypertension    Neuromuscular disorder (HCC)    Osteoporosis    Sleep apnea     Assessment: Patient Reported Symptoms:  Cognitive Cognitive Status: Able to follow simple commands, Difficulties with attention and concentration, Poor judgment in daily scenarios Cognitive/Intellectual Conditions Management [RPT]: None reported or documented in medical history or problem list   Health Maintenance Behaviors: Annual physical exam Healing Pattern: Slow Health Facilitated by: Stress management, Rest  Neurological Neurological Review of Symptoms: Headaches Neurological Management Strategies: Medication therapy Neurological Comment: Been taking BC powders 3x a day for headaches  HEENT HEENT Symptoms Reported: Sudden change or loss of vision HEENT Comment: Needs to follow up with a eye doctor-cannot get glasses until February    Cardiovascular Cardiovascular Symptoms Reported: No symptoms reported    Respiratory Respiratory Symptoms Reported: No symptoms reported    Endocrine Endocrine Symptoms Reported: No symptoms reported Is patient diabetic?: Yes Is patient checking blood sugars at home?: No Endocrine Self-Management Outcome: 3 (uncertain) Endocrine Comment: stopped taking insulin -my mother has it :I don't  have anywhere to put it   Gastrointestinal Gastrointestinal Symptoms Reported: No symptoms reported      Genitourinary Genitourinary Symptoms Reported: No symptoms reported Other Genitourinary Symptoms: Patien states that she sweats alot    Integumentary Integumentary Symptoms Reported: Sweating Additional Integumentary Details: per patient, her Effexor  causes exess sweat Skin Management Strategies: Medication therapy Skin Self-Management Outcome: 3 (uncertain)  Musculoskeletal Musculoskelatal Symptoms Reviewed: Difficulty walking, Limited mobility Additional Musculoskeletal Details: I have no shoes, sores on feet from not having shoes   Falls in the past year?: Yes Number of falls in past year: 2 or more Was there an injury with Fall?: Yes Fall Risk Category Calculator: 3 Patient Fall Risk Level: High Fall Risk Patient at Risk for Falls Due to: Impaired mobility Fall risk Follow up: Falls prevention discussed  Psychosocial Psychosocial Symptoms Reported: Depression - if selected complete PHQ 2-9 Additional Psychological Details: Dr. Ziglar re-filling effexor  and seroquel -referred to [facility]-could not be seen there per patient she needed more help that she can provide Agreeable to ACT team but not [facility] again Behavioral Management Strategies: Medication therapy Major Change/Loss/Stressor/Fears (CP): Resources Techniques to Cope with Loss/Stress/Change: Substance use, Medication Quality of Family Relationships: non-existent Do you feel physically threatened by others?: No    04/03/2024    PHQ2-9 Depression Screening   Little interest or pleasure in doing things More than half the days  Feeling down, depressed, or hopeless Several days  PHQ-2 - Total Score 3  Trouble falling or staying asleep, or sleeping too much More than half the days  Feeling tired or having little energy Several days  Poor appetite or overeating  More than half the days  Feeling bad about yourself - or that you  are a failure or have let yourself or your family down More than half the days  Trouble concentrating on things, such as reading the newspaper or watching television Several days  Moving or speaking so slowly that other people could have noticed.  Or the opposite - being so fidgety or restless that you have been moving around a lot more than usual Not at all  Thoughts that you would be better off dead, or hurting yourself in some way Not at all  PHQ2-9 Total Score 11  If you checked off any problems, how difficult have these problems made it for you to do your work, take care of things at home, or get along with other people    Depression Interventions/Treatment      There were no vitals filed for this visit.  Medications Reviewed Today     Reviewed by Ermalinda Lenn HERO, LCSW (Social Worker) on 04/02/24 at 1206  Med List Status: <None>   Medication Order Taking? Sig Documenting Provider Last Dose Status Informant  albuterol  (VENTOLIN  HFA) 108 (90 Base) MCG/ACT inhaler [id number] Yes Inhale 2 puffs into the lungs every 6 (six) hours as needed for wheezing or shortness of breath. Ziglar, Susan K, MD  Active   amLODipine  (NORVASC ) 5 MG tablet [id number] Yes Take 1 tablet (5 mg total) by mouth daily. Ziglar, Susan K, MD  Active   baclofen  (LIORESAL ) 10 MG tablet [id number] Yes Take 1 tablet (10 mg total) by mouth 3 (three) times daily. Ziglar, Susan K, MD  Active   predniSONE  (STERAPRED UNI-PAK 48 TAB) 10 MG (48) TBPK tablet [id number] Yes Take by mouth daily. 12-day taper pack, use as directed for taper Ziglar, Susan K, MD  Active   QUEtiapine  (SEROQUEL ) 400 MG tablet [id number] Yes Take 1 tablet (400 mg total) by mouth at bedtime. Ziglar, Susan K, MD  Active             Recommendation:   PCP Follow-up ACT referral for intensive person centered community based services and support  Follow Up Plan:   Telephone follow up appointment date/time:  04/18/24  Lenn Ermalinda, LCSW [place]   [facility], [facility] Licensed Clinical Social Worker  Direct Dial: [phone]

## 2024-04-04 ENCOUNTER — Ambulatory Visit: Admitting: Family Medicine

## 2024-04-06 ENCOUNTER — Encounter: Payer: Self-pay | Admitting: Emergency Medicine

## 2024-04-06 ENCOUNTER — Emergency Department
Admission: EM | Admit: 2024-04-06 | Discharge: 2024-04-07 | Disposition: A | Discharge status: Other Acute Inpatient Hospital | Attending: Emergency Medicine | Admitting: Emergency Medicine

## 2024-04-06 ENCOUNTER — Other Ambulatory Visit: Payer: Self-pay

## 2024-04-06 DIAGNOSIS — F29 Unspecified psychosis not due to a substance or known physiological condition: Secondary | ICD-10-CM | POA: Diagnosis not present

## 2024-04-06 DIAGNOSIS — F4310 Post-traumatic stress disorder, unspecified: Secondary | ICD-10-CM | POA: Diagnosis not present

## 2024-04-06 DIAGNOSIS — Z79899 Other long term (current) drug therapy: Secondary | ICD-10-CM | POA: Insufficient documentation

## 2024-04-06 DIAGNOSIS — F1420 Cocaine dependence, uncomplicated: Secondary | ICD-10-CM | POA: Insufficient documentation

## 2024-04-06 DIAGNOSIS — F319 Bipolar disorder, unspecified: Secondary | ICD-10-CM | POA: Diagnosis not present

## 2024-04-06 DIAGNOSIS — F1490 Cocaine use, unspecified, uncomplicated: Secondary | ICD-10-CM | POA: Diagnosis present

## 2024-04-06 DIAGNOSIS — F411 Generalized anxiety disorder: Secondary | ICD-10-CM | POA: Diagnosis present

## 2024-04-06 DIAGNOSIS — F1910 Other psychoactive substance abuse, uncomplicated: Secondary | ICD-10-CM | POA: Insufficient documentation

## 2024-04-06 LAB — CBC
HCT: 41.2 % (ref 36.0–46.0)
Hemoglobin: 13.5 g/dL (ref 12.0–15.0)
MCH: 30 pg (ref 26.0–34.0)
MCHC: 32.8 g/dL (ref 30.0–36.0)
MCV: 91.6 fL (ref 80.0–100.0)
Platelets: 235 K/uL (ref 150–400)
RBC: 4.5 MIL/uL (ref 3.87–5.11)
RDW: 13.2 % (ref 11.5–15.5)
WBC: 10.6 K/uL — ABNORMAL HIGH (ref 4.0–10.5)
nRBC: 0 % (ref 0.0–0.2)

## 2024-04-06 LAB — COMPREHENSIVE METABOLIC PANEL WITH GFR
ALT: 16 U/L (ref 0–44)
AST: 24 U/L (ref 15–41)
Albumin: 4.3 g/dL (ref 3.5–5.0)
Alkaline Phosphatase: 81 U/L (ref 38–126)
Anion gap: 12 (ref 5–15)
BUN: 21 mg/dL — ABNORMAL HIGH (ref 6–20)
CO2: 25 mmol/L (ref 22–32)
Calcium: 9.4 mg/dL (ref 8.9–10.3)
Chloride: 105 mmol/L (ref 98–111)
Creatinine, Ser: 1.21 mg/dL — ABNORMAL HIGH (ref 0.44–1.00)
GFR, Estimated: 53 mL/min — ABNORMAL LOW (ref >60)
Glucose, Bld: 111 mg/dL — ABNORMAL HIGH (ref 70–99)
Potassium: 3 mmol/L — ABNORMAL LOW (ref 3.5–5.1)
Sodium: 142 mmol/L (ref 135–145)
Total Bilirubin: 1.6 mg/dL — ABNORMAL HIGH (ref 0.0–1.2)
Total Protein: 7.5 g/dL (ref 6.5–8.1)

## 2024-04-06 LAB — MAGNESIUM: Magnesium: 2.1 mg/dL (ref 1.7–2.4)

## 2024-04-06 LAB — ETHANOL: Alcohol, Ethyl (B): <15 mg/dL (ref <15)

## 2024-04-06 MED ORDER — POTASSIUM CHLORIDE CRYS ER 20 MEQ PO TBCR
40.0000 meq | EXTENDED_RELEASE_TABLET | Freq: Once | ORAL | Status: AC
Start: 2024-04-06 — End: 2024-04-06
  Administered 2024-04-06: 40 meq via ORAL
  Filled 2024-04-06: qty 2

## 2024-04-06 MED ORDER — PHENOBARBITAL SODIUM 130 MG/ML IJ SOLN
130.0000 mg | Freq: Once | INTRAMUSCULAR | Status: AC
  Administered 2024-04-06: 130 mg via INTRAVENOUS
  Filled 2024-04-06: qty 1

## 2024-04-06 MED ORDER — LACTATED RINGERS IV BOLUS
1000.0000 mL | Freq: Once | INTRAVENOUS | Status: AC
  Administered 2024-04-06: 1000 mL via INTRAVENOUS

## 2024-04-06 NOTE — ED Triage Notes (Signed)
 Patient reports that she wants detox from crack cocaine.  Patient last used yesterday.  Patient normally uses daily, but not every day per report.

## 2024-04-06 NOTE — ED Provider Notes (Signed)
  Physical Exam  BP 117/80   Pulse (!) 120   Temp 98.1 F (36.7 C) (Oral)   Resp 20   Wt 81 kg   SpO2 94%   BMI 28.82 kg/m   Physical Exam  Procedures  Procedures  ED Course / MDM   Clinical Course as of 04/06/24 2312  Sat Apr 06, 2024  2305 S/o: 51F w/ polysubstance use p/w request for detox - treated w/ phenobarb, sleeping - allow to metabolize, re-eval  TO DO: - metabolize, re-eval [MM]    Clinical Course User Index [MM] Clarine Ozell LABOR, MD   Medical Decision Making Amount and/or Complexity of Data Reviewed Labs: ordered.  Risk Prescription drug management.   ***

## 2024-04-06 NOTE — ED Provider Notes (Signed)
 [facility] Provider Note    Event Date/Time   First MD Initiated Contact with Patient 04/06/24 2016     (approximate)   History   Drug / Alcohol Assessment   HPI  Andrea Sanford is a 53 y.o. female who presents to the ED for evaluation of Drug / Alcohol Assessment   I review and ICU DC summary from July.  History of polysubstance abuse, bipolar disorder. Left AMA.   Patient presents to the ED requesting help with anxiety and cocaine use.  Denies any suicidal or homicidal thoughts.   Physical Exam   Triage Vital Signs: ED Triage Vitals [04/06/24 1930]  Encounter Vitals Group     BP 117/80     Girls Systolic BP Percentile      Girls Diastolic BP Percentile      Boys Systolic BP Percentile      Boys Diastolic BP Percentile      Pulse Rate (!) 120     Resp 20     Temp 98.1 F (36.7 C)     Temp Source Oral     SpO2 94 %     Weight 178 lb 9.2 oz (81 kg)     Height      Head Circumference      Peak Flow      Pain Score 10     Pain Loc      Pain Education      Exclude from Growth Chart     Most recent vital signs: Vitals:   04/06/24 1930  BP: 117/80  Pulse: (!) 120  Resp: 20  Temp: 98.1 F (36.7 C)  SpO2: 94%    General: Awake, no distress.  Anxious without delirium CV:  Good peripheral perfusion.  Resp:  Normal effort.  Abd:  No distention.  MSK:  No deformity noted.  Neuro:  No focal deficits appreciated. Other:     ED Results / Procedures / Treatments   Labs (all labs ordered are listed, but only abnormal results are displayed) Labs Reviewed  COMPREHENSIVE METABOLIC PANEL WITH GFR - Abnormal; Notable for the following components:      Result Value   Potassium 3.0 (*)    Glucose, Bld 111 (*)    BUN 21 (*)    Creatinine, Ser 1.21 (*)    Total Bilirubin 1.6 (*)    GFR, Estimated 53 (*)    All other components within normal limits  CBC - Abnormal; Notable for the following components:   WBC 10.6 (*)    All other  components within normal limits  ETHANOL  URINE DRUG SCREEN, QUALITATIVE ([facility] ONLY)  MAGNESIUM     EKG   RADIOLOGY   Official radiology report(s): No results found.  PROCEDURES and INTERVENTIONS:  Procedures  Medications  lactated ringers  bolus 1,000 mL (1,000 mLs Intravenous New Bag/Given 04/06/24 2029)  PHENObarbital  (LUMINAL) injection 130 mg (130 mg Intravenous Given 04/06/24 2029)  potassium chloride  SA (KLOR-CON  M) CR tablet 40 mEq (40 mEq Oral Given 04/06/24 2029)     IMPRESSION / MDM / ASSESSMENT AND PLAN / ED COURSE  I reviewed the triage vital signs and the nursing notes.  Differential diagnosis includes, but is not limited to, polysubstance abuse, acute withdrawals, medication noncompliance, toxidrome  {Patient presents with symptoms of an acute illness or injury that is potentially life-threatening.  Patient with history of polysubstance abuse presents with desire for detoxing from cocaine use.  Reports occasional ethanol use as well.  She has no signs of seizure or DT.  She receives IV phenobarbital  improvement of her symptoms.  Mild metabolic derangements with slight worsening of her renal function and low potassium, replaced orally and IV.  Normal CBC, negative ethanol level.  No signs of neurologic deficits, no evidence of emergent psychiatric conditions to necessitate IVC or emergent psychiatric evaluation.  Signed out to oncoming physician.      FINAL CLINICAL IMPRESSION(S) / ED DIAGNOSES   Final diagnoses:  None     Rx / DC Orders   ED Discharge Orders     None        Note:  This document was prepared using Dragon voice recognition software and may include unintentional dictation errors.   Claudene Rover, MD 04/06/24 [phone]

## 2024-04-06 NOTE — ED Notes (Addendum)
 Patient belongings:   Black sandals, pink shirt, black shorts, black sports bra, lighter, chapstick, 1 black bag, 2 books, 1 grocery bag with bottled water , 1 purple underwear, 3 bracelets, 3 rings, blue jeans, toiletries.

## 2024-04-06 NOTE — ED Notes (Signed)
 Pt unable to provide a urine specimen at the current time.

## 2024-04-06 NOTE — ED Notes (Signed)
 Patient upset that she will not be able to leave and go outside and smoke as she pleases.  Patient states I hope ya'll send me to [facility].  Patient educated about the process and verbalized understanding.

## 2024-04-07 ENCOUNTER — Encounter: Payer: Self-pay | Admitting: Nurse Practitioner

## 2024-04-07 ENCOUNTER — Other Ambulatory Visit (HOSPITAL_COMMUNITY)
Admission: EM | Admit: 2024-04-07 | Discharge: 2024-04-11 | Disposition: A | Discharge status: Home / Self Care | Source: Intra-hospital | Attending: Psychiatry | Admitting: Psychiatry

## 2024-04-07 DIAGNOSIS — R454 Irritability and anger: Secondary | ICD-10-CM | POA: Diagnosis not present

## 2024-04-07 DIAGNOSIS — F411 Generalized anxiety disorder: Secondary | ICD-10-CM | POA: Diagnosis present

## 2024-04-07 DIAGNOSIS — F1990 Other psychoactive substance use, unspecified, uncomplicated: Secondary | ICD-10-CM

## 2024-04-07 DIAGNOSIS — Z5900 Homelessness unspecified: Secondary | ICD-10-CM | POA: Insufficient documentation

## 2024-04-07 DIAGNOSIS — E1142 Type 2 diabetes mellitus with diabetic polyneuropathy: Secondary | ICD-10-CM | POA: Insufficient documentation

## 2024-04-07 DIAGNOSIS — F17210 Nicotine dependence, cigarettes, uncomplicated: Secondary | ICD-10-CM | POA: Diagnosis not present

## 2024-04-07 DIAGNOSIS — R7989 Other specified abnormal findings of blood chemistry: Secondary | ICD-10-CM | POA: Diagnosis present

## 2024-04-07 DIAGNOSIS — M549 Dorsalgia, unspecified: Secondary | ICD-10-CM | POA: Insufficient documentation

## 2024-04-07 DIAGNOSIS — F1910 Other psychoactive substance abuse, uncomplicated: Secondary | ICD-10-CM | POA: Diagnosis not present

## 2024-04-07 DIAGNOSIS — Z79899 Other long term (current) drug therapy: Secondary | ICD-10-CM | POA: Insufficient documentation

## 2024-04-07 DIAGNOSIS — F319 Bipolar disorder, unspecified: Secondary | ICD-10-CM

## 2024-04-07 DIAGNOSIS — Z765 Malingerer [conscious simulation]: Secondary | ICD-10-CM

## 2024-04-07 DIAGNOSIS — G2401 Drug induced subacute dyskinesia: Secondary | ICD-10-CM | POA: Diagnosis present

## 2024-04-07 DIAGNOSIS — F4310 Post-traumatic stress disorder, unspecified: Secondary | ICD-10-CM

## 2024-04-07 DIAGNOSIS — I10 Essential (primary) hypertension: Secondary | ICD-10-CM | POA: Diagnosis not present

## 2024-04-07 DIAGNOSIS — F6089 Other specific personality disorders: Secondary | ICD-10-CM | POA: Diagnosis present

## 2024-04-07 DIAGNOSIS — F1490 Cocaine use, unspecified, uncomplicated: Secondary | ICD-10-CM | POA: Diagnosis present

## 2024-04-07 DIAGNOSIS — F339 Major depressive disorder, recurrent, unspecified: Secondary | ICD-10-CM

## 2024-04-07 DIAGNOSIS — F314 Bipolar disorder, current episode depressed, severe, without psychotic features: Secondary | ICD-10-CM

## 2024-04-07 DIAGNOSIS — E876 Hypokalemia: Secondary | ICD-10-CM | POA: Diagnosis present

## 2024-04-07 LAB — URINE DRUG SCREEN, QUALITATIVE (ARMC ONLY)
Amphetamines, Ur Screen: NOT DETECTED
Barbiturates, Ur Screen: POSITIVE — AB
Benzodiazepine, Ur Scrn: NOT DETECTED
Cannabinoid 50 Ng, Ur ~~LOC~~: NOT DETECTED
Cocaine Metabolite,Ur ~~LOC~~: POSITIVE — AB
MDMA (Ecstasy)Ur Screen: NOT DETECTED
Methadone Scn, Ur: NOT DETECTED
Opiate, Ur Screen: NOT DETECTED
Phencyclidine (PCP) Ur S: NOT DETECTED
Tricyclic, Ur Screen: NOT DETECTED

## 2024-04-07 MED ORDER — ALBUTEROL SULFATE HFA 108 (90 BASE) MCG/ACT IN AERS: 2.0000 | INHALATION_SPRAY | Freq: Four times a day (QID) | RESPIRATORY_TRACT | Status: DC | PRN

## 2024-04-07 MED ORDER — QUETIAPINE FUMARATE 200 MG PO TABS: 400.0000 mg | ORAL_TABLET | Freq: Every day | ORAL | Status: DC

## 2024-04-07 MED ORDER — ACETAMINOPHEN 325 MG PO TABS
650.0000 mg | ORAL_TABLET | Freq: Four times a day (QID) | ORAL | Status: DC | PRN
  Administered 2024-04-08 – 2024-04-11 (×4): 650 mg via ORAL
  Filled 2024-04-07 (×4): qty 2

## 2024-04-07 MED ORDER — HYDROXYZINE HCL 25 MG PO TABS
25.0000 mg | ORAL_TABLET | Freq: Three times a day (TID) | ORAL | Status: DC | PRN
  Administered 2024-04-07 – 2024-04-09 (×2): 25 mg via ORAL
  Filled 2024-04-07 (×2): qty 1

## 2024-04-07 MED ORDER — MAGNESIUM HYDROXIDE 400 MG/5ML PO SUSP: 30.0000 mL | Freq: Every day | ORAL | Status: DC | PRN

## 2024-04-07 MED ORDER — LORAZEPAM 2 MG/ML IJ SOLN: 2.0000 mg | Freq: Three times a day (TID) | INTRAMUSCULAR | Status: DC | PRN

## 2024-04-07 MED ORDER — ALUM & MAG HYDROXIDE-SIMETH 200-200-20 MG/5ML PO SUSP: 30.0000 mL | ORAL | Status: DC | PRN

## 2024-04-07 MED ORDER — BACLOFEN 10 MG PO TABS
10.0000 mg | ORAL_TABLET | Freq: Three times a day (TID) | ORAL | Status: DC
  Administered 2024-04-07 – 2024-04-09 (×6): 10 mg via ORAL
  Filled 2024-04-07: qty 42
  Filled 2024-04-07 (×5): qty 1

## 2024-04-07 MED ORDER — HALOPERIDOL LACTATE 5 MG/ML IJ SOLN: 5.0000 mg | Freq: Three times a day (TID) | INTRAMUSCULAR | Status: DC | PRN

## 2024-04-07 MED ORDER — AMLODIPINE BESYLATE 5 MG PO TABS
5.0000 mg | ORAL_TABLET | Freq: Every day | ORAL | Status: DC
  Administered 2024-04-08 – 2024-04-11 (×4): 5 mg via ORAL
  Filled 2024-04-07: qty 1
  Filled 2024-04-07: qty 14
  Filled 2024-04-07 (×2): qty 1

## 2024-04-07 MED ORDER — BACLOFEN 10 MG PO TABS
10.0000 mg | ORAL_TABLET | Freq: Three times a day (TID) | ORAL | Status: DC
  Administered 2024-04-07: 10 mg via ORAL
  Filled 2024-04-07: qty 1

## 2024-04-07 MED ORDER — DIPHENHYDRAMINE HCL 50 MG PO CAPS: 50.0000 mg | ORAL_CAPSULE | Freq: Three times a day (TID) | ORAL | Status: DC | PRN

## 2024-04-07 MED ORDER — NICOTINE 21 MG/24HR TD PT24
21.0000 mg | MEDICATED_PATCH | Freq: Every day | TRANSDERMAL | Status: DC
  Administered 2024-04-07 – 2024-04-11 (×5): 21 mg via TRANSDERMAL
  Filled 2024-04-07 (×5): qty 1

## 2024-04-07 MED ORDER — IBUPROFEN 600 MG PO TABS
600.0000 mg | ORAL_TABLET | Freq: Once | ORAL | Status: AC
  Administered 2024-04-07: 600 mg via ORAL
  Filled 2024-04-07: qty 1

## 2024-04-07 MED ORDER — TRAZODONE HCL 50 MG PO TABS
50.0000 mg | ORAL_TABLET | Freq: Every evening | ORAL | Status: DC | PRN
  Administered 2024-04-07 – 2024-04-10 (×4): 50 mg via ORAL
  Filled 2024-04-07 (×3): qty 1
  Filled 2024-04-07: qty 14
  Filled 2024-04-07: qty 1

## 2024-04-07 MED ORDER — ALBUTEROL SULFATE HFA 108 (90 BASE) MCG/ACT IN AERS
2.0000 | INHALATION_SPRAY | Freq: Four times a day (QID) | RESPIRATORY_TRACT | Status: DC | PRN
  Administered 2024-04-09 – 2024-04-11 (×3): 2 via RESPIRATORY_TRACT
  Filled 2024-04-07: qty 6.7

## 2024-04-07 MED ORDER — GABAPENTIN 300 MG PO CAPS
600.0000 mg | ORAL_CAPSULE | Freq: Three times a day (TID) | ORAL | Status: DC
  Administered 2024-04-07 – 2024-04-11 (×12): 600 mg via ORAL
  Filled 2024-04-07 (×3): qty 2
  Filled 2024-04-07: qty 84
  Filled 2024-04-07 (×9): qty 2

## 2024-04-07 MED ORDER — QUETIAPINE FUMARATE 200 MG PO TABS
200.0000 mg | ORAL_TABLET | Freq: Every day | ORAL | Status: DC
  Administered 2024-04-07: 200 mg via ORAL
  Filled 2024-04-07 (×2): qty 1

## 2024-04-07 MED ORDER — BACLOFEN 10 MG PO TABS
10.0000 mg | ORAL_TABLET | Freq: Once | ORAL | Status: AC
  Administered 2024-04-07: 10 mg via ORAL
  Filled 2024-04-07: qty 1

## 2024-04-07 MED ORDER — DIPHENHYDRAMINE HCL 50 MG/ML IJ SOLN: 50.0000 mg | Freq: Three times a day (TID) | INTRAMUSCULAR | Status: DC | PRN

## 2024-04-07 MED ORDER — HALOPERIDOL LACTATE 5 MG/ML IJ SOLN: 10.0000 mg | Freq: Three times a day (TID) | INTRAMUSCULAR | Status: DC | PRN

## 2024-04-07 MED ORDER — AMLODIPINE BESYLATE 5 MG PO TABS
5.0000 mg | ORAL_TABLET | Freq: Every day | ORAL | Status: DC
  Administered 2024-04-07: 5 mg via ORAL
  Filled 2024-04-07: qty 1

## 2024-04-07 MED ORDER — POTASSIUM CHLORIDE CRYS ER 20 MEQ PO TBCR
20.0000 meq | EXTENDED_RELEASE_TABLET | Freq: Every day | ORAL | Status: DC
  Administered 2024-04-07 – 2024-04-11 (×5): 20 meq via ORAL
  Filled 2024-04-07 (×5): qty 1

## 2024-04-07 MED ORDER — HALOPERIDOL 5 MG PO TABS: 5.0000 mg | ORAL_TABLET | Freq: Three times a day (TID) | ORAL | Status: DC | PRN

## 2024-04-07 NOTE — ED Notes (Signed)
 Patient is in the Dayroom calm and composed watching TV with other patients. NAD.  Environment secured per policy. Will monitor for safety.

## 2024-04-07 NOTE — ED Notes (Signed)
 Patient is in the bedroom sleeping NAD. Will continue to monitor for safety.

## 2024-04-07 NOTE — ED Notes (Signed)
 Pt requesting ibuprofen  for generalized joint aches, MD made aware
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
## 2024-04-07 NOTE — ED Notes (Signed)
 Voluntary admission to [facility] signed by patient and witnessed by this RN.

## 2024-04-07 NOTE — ED Notes (Signed)
Transfer consent signed. 

## 2024-04-07 NOTE — ED Notes (Signed)
Pt provided a dinner tray.  

## 2024-04-07 NOTE — ED Provider Notes (Signed)
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 [place] [address]   Troponin I (High Sensitivity) 02/11/2024 5  <18 ng/L Final   Comment: (NOTE) Elevated high sensitivity troponin I (hsTnI) values and significant  changes  across serial measurements may suggest ACS but many other  chronic and acute conditions are known to elevate hsTnI results.  Refer to the Links section for chest pain algorithms and additional  guidance. Performed at [facility], [address] [place], [place] [address]    MRSA by PCR Next Gen 02/11/2024 NOT DETECTED  NOT DETECTED Final   Comment: (NOTE) The GeneXpert MRSA Assay (FDA approved for NASAL specimens only), is one component of a comprehensive MRSA colonization surveillance program. It is not intended to diagnose MRSA infection nor to guide or monitor treatment for MRSA infections. Test performance is not FDA approved in patients less than 2  years old. Performed at [facility], [address] [place], [place] [address]    Specimen Description 02/11/2024    Final                   Value:TRACHEAL ASPIRATE Performed at [facility], [address] [place], [place] [address]    Special Requests 02/11/2024    Final                   Value:NONE Performed at [facility], [address] [place], [place] [address]    Gram Stain 02/11/2024    Final                   Value:RARE WBC PRESENT, PREDOMINANTLY PMN MODERATE GRAM NEGATIVE RODS RARE GRAM POSITIVE COCCI Performed at [facility], [address] [place], [place] [address]    Culture 02/11/2024    Final                   Value:ABUNDANT HAEMOPHILUS INFLUENZAE BETA LACTAMASE NEGATIVE FEW STAPHYLOCOCCUS AUREUS    Report Status 02/11/2024 02/16/2024 FINAL   Final   Organism ID, Bacteria 02/11/2024 STAPHYLOCOCCUS AUREUS   Final   SARS Coronavirus 2 by RT PCR 02/11/2024 NEGATIVE  NEGATIVE Final   Comment: (NOTE) SARS-CoV-2 target nucleic acids are NOT DETECTED.  The SARS-CoV-2 RNA is generally detectable in upper respiratory specimens during the acute phase of infection. The lowest concentration of SARS-CoV-2 viral copies this assay can detect is 138 copies/mL. A  negative result does not preclude SARS-Cov-2 infection and should not be used as the sole basis for treatment or other patient management decisions. A negative result may occur with  improper specimen collection/handling, submission of specimen other than nasopharyngeal swab, presence of viral mutation(s) within the areas targeted by this assay, and inadequate number of viral copies(<138 copies/mL). A negative result must be combined with clinical observations, patient history, and epidemiological information. The expected result is Negative.  Fact Sheet for Patients:  BloggerCourse.com  Fact Sheet for Healthcare Providers:  SeriousBroker.it  This test is no                          t yet approved or cleared by the [place]  FDA and  has been authorized for detection and/or diagnosis of SARS-CoV-2 by FDA under an Emergency Use Authorization (EUA). This EUA will remain  in effect (meaning this test can be used) for the duration of the COVID-19 declaration under Section 564(b)(1) of the Act, 21 U.S.C.section 360bbb-3(b)(1), unless the authorization is terminated  or revoked sooner.       Influenza A by PCR 02/11/2024 NEGATIVE  NEGATIVE Final   Influenza B by PCR 02/11/2024 NEGATIVE  NEGATIVE Final   Comment: (NOTE) The Xpert Xpress SARS-CoV-2/FLU/RSV plus assay is intended as an aid in the diagnosis of influenza from Nasopharyngeal swab specimens and should not be used as a sole basis for treatment. Nasal washings and aspirates are unacceptable for Xpert Xpress SARS-CoV-2/FLU/RSV testing.  Fact Sheet for Patients: BloggerCourse.com  Fact Sheet for Healthcare Providers: SeriousBroker.it  This test is not yet approved or cleared by the [place]  FDA and has been authorized for detection and/or diagnosis of SARS-CoV-2 by FDA under an Emergency Use Authorization (EUA). This  EUA will remain in effect (meaning this test can be used) for the duration of the COVID-19 declaration under Section 564(b)(1) of the Act, 21 U.S.C.  section 360bbb-3(b)(1), unless the authorization is terminated or revoked.     Resp Syncytial Virus by PCR 02/11/2024 NEGATIVE  NEGATIVE Final   Comment: (NOTE) Fact Sheet for Patients: BloggerCourse.com  Fact Sheet for Healthcare Providers: SeriousBroker.it  This test is not yet approved or cleared by the [place]  FDA and has been authorized for detection and/or diagnosis of SARS-CoV-2 by FDA under an Emergency Use Authorization (EUA). This EUA will remain in effect (meaning this test can be used) for the duration of the COVID-19 declaration under Section 564(b)(1) of the Act, 21 U.S.C. section 360bbb-3(b)(1), unless the authorization is terminated or revoked.  Performed at [facility], [address] [place], [place] [address]    Adenovirus 02/11/2024 NOT DETECTED  NOT DETECTED Final   Coronavirus 229E 02/11/2024 NOT DETECTED  NOT DETECTED Final   Comment: (NOTE) The Coronavirus on the Respiratory Panel, DOES NOT test for the novel  Coronavirus (2019 nCoV)    Coronavirus HKU1 02/11/2024 NOT DETECTED  NOT DETECTED Final   Coronavirus NL63 02/11/2024 NOT DETECTED  NOT DETECTED Final   Coronavirus OC43 02/11/2024 NOT DETECTED  NOT DETECTED Final   Metapneumovirus 02/11/2024 NOT DETECTED  NOT DETECTED Final   Rhinovirus / Enterovirus 02/11/2024 NOT DETECTED  NOT DETECTED Final   Influenza A 02/11/2024 NOT DETECTED  NOT DETECTED Final   Influenza B 02/11/2024 NOT DETECTED  NOT DETECTED Final   Parainfluenza Virus 1 02/11/2024 NOT DETECTED  NOT DETECTED Final   Parainfluenza Virus 2 02/11/2024 NOT DETECTED  NOT DETECTED Final   Parainfluenza Virus 3 02/11/2024 NOT DETECTED  NOT DETECTED Final   Parainfluenza Virus 4 02/11/2024 NOT DETECTED  NOT DETECTED Final    Respiratory Syncytial Virus 02/11/2024 NOT DETECTED  NOT DETECTED Final   Bordetella pertussis 02/11/2024 NOT DETECTED  NOT DETECTED Final   Bordetella Parapertussis 02/11/2024 NOT DETECTED  NOT DETECTED Final   Chlamydophila pneumoniae 02/11/2024 NOT DETECTED  NOT DETECTED Final   Mycoplasma pneumoniae 02/11/2024 NOT DETECTED  NOT DETECTED Final   Performed at [facility], [address] [place], [place] [address]   Glucose-Capillary 02/11/2024 161 (H)  70 - 99 mg/dL Final   Glucose reference range applies only to samples taken after fasting for at least 8 hours.   WBC 02/12/2024 10.2  4.0 - 10.5 K/uL Final   RBC 02/12/2024 4.69  3.87 - 5.11 MIL/uL Final   Hemoglobin 02/12/2024 14.0  12.0 - 15.0 g/dL Final   HCT 92/78/7974 42.2  36.0 - 46.0 % Final   MCV 02/12/2024 90.0  80.0 - 100.0 fL Final   MCH 02/12/2024 29.9  26.0 - 34.0 pg Final   MCHC 02/12/2024 33.2  30.0 - 36.0 g/dL Final   RDW 92/78/7974 13.6  11.5 - 15.5 % Final   Platelets 02/12/2024 194  150 - 400 K/uL Final   nRBC 02/12/2024 0.0  0.0 - 0.2 % Final   Performed at [facility], [address] [place], [place] [address]   Sodium 02/12/2024 144  135 - 145 mmol/L Final   Potassium 02/12/2024 3.1 (L)  3.5 - 5.1 mmol/L Final   Chloride 02/12/2024 110  98 - 111 mmol/L Final   CO2 02/12/2024 23  22 - 32 mmol/L Final   Glucose, Bld 02/12/2024 113 (H)  70 - 99 mg/dL Final   Glucose reference range applies only to samples taken after fasting for at least 8 hours.   BUN 02/12/2024 22 (H)  6 - 20 mg/dL Final  Creatinine, Ser 02/12/2024 0.76  0.44 - 1.00 mg/dL Final   Calcium  02/12/2024 9.2  8.9 - 10.3 mg/dL Final   GFR, Estimated 02/12/2024 >60  >60 mL/min Final   Comment: (NOTE) Calculated using the CKD-EPI Creatinine Equation (2021)    Anion gap 02/12/2024 14  5 - 15 Final   Performed at [facility], [address] [place], [place] [address]   FIO2 02/12/2024 24  % Final   MECHVT 02/12/2024 500   mL Final   PEEP 02/12/2024 5  cm H20 Final   pH, Arterial 02/12/2024 7.48 (H)  7.35 - 7.45 Final   pCO2 arterial 02/12/2024 34  32 - 48 mmHg Final   pO2, Arterial 02/12/2024 75 (L)  83 - 108 mmHg Final   Bicarbonate 02/12/2024 25.3  20.0 - 28.0 mmol/L Final   Acid-Base Excess 02/12/2024 2.2 (H)  0.0 - 2.0 mmol/L Final   O2 Saturation 02/12/2024 95.5  % Final   Patient temperature 02/12/2024 37.0   Final   Collection site 02/12/2024 RIGHT RADIAL   Final   Allens test (pass/fail) 02/12/2024 PASS  PASS Final   Mechanical Rate 02/12/2024 18   Final   Performed at [facility], [address] [place], [place] [address]   Magnesium  02/12/2024 2.0  1.7 - 2.4 mg/dL Final   Performed at [facility], [address] [place], [place] [address]   Phosphorus 02/12/2024 3.4  2.5 - 4.6 mg/dL Final   Performed at [facility], [address] [place], [place] [address]   Triglycerides 02/12/2024 154 (H)  <150 mg/dL Final   Performed at [facility], [address] [place], [place] [address]   Glucose-Capillary 02/11/2024 144 (H)  70 - 99 mg/dL Final   Glucose reference range applies only to samples taken after fasting for at least 8 hours.   Glucose-Capillary 02/11/2024 128 (H)  70 - 99 mg/dL Final   Glucose reference range applies only to samples taken after fasting for at least 8 hours.   Glucose-Capillary 02/12/2024 116 (H)  70 - 99 mg/dL Final   Glucose reference range applies only to samples taken after fasting for at least 8 hours.   Glucose-Capillary 02/12/2024 125 (H)  70 - 99 mg/dL Final   Glucose reference range applies only to samples taken after fasting for at least 8 hours.   Glucose-Capillary 02/12/2024 108 (H)  70 - 99 mg/dL Final   Glucose reference range applies only to samples taken after fasting for at least 8 hours.   Glucose-Capillary 02/12/2024 143 (H)  70 - 99 mg/dL Final   Glucose reference range applies only to samples taken after  fasting for at least 8 hours.   Triglycerides 02/13/2024 103  <150 mg/dL Final   Performed at [facility], [address] [place], [place] [address]   Magnesium  02/13/2024 2.2  1.7 - 2.4 mg/dL Final   Performed at [facility], [address] [place], [place] [address]   Phosphorus 02/13/2024 3.0  2.5 - 4.6 mg/dL Final   Performed at [facility], [address] [place] [address] [place], [place] [address]   Sodium 02/13/2024 145  135 - 145 mmol/L Final   Potassium 02/13/2024 3.3 (L)  3.5 - 5.1 mmol/L Final   Chloride 02/13/2024 110  98 - 111 mmol/L Final   CO2 02/13/2024 25  22 - 32 mmol/L Final   Glucose, Bld 02/13/2024 139 (H)  70 - 99 mg/dL Final   Glucose reference  range applies only to samples taken after fasting for at least 8 hours.   BUN 02/13/2024 32 (H)  6 - 20 mg/dL Final   Creatinine, Ser 02/13/2024 0.93  0.44 - 1.00 mg/dL Final   Calcium  02/13/2024 9.0  8.9 - 10.3 mg/dL Final   GFR, Estimated 02/13/2024 >60  >60 mL/min Final   Comment: (NOTE) Calculated using the CKD-EPI Creatinine Equation (2021)    Anion gap 02/13/2024 10  5 - 15 Final   Performed at [facility], [address] [place], [place] [address]   Glucose-Capillary 02/12/2024 126 (H)  70 - 99 mg/dL Final   Glucose reference range applies only to samples taken after fasting for at least 8 hours.   Glucose-Capillary 02/12/2024 133 (H)  70 - 99 mg/dL Final   Glucose reference range applies only to samples taken after fasting for at least 8 hours.   Glucose-Capillary 02/13/2024 149 (H)  70 - 99 mg/dL Final   Glucose reference range applies only to samples taken after fasting for at least 8 hours.   Glucose-Capillary 02/13/2024 154 (H)  70 - 99 mg/dL Final   Glucose reference range applies only to samples taken after fasting for at least 8 hours.   Glucose-Capillary 02/13/2024 167 (H)  70 - 99 mg/dL Final   Glucose reference range applies only to samples taken after fasting for at least 8  hours.   Glucose-Capillary 02/13/2024 87  70 - 99 mg/dL Final   Glucose reference range applies only to samples taken after fasting for at least 8 hours.   Magnesium  02/14/2024 2.0  1.7 - 2.4 mg/dL Final   Performed at [facility], [address] [place], [place] [address]   Phosphorus 02/14/2024 2.6  2.5 - 4.6 mg/dL Final   Performed at [facility], [address] [place], [place] [address]   Sodium 02/14/2024 143  135 - 145 mmol/L Final   Potassium 02/14/2024 3.5  3.5 - 5.1 mmol/L Final   Chloride 02/14/2024 111  98 - 111 mmol/L Final   CO2 02/14/2024 24  22 - 32 mmol/L Final   Glucose, Bld 02/14/2024 116 (H)  70 - 99 mg/dL Final   Glucose reference range applies only to samples taken after fasting for at least 8 hours.   BUN 02/14/2024 23 (H)  6 - 20 mg/dL Final   Creatinine, Ser 02/14/2024 0.72  0.44 - 1.00 mg/dL Final   Calcium  02/14/2024 8.3 (L)  8.9 - 10.3 mg/dL Final   GFR, Estimated 02/14/2024 >60  >60 mL/min Final   Comment: (NOTE) Calculated using the CKD-EPI Creatinine Equation (2021)    Anion gap 02/14/2024 8  5 - 15 Final   Performed at [facility], [address] [place], [place] [address]   Glucose-Capillary 02/13/2024 93  70 - 99 mg/dL Final   Glucose reference range applies only to samples taken after fasting for at least 8 hours.   Glucose-Capillary 02/13/2024 108 (H)  70 - 99 mg/dL Final   Glucose reference range applies only to samples taken after fasting for at least 8 hours.   Glucose-Capillary 02/14/2024 113 (H)  70 - 99 mg/dL Final   Glucose reference range applies only to samples taken after fasting for at least 8 hours.   Glucose-Capillary 02/14/2024 86  70 - 99 mg/dL Final   Glucose reference range applies only to samples taken after fasting for at least 8 hours.  Refill on 12/08/2023  Component Date Value Ref Range  Status   Rapid Strep A Screen 12/08/2023 Negative  Negative Final  Admission on 11/21/2023, Discharged on  11/21/2023  Component Date Value Ref Range Status   WBC 11/21/2023 8.2  4.0 - 10.5 K/uL Final   RBC 11/21/2023 4.03  3.87 - 5.11 MIL/uL Final   Hemoglobin 11/21/2023 11.9 (L)  12.0 - 15.0 g/dL Final   HCT 95/70/7974 36.6  36.0 - 46.0 % Final   MCV 11/21/2023 90.8  80.0 - 100.0 fL Final   MCH 11/21/2023 29.5  26.0 - 34.0 pg Final   MCHC 11/21/2023 32.5  30.0 - 36.0 g/dL Final   RDW 95/70/7974 13.0  11.5 - 15.5 % Final   Platelets 11/21/2023 223  150 - 400 K/uL Final   nRBC 11/21/2023 0.0  0.0 - 0.2 % Final   Performed at [facility], [address] [place], [place] [address]   Sodium 11/21/2023 142  135 - 145 mmol/L Final   Potassium 11/21/2023 3.1 (L)  3.5 - 5.1 mmol/L Final   Chloride 11/21/2023 112 (H)  98 - 111 mmol/L Final   CO2 11/21/2023 24  22 - 32 mmol/L Final   Glucose, Bld 11/21/2023 144 (H)  70 - 99 mg/dL Final   Glucose reference range applies only to samples taken after fasting for at least 8 hours.   BUN 11/21/2023 12  6 - 20 mg/dL Final   Creatinine, Ser 11/21/2023 0.82  0.44 - 1.00 mg/dL Final   Calcium  11/21/2023 8.4 (L)  8.9 - 10.3 mg/dL Final   Total Protein 95/70/7974 6.4 (L)  6.5 - 8.1 g/dL Final   Albumin 95/70/7974 3.5  3.5 - 5.0 g/dL Final   AST 95/70/7974 16  15 - 41 U/L Final   ALT 11/21/2023 11  0 - 44 U/L Final   Alkaline Phosphatase 11/21/2023 84  38 - 126 U/L Final   Total Bilirubin 11/21/2023 <0.2  0.0 - 1.2 mg/dL Final   GFR, Estimated 11/21/2023 >60  >60 mL/min Final   Comment: (NOTE) Calculated using the CKD-EPI Creatinine Equation (2021)    Anion gap 11/21/2023 6  5 - 15 Final   Performed at [facility], [address] [place], [place] [address]   Alcohol, Ethyl (B) 11/21/2023 69 (H)  <15 mg/dL Final   Comment: Please note change in reference range. (NOTE) For medical purposes only. Performed at [facility], [address] [place], [place] [address]   Office Visit on 11/03/2023  Component Date Value Ref  Range Status   COLOGUARD 11/18/2023 Sample Could Not Be Processed 9  N/A Final   The Cologuard (TM) test was assigned to this specimen.  An empty collection kit was received in the laboratory. The patient will be contacted to initiate a new sample collection.  Admission on 10/15/2023, Discharged on 10/15/2023  Component Date Value Ref Range Status   SARS Coronavirus 2 by RT PCR 10/15/2023 NEGATIVE  NEGATIVE Final   Comment: (NOTE) SARS-CoV-2 target nucleic acids are NOT DETECTED.  The SARS-CoV-2 RNA is generally detectable in upper respiratory specimens during the acute phase of infection. The lowest concentration of SARS-CoV-2 viral copies this assay can detect is 138 copies/mL. A negative result does not preclude SARS-Cov-2 infection and should not be used as the sole basis for treatment or other patient management decisions. A negative result may occur with  improper specimen collection/handling, submission of specimen other than nasopharyngeal swab, presence of viral mutation(s) within the areas targeted by this assay, and inadequate  number of viral copies(<138 copies/mL). A negative result must be combined with clinical observations, patient history, and epidemiological information. The expected result is Negative.  Fact Sheet for Patients:  BloggerCourse.com  Fact Sheet for Healthcare Providers:  SeriousBroker.it  This test is no                          t yet approved or cleared by the [place]  FDA and  has been authorized for detection and/or diagnosis of SARS-CoV-2 by FDA under an Emergency Use Authorization (EUA). This EUA will remain  in effect (meaning this test can be used) for the duration of the COVID-19 declaration under Section 564(b)(1) of the Act, 21 U.S.C.section 360bbb-3(b)(1), unless the authorization is terminated  or revoked sooner.       Influenza A by PCR 10/15/2023 NEGATIVE  NEGATIVE Final    Influenza B by PCR 10/15/2023 NEGATIVE  NEGATIVE Final   Comment: (NOTE) The Xpert Xpress SARS-CoV-2/FLU/RSV plus assay is intended as an aid in the diagnosis of influenza from Nasopharyngeal swab specimens and should not be used as a sole basis for treatment. Nasal washings and aspirates are unacceptable for Xpert Xpress SARS-CoV-2/FLU/RSV testing.  Fact Sheet for Patients: BloggerCourse.com  Fact Sheet for Healthcare Providers: SeriousBroker.it  This test is not yet approved or cleared by the [place]  FDA and has been authorized for detection and/or diagnosis of SARS-CoV-2 by FDA under an Emergency Use Authorization (EUA). This EUA will remain in effect (meaning this test can be used) for the duration of the COVID-19 declaration under Section 564(b)(1) of the Act, 21 U.S.C. section 360bbb-3(b)(1), unless the authorization is terminated or revoked.     Resp Syncytial Virus by PCR 10/15/2023 NEGATIVE  NEGATIVE Final   Comment: (NOTE) Fact Sheet for Patients: BloggerCourse.com  Fact Sheet for Healthcare Providers: SeriousBroker.it  This test is not yet approved or cleared by the [place]  FDA and has been authorized for detection and/or diagnosis of SARS-CoV-2 by FDA under an Emergency Use Authorization (EUA). This EUA will remain in effect (meaning this test can be used) for the duration of the COVID-19 declaration under Section 564(b)(1) of the Act, 21 U.S.C. section 360bbb-3(b)(1), unless the authorization is terminated or revoked.  Performed at [facility], [address] [place], [place] [address]     Allergies: Patient has no known allergies.  Medications:     Long Term Goals: Improvement in symptoms so as ready for discharge  Short Term Goals: Patient will verbalize feelings in meetings with treatment team members., Patient will attend at  least of 50% of the groups daily., Pt will complete the PHQ9 on admission, day 3 and discharge., Patient will participate in completing the [place] Suicide Severity Rating Scale, Patient will score a low risk of violence for 24 hours prior to discharge, and Patient will take medications as prescribed daily.  Medical Decision Making  Admission to [facility].  Safety protocol initiated Agitated Protocol initiated CIWA ordered  Facility Ordered Medications        acetaminophen  (TYLENOL ) tablet 650 mg PO  Q 6 PRN   alum & mag hydroxide-simeth (MAALOX/MYLANTA) 200-200-20 MG/5ML suspension 30 mL Q 4 PRN   magnesium  hydroxide (MILK OF MAGNESIA) suspension 30 mL   hydrOXYzine  (ATARAX ) tablet 25 mg PO TID PRN    traZODone  (DESYREL ) tablet 50 mg PO HS PRN   haloperidol  (HALDOL ) tablet 5 mg   And   diphenhydrAMINE  (BENADRYL ) capsule 50 mg  haloperidol  lactate (HALDOL ) injection 5 mg   And   diphenhydrAMINE  (BENADRYL ) injection 50 mg   And   LORazepam  (ATIVAN ) injection 2 mg   haloperidol  lactate (HALDOL ) injection 10 mg   And   diphenhydrAMINE  (BENADRYL ) injection 50 mg   And   LORazepam  (ATIVAN ) injection 2 mg   albuterol  (VENTOLIN  HFA) 108 (90 Base) MCG/ACT inhaler 2 puff   [START ON 04/08/2024] amLODipine  (NORVASC ) tablet 5 mg   baclofen  (LIORESAL ) tablet 10 mg   potassium chloride  SA (KLOR-CON  M) CR tablet 20 mEq   QUEtiapine  (SEROQUEL ) tablet 200 mg   baclofen  (LIORESAL ) tablet 10 mg   gabapentin  (NEURONTIN ) capsule 600 mg   nicotine  (NICODERM CQ  - dosed in mg/24 hours) patch 21 mg       Recommendations  Based on my evaluation the patient does not appear to have an emergency medical condition.  Randall Bouquet, NP 04/07/24  3:41 PM

## 2024-04-07 NOTE — ED Notes (Signed)
 Pt arrived to [facility] bed 163 from [facility] for detox from cocaine and passive SI.  Pt noted to have difficulty being in stationary position, states she feels like she has to move a lot and the movements feel involuntary, Fidgety and restless.  Pt speaks softly and fast at times, can be difficult to understand.  Pt has trach stoma (s/p drug overdose in 01/2024).  Pt has scar I'm medial area of abdomen d/t hx of having g tube.  Pt has broken skinned (blister like appearance) on coccyx, reddened area blanchable. Pt denied Current SI plan and intent during assessment.  She denied HI and A/V hallucinations as well.    Cooperative with admission process Q 15 minute observations for safety in place

## 2024-04-08 ENCOUNTER — Ambulatory Visit: Payer: Self-pay

## 2024-04-08 ENCOUNTER — Telehealth: Payer: Self-pay | Admitting: Family Medicine

## 2024-04-08 DIAGNOSIS — R454 Irritability and anger: Secondary | ICD-10-CM | POA: Diagnosis not present

## 2024-04-08 DIAGNOSIS — F17210 Nicotine dependence, cigarettes, uncomplicated: Secondary | ICD-10-CM | POA: Diagnosis not present

## 2024-04-08 DIAGNOSIS — F1910 Other psychoactive substance abuse, uncomplicated: Secondary | ICD-10-CM | POA: Diagnosis not present

## 2024-04-08 DIAGNOSIS — I10 Essential (primary) hypertension: Secondary | ICD-10-CM | POA: Diagnosis not present

## 2024-04-08 LAB — VITAMIN D 25 HYDROXY (VIT D DEFICIENCY, FRACTURES): Vit D, 25-Hydroxy: 39.25 ng/mL (ref 30–100)

## 2024-04-08 LAB — TSH: TSH: 1.858 u[IU]/mL (ref 0.350–4.500)

## 2024-04-08 MED ORDER — DOCUSATE SODIUM 100 MG PO CAPS
100.0000 mg | ORAL_CAPSULE | Freq: Every day | ORAL | Status: DC
Start: 2024-04-08 — End: 2024-04-11
  Administered 2024-04-10 – 2024-04-11 (×2): 100 mg via ORAL
  Filled 2024-04-08 (×3): qty 1
  Filled 2024-04-08: qty 14
  Filled 2024-04-08: qty 1

## 2024-04-08 MED ORDER — AMLODIPINE BESYLATE 5 MG PO TABS
5.0000 mg | ORAL_TABLET | Freq: Every day | ORAL | Status: DC
Start: 2024-04-08 — End: 2024-04-09
  Filled 2024-04-08: qty 1

## 2024-04-08 MED ORDER — PHENYLEPHRINE-MINERAL OIL-PET 0.25-14-74.9 % RE OINT
1.0000 | TOPICAL_OINTMENT | Freq: Once | RECTAL | Status: AC
  Administered 2024-04-08: 1 via RECTAL
  Filled 2024-04-08: qty 57

## 2024-04-08 MED ORDER — NICOTINE 21 MG/24HR TD PT24: 21.0000 mg | MEDICATED_PATCH | Freq: Every day | TRANSDERMAL | Status: DC

## 2024-04-08 MED ORDER — BACLOFEN 10 MG PO TABS
10.0000 mg | ORAL_TABLET | Freq: Three times a day (TID) | ORAL | Status: DC
Start: 2024-04-08 — End: 2024-04-09
  Filled 2024-04-08 (×2): qty 1

## 2024-04-08 MED ORDER — MAGNESIUM HYDROXIDE 400 MG/5ML PO SUSP: 30.0000 mL | Freq: Every day | ORAL | Status: DC | PRN

## 2024-04-08 MED ORDER — LIDOCAINE 5 % EX PTCH
1.0000 | MEDICATED_PATCH | CUTANEOUS | Status: DC
  Administered 2024-04-08 – 2024-04-09 (×2): 1 via TRANSDERMAL
  Filled 2024-04-08 (×2): qty 1

## 2024-04-08 MED ORDER — ACETAMINOPHEN 325 MG PO TABS: 650.0000 mg | ORAL_TABLET | Freq: Four times a day (QID) | ORAL | Status: DC | PRN

## 2024-04-08 MED ORDER — ALUM & MAG HYDROXIDE-SIMETH 200-200-20 MG/5ML PO SUSP: 30.0000 mL | ORAL | Status: DC | PRN

## 2024-04-08 MED ORDER — QUETIAPINE FUMARATE 200 MG PO TABS
400.0000 mg | ORAL_TABLET | Freq: Every day | ORAL | Status: DC
  Administered 2024-04-08 – 2024-04-10 (×3): 400 mg via ORAL
  Filled 2024-04-08: qty 2
  Filled 2024-04-08: qty 28
  Filled 2024-04-08 (×2): qty 2

## 2024-04-08 MED ORDER — OLANZAPINE 10 MG IM SOLR: 5.0000 mg | Freq: Three times a day (TID) | INTRAMUSCULAR | Status: DC | PRN

## 2024-04-08 MED ORDER — ALBUTEROL SULFATE HFA 108 (90 BASE) MCG/ACT IN AERS: 2.0000 | INHALATION_SPRAY | Freq: Four times a day (QID) | RESPIRATORY_TRACT | Status: DC | PRN

## 2024-04-08 NOTE — Telephone Encounter (Signed)
 Contacted [employer] [facility] and informed nurse that while Andrea Sanford is in patient at facility that provider is unable to assist with request. All questions and concerns have been addressed.
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
## 2024-04-08 NOTE — ED Provider Notes (Signed)
 Behavioral Health Progress Note  Date and Time: 04/08/2024 3:45 PM Name: Andrea Sanford MRN:  [id number]  Subjective:  Andrea Sanford was seen in the common area today. She had a long dialogue about her somatic complaints that she wanted to be addressed and was difficult to interrupt. She complained of foot bunions, pain, cramps, rectal pain, back pain and alludes to being a patient of 2 different pain clinics, but did not specify which ones. She also sees her primary care for neuropathy. She asks for peroxide to soak her feet and wipes because the paper here is rough. She has previously had hemorrhoids. She is sleeping okay and eating well. She denies any new psychiatric complaints, but her reality testing (and organization) are questionable.   Diagnosis:  Final diagnoses:  Polysubstance use disorder    Total Time spent with patient: 20 minutes  Past Psychiatric History: Polysubstance abuse, MDD, Bipolar, PTSD  Past Medical History: HTN, GIRD, DM, Polyneuropathy, Hypokalemia, ... Family History: Patient reports that her mother has similar mental problems and substance abuse Social History: Homeless. Lost custody of her children. Unemployed        Sleep: Good  Appetite:  Good  Current Medications:  Current Facility-Administered Medications  Medication Dose Route Frequency Provider Last Rate Last Admin   acetaminophen  (TYLENOL ) tablet 650 mg  650 mg Oral Q6H PRN Brent, Amanda C, NP   650 mg at 04/08/24 1235   albuterol  (VENTOLIN  HFA) 108 (90 Base) MCG/ACT inhaler 2 puff  2 puff Inhalation Q6H PRN Brent, Amanda C, NP       alum & mag hydroxide-simeth (MAALOX/MYLANTA) 200-200-20 MG/5ML suspension 30 mL  30 mL Oral Q4H PRN Brent, Amanda C, NP       amLODipine  (NORVASC ) tablet 5 mg  5 mg Oral Daily Brent, Amanda C, NP   5 mg at 04/08/24 9078   baclofen  (LIORESAL ) tablet 10 mg  10 mg Oral TID Brent, Amanda C, NP   10 mg at 04/08/24 1515   haloperidol  (HALDOL ) tablet 5 mg  5 mg Oral TID PRN  Brent, Amanda C, NP       And   diphenhydrAMINE  (BENADRYL ) capsule 50 mg  50 mg Oral TID PRN Brent, Amanda C, NP       haloperidol  lactate (HALDOL ) injection 5 mg  5 mg Intramuscular TID PRN Brent, Amanda C, NP       And   diphenhydrAMINE  (BENADRYL ) injection 50 mg  50 mg Intramuscular TID PRN Brent, Amanda C, NP       And   LORazepam  (ATIVAN ) injection 2 mg  2 mg Intramuscular TID PRN Brent, Amanda C, NP       haloperidol  lactate (HALDOL ) injection 10 mg  10 mg Intramuscular TID PRN Brent, Amanda C, NP       And   diphenhydrAMINE  (BENADRYL ) injection 50 mg  50 mg Intramuscular TID PRN Brent, Amanda C, NP       And   LORazepam  (ATIVAN ) injection 2 mg  2 mg Intramuscular TID PRN Brent, Amanda C, NP       docusate sodium  (COLACE) capsule 100 mg  100 mg Oral Daily Wandra Babin, Corean Massa, MD       gabapentin  (NEURONTIN ) capsule 600 mg  600 mg Oral TID Randall, Veronique M, NP   600 mg at 04/08/24 1515   hydrOXYzine  (ATARAX ) tablet 25 mg  25 mg Oral TID PRN Brent, Amanda C, NP   25 mg at 04/07/24 1505   lidocaine  (LIDODERM ) 5 %  1 patch  1 patch Transdermal Q24H Fatuma Dowers, Corean Massa, MD       magnesium  hydroxide (MILK OF MAGNESIA) suspension 30 mL  30 mL Oral Daily PRN Brent, Amanda C, NP       nicotine  (NICODERM CQ  - dosed in mg/24 hours) patch 21 mg  21 mg Transdermal Daily Byungura, Veronique M, NP   21 mg at 04/08/24 9077   phenylephrine -shark liver oil-mineral oil-petrolatum  (PREPARATION H) rectal ointment 1 Application  1 Application Rectal Once Cora Brierley Leigh, MD       potassium chloride  SA (KLOR-CON  M) CR tablet 20 mEq  20 mEq Oral Daily Byungura, Veronique M, NP   20 mEq at 04/08/24 9078   QUEtiapine  (SEROQUEL ) tablet 200 mg  200 mg Oral QHS Randall Bouquet M, NP   200 mg at 04/07/24 2149   traZODone  (DESYREL ) tablet 50 mg  50 mg Oral QHS PRN Brent, Amanda C, NP   50 mg at 04/07/24 2148   Current Outpatient Medications  Medication Sig Dispense Refill   albuterol  (VENTOLIN   HFA) 108 (90 Base) MCG/ACT inhaler Inhale 2 puffs into the lungs every 6 (six) hours as needed for wheezing or shortness of breath. 8 g 5   amLODipine  (NORVASC ) 5 MG tablet Take 1 tablet (5 mg total) by mouth daily. 90 tablet 1   Aspirin-Salicylamide-Caffeine  (ARTHRITIS STRENGTH BC POWDER PO) Take 3 packets by mouth every 6 (six) hours as needed (For pain).     atorvastatin  (LIPITOR) 40 MG tablet Take 40 mg by mouth daily.     baclofen  (LIORESAL ) 10 MG tablet Take 1 tablet (10 mg total) by mouth 3 (three) times daily. 90 each 1   gabapentin  (NEURONTIN ) 800 MG tablet Take 800 mg by mouth 3 (three) times daily.     meloxicam  (MOBIC ) 15 MG tablet Take 15 mg by mouth daily.     montelukast  (SINGULAIR ) 10 MG tablet Take 10 mg by mouth daily.     QUEtiapine  (SEROQUEL ) 400 MG tablet Take 1 tablet (400 mg total) by mouth at bedtime. 90 tablet 1   venlafaxine  XR (EFFEXOR -XR) 150 MG 24 hr capsule Take 150 mg by mouth daily.      Labs  Lab Results:  Admission on 03/28/2024, Discharged on 03/28/2024  Component Date Value Ref Range Status   Sodium 03/27/2024 141  135 - 145 mmol/L Final   Potassium 03/27/2024 3.2 (L)  3.5 - 5.1 mmol/L Final   Chloride 03/27/2024 103  98 - 111 mmol/L Final   CO2 03/27/2024 28  22 - 32 mmol/L Final   Glucose, Bld 03/27/2024 91  70 - 99 mg/dL Final   Glucose reference range applies only to samples taken after fasting for at least 8 hours.   BUN 03/27/2024 20  6 - 20 mg/dL Final   Creatinine, Ser 03/27/2024 0.86  0.44 - 1.00 mg/dL Final   Calcium  03/27/2024 8.8 (L)  8.9 - 10.3 mg/dL Final   GFR, Estimated 03/27/2024 >60  >60 mL/min Final   Comment: (NOTE) Calculated using the CKD-EPI Creatinine Equation (2021)    Anion gap 03/27/2024 10  5 - 15 Final   Performed at [facility], [address] [place], [place] [address]   WBC 03/27/2024 7.0  4.0 - 10.5 K/uL Final   RBC 03/27/2024 3.83 (L)  3.87 - 5.11 MIL/uL Final   Hemoglobin 03/27/2024 11.4 (L)  12.0 -  15.0 g/dL Final   HCT 90/96/7974 34.7 (L)  36.0 - 46.0 % Final  MCV 03/27/2024 90.6  80.0 - 100.0 fL Final   MCH 03/27/2024 29.8  26.0 - 34.0 pg Final   MCHC 03/27/2024 32.9  30.0 - 36.0 g/dL Final   RDW 90/96/7974 12.7  11.5 - 15.5 % Final   Platelets 03/27/2024 215  150 - 400 K/uL Final   nRBC 03/27/2024 0.0  0.0 - 0.2 % Final   Performed at [facility], [address] [place], [place] [address]   SARS Coronavirus 2 by RT PCR 03/28/2024 NEGATIVE  NEGATIVE Final   Comment: (NOTE) SARS-CoV-2 target nucleic acids are NOT DETECTED.  The SARS-CoV-2 RNA is generally detectable in upper respiratory specimens during the acute phase of infection. The lowest concentration of SARS-CoV-2 viral copies this assay can detect is 138 copies/mL. A negative result does not preclude SARS-Cov-2 infection and should not be used as the sole basis for treatment or other patient management decisions. A negative result may occur with  improper specimen collection/handling, submission of specimen other than nasopharyngeal swab, presence of viral mutation(s) within the areas targeted by this assay, and inadequate number of viral copies(<138 copies/mL). A negative result must be combined with clinical observations, patient history, and epidemiological information. The expected result is Negative.  Fact Sheet for Patients:  BloggerCourse.com  Fact Sheet for Healthcare Providers:  SeriousBroker.it  This test is no                          t yet approved or cleared by the [place]  FDA and  has been authorized for detection and/or diagnosis of SARS-CoV-2 by FDA under an Emergency Use Authorization (EUA). This EUA will remain  in effect (meaning this test can be used) for the duration of the COVID-19 declaration under Section 564(b)(1) of the Act, 21 U.S.C.section 360bbb-3(b)(1), unless the authorization is terminated  or revoked sooner.        Influenza A by PCR 03/28/2024 NEGATIVE  NEGATIVE Final   Influenza B by PCR 03/28/2024 NEGATIVE  NEGATIVE Final   Comment: (NOTE) The Xpert Xpress SARS-CoV-2/FLU/RSV plus assay is intended as an aid in the diagnosis of influenza from Nasopharyngeal swab specimens and should not be used as a sole basis for treatment. Nasal washings and aspirates are unacceptable for Xpert Xpress SARS-CoV-2/FLU/RSV testing.  Fact Sheet for Patients: BloggerCourse.com  Fact Sheet for Healthcare Providers: SeriousBroker.it  This test is not yet approved or cleared by the [place]  FDA and has been authorized for detection and/or diagnosis of SARS-CoV-2 by FDA under an Emergency Use Authorization (EUA). This EUA will remain in effect (meaning this test can be used) for the duration of the COVID-19 declaration under Section 564(b)(1) of the Act, 21 U.S.C. section 360bbb-3(b)(1), unless the authorization is terminated or revoked.     Resp Syncytial Virus by PCR 03/28/2024 NEGATIVE  NEGATIVE Final   Comment: (NOTE) Fact Sheet for Patients: BloggerCourse.com  Fact Sheet for Healthcare Providers: SeriousBroker.it  This test is not yet approved or cleared by the [place]  FDA and has been authorized for detection and/or diagnosis of SARS-CoV-2 by FDA under an Emergency Use Authorization (EUA). This EUA will remain in effect (meaning this test can be used) for the duration of the COVID-19 declaration under Section 564(b)(1) of the Act, 21 U.S.C. section 360bbb-3(b)(1), unless the authorization is terminated or revoked.  Performed at [facility], [address], [place], [place] [address]   Congregational Nurse Program on 02/15/2024  Component Date Value Ref  Range Status   POC Glucose 02/15/2024 182 (A)  70 - 99 mg/dl Final   nonfasting  Admission on 02/11/2024, Discharged  on 02/14/2024  Component Date Value Ref Range Status   Sodium 02/11/2024 140  135 - 145 mmol/L Final   Potassium 02/11/2024 3.8  3.5 - 5.1 mmol/L Final   Chloride 02/11/2024 104  98 - 111 mmol/L Final   CO2 02/11/2024 24  22 - 32 mmol/L Final   Glucose, Bld 02/11/2024 154 (H)  70 - 99 mg/dL Final   Glucose reference range applies only to samples taken after fasting for at least 8 hours.   BUN 02/11/2024 22 (H)  6 - 20 mg/dL Final   Creatinine, Ser 02/11/2024 0.86  0.44 - 1.00 mg/dL Final   Calcium  02/11/2024 9.9  8.9 - 10.3 mg/dL Final   Total Protein 92/79/7974 7.8  6.5 - 8.1 g/dL Final   Albumin 92/79/7974 4.2  3.5 - 5.0 g/dL Final   AST 92/79/7974 23  15 - 41 U/L Final   ALT 02/11/2024 14  0 - 44 U/L Final   Alkaline Phosphatase 02/11/2024 92  38 - 126 U/L Final   Total Bilirubin 02/11/2024 1.1  0.0 - 1.2 mg/dL Final   GFR, Estimated 02/11/2024 >60  >60 mL/min Final   Comment: (NOTE) Calculated using the CKD-EPI Creatinine Equation (2021)    Anion gap 02/11/2024 12  5 - 15 Final   Performed at [facility], [address] [place], [place] [address]   Troponin I (High Sensitivity) 02/11/2024 4  <18 ng/L Final   Comment: (NOTE) Elevated high sensitivity troponin I (hsTnI) values and significant  changes across serial measurements may suggest ACS but many other  chronic and acute conditions are known to elevate hsTnI results.  Refer to the Links section for chest pain algorithms and additional  guidance. Performed at [facility], [address] [place], [place] [address]    WBC 02/11/2024 11.1 (H)  4.0 - 10.5 K/uL Final   RBC 02/11/2024 5.38 (H)  3.87 - 5.11 MIL/uL Final   Hemoglobin 02/11/2024 16.0 (H)  12.0 - 15.0 g/dL Final   HCT 92/79/7974 48.2 (H)  36.0 - 46.0 % Final   MCV 02/11/2024 89.6  80.0 - 100.0 fL Final   MCH 02/11/2024 29.7  26.0 - 34.0 pg Final   MCHC 02/11/2024 33.2  30.0 - 36.0 g/dL Final   RDW 92/79/7974 13.2  11.5 - 15.5 % Final    Platelets 02/11/2024 224  150 - 400 K/uL Final   nRBC 02/11/2024 0.0  0.0 - 0.2 % Final   Neutrophils Relative % 02/11/2024 73  % Final   Neutro Abs 02/11/2024 8.2 (H)  1.7 - 7.7 K/uL Final   Lymphocytes Relative 02/11/2024 19  % Final   Lymphs Abs 02/11/2024 2.1  0.7 - 4.0 K/uL Final   Monocytes Relative 02/11/2024 6  % Final   Monocytes Absolute 02/11/2024 0.7  0.1 - 1.0 K/uL Final   Eosinophils Relative 02/11/2024 1  % Final   Eosinophils Absolute 02/11/2024 0.1  0.0 - 0.5 K/uL Final   Basophils Relative 02/11/2024 1  % Final   Basophils Absolute 02/11/2024 0.1  0.0 - 0.1 K/uL Final   Immature Granulocytes 02/11/2024 0  % Final   Abs Immature Granulocytes 02/11/2024 0.04  0.00 - 0.07 K/uL Final   Performed at [facility], [address] [place], [place] [address]   Prothrombin Time 02/11/2024 14.1  11.4 - 15.2  seconds Final   INR 02/11/2024 1.0  0.8 - 1.2 Final   Comment: (NOTE) INR goal varies based on device and disease states. Performed at [facility], [address] [place], [place] [address]    Color, Urine 02/11/2024 STRAW (A)  YELLOW Final   APPearance 02/11/2024 CLEAR (A)  CLEAR Final   Specific Gravity, Urine 02/11/2024 1.009  1.005 - 1.030 Final   pH 02/11/2024 5.0  5.0 - 8.0 Final   Glucose, UA 02/11/2024 NEGATIVE  NEGATIVE mg/dL Final   Hgb urine dipstick 02/11/2024 NEGATIVE  NEGATIVE Final   Bilirubin Urine 02/11/2024 NEGATIVE  NEGATIVE Final   Ketones, ur 02/11/2024 5 (A)  NEGATIVE mg/dL Final   Protein, ur 92/79/7974 NEGATIVE  NEGATIVE mg/dL Final   Nitrite 92/79/7974 NEGATIVE  NEGATIVE Final   Leukocytes,Ua 02/11/2024 NEGATIVE  NEGATIVE Final   Performed at [facility], [address] [place], [place] [address]   Tricyclic, Ur Screen 02/11/2024 NONE DETECTED  NONE DETECTED Final   Amphetamines, Ur Screen 02/11/2024 NONE DETECTED  NONE DETECTED Final   MDMA (Ecstasy)Ur Screen 02/11/2024 NONE DETECTED  NONE DETECTED Final    Cocaine Metabolite,Ur [place] 02/11/2024 POSITIVE (A)  NONE DETECTED Final   Opiate, Ur Screen 02/11/2024 NONE DETECTED  NONE DETECTED Final   Phencyclidine (PCP) Ur S 02/11/2024 NONE DETECTED  NONE DETECTED Final   Cannabinoid 50 Ng, Ur [place] 02/11/2024 POSITIVE (A)  NONE DETECTED Final   Barbiturates, Ur Screen 02/11/2024 NONE DETECTED  NONE DETECTED Final   Benzodiazepine, Ur Scrn 02/11/2024 NONE DETECTED  NONE DETECTED Final   Methadone Scn, Ur 02/11/2024 NONE DETECTED  NONE DETECTED Final   Comment: (NOTE) Tricyclics + metabolites, urine    Cutoff 1000 ng/mL Amphetamines + metabolites, urine  Cutoff 1000 ng/mL MDMA (Ecstasy), urine              Cutoff 500 ng/mL Cocaine Metabolite, urine          Cutoff 300 ng/mL Opiate + metabolites, urine        Cutoff 300 ng/mL Phencyclidine (PCP), urine         Cutoff 25 ng/mL Cannabinoid, urine                 Cutoff 50 ng/mL Barbiturates + metabolites, urine  Cutoff 200 ng/mL Benzodiazepine, urine              Cutoff 200 ng/mL Methadone, urine                   Cutoff 300 ng/mL  The urine drug screen provides only a preliminary, unconfirmed analytical test result and should not be used for non-medical purposes. Clinical consideration and professional judgment should be applied to any positive drug screen result due to possible interfering substances. A more specific alternate chemical method must be used in order to obtain a confirmed analytical result. Gas chromatography / mass spectrometry (GC/MS) is the preferred confirm                          atory method. Performed at [facility], [address] [place], [place] [address]    FIO2 02/11/2024 0.24  % Final   Delivery systems 02/11/2024 VENTILATOR   Final   Mode 02/11/2024 PRESSURE REGULATED VOLUME CONTROL   Final   MECHVT 02/11/2024 500  mL Final   RATE 02/11/2024 18  resp/min Final   PEEP 02/11/2024 5.0  cm H20 Final   pH,  Arterial 02/11/2024 7.39  7.35 - 7.45 Final   pCO2  arterial 02/11/2024 40  32 - 48 mmHg Final   pO2, Arterial 02/11/2024 86  83 - 108 mmHg Final   Bicarbonate 02/11/2024 24.2  20.0 - 28.0 mmol/L Final   Acid-base deficit 02/11/2024 0.7  0.0 - 2.0 mmol/L Final   O2 Saturation 02/11/2024 97.7  % Final   Patient temperature 02/11/2024 37.0   Final   Collection site 02/11/2024 LEFT BRACHIAL   Final   Allens test (pass/fail) 02/11/2024 PASS  PASS Final   Performed at [facility], [address] [place], [place] [address]   Acetaminophen  (Tylenol ), Serum 02/11/2024 <10 (L)  10 - 30 ug/mL Final   Comment: (NOTE) Therapeutic concentrations vary significantly. A range of 10-30 ug/mL  may be an effective concentration for many patients. However, some  are best treated at concentrations outside of this range. Acetaminophen  concentrations >150 ug/mL at 4 hours after ingestion  and >50 ug/mL at 12 hours after ingestion are often associated with  toxic reactions.  Performed at [facility], [address] [place], [place] [address]    Salicylate Lvl 02/11/2024 <7.0 (L)  7.0 - 30.0 mg/dL Final   Performed at [facility], [address] [place], [place] [address]   Alcohol, Ethyl (B) 02/11/2024 <15  <15 mg/dL Final   Comment: (NOTE) For medical purposes only. Performed at [facility], [address] [place], [place] [address]    ABO/RH(D) 02/11/2024 O POS   Final   Antibody Screen 02/11/2024 NEG   Final   Sample Expiration 02/11/2024    Final                   Value:02/14/2024,2359 Performed at [facility], [address] [place], [place] [address]    Salicylate Lvl 02/11/2024 <7.0 (L)  7.0 - 30.0 mg/dL Final   Performed at [facility], [address] [place], [place] [address]   Troponin I (High Sensitivity) 02/11/2024 5  <18 ng/L Final   Comment: (NOTE) Elevated high sensitivity troponin I (hsTnI) values and significant  changes across serial measurements may suggest ACS  but many other  chronic and acute conditions are known to elevate hsTnI results.  Refer to the Links section for chest pain algorithms and additional  guidance. Performed at [facility], [address] [place], [place] [address]    MRSA by PCR Next Gen 02/11/2024 NOT DETECTED  NOT DETECTED Final   Comment: (NOTE) The GeneXpert MRSA Assay (FDA approved for NASAL specimens only), is one component of a comprehensive MRSA colonization surveillance program. It is not intended to diagnose MRSA infection nor to guide or monitor treatment for MRSA infections. Test performance is not FDA approved in patients less than 76 years old. Performed at [facility], [address] [place], [place] [address]    Specimen Description 02/11/2024    Final                   Value:TRACHEAL ASPIRATE Performed at [facility], [address] [place], [place] [address]    Special Requests 02/11/2024    Final                   Value:NONE Performed at [facility], [address] [place], [place] [address]    Gram Stain 02/11/2024    Final  Value:RARE WBC PRESENT, PREDOMINANTLY PMN MODERATE GRAM NEGATIVE RODS RARE GRAM POSITIVE COCCI Performed at [facility], [address] [place], [place] [address]    Culture 02/11/2024    Final                   Value:ABUNDANT HAEMOPHILUS INFLUENZAE BETA LACTAMASE NEGATIVE FEW STAPHYLOCOCCUS AUREUS    Report Status 02/11/2024 02/16/2024 FINAL   Final   Organism ID, Bacteria 02/11/2024 STAPHYLOCOCCUS AUREUS   Final   SARS Coronavirus 2 by RT PCR 02/11/2024 NEGATIVE  NEGATIVE Final   Comment: (NOTE) SARS-CoV-2 target nucleic acids are NOT DETECTED.  The SARS-CoV-2 RNA is generally detectable in upper respiratory specimens during the acute phase of infection. The lowest concentration of SARS-CoV-2 viral copies this assay can detect is 138 copies/mL. A negative result does not preclude  SARS-Cov-2 infection and should not be used as the sole basis for treatment or other patient management decisions. A negative result may occur with  improper specimen collection/handling, submission of specimen other than nasopharyngeal swab, presence of viral mutation(s) within the areas targeted by this assay, and inadequate number of viral copies(<138 copies/mL). A negative result must be combined with clinical observations, patient history, and epidemiological information. The expected result is Negative.  Fact Sheet for Patients:  BloggerCourse.com  Fact Sheet for Healthcare Providers:  SeriousBroker.it  This test is no                          t yet approved or cleared by the [place]  FDA and  has been authorized for detection and/or diagnosis of SARS-CoV-2 by FDA under an Emergency Use Authorization (EUA). This EUA will remain  in effect (meaning this test can be used) for the duration of the COVID-19 declaration under Section 564(b)(1) of the Act, 21 U.S.C.section 360bbb-3(b)(1), unless the authorization is terminated  or revoked sooner.       Influenza A by PCR 02/11/2024 NEGATIVE  NEGATIVE Final   Influenza B by PCR 02/11/2024 NEGATIVE  NEGATIVE Final   Comment: (NOTE) The Xpert Xpress SARS-CoV-2/FLU/RSV plus assay is intended as an aid in the diagnosis of influenza from Nasopharyngeal swab specimens and should not be used as a sole basis for treatment. Nasal washings and aspirates are unacceptable for Xpert Xpress SARS-CoV-2/FLU/RSV testing.  Fact Sheet for Patients: BloggerCourse.com  Fact Sheet for Healthcare Providers: SeriousBroker.it  This test is not yet approved or cleared by the [place]  FDA and has been authorized for detection and/or diagnosis of SARS-CoV-2 by FDA under an Emergency Use Authorization (EUA). This EUA will remain in effect  (meaning this test can be used) for the duration of the COVID-19 declaration under Section 564(b)(1) of the Act, 21 U.S.C. section 360bbb-3(b)(1), unless the authorization is terminated or revoked.     Resp Syncytial Virus by PCR 02/11/2024 NEGATIVE  NEGATIVE Final   Comment: (NOTE) Fact Sheet for Patients: BloggerCourse.com  Fact Sheet for Healthcare Providers: SeriousBroker.it  This test is not yet approved or cleared by the [place]  FDA and has been authorized for detection and/or diagnosis of SARS-CoV-2 by FDA under an Emergency Use Authorization (EUA). This EUA will remain in effect (meaning this test can be used) for the duration of the COVID-19 declaration under Section 564(b)(1) of the Act, 21 U.S.C. section 360bbb-3(b)(1), unless the authorization is terminated or revoked.  Performed at [facility], [address] [place], [place] [address]    Adenovirus 02/11/2024  NOT DETECTED  NOT DETECTED Final   Coronavirus 229E 02/11/2024 NOT DETECTED  NOT DETECTED Final   Comment: (NOTE) The Coronavirus on the Respiratory Panel, DOES NOT test for the novel  Coronavirus (2019 nCoV)    Coronavirus HKU1 02/11/2024 NOT DETECTED  NOT DETECTED Final   Coronavirus NL63 02/11/2024 NOT DETECTED  NOT DETECTED Final   Coronavirus OC43 02/11/2024 NOT DETECTED  NOT DETECTED Final   Metapneumovirus 02/11/2024 NOT DETECTED  NOT DETECTED Final   Rhinovirus / Enterovirus 02/11/2024 NOT DETECTED  NOT DETECTED Final   Influenza A 02/11/2024 NOT DETECTED  NOT DETECTED Final   Influenza B 02/11/2024 NOT DETECTED  NOT DETECTED Final   Parainfluenza Virus 1 02/11/2024 NOT DETECTED  NOT DETECTED Final   Parainfluenza Virus 2 02/11/2024 NOT DETECTED  NOT DETECTED Final   Parainfluenza Virus 3 02/11/2024 NOT DETECTED  NOT DETECTED Final   Parainfluenza Virus 4 02/11/2024 NOT DETECTED  NOT DETECTED Final   Respiratory Syncytial Virus  02/11/2024 NOT DETECTED  NOT DETECTED Final   Bordetella pertussis 02/11/2024 NOT DETECTED  NOT DETECTED Final   Bordetella Parapertussis 02/11/2024 NOT DETECTED  NOT DETECTED Final   Chlamydophila pneumoniae 02/11/2024 NOT DETECTED  NOT DETECTED Final   Mycoplasma pneumoniae 02/11/2024 NOT DETECTED  NOT DETECTED Final   Performed at [facility], [address] [place], [place] [address]   Glucose-Capillary 02/11/2024 161 (H)  70 - 99 mg/dL Final   Glucose reference range applies only to samples taken after fasting for at least 8 hours.   WBC 02/12/2024 10.2  4.0 - 10.5 K/uL Final   RBC 02/12/2024 4.69  3.87 - 5.11 MIL/uL Final   Hemoglobin 02/12/2024 14.0  12.0 - 15.0 g/dL Final   HCT 92/78/7974 42.2  36.0 - 46.0 % Final   MCV 02/12/2024 90.0  80.0 - 100.0 fL Final   [facility] 02/12/2024 29.9  26.0 - 34.0 pg Final   MCHC 02/12/2024 33.2  30.0 - 36.0 g/dL Final   RDW 92/78/7974 13.6  11.5 - 15.5 % Final   Platelets 02/12/2024 194  150 - 400 K/uL Final   nRBC 02/12/2024 0.0  0.0 - 0.2 % Final   Performed at [facility], [address] [place], [place] [address]   Sodium 02/12/2024 144  135 - 145 mmol/L Final   Potassium 02/12/2024 3.1 (L)  3.5 - 5.1 mmol/L Final   Chloride 02/12/2024 110  98 - 111 mmol/L Final   CO2 02/12/2024 23  22 - 32 mmol/L Final   Glucose, Bld 02/12/2024 113 (H)  70 - 99 mg/dL Final   Glucose reference range applies only to samples taken after fasting for at least 8 hours.   BUN 02/12/2024 22 (H)  6 - 20 mg/dL Final   Creatinine, Ser 02/12/2024 0.76  0.44 - 1.00 mg/dL Final   Calcium  02/12/2024 9.2  8.9 - 10.3 mg/dL Final   GFR, Estimated 02/12/2024 >60  >60 mL/min Final   Comment: (NOTE) Calculated using the CKD-EPI Creatinine Equation (2021)    Anion gap 02/12/2024 14  5 - 15 Final   Performed at [facility], [address] [place], [place] [address]   FIO2 02/12/2024 24  % Final   MECHVT 02/12/2024 500  mL Final   PEEP 02/12/2024  5  cm H20 Final   pH, Arterial 02/12/2024 7.48 (H)  7.35 - 7.45 Final   pCO2 arterial 02/12/2024 34  32 - 48 mmHg Final   pO2, Arterial 02/12/2024  75 (L)  83 - 108 mmHg Final   Bicarbonate 02/12/2024 25.3  20.0 - 28.0 mmol/L Final   Acid-Base Excess 02/12/2024 2.2 (H)  0.0 - 2.0 mmol/L Final   O2 Saturation 02/12/2024 95.5  % Final   Patient temperature 02/12/2024 37.0   Final   Collection site 02/12/2024 RIGHT RADIAL   Final   Allens test (pass/fail) 02/12/2024 PASS  PASS Final   Mechanical Rate 02/12/2024 18   Final   Performed at [facility], [address] [place], [place] [address]   Magnesium  02/12/2024 2.0  1.7 - 2.4 mg/dL Final   Performed at [facility], [address] [place], [place] [address]   Phosphorus 02/12/2024 3.4  2.5 - 4.6 mg/dL Final   Performed at [facility], [address] [place], [place] [address]   Triglycerides 02/12/2024 154 (H)  <150 mg/dL Final   Performed at [facility], [address] [place], [place] [address]   Glucose-Capillary 02/11/2024 144 (H)  70 - 99 mg/dL Final   Glucose reference range applies only to samples taken after fasting for at least 8 hours.   Glucose-Capillary 02/11/2024 128 (H)  70 - 99 mg/dL Final   Glucose reference range applies only to samples taken after fasting for at least 8 hours.   Glucose-Capillary 02/12/2024 116 (H)  70 - 99 mg/dL Final   Glucose reference range applies only to samples taken after fasting for at least 8 hours.   Glucose-Capillary 02/12/2024 125 (H)  70 - 99 mg/dL Final   Glucose reference range applies only to samples taken after fasting for at least 8 hours.   Glucose-Capillary 02/12/2024 108 (H)  70 - 99 mg/dL Final   Glucose reference range applies only to samples taken after fasting for at least 8 hours.   Glucose-Capillary 02/12/2024 143 (H)  70 - 99 mg/dL Final   Glucose reference range applies only to samples taken after fasting for at least 8 hours.    Triglycerides 02/13/2024 103  <150 mg/dL Final   Performed at [facility], [address] [place], [place] [address]   Magnesium  02/13/2024 2.2  1.7 - 2.4 mg/dL Final   Performed at [facility], [address] [place], [place] [address]   Phosphorus 02/13/2024 3.0  2.5 - 4.6 mg/dL Final   Performed at [facility], [address] [place], [place] [address]   Sodium 02/13/2024 145  135 - 145 mmol/L Final   Potassium 02/13/2024 3.3 (L)  3.5 - 5.1 mmol/L Final   Chloride 02/13/2024 110  98 - 111 mmol/L Final   CO2 02/13/2024 25  22 - 32 mmol/L Final   Glucose, Bld 02/13/2024 139 (H)  70 - 99 mg/dL Final   Glucose reference range applies only to samples taken after fasting for at least 8 hours.   BUN 02/13/2024 32 (H)  6 - 20 mg/dL Final   Creatinine, Ser 02/13/2024 0.93  0.44 - 1.00 mg/dL Final   Calcium  02/13/2024 9.0  8.9 - 10.3 mg/dL Final   GFR, Estimated 02/13/2024 >60  >60 mL/min Final   Comment: (NOTE) Calculated using the CKD-EPI Creatinine Equation (2021)    Anion gap 02/13/2024 10  5 - 15 Final   Performed at [facility], [address] [place], [place] [address]   Glucose-Capillary 02/12/2024 126 (H)  70 - 99 mg/dL Final   Glucose reference range applies only to samples taken after fasting for at least 8  hours.   Glucose-Capillary 02/12/2024 133 (H)  70 - 99 mg/dL Final   Glucose reference range applies only to samples taken after fasting for at least 8 hours.   Glucose-Capillary 02/13/2024 149 (H)  70 - 99 mg/dL Final   Glucose reference range applies only to samples taken after fasting for at least 8 hours.   Glucose-Capillary 02/13/2024 154 (H)  70 - 99 mg/dL Final   Glucose reference range applies only to samples taken after fasting for at least 8 hours.   Glucose-Capillary 02/13/2024 167 (H)  70 - 99 mg/dL Final   Glucose reference range applies only to samples taken after fasting for at least 8 hours.   Glucose-Capillary  02/13/2024 87  70 - 99 mg/dL Final   Glucose reference range applies only to samples taken after fasting for at least 8 hours.   Magnesium  02/14/2024 2.0  1.7 - 2.4 mg/dL Final   Performed at [facility], [address] [place], [place] [address]   Phosphorus 02/14/2024 2.6  2.5 - 4.6 mg/dL Final   Performed at [facility], [address] [place], [place] [address]   Sodium 02/14/2024 143  135 - 145 mmol/L Final   Potassium 02/14/2024 3.5  3.5 - 5.1 mmol/L Final   Chloride 02/14/2024 111  98 - 111 mmol/L Final   CO2 02/14/2024 24  22 - 32 mmol/L Final   Glucose, Bld 02/14/2024 116 (H)  70 - 99 mg/dL Final   Glucose reference range applies only to samples taken after fasting for at least 8 hours.   BUN 02/14/2024 23 (H)  6 - 20 mg/dL Final   Creatinine, Ser 02/14/2024 0.72  0.44 - 1.00 mg/dL Final   Calcium  02/14/2024 8.3 (L)  8.9 - 10.3 mg/dL Final   GFR, Estimated 02/14/2024 >60  >60 mL/min Final   Comment: (NOTE) Calculated using the CKD-EPI Creatinine Equation (2021)    Anion gap 02/14/2024 8  5 - 15 Final   Performed at [facility], [address] [place], [place] [address]   Glucose-Capillary 02/13/2024 93  70 - 99 mg/dL Final   Glucose reference range applies only to samples taken after fasting for at least 8 hours.   Glucose-Capillary 02/13/2024 108 (H)  70 - 99 mg/dL Final   Glucose reference range applies only to samples taken after fasting for at least 8 hours.   Glucose-Capillary 02/14/2024 113 (H)  70 - 99 mg/dL Final   Glucose reference range applies only to samples taken after fasting for at least 8 hours.   Glucose-Capillary 02/14/2024 86  70 - 99 mg/dL Final   Glucose reference range applies only to samples taken after fasting for at least 8 hours.  Refill on 12/08/2023  Component Date Value Ref Range Status   Rapid Strep A Screen 12/08/2023 Negative  Negative Final  Admission on 11/21/2023, Discharged on 11/21/2023  Component Date  Value Ref Range Status   WBC 11/21/2023 8.2  4.0 - 10.5 K/uL Final   RBC 11/21/2023 4.03  3.87 - 5.11 MIL/uL Final   Hemoglobin 11/21/2023 11.9 (L)  12.0 - 15.0 g/dL Final   HCT 95/70/7974 36.6  36.0 - 46.0 % Final   MCV 11/21/2023 90.8  80.0 - 100.0 fL Final   MCH 11/21/2023 29.5  26.0 - 34.0 pg Final   MCHC 11/21/2023 32.5  30.0 - 36.0 g/dL Final   RDW 95/70/7974 13.0  11.5 - 15.5 % Final   Platelets 11/21/2023 223  150 -  400 K/uL Final   nRBC 11/21/2023 0.0  0.0 - 0.2 % Final   Performed at [facility], [address] [place], [place] [address]   Sodium 11/21/2023 142  135 - 145 mmol/L Final   Potassium 11/21/2023 3.1 (L)  3.5 - 5.1 mmol/L Final   Chloride 11/21/2023 112 (H)  98 - 111 mmol/L Final   CO2 11/21/2023 24  22 - 32 mmol/L Final   Glucose, Bld 11/21/2023 144 (H)  70 - 99 mg/dL Final   Glucose reference range applies only to samples taken after fasting for at least 8 hours.   BUN 11/21/2023 12  6 - 20 mg/dL Final   Creatinine, Ser 11/21/2023 0.82  0.44 - 1.00 mg/dL Final   Calcium  11/21/2023 8.4 (L)  8.9 - 10.3 mg/dL Final   Total Protein 95/70/7974 6.4 (L)  6.5 - 8.1 g/dL Final   Albumin 95/70/7974 3.5  3.5 - 5.0 g/dL Final   AST 95/70/7974 16  15 - 41 U/L Final   ALT 11/21/2023 11  0 - 44 U/L Final   Alkaline Phosphatase 11/21/2023 84  38 - 126 U/L Final   Total Bilirubin 11/21/2023 <0.2  0.0 - 1.2 mg/dL Final   GFR, Estimated 11/21/2023 >60  >60 mL/min Final   Comment: (NOTE) Calculated using the CKD-EPI Creatinine Equation (2021)    Anion gap 11/21/2023 6  5 - 15 Final   Performed at [facility], [address] [place], [place] [address]   Alcohol, Ethyl (B) 11/21/2023 69 (H)  <15 mg/dL Final   Comment: Please note change in reference range. (NOTE) For medical purposes only. Performed at [facility], [address] [place], [place] [address]   Office Visit on 11/03/2023  Component Date Value Ref Range Status   COLOGUARD  11/18/2023 Sample Could Not Be Processed 9  N/A Final   The Cologuard (TM) test was assigned to this specimen.  An empty collection kit was received in the laboratory. The patient will be contacted to initiate a new sample collection.  Admission on 10/15/2023, Discharged on 10/15/2023  Component Date Value Ref Range Status   SARS Coronavirus 2 by RT PCR 10/15/2023 NEGATIVE  NEGATIVE Final   Comment: (NOTE) SARS-CoV-2 target nucleic acids are NOT DETECTED.  The SARS-CoV-2 RNA is generally detectable in upper respiratory specimens during the acute phase of infection. The lowest concentration of SARS-CoV-2 viral copies this assay can detect is 138 copies/mL. A negative result does not preclude SARS-Cov-2 infection and should not be used as the sole basis for treatment or other patient management decisions. A negative result may occur with  improper specimen collection/handling, submission of specimen other than nasopharyngeal swab, presence of viral mutation(s) within the areas targeted by this assay, and inadequate number of viral copies(<138 copies/mL). A negative result must be combined with clinical observations, patient history, and epidemiological information. The expected result is Negative.  Fact Sheet for Patients:  BloggerCourse.com  Fact Sheet for Healthcare Providers:  SeriousBroker.it  This test is no                          t yet approved or cleared by the [place]  FDA and  has been authorized for detection and/or diagnosis of SARS-CoV-2 by FDA under an Emergency Use Authorization (EUA). This EUA will remain  in effect (meaning this test can be used) for the duration of the COVID-19 declaration under Section 564(b)(1) of the Act,  21 U.S.C.section 360bbb-3(b)(1), unless the authorization is terminated  or revoked sooner.       Influenza A by PCR 10/15/2023 NEGATIVE  NEGATIVE Final   Influenza B by PCR 10/15/2023  NEGATIVE  NEGATIVE Final   Comment: (NOTE) The Xpert Xpress SARS-CoV-2/FLU/RSV plus assay is intended as an aid in the diagnosis of influenza from Nasopharyngeal swab specimens and should not be used as a sole basis for treatment. Nasal washings and aspirates are unacceptable for Xpert Xpress SARS-CoV-2/FLU/RSV testing.  Fact Sheet for Patients: BloggerCourse.com  Fact Sheet for Healthcare Providers: SeriousBroker.it  This test is not yet approved or cleared by the [place]  FDA and has been authorized for detection and/or diagnosis of SARS-CoV-2 by FDA under an Emergency Use Authorization (EUA). This EUA will remain in effect (meaning this test can be used) for the duration of the COVID-19 declaration under Section 564(b)(1) of the Act, 21 U.S.C. section 360bbb-3(b)(1), unless the authorization is terminated or revoked.     Resp Syncytial Virus by PCR 10/15/2023 NEGATIVE  NEGATIVE Final   Comment: (NOTE) Fact Sheet for Patients: BloggerCourse.com  Fact Sheet for Healthcare Providers: SeriousBroker.it  This test is not yet approved or cleared by the [place]  FDA and has been authorized for detection and/or diagnosis of SARS-CoV-2 by FDA under an Emergency Use Authorization (EUA). This EUA will remain in effect (meaning this test can be used) for the duration of the COVID-19 declaration under Section 564(b)(1) of the Act, 21 U.S.C. section 360bbb-3(b)(1), unless the authorization is terminated or revoked.  Performed at [facility], [address] [place], [place] [address]     Blood Alcohol level:  Lab Results  Component Value Date   [facility] <15 04/06/2024   [facility] <15 02/11/2024    Metabolic Disorder Labs: Lab Results  Component Value Date   HGBA1C 6.6 (A) 10/03/2023   MPG 148.46 09/14/2023   No results found for: PROLACTIN Lab Results  Component  Value Date   CHOL 213 (H) 10/03/2023   TRIG 103 02/13/2024   HDL 49 10/03/2023   CHOLHDL 4.3 10/03/2023   LDLCALC 137 (H) 10/03/2023   LDLCALC 128 (H) 06/13/2023    Therapeutic Lab Levels: No results found for: LITHIUM No results found for: VALPROATE No results found for: CBMZ  Physical Findings   GAD-7    Flowsheet Row Office Visit from 09/29/2023 in [facility] Office Visit from 06/13/2023 in [facility] Counselor from 08/29/2017 in [facility]  Total GAD-7 Score 10 16 19    PHQ2-9    Flowsheet Row ED from 04/07/2024 in [facility] Patient Outreach Telephone from 04/02/2024 in [place] [facility] Office Visit from 09/29/2023 in [facility] Office Visit from 09/22/2023 in [facility] Office Visit from 06/13/2023 in [facility]  PHQ-2 Total Score 6 3 3 4 3   PHQ-9 Total Score 24 11 16 18 11    Flowsheet Row ED from 04/07/2024 in [facility] ED from 03/28/2024 in [facility] Emergency Department at [facility] ED from 11/21/2023 in [facility] Emergency Department at [facility]  C-SSRS RISK CATEGORY Low Risk No Risk No Risk     Musculoskeletal  Strength & Muscle Tone: within normal limits Gait & Station: normal Patient leans: N/A  Psychiatric Specialty Exam  Presentation  General Appearance:  Disheveled  Eye Contact: Fair  Speech: -- (increased total  quantity)  Speech Volume: Normal  Handedness: Right   Mood and Affect  Mood: Anxious; Depressed  Affect: Flat   Thought Process  Thought Processes: Irrevelant  Descriptions of Associations:Loose  Orientation:Full (Time, Place and Person)  Thought Content:Rumination; Scattered  Diagnosis of Schizophrenia or Schizoaffective disorder in past: No     Hallucinations:Hallucinations: None  Ideas of Reference:None  Suicidal Thoughts:Suicidal Thoughts: No SI Active Intent and/or Plan: Without Plan  Homicidal Thoughts:Homicidal Thoughts: No   Sensorium  Memory: Immediate Fair; Recent Fair; Remote Poor  Judgment: Poor  Insight: Poor   Executive Functions  Concentration: Poor  Attention Span: Poor  Recall: Fair  Fund of Knowledge: Fair  Language: Fair   Psychomotor Activity  Psychomotor Activity: Psychomotor Activity: Restlessness   Assets  Assets: Desire for Improvement; Leisure Time   Sleep  Sleep: Sleep: Good  Estimated Sleeping Duration (Last 24 Hours): 9.50-11.00 hours  Nutritional Assessment (For OBS and FBC admissions only) Has the patient had a weight loss or gain of 10 pounds or more in the last 3 months?: No Has the patient had a decrease in food intake/or appetite?: No Does the patient have dental problems?: No Does the patient have eating habits or behaviors that may be indicators of an eating disorder including binging or inducing vomiting?: No Has the patient recently lost weight without trying?: 0 Has the patient been eating poorly because of a decreased appetite?: 0 Malnutrition Screening Tool Score: 0    Physical Exam  Physical Exam Vitals and nursing note reviewed.  Constitutional:      Appearance: She is ill-appearing.  HENT:     Head: Normocephalic and atraumatic.  Eyes:     Extraocular Movements: Extraocular movements intact.  Pulmonary:     Effort: Pulmonary effort is normal.  Musculoskeletal:        General: Normal range of motion.     Cervical back: Normal range of motion.  Neurological:     General: No focal deficit present.     Mental Status: She is alert and oriented to person, place, and time.  Psychiatric:        Mood and Affect: Mood is anxious. Affect is flat.    Review of Systems  Respiratory:  Negative for cough.   Cardiovascular:  Negative for chest  pain.  Gastrointestinal:  Positive for abdominal pain and constipation. Negative for diarrhea.  Musculoskeletal:  Positive for joint pain and myalgias.       Bilateral foot pain  Skin:        bunions  Psychiatric/Behavioral:  Negative for hallucinations and suicidal ideas.    Blood pressure (!) 135/94, pulse 94, temperature 98.4 F (36.9 C), temperature source Oral, resp. rate 18, SpO2 97%. There is no height or weight on file to calculate BMI.  Treatment Plan Summary: Long Term Goals: Improvement in symptoms so as ready for discharge   Short Term Goals: Patient will verbalize feelings in meetings with treatment team members., Patient will attend at least of 50% of the groups daily., Pt will complete the PHQ9 on admission, day 3 and discharge., and Patient will take medications as prescribed daily.  Medications: Mood/anxiety: continue group therapy, milieu therapy, 1:1 evaluation with provider.  Medication management: seroquel  200mg  PO at bedtime, gabapentin  600mg  PO TID  Substance Abuse:  brief intervention provided abstinence advised.  stimulant use: encourage patient to maintain PO intake. Coverage PRN for secondary psychosis.  Nicotine  and tobacco use: Nicotine  replacement therapy provided.  counseling on cessation of nicotine  use  provided Medical: PRNs for pain, constipation, indigestion available.  Labs/studies: nutrition panel: patients with substance use and/or mental health disorders frequently have poor PO intake. Additionally, vitamin deficiencies are known to have symptoms such as lethargy, confusion, fatigue, restlessness and sleep disturbance, which can exacerbate mental illness.  Acute hepatitis panel, HIV and RPR: substance users and mental illness increases the risk of exposure to blood born pathogens, both from lifestyle, high risk behaviors and intimate contact. This is often ordered as a screening for new mental health presentation, or known exposure.  repeating CMP due  to abnormal previous values EKG to establish baseline prior to start of high risk medications TSH, free T4, thyroid  evaluation: to rule out thyroid  involvement, as abnormalities of function can produce symptoms that are psychiatric in nature and exacerbate underlying conditions, as well as be the sole driver for some presentations  Safety and Monitoring: voluntarily admission to [facility]/Facility based care unit [facility]) unit for safety, stabilization and treatment Daily contact with patient to assess and evaluate symptoms and progress in treatment Patient's case to be discussed in multi-disciplinary team meeting Observation Level : q15 minute checks Vital signs: q12 hours Precautions: withdrawal and fall  Based on my evaluation the patient does not appear to have an emergency medical condition.   Corean Anette Potters, MD 04/08/2024 3:45 PM
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
## 2024-04-08 NOTE — ED Notes (Signed)
Patient asleep, NAD 

## 2024-04-08 NOTE — ED Notes (Signed)
 Pt in courtyard participating in group. No acute distress noted. No concerns voiced. Informed pt to notify staff with any needs or assistance. Pt verbalized understanding and agreement. Will continue to monitor for safety.

## 2024-04-08 NOTE — Telephone Encounter (Signed)
 Copied from CRM [id number]. Topic: Clinical - Prescription Issue >> Apr 08, 2024  2:02 PM Macario HERO wrote: Reason for CRM: Patient is calling to request a walker, experiencing sever muscle pain. Andrea Sanford is in a detox facility and wants someone from provider office to call and advise them that she needs to take get medication. She is [facility] at [facility] [phone]. Also stated she currently has no phone; stated she had to go for a meeting and requesting this is done today.

## 2024-04-08 NOTE — Telephone Encounter (Signed)
 FYI Only or Action Required?: Action required by provider: Needs call back to care team at [facility] at [phone] with recommendations for pt care asap, including what meds pt needs at this time for symptoms that include SOB with wheezing, severe pain, and wound maintenance. Needs call back to pt in health center (no phone herself) at [phone] for next steps and help with social work if at all possible - no phone at home yet she's been scheduled a telephone visit with social work on 9/30, not able to drive, etc. Pt also requesting a script for a walker.  Patient was last seen in primary care on 02/19/2024 by Ziglar, Susan K, MD.  Called Nurse Triage reporting Muscle Pain, Shortness of Breath, Wheezing, Tingling, Joint Swelling, and social work needs.  Symptoms began several days ago.  Interventions attempted: Rest, hydration, or home remedies and Other: limited provisions of meds/etc at behavioral health center per protocols.  Symptoms are: rapidly worsening.  Triage Disposition: See HCP Within 4 Hours (Or PCP Triage)  Patient/caregiver understands and will follow disposition?: No, wishes to speak with PCP     Copied from CRM #[id number]. Topic: Clinical - Red Word Triage >> Apr 08, 2024  2:36 PM Andrea Sanford wrote: Red Word that prompted transfer to Nurse Triage: experiencing severe muscle pain and the place that she is at is not giving her the medication that she need  Per PAS, called at 2 pm then 40 min later.  CRM # [id number] Owner: Seward Magnolia BROCKS, RN Status: Unresolved Open  Priority: Routine Created on: 04/08/2024 02:02 PM By: Logan Macario SAILOR   Primary Information  Source  Andrea Sanford (Patient)   Subject  Andrea Sanford (Patient)   Topic  Clinical - Prescription Issue    Communication  Reason for CRM: Patient is calling to request a walker, experiencing sever muscle pain. Andrea Sanford is in a detox facility and wants someone from provider  office to call and advise them that she needs to take get medication. She is currently at [facility] [phone]. Also stated she currently has no phone; stated she had to go for a meeting and requesting this is done today.     Reason for CRM: Patient is calling to request a walker, experiencing sever muscle pain. Andrea Sanford is in a detox facility and wants someone from provider office to call and advise them that she needs to take get medication. She is currently at [facility] [phone]. Also stated she currently has no phone; stated she had to go for a meeting and requesting this is done today. Reason for Disposition  [1] MILD difficulty breathing (e.g., minimal/no SOB at rest, SOB with walking, pulse < 100) AND [2] NEW-onset or WORSE than normal  Answer Assessment - Initial Assessment Questions Advised pt be examined right away with symptoms, more difficulty breathing than usual, pt currently at behavioral health center. Advised that nurse sending message to PCP office for call back to care team at [facility] at [phone] with recommendations for pt care asap, including what meds pt needs at this time for symptoms, also for call back to pt in health center (no phone herself) at [phone] for next steps and help with social work if at all possible.    1. ONSET: When did the muscle aches or body pains start?      ongoing 2. LOCATION: What part of your body is  hurting? (e.g., entire body, arms, legs)      Rib cage hurts sometimes, having pain in back, ankle, shoulder, collarbone, muscle spasms at neck and legs, toes are burning so bad 3. SEVERITY: How bad is the pain? (Scale 1-10; or mild, moderate, severe)     severe 5. FEVER: Do you have a fever? If Yes, ask: What is your temperature, how was it measured, and  when did it start?      no 6. OTHER SYMPTOMS: Do you have any other  symptoms? (e.g., chest pain, cold or flu symptoms, rash, weakness, weight loss)     Very seldom get to where can't breathe Denies chest pain Trying to detox off the cocaine, trying to get myself help, not wanting any narcotics or cocaine, want help with my mental situation and cleanliness and the pain Burning and tingling sensation in toes and hands, feet are still burning If keep feet still then feels like on fire Need help to get these people to understand need for med at this time Don't care about [facility] Can't do nothing without something thinking the wrong thing about what I'm doing Interrupted her sentence, hold on hold on, groaning, heel but going away now Swelling in different areas like to left knee then go back down Can't walk well Can't pick my feet up high enough, so fall sometimes Needing some help Breathing through pain Needs to know about referral [facility] Hx of suicide attempts Have behavioral coordinator Ava Got 2 more days here at [facility] Not giving me peroxide or antiseptic for personal care of toes or to with toilet paper Hurting my butthole, simple things Won't give me nystatin  or baby powder or antibacterial/antiseptic Get little bitty alcohol pad won't clean really well Neck wound as well but looking okay, no weeping right now Need a walker or cane, can't have it around here Can't keep my pants up, have to walk around with one arm to hold pants up Someone named Chrysal to help me get [facility] team But no phone for that appt with Chrystal on 9/30  Sounds like wheezing Looking for an [facility] near mom, but don't think they do women [facility] also need ID to go into shelter and don't know if have to have ID, don't have her ID Pt tearful Call back at [phone] for pt in the center Please get a walker for pt, what need to do for that, don't need no cadillac but something to sit down with anywhere Wheezing hole in my neck where trach coming from, been  doing it since had trach taken out 1.5 months ago Speaking in phrases to full sentences Feel like having trouble breathing, won't let me have inhaler here, hadn't prescribed it yet, can't ask for it, don't know who to ask for it Have to go bathroom before use it on myself, please have someone call Advised tell someone right away that she's having trouble breathing They don't care, they don't care, thank you bye  Protocols used: Muscle Aches and Body Pain-A-AH, Breathing Difficulty-A-AH

## 2024-04-08 NOTE — ED Notes (Signed)
 [name]. Denies intent to harm self/others when asked. Denies A/VH. Patient denies any physical complaints when asked. No acute distress noted. Support and encouragement provided. Routine safety checks conducted according to facility protocol. Encouraged patient to notify staff if thoughts of harm toward self or others arise. Patient verbalize understanding and agreement. Will continue to monitor for safety.

## 2024-04-08 NOTE — Care Management (Signed)
 [facility] Care Management   Writer met with patient to discuss discharge planning.  Patient reports that she wants inpatient substance abuse treatment.  Writer will refer the patient to [facility] in [place], [place], [place] and [facility].  Patient reports that she wants to be placed in [place] so that she is able to be close to her mother.

## 2024-04-08 NOTE — ED Notes (Signed)
 Pt sitting in dayroom watching television and interacting with peers. No acute distress noted. No concerns voiced. Informed pt to notify staff with any needs or assistance. Pt verbalized understanding and agreement. Will continue to monitor for safety.

## 2024-04-09 ENCOUNTER — Encounter (HOSPITAL_COMMUNITY): Payer: Self-pay | Admitting: Psychiatry

## 2024-04-09 DIAGNOSIS — R7989 Other specified abnormal findings of blood chemistry: Secondary | ICD-10-CM | POA: Diagnosis present

## 2024-04-09 DIAGNOSIS — R454 Irritability and anger: Secondary | ICD-10-CM | POA: Diagnosis not present

## 2024-04-09 DIAGNOSIS — F17210 Nicotine dependence, cigarettes, uncomplicated: Secondary | ICD-10-CM | POA: Diagnosis not present

## 2024-04-09 DIAGNOSIS — I10 Essential (primary) hypertension: Secondary | ICD-10-CM | POA: Diagnosis not present

## 2024-04-09 DIAGNOSIS — F1910 Other psychoactive substance abuse, uncomplicated: Secondary | ICD-10-CM | POA: Diagnosis not present

## 2024-04-09 LAB — LIPID PANEL
Cholesterol: 121 mg/dL (ref 0–200)
HDL: 39 mg/dL — ABNORMAL LOW (ref >40)
LDL Cholesterol: 28 mg/dL (ref 0–99)
Total CHOL/HDL Ratio: 3.1 ratio
Triglycerides: 270 mg/dL — ABNORMAL HIGH (ref <150)
VLDL: 54 mg/dL — ABNORMAL HIGH (ref 0–40)

## 2024-04-09 LAB — COMPREHENSIVE METABOLIC PANEL WITH GFR
ALT: 15 U/L (ref 0–44)
AST: 17 U/L (ref 15–41)
Albumin: 3.5 g/dL (ref 3.5–5.0)
Alkaline Phosphatase: 104 U/L (ref 38–126)
Anion gap: 12 (ref 5–15)
BUN: 16 mg/dL (ref 6–20)
CO2: 24 mmol/L (ref 22–32)
Calcium: 8.7 mg/dL — ABNORMAL LOW (ref 8.9–10.3)
Chloride: 103 mmol/L (ref 98–111)
Creatinine, Ser: 0.93 mg/dL (ref 0.44–1.00)
GFR, Estimated: >60 mL/min (ref >60)
Glucose, Bld: 148 mg/dL — ABNORMAL HIGH (ref 70–99)
Potassium: 3.6 mmol/L (ref 3.5–5.1)
Sodium: 139 mmol/L (ref 135–145)
Total Bilirubin: 0.5 mg/dL (ref 0.0–1.2)
Total Protein: 6 g/dL — ABNORMAL LOW (ref 6.5–8.1)

## 2024-04-09 LAB — HEPATITIS PANEL, ACUTE
HCV Ab: NONREACTIVE
Hep A IgM: NONREACTIVE
Hep B C IgM: UNDETERMINED — AB
Hepatitis B Surface Ag: NONREACTIVE

## 2024-04-09 LAB — RPR: RPR Ser Ql: NONREACTIVE

## 2024-04-09 LAB — VITAMIN B12: Vitamin B-12: 341 pg/mL (ref 180–914)

## 2024-04-09 LAB — AMMONIA: Ammonia: 65 umol/L — ABNORMAL HIGH (ref 9–35)

## 2024-04-09 MED ORDER — BACLOFEN 10 MG PO TABS
5.0000 mg | ORAL_TABLET | Freq: Two times a day (BID) | ORAL | Status: DC
  Administered 2024-04-10 – 2024-04-11 (×3): 5 mg via ORAL
  Filled 2024-04-09 (×3): qty 1

## 2024-04-09 MED ORDER — LACTULOSE 10 GM/15ML PO SOLN
10.0000 g | Freq: Two times a day (BID) | ORAL | Status: DC
  Administered 2024-04-09 – 2024-04-11 (×5): 10 g via ORAL
  Filled 2024-04-09 (×5): qty 15

## 2024-04-09 NOTE — ED Notes (Addendum)
 Pt is argumentative regarding snack food choices. Cursing at staff members.  Pt educated that this behaviors are inappropriate. . Pt states I don't give a f*ck!  I am just talking to myself out loud. This nurse discussed other more appropriate ways to cope with situations.   Atarax  25 mg po prn administered

## 2024-04-09 NOTE — [facility] Group Notes (Signed)
 Spirituality Group   Description: Participant directed exploration of values, beliefs and meaning  **Focus on values, community, and sources of support  Following a brief framework of chaplain's role and ground rules of group behavior, participants are invited to share concerns or questions that engage spiritual life. Emphasis placed on common themes and shared experiences and ways to make meaning and clarify living into one's values.   Theory/Process/Goal: Utilize the theoretical framework of group therapy established by Celena Kite, Relational Cultural Theory and Rogerian approaches to facilitate relational empathy and use of the "here and now" to foster reflection, self-awareness, and sharing.   Observations: Shanitra was an active participant in the group discussion.  Kensey Luepke L. Delores HERO.Div
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
## 2024-04-09 NOTE — Care Management (Signed)
 [facility] Care Management   [facility] - Per [place], the referral has been received and the patient is under review.   [facility] and [facility] - Left HIPAA compliant voice mail message.

## 2024-04-09 NOTE — ED Notes (Signed)
 Pt observed and assessed in the nursing station. Pt denies SI/HI/AVH. Pt endorses anxiety, depression, complaining of chronic leg pain which she takes gabapentin  for.pt requested for her lidocaine  patch to be fixed properly for her. Staff got it fixed.

## 2024-04-09 NOTE — ED Notes (Signed)
 Pt currently sleeping. Q15 safety checks in place.

## 2024-04-09 NOTE — ED Notes (Signed)
 Pt is sleeping at the moment. No acute distress noted. Q15 safety checks in place.

## 2024-04-09 NOTE — ED Notes (Signed)
 Blood specimen collected on pt and sent to lab.

## 2024-04-09 NOTE — ED Notes (Signed)
 Pt is sleeping at the moment. No acute distress noted. Q15 safety checks in place per facility policy.

## 2024-04-09 NOTE — ED Notes (Signed)
 Pt complains of not seeing her bra. Staff went to both laundry rooms, pt locker and pt room and still couldn't find the bra. Pt has been informed.

## 2024-04-09 NOTE — ED Notes (Signed)
 [facility] was giving out snacks to patients after the AA Meeting, Patient got upset because there were no Rice Blair Mince, The patient said there were some last night, [facility] told patient there were no more. The patient got extremely upset and accused the [facility] of hiding the snacks, she then wanted every other snack the other patients had, explained she was given two, she accused the [facility] of racism? She was offered another snack, but she wanted two more, this went on for awhile, she got into an argument with another patient, then the nurse came in and talked to her, but she continued to rant about the snacks, finally she settled for one more snack. [facility] had explained to the patient what snacks were available to her, but she was very set on what she wanted.

## 2024-04-09 NOTE — ED Notes (Signed)
 Pt observed lying in bed. Eyes closed respirations even and non labored. NAD q 15 minute observations continue for safety.

## 2024-04-09 NOTE — ED Provider Notes (Signed)
 Behavioral Health Progress Note  Date and Time: 04/09/2024 4:41 PM Name: Andrea Sanford MRN:  [id number]  Subjective:  patient seen in her room this morning. She was difficult to interrupt. She engaged in Adult nurse and was often self contradictory. She is sleeping and eating okay. She has complaints about the food. She is still upset about the toilet paper and wants special wipes. Attempted to understand that this was not available and she was unable to internalize this. She is upset about not having her bunions shaved here, and wants special access to toenail care. She is also upset that she has not been able to get her PCP on the phone, and that she wants this doctor to call in and pressure this interviewer to increase her gabapentin  and baclofen  to very high levels, though she is clearly sedated now and slurring. She was not concerned about the elevated ammonia. Risks and benefits of lactulose  attempted, but she was not interested in talking about this.   Diagnosis:  Final diagnoses:  Polysubstance use disorder    Total Time spent with patient: 30 minutes  Past Psychiatric History: Polysubstance abuse, MDD, Bipolar, PTSD  Past Medical History: HTN, GIRD, DM, Polyneuropathy, Hypokalemia, ... Family History: Patient reports that her mother has similar mental problems and substance abuse Social History: Homeless. Lost custody of her children. Unemployed  Sleep: Good  Appetite:  Good  Current Medications:  Current Facility-Administered Medications  Medication Dose Route Frequency Provider Last Rate Last Admin   acetaminophen  (TYLENOL ) tablet 650 mg  650 mg Oral Q6H PRN Brent, Amanda C, NP   650 mg at 04/08/24 1235   albuterol  (VENTOLIN  HFA) 108 (90 Base) MCG/ACT inhaler 2 puff  2 puff Inhalation Q6H PRN Brent, Amanda C, NP       alum & mag hydroxide-simeth (MAALOX/MYLANTA) 200-200-20 MG/5ML suspension 30 mL  30 mL Oral Q4H PRN Brent, Amanda C, NP       amLODipine  (NORVASC ) tablet  5 mg  5 mg Oral Daily Brent, Amanda C, NP   5 mg at 04/09/24 0909   [START ON 04/10/2024] baclofen  (LIORESAL ) tablet 5 mg  5 mg Oral BID Leigh Corean Massa, MD       haloperidol  (HALDOL ) tablet 5 mg  5 mg Oral TID PRN Brent, Amanda C, NP       And   diphenhydrAMINE  (BENADRYL ) capsule 50 mg  50 mg Oral TID PRN Brent, Amanda C, NP       haloperidol  lactate (HALDOL ) injection 5 mg  5 mg Intramuscular TID PRN Brent, Amanda C, NP       And   diphenhydrAMINE  (BENADRYL ) injection 50 mg  50 mg Intramuscular TID PRN Brent, Amanda C, NP       And   LORazepam  (ATIVAN ) injection 2 mg  2 mg Intramuscular TID PRN Brent, Amanda C, NP       haloperidol  lactate (HALDOL ) injection 10 mg  10 mg Intramuscular TID PRN Brent, Amanda C, NP       And   diphenhydrAMINE  (BENADRYL ) injection 50 mg  50 mg Intramuscular TID PRN Brent, Amanda C, NP       And   LORazepam  (ATIVAN ) injection 2 mg  2 mg Intramuscular TID PRN Brent, Amanda C, NP       docusate sodium  (COLACE) capsule 100 mg  100 mg Oral Daily Rose Hippler, Corean Massa, MD       gabapentin  (NEURONTIN ) capsule 600 mg  600 mg Oral TID Byungura, Veronique M,  NP   600 mg at 04/09/24 1545   hydrOXYzine  (ATARAX ) tablet 25 mg  25 mg Oral TID PRN Brent, Amanda C, NP   25 mg at 04/09/24 1141   lactulose  (CHRONULAC ) 10 GM/15ML solution 10 g  10 g Oral BID Leigh Corean Massa, MD   10 g at 04/09/24 1307   lidocaine  (LIDODERM ) 5 % 1 patch  1 patch Transdermal Q24H Leigh Corean Massa, MD   1 patch at 04/08/24 1829   magnesium  hydroxide (MILK OF MAGNESIA) suspension 30 mL  30 mL Oral Daily PRN Brent, Amanda C, NP       nicotine  (NICODERM CQ  - dosed in mg/24 hours) patch 21 mg  21 mg Transdermal Daily Byungura, Veronique M, NP   21 mg at 04/09/24 9080   OLANZapine  (ZYPREXA ) injection 5 mg  5 mg Intramuscular TID PRN Hampton, Tracie B, NP       potassium chloride  SA (KLOR-CON  M) CR tablet 20 mEq  20 mEq Oral Daily Byungura, Veronique M, NP   20 mEq at 04/09/24 0919    QUEtiapine  (SEROQUEL ) tablet 400 mg  400 mg Oral QHS Hampton, Tracie B, NP   400 mg at 04/08/24 2135   traZODone  (DESYREL ) tablet 50 mg  50 mg Oral QHS PRN Brent, Amanda C, NP   50 mg at 04/08/24 2136   Current Outpatient Medications  Medication Sig Dispense Refill   albuterol  (VENTOLIN  HFA) 108 (90 Base) MCG/ACT inhaler Inhale 2 puffs into the lungs every 6 (six) hours as needed for wheezing or shortness of breath. 8 g 5   amLODipine  (NORVASC ) 5 MG tablet Take 1 tablet (5 mg total) by mouth daily. 90 tablet 1   Aspirin-Salicylamide-Caffeine  (ARTHRITIS STRENGTH BC POWDER PO) Take 3 packets by mouth every 6 (six) hours as needed (For pain).     atorvastatin  (LIPITOR) 40 MG tablet Take 40 mg by mouth daily.     baclofen  (LIORESAL ) 10 MG tablet Take 1 tablet (10 mg total) by mouth 3 (three) times daily. 90 each 1   gabapentin  (NEURONTIN ) 800 MG tablet Take 800 mg by mouth 3 (three) times daily.     meloxicam  (MOBIC ) 15 MG tablet Take 15 mg by mouth daily.     montelukast  (SINGULAIR ) 10 MG tablet Take 10 mg by mouth daily.     QUEtiapine  (SEROQUEL ) 400 MG tablet Take 1 tablet (400 mg total) by mouth at bedtime. 90 tablet 1   venlafaxine  XR (EFFEXOR -XR) 150 MG 24 hr capsule Take 150 mg by mouth daily.      Labs  Lab Results:  Admission on 04/07/2024  Component Date Value Ref Range Status   Sodium 04/08/2024 139  135 - 145 mmol/L Final   Potassium 04/08/2024 3.6  3.5 - 5.1 mmol/L Final   Chloride 04/08/2024 103  98 - 111 mmol/L Final   CO2 04/08/2024 24  22 - 32 mmol/L Final   Glucose, Bld 04/08/2024 148 (H)  70 - 99 mg/dL Final   Glucose reference range applies only to samples taken after fasting for at least 8 hours.   BUN 04/08/2024 16  6 - 20 mg/dL Final   Creatinine, Ser 04/08/2024 0.93  0.44 - 1.00 mg/dL Final   Calcium  04/08/2024 8.7 (L)  8.9 - 10.3 mg/dL Final   Total Protein 90/84/7974 6.0 (L)  6.5 - 8.1 g/dL Final   Albumin 90/84/7974 3.5  3.5 - 5.0 g/dL Final   AST 90/84/7974 17   15 - 41 U/L Final  ALT 04/08/2024 15  0 - 44 U/L Final   Alkaline Phosphatase 04/08/2024 104  38 - 126 U/L Final   Total Bilirubin 04/08/2024 0.5  0.0 - 1.2 mg/dL Final   GFR, Estimated 04/08/2024 >60  >60 mL/min Final   Comment: (NOTE) Calculated using the CKD-EPI Creatinine Equation (2021)    Anion gap 04/08/2024 12  5 - 15 Final   Performed at [facility], [address], [place], [place] [address]   Cholesterol 04/08/2024 121  0 - 200 mg/dL Final   Triglycerides 90/84/7974 270 (H)  <150 mg/dL Final   HDL 90/84/7974 39 (L)  >40 mg/dL Final   Total CHOL/HDL Ratio 04/08/2024 3.1  RATIO Final   VLDL 04/08/2024 54 (H)  0 - 40 mg/dL Final   LDL Cholesterol 04/08/2024 28  0 - 99 mg/dL Final   Comment:        Total Cholesterol/HDL:CHD Risk Coronary Heart Disease Risk Table                     Men   Women  1/2 Average Risk   3.4   3.3  Average Risk       5.0   4.4  2 X Average Risk   9.6   7.1  3 X Average Risk  23.4   11.0        Use the calculated Patient Ratio above and the CHD Risk Table to determine the patient's CHD Risk.        ATP III CLASSIFICATION (LDL):  <100     mg/dL   Optimal  899-870  mg/dL   Near or Above                    Optimal  130-159  mg/dL   Borderline  839-810  mg/dL   High  >809     mg/dL   Very High Performed at [facility], [address] [place], [place] [address]    TSH 04/08/2024 1.858  0.350 - 4.500 uIU/mL Final   Comment: Performed by a 3rd Generation assay with a functional sensitivity of <=0.01 uIU/mL. Performed at [facility], [address], [place], [place] [address]    Ammonia 04/08/2024 65 (H)  9 - 35 umol/L Final   Comment: POST-ULTRACENTRIFUGATION Performed at [facility], [address], [place], [place] [address]    Hepatitis B Surface Ag 04/08/2024 NON REACTIVE  NON REACTIVE Final   HCV Ab 04/08/2024 NON REACTIVE  NON REACTIVE Final   Comment: (NOTE) Nonreactive HCV antibody screen is consistent  with no HCV infections,  unless recent infection is suspected or other evidence exists to indicate HCV infection.     Hep A IgM 04/08/2024 NON REACTIVE  NON REACTIVE Final   Hep B C IgM 04/08/2024 EQUIVOCAL (A)  NON REACTIVE Final   Comment: REPEATED TO VERIFY (NOTE) Repeat testing was performed on the submitted specimen with equivocal  or uninterpretable results.  It is recommended that a new specimen be  obtained and retested. Performed at [facility], [address], [place], [place] [address]    RPR Ser Ql 04/08/2024 NON REACTIVE  NON REACTIVE Final   Performed at [facility], [address], [place], [place] [address]   Vit D, 25-Hydroxy 04/08/2024 39.25  30 - 100 ng/mL Final   Comment: (NOTE) Vitamin D  deficiency has been defined by the Institute of Medicine  and an Endocrine Society practice guideline as a level of serum 25-OH  vitamin D  less than 20 ng/mL (1,2). The Endocrine Society went on to  further define vitamin D  insufficiency as a level between 21 and 29  ng/mL (2).  1. IOM (Institute of Medicine). 2010. Dietary reference intakes for  calcium  and D. [place]: [employer]. 2. Holick MF, Bink[place], Bischoff-Ferrari HA, et al. Evaluation,  treatment, and prevention of vitamin D  deficiency: an Endocrine  Society clinical practice guideline, JCEM. 2011 Jul; 96(7): 1911-30.  Performed at [facility], [address] [place], [place] [address]    Vitamin B-12 04/08/2024 341  180 - 914 pg/mL Final   Comment: (NOTE) This assay is not validated for testing neonatal or myeloproliferative syndrome specimens for Vitamin B12 levels. Performed at [facility], [address] [place], [place] [address]   Admission on 03/28/2024, Discharged on 03/28/2024  Component Date Value Ref Range Status   Sodium 03/27/2024 141  135 - 145 mmol/L Final   Potassium 03/27/2024 3.2 (L)  3.5 - 5.1 mmol/L Final   Chloride 03/27/2024 103  98  - 111 mmol/L Final   CO2 03/27/2024 28  22 - 32 mmol/L Final   Glucose, Bld 03/27/2024 91  70 - 99 mg/dL Final   Glucose reference range applies only to samples taken after fasting for at least 8 hours.   BUN 03/27/2024 20  6 - 20 mg/dL Final   Creatinine, Ser 03/27/2024 0.86  0.44 - 1.00 mg/dL Final   Calcium  03/27/2024 8.8 (L)  8.9 - 10.3 mg/dL Final   GFR, Estimated 03/27/2024 >60  >60 mL/min Final   Comment: (NOTE) Calculated using the CKD-EPI Creatinine Equation (2021)    Anion gap 03/27/2024 10  5 - 15 Final   Performed at [facility], [address] [place], [place] [address]   WBC 03/27/2024 7.0  4.0 - 10.5 K/uL Final   RBC 03/27/2024 3.83 (L)  3.87 - 5.11 MIL/uL Final   Hemoglobin 03/27/2024 11.4 (L)  12.0 - 15.0 g/dL Final   HCT 90/96/7974 34.7 (L)  36.0 - 46.0 % Final   MCV 03/27/2024 90.6  80.0 - 100.0 fL Final   MCH 03/27/2024 29.8  26.0 - 34.0 pg Final   MCHC 03/27/2024 32.9  30.0 - 36.0 g/dL Final   RDW 90/96/7974 12.7  11.5 - 15.5 % Final   Platelets 03/27/2024 215  150 - 400 K/uL Final   nRBC 03/27/2024 0.0  0.0 - 0.2 % Final   Performed at [facility], [address] [place], [place] [address]   SARS Coronavirus 2 by RT PCR 03/28/2024 NEGATIVE  NEGATIVE Final   Comment: (NOTE) SARS-CoV-2 target nucleic acids are NOT DETECTED.  The SARS-CoV-2 RNA is generally detectable in upper respiratory specimens during the acute phase of infection. The lowest concentration of SARS-CoV-2 viral copies this assay can detect is 138 copies/mL. A negative result does not preclude SARS-Cov-2 infection and should not be used as the sole basis for treatment or other patient management decisions. A negative result may occur with  improper specimen collection/handling, submission of specimen other than nasopharyngeal swab, presence of viral mutation(s) within the areas targeted by this assay, and inadequate number of viral copies(<138 copies/mL). A negative  result must be combined with clinical observations, patient history, and epidemiological information. The expected result is Negative.  Fact Sheet for Patients:  BloggerCourse.com  Fact Sheet for Healthcare Providers:  SeriousBroker.it  This test is no                          t yet approved or cleared by the [place]  FDA and  has been authorized for detection and/or diagnosis of SARS-CoV-2 by FDA under an Emergency Use Authorization (EUA). This EUA will remain  in effect (meaning this test can be used) for the duration of the COVID-19 declaration under Section 564(b)(1) of the Act, 21 U.S.C.section 360bbb-3(b)(1), unless the authorization is terminated  or revoked sooner.       Influenza A by PCR 03/28/2024 NEGATIVE  NEGATIVE Final   Influenza B by PCR 03/28/2024 NEGATIVE  NEGATIVE Final   Comment: (NOTE) The Xpert Xpress SARS-CoV-2/FLU/RSV plus assay is intended as an aid in the diagnosis of influenza from Nasopharyngeal swab specimens and should not be used as a sole basis for treatment. Nasal washings and aspirates are unacceptable for Xpert Xpress SARS-CoV-2/FLU/RSV testing.  Fact Sheet for Patients: BloggerCourse.com  Fact Sheet for Healthcare Providers: SeriousBroker.it  This test is not yet approved or cleared by the [place]  FDA and has been authorized for detection and/or diagnosis of SARS-CoV-2 by FDA under an Emergency Use Authorization (EUA). This EUA will remain in effect (meaning this test can be used) for the duration of the COVID-19 declaration under Section 564(b)(1) of the Act, 21 U.S.C. section 360bbb-3(b)(1), unless the authorization is terminated or revoked.     Resp Syncytial Virus by PCR 03/28/2024 NEGATIVE  NEGATIVE Final   Comment: (NOTE) Fact Sheet for Patients: BloggerCourse.com  Fact Sheet for Healthcare  Providers: SeriousBroker.it  This test is not yet approved or cleared by the [place]  FDA and has been authorized for detection and/or diagnosis of SARS-CoV-2 by FDA under an Emergency Use Authorization (EUA). This EUA will remain in effect (meaning this test can be used) for the duration of the COVID-19 declaration under Section 564(b)(1) of the Act, 21 U.S.C. section 360bbb-3(b)(1), unless the authorization is terminated or revoked.  Performed at [facility], [address] [place], [place] [address]   Congregational Nurse Program on 02/15/2024  Component Date Value Ref Range Status   POC Glucose 02/15/2024 182 (A)  70 - 99 mg/dl Final   nonfasting  Admission on 02/11/2024, Discharged on 02/14/2024  Component Date Value Ref Range Status   Sodium 02/11/2024 140  135 - 145 mmol/L Final   Potassium 02/11/2024 3.8  3.5 - 5.1 mmol/L Final   Chloride 02/11/2024 104  98 - 111 mmol/L Final   CO2 02/11/2024 24  22 - 32 mmol/L Final   Glucose, Bld 02/11/2024 154 (H)  70 - 99 mg/dL Final   Glucose reference range applies only to samples taken after fasting for at least 8 hours.   BUN 02/11/2024 22 (H)  6 - 20 mg/dL Final   Creatinine, Ser 02/11/2024 0.86  0.44 - 1.00 mg/dL Final   Calcium  02/11/2024 9.9  8.9 - 10.3 mg/dL Final   Total Protein 92/79/7974 7.8  6.5 - 8.1 g/dL Final   Albumin 92/79/7974 4.2  3.5 - 5.0 g/dL Final   AST 92/79/7974 23  15 - 41 U/L Final   ALT 02/11/2024 14  0 - 44 U/L Final   Alkaline Phosphatase 02/11/2024 92  38 - 126 U/L Final   Total Bilirubin 02/11/2024 1.1  0.0 - 1.2 mg/dL Final   GFR, Estimated 02/11/2024 >60  >60 mL/min Final   Comment: (NOTE) Calculated  using the CKD-EPI Creatinine Equation (2021)    Anion gap 02/11/2024 12  5 - 15 Final   Performed at [facility], [address] [place], [place] [address]   Troponin I (High Sensitivity) 02/11/2024 4  <18 ng/L Final   Comment: (NOTE) Elevated  high sensitivity troponin I (hsTnI) values and significant  changes across serial measurements may suggest ACS but many other  chronic and acute conditions are known to elevate hsTnI results.  Refer to the Links section for chest pain algorithms and additional  guidance. Performed at [facility], [address] [place], [place] [address]    WBC 02/11/2024 11.1 (H)  4.0 - 10.5 K/uL Final   RBC 02/11/2024 5.38 (H)  3.87 - 5.11 MIL/uL Final   Hemoglobin 02/11/2024 16.0 (H)  12.0 - 15.0 g/dL Final   HCT 92/79/7974 48.2 (H)  36.0 - 46.0 % Final   MCV 02/11/2024 89.6  80.0 - 100.0 fL Final   MCH 02/11/2024 29.7  26.0 - 34.0 pg Final   MCHC 02/11/2024 33.2  30.0 - 36.0 g/dL Final   RDW 92/79/7974 13.2  11.5 - 15.5 % Final   Platelets 02/11/2024 224  150 - 400 K/uL Final   nRBC 02/11/2024 0.0  0.0 - 0.2 % Final   Neutrophils Relative % 02/11/2024 73  % Final   Neutro Abs 02/11/2024 8.2 (H)  1.7 - 7.7 K/uL Final   Lymphocytes Relative 02/11/2024 19  % Final   Lymphs Abs 02/11/2024 2.1  0.7 - 4.0 K/uL Final   Monocytes Relative 02/11/2024 6  % Final   Monocytes Absolute 02/11/2024 0.7  0.1 - 1.0 K/uL Final   Eosinophils Relative 02/11/2024 1  % Final   Eosinophils Absolute 02/11/2024 0.1  0.0 - 0.5 K/uL Final   Basophils Relative 02/11/2024 1  % Final   Basophils Absolute 02/11/2024 0.1  0.0 - 0.1 K/uL Final   Immature Granulocytes 02/11/2024 0  % Final   Abs Immature Granulocytes 02/11/2024 0.04  0.00 - 0.07 K/uL Final   Performed at [facility], [address] [place], [place] [address]   Prothrombin Time 02/11/2024 14.1  11.4 - 15.2 seconds Final   INR 02/11/2024 1.0  0.8 - 1.2 Final   Comment: (NOTE) INR goal varies based on device and disease states. Performed at [facility], [address] [place], [place] [address]    Color, Urine 02/11/2024 STRAW (A)  YELLOW Final   APPearance 02/11/2024 CLEAR (A)  CLEAR Final   Specific Gravity, Urine  02/11/2024 1.009  1.005 - 1.030 Final   pH 02/11/2024 5.0  5.0 - 8.0 Final   Glucose, UA 02/11/2024 NEGATIVE  NEGATIVE mg/dL Final   Hgb urine dipstick 02/11/2024 NEGATIVE  NEGATIVE Final   Bilirubin Urine 02/11/2024 NEGATIVE  NEGATIVE Final   Ketones, ur 02/11/2024 5 (A)  NEGATIVE mg/dL Final   Protein, ur 92/79/7974 NEGATIVE  NEGATIVE mg/dL Final   Nitrite 92/79/7974 NEGATIVE  NEGATIVE Final   Leukocytes,Ua 02/11/2024 NEGATIVE  NEGATIVE Final   Performed at [facility], [address] [place], [place] [address]   Tricyclic, Ur Screen 02/11/2024 NONE DETECTED  NONE DETECTED Final   Amphetamines, Ur Screen 02/11/2024 NONE DETECTED  NONE DETECTED Final   MDMA (Ecstasy)Ur Screen 02/11/2024 NONE DETECTED  NONE DETECTED Final   Cocaine Metabolite,Ur [place] 02/11/2024 POSITIVE (A)  NONE DETECTED Final   Opiate, Ur Screen 02/11/2024 NONE DETECTED  NONE DETECTED Final   Phencyclidine (PCP) Ur S  02/11/2024 NONE DETECTED  NONE DETECTED Final   Cannabinoid 50 Ng, Ur [place] 02/11/2024 POSITIVE (A)  NONE DETECTED Final   Barbiturates, Ur Screen 02/11/2024 NONE DETECTED  NONE DETECTED Final   Benzodiazepine, Ur Scrn 02/11/2024 NONE DETECTED  NONE DETECTED Final   Methadone Scn, Ur 02/11/2024 NONE DETECTED  NONE DETECTED Final   Comment: (NOTE) Tricyclics + metabolites, urine    Cutoff 1000 ng/mL Amphetamines + metabolites, urine  Cutoff 1000 ng/mL MDMA (Ecstasy), urine              Cutoff 500 ng/mL Cocaine Metabolite, urine          Cutoff 300 ng/mL Opiate + metabolites, urine        Cutoff 300 ng/mL Phencyclidine (PCP), urine         Cutoff 25 ng/mL Cannabinoid, urine                 Cutoff 50 ng/mL Barbiturates + metabolites, urine  Cutoff 200 ng/mL Benzodiazepine, urine              Cutoff 200 ng/mL Methadone, urine                   Cutoff 300 ng/mL  The urine drug screen provides only a preliminary, unconfirmed analytical test result and should not be used for non-medical purposes.  Clinical consideration and professional judgment should be applied to any positive drug screen result due to possible interfering substances. A more specific alternate chemical method must be used in order to obtain a confirmed analytical result. Gas chromatography / mass spectrometry (GC/MS) is the preferred confirm                          atory method. Performed at [facility], [address] [place], [place] [address]    FIO2 02/11/2024 0.24  % Final   Delivery systems 02/11/2024 VENTILATOR   Final   Mode 02/11/2024 PRESSURE REGULATED VOLUME CONTROL   Final   MECHVT 02/11/2024 500  mL Final   RATE 02/11/2024 18  resp/min Final   PEEP 02/11/2024 5.0  cm H20 Final   pH, Arterial 02/11/2024 7.39  7.35 - 7.45 Final   pCO2 arterial 02/11/2024 40  32 - 48 mmHg Final   pO2, Arterial 02/11/2024 86  83 - 108 mmHg Final   Bicarbonate 02/11/2024 24.2  20.0 - 28.0 mmol/L Final   Acid-base deficit 02/11/2024 0.7  0.0 - 2.0 mmol/L Final   O2 Saturation 02/11/2024 97.7  % Final   Patient temperature 02/11/2024 37.0   Final   Collection site 02/11/2024 LEFT BRACHIAL   Final   Allens test (pass/fail) 02/11/2024 PASS  PASS Final   Performed at [facility], [address] [place], [place] [address]   Acetaminophen  (Tylenol ), Serum 02/11/2024 <10 (L)  10 - 30 ug/mL Final   Comment: (NOTE) Therapeutic concentrations vary significantly. A range of 10-30 ug/mL  may be an effective concentration for many patients. However, some  are best treated at concentrations outside of this range. Acetaminophen  concentrations >150 ug/mL at 4 hours after ingestion  and >50 ug/mL at 12 hours after ingestion are often associated with  toxic reactions.  Performed at [facility], [address] [place], [place] [address]    Salicylate Lvl 02/11/2024 <7.0 (L)  7.0 - 30.0 mg/dL Final   Performed at [facility], [address] [place], [place] [address]   Alcohol,  Ethyl (B) 02/11/2024 <15  <15 mg/dL Final   Comment: (NOTE) For medical purposes only. Performed at [facility], [address] [place], [place] [address]    ABO/RH(D) 02/11/2024 O POS   Final   Antibody Screen 02/11/2024 NEG   Final   Sample Expiration 02/11/2024    Final                   Value:02/14/2024,2359 Performed at [facility], [address] [place], [place] [address]    Salicylate Lvl 02/11/2024 <7.0 (L)  7.0 - 30.0 mg/dL Final   Performed at [facility], [address] [place], [place] [address]   Troponin I (High Sensitivity) 02/11/2024 5  <18 ng/L Final   Comment: (NOTE) Elevated high sensitivity troponin I (hsTnI) values and significant  changes across serial measurements may suggest ACS but many other  chronic and acute conditions are known to elevate hsTnI results.  Refer to the Links section for chest pain algorithms and additional  guidance. Performed at [facility], [address] [place], [place] [address]    MRSA by PCR Next Gen 02/11/2024 NOT DETECTED  NOT DETECTED Final   Comment: (NOTE) The GeneXpert MRSA Assay (FDA approved for NASAL specimens only), is one component of a comprehensive MRSA colonization surveillance program. It is not intended to diagnose MRSA infection nor to guide or monitor treatment for MRSA infections. Test performance is not FDA approved in patients less than 51 years old. Performed at [facility], [address], [place], [place] [address]    Specimen Description 02/11/2024    Final                   Value:TRACHEAL ASPIRATE Performed at [facility], [address] [place], [place] [address]    Special Requests 02/11/2024    Final                   Value:NONE Performed at [facility], [address] [place], [place] [address]    Gram Stain 02/11/2024    Final                   Value:RARE WBC PRESENT, PREDOMINANTLY PMN MODERATE GRAM NEGATIVE  RODS RARE GRAM POSITIVE COCCI Performed at [facility], [address], [place], [place] [address]    Culture 02/11/2024    Final                   Value:ABUNDANT HAEMOPHILUS INFLUENZAE BETA LACTAMASE NEGATIVE FEW STAPHYLOCOCCUS AUREUS    Report Status 02/11/2024 02/16/2024 FINAL   Final   Organism ID, Bacteria 02/11/2024 STAPHYLOCOCCUS AUREUS   Final   SARS Coronavirus 2 by RT PCR 02/11/2024 NEGATIVE  NEGATIVE Final   Comment: (NOTE) SARS-CoV-2 target nucleic acids are NOT DETECTED.  The SARS-CoV-2 RNA is generally detectable in upper respiratory specimens during the acute phase of infection. The lowest concentration of SARS-CoV-2 viral copies this assay can detect is 138 copies/mL. A negative result does not preclude SARS-Cov-2 infection and should not be used as the sole basis for treatment or other patient management decisions. A negative result may occur with  improper specimen collection/handling, submission of specimen other than nasopharyngeal swab, presence of viral mutation(s) within the areas targeted by this assay, and inadequate number of viral copies(<138 copies/mL). A negative result must be combined with clinical observations, patient history, and epidemiological information. The expected result is Negative.  Fact  Sheet for Patients:  BloggerCourse.com  Fact Sheet for Healthcare Providers:  SeriousBroker.it  This test is no                          t yet approved or cleared by the [place]  FDA and  has been authorized for detection and/or diagnosis of SARS-CoV-2 by FDA under an Emergency Use Authorization (EUA). This EUA will remain  in effect (meaning this test can be used) for the duration of the COVID-19 declaration under Section 564(b)(1) of the Act, 21 U.S.C.section 360bbb-3(b)(1), unless the authorization is terminated  or revoked sooner.       Influenza A by PCR 02/11/2024 NEGATIVE   NEGATIVE Final   Influenza B by PCR 02/11/2024 NEGATIVE  NEGATIVE Final   Comment: (NOTE) The Xpert Xpress SARS-CoV-2/FLU/RSV plus assay is intended as an aid in the diagnosis of influenza from Nasopharyngeal swab specimens and should not be used as a sole basis for treatment. Nasal washings and aspirates are unacceptable for Xpert Xpress SARS-CoV-2/FLU/RSV testing.  Fact Sheet for Patients: BloggerCourse.com  Fact Sheet for Healthcare Providers: SeriousBroker.it  This test is not yet approved or cleared by the [place]  FDA and has been authorized for detection and/or diagnosis of SARS-CoV-2 by FDA under an Emergency Use Authorization (EUA). This EUA will remain in effect (meaning this test can be used) for the duration of the COVID-19 declaration under Section 564(b)(1) of the Act, 21 U.S.C. section 360bbb-3(b)(1), unless the authorization is terminated or revoked.     Resp Syncytial Virus by PCR 02/11/2024 NEGATIVE  NEGATIVE Final   Comment: (NOTE) Fact Sheet for Patients: BloggerCourse.com  Fact Sheet for Healthcare Providers: SeriousBroker.it  This test is not yet approved or cleared by the [place]  FDA and has been authorized for detection and/or diagnosis of SARS-CoV-2 by FDA under an Emergency Use Authorization (EUA). This EUA will remain in effect (meaning this test can be used) for the duration of the COVID-19 declaration under Section 564(b)(1) of the Act, 21 U.S.C. section 360bbb-3(b)(1), unless the authorization is terminated or revoked.  Performed at [facility], [address] [place], [place] [address]    Adenovirus 02/11/2024 NOT DETECTED  NOT DETECTED Final   Coronavirus 229E 02/11/2024 NOT DETECTED  NOT DETECTED Final   Comment: (NOTE) The Coronavirus on the Respiratory Panel, DOES NOT test for the novel  Coronavirus (2019 nCoV)     Coronavirus HKU1 02/11/2024 NOT DETECTED  NOT DETECTED Final   Coronavirus NL63 02/11/2024 NOT DETECTED  NOT DETECTED Final   Coronavirus OC43 02/11/2024 NOT DETECTED  NOT DETECTED Final   Metapneumovirus 02/11/2024 NOT DETECTED  NOT DETECTED Final   Rhinovirus / Enterovirus 02/11/2024 NOT DETECTED  NOT DETECTED Final   Influenza A 02/11/2024 NOT DETECTED  NOT DETECTED Final   Influenza B 02/11/2024 NOT DETECTED  NOT DETECTED Final   Parainfluenza Virus 1 02/11/2024 NOT DETECTED  NOT DETECTED Final   Parainfluenza Virus 2 02/11/2024 NOT DETECTED  NOT DETECTED Final   Parainfluenza Virus 3 02/11/2024 NOT DETECTED  NOT DETECTED Final   Parainfluenza Virus 4 02/11/2024 NOT DETECTED  NOT DETECTED Final   Respiratory Syncytial Virus 02/11/2024 NOT DETECTED  NOT DETECTED Final   Bordetella pertussis 02/11/2024 NOT DETECTED  NOT DETECTED Final   Bordetella Parapertussis 02/11/2024 NOT DETECTED  NOT DETECTED Final   Chlamydophila pneumoniae 02/11/2024 NOT DETECTED  NOT DETECTED Final   Mycoplasma pneumoniae 02/11/2024 NOT DETECTED  NOT DETECTED Final  Performed at [facility], [address], [place], [place] [address]   Glucose-Capillary 02/11/2024 161 (H)  70 - 99 mg/dL Final   Glucose reference range applies only to samples taken after fasting for at least 8 hours.   WBC 02/12/2024 10.2  4.0 - 10.5 K/uL Final   RBC 02/12/2024 4.69  3.87 - 5.11 MIL/uL Final   Hemoglobin 02/12/2024 14.0  12.0 - 15.0 g/dL Final   HCT 92/78/7974 42.2  36.0 - 46.0 % Final   MCV 02/12/2024 90.0  80.0 - 100.0 fL Final   MCH 02/12/2024 29.9  26.0 - 34.0 pg Final   MCHC 02/12/2024 33.2  30.0 - 36.0 g/dL Final   RDW 92/78/7974 13.6  11.5 - 15.5 % Final   Platelets 02/12/2024 194  150 - 400 K/uL Final   nRBC 02/12/2024 0.0  0.0 - 0.2 % Final   Performed at [facility], [address] [place], [place] [address]   Sodium 02/12/2024 144  135 - 145 mmol/L Final   Potassium 02/12/2024 3.1 (L)  3.5 - 5.1  mmol/L Final   Chloride 02/12/2024 110  98 - 111 mmol/L Final   CO2 02/12/2024 23  22 - 32 mmol/L Final   Glucose, Bld 02/12/2024 113 (H)  70 - 99 mg/dL Final   Glucose reference range applies only to samples taken after fasting for at least 8 hours.   BUN 02/12/2024 22 (H)  6 - 20 mg/dL Final   Creatinine, Ser 02/12/2024 0.76  0.44 - 1.00 mg/dL Final   Calcium  02/12/2024 9.2  8.9 - 10.3 mg/dL Final   GFR, Estimated 02/12/2024 >60  >60 mL/min Final   Comment: (NOTE) Calculated using the CKD-EPI Creatinine Equation (2021)    Anion gap 02/12/2024 14  5 - 15 Final   Performed at [facility], [address] [place], [place] [address]   FIO2 02/12/2024 24  % Final   MECHVT 02/12/2024 500  mL Final   PEEP 02/12/2024 5  cm H20 Final   pH, Arterial 02/12/2024 7.48 (H)  7.35 - 7.45 Final   pCO2 arterial 02/12/2024 34  32 - 48 mmHg Final   pO2, Arterial 02/12/2024 75 (L)  83 - 108 mmHg Final   Bicarbonate 02/12/2024 25.3  20.0 - 28.0 mmol/L Final   Acid-Base Excess 02/12/2024 2.2 (H)  0.0 - 2.0 mmol/L Final   O2 Saturation 02/12/2024 95.5  % Final   Patient temperature 02/12/2024 37.0   Final   Collection site 02/12/2024 RIGHT RADIAL   Final   Allens test (pass/fail) 02/12/2024 PASS  PASS Final   Mechanical Rate 02/12/2024 18   Final   Performed at [facility], [address] [place], [place] [address]   Magnesium  02/12/2024 2.0  1.7 - 2.4 mg/dL Final   Performed at [facility], [address] [place], [place] [address]   Phosphorus 02/12/2024 3.4  2.5 - 4.6 mg/dL Final   Performed at [facility], [address] [place], [place] [address]   Triglycerides 02/12/2024 154 (H)  <150 mg/dL Final   Performed at [facility], [address] [place], [place] [address]   Glucose-Capillary 02/11/2024 144 (H)  70 - 99 mg/dL Final   Glucose reference range applies only to samples taken after fasting for at least 8 hours.   Glucose-Capillary  02/11/2024 128 (H)  70 - 99 mg/dL Final   Glucose reference range applies only to samples taken after fasting for  at least 8 hours.   Glucose-Capillary 02/12/2024 116 (H)  70 - 99 mg/dL Final   Glucose reference range applies only to samples taken after fasting for at least 8 hours.   Glucose-Capillary 02/12/2024 125 (H)  70 - 99 mg/dL Final   Glucose reference range applies only to samples taken after fasting for at least 8 hours.   Glucose-Capillary 02/12/2024 108 (H)  70 - 99 mg/dL Final   Glucose reference range applies only to samples taken after fasting for at least 8 hours.   Glucose-Capillary 02/12/2024 143 (H)  70 - 99 mg/dL Final   Glucose reference range applies only to samples taken after fasting for at least 8 hours.   Triglycerides 02/13/2024 103  <150 mg/dL Final   Performed at [facility], [address] [place], [place] [address]   Magnesium  02/13/2024 2.2  1.7 - 2.4 mg/dL Final   Performed at [facility], [address] [place], [place] [address]   Phosphorus 02/13/2024 3.0  2.5 - 4.6 mg/dL Final   Performed at [facility], [address] [place], [place] [address]   Sodium 02/13/2024 145  135 - 145 mmol/L Final   Potassium 02/13/2024 3.3 (L)  3.5 - 5.1 mmol/L Final   Chloride 02/13/2024 110  98 - 111 mmol/L Final   CO2 02/13/2024 25  22 - 32 mmol/L Final   Glucose, Bld 02/13/2024 139 (H)  70 - 99 mg/dL Final   Glucose reference range applies only to samples taken after fasting for at least 8 hours.   BUN 02/13/2024 32 (H)  6 - 20 mg/dL Final   Creatinine, Ser 02/13/2024 0.93  0.44 - 1.00 mg/dL Final   Calcium  02/13/2024 9.0  8.9 - 10.3 mg/dL Final   GFR, Estimated 02/13/2024 >60  >60 mL/min Final   Comment: (NOTE) Calculated using the CKD-EPI Creatinine Equation (2021)    Anion gap 02/13/2024 10  5 - 15 Final   Performed at [facility], [address] [place], [place] [address]   Glucose-Capillary 02/12/2024 126 (H)  70  - 99 mg/dL Final   Glucose reference range applies only to samples taken after fasting for at least 8 hours.   Glucose-Capillary 02/12/2024 133 (H)  70 - 99 mg/dL Final   Glucose reference range applies only to samples taken after fasting for at least 8 hours.   Glucose-Capillary 02/13/2024 149 (H)  70 - 99 mg/dL Final   Glucose reference range applies only to samples taken after fasting for at least 8 hours.   Glucose-Capillary 02/13/2024 154 (H)  70 - 99 mg/dL Final   Glucose reference range applies only to samples taken after fasting for at least 8 hours.   Glucose-Capillary 02/13/2024 167 (H)  70 - 99 mg/dL Final   Glucose reference range applies only to samples taken after fasting for at least 8 hours.   Glucose-Capillary 02/13/2024 87  70 - 99 mg/dL Final   Glucose reference range applies only to samples taken after fasting for at least 8 hours.   Magnesium  02/14/2024 2.0  1.7 - 2.4 mg/dL Final   Performed at [facility], [address] [place], [place] [address]   Phosphorus 02/14/2024 2.6  2.5 - 4.6 mg/dL Final   Performed at [facility], [address] [place], [place] [address]   Sodium 02/14/2024 143  135 - 145 mmol/L Final   Potassium 02/14/2024 3.5  3.5 - 5.1 mmol/L Final   Chloride 02/14/2024  111  98 - 111 mmol/L Final   CO2 02/14/2024 24  22 - 32 mmol/L Final   Glucose, Bld 02/14/2024 116 (H)  70 - 99 mg/dL Final   Glucose reference range applies only to samples taken after fasting for at least 8 hours.   BUN 02/14/2024 23 (H)  6 - 20 mg/dL Final   Creatinine, Ser 02/14/2024 0.72  0.44 - 1.00 mg/dL Final   Calcium  02/14/2024 8.3 (L)  8.9 - 10.3 mg/dL Final   GFR, Estimated 02/14/2024 >60  >60 mL/min Final   Comment: (NOTE) Calculated using the CKD-EPI Creatinine Equation (2021)    Anion gap 02/14/2024 8  5 - 15 Final   Performed at [facility], [address] [place], [place] [address]   Glucose-Capillary 02/13/2024 93  70 - 99 mg/dL  Final   Glucose reference range applies only to samples taken after fasting for at least 8 hours.   Glucose-Capillary 02/13/2024 108 (H)  70 - 99 mg/dL Final   Glucose reference range applies only to samples taken after fasting for at least 8 hours.   Glucose-Capillary 02/14/2024 113 (H)  70 - 99 mg/dL Final   Glucose reference range applies only to samples taken after fasting for at least 8 hours.   Glucose-Capillary 02/14/2024 86  70 - 99 mg/dL Final   Glucose reference range applies only to samples taken after fasting for at least 8 hours.  Refill on 12/08/2023  Component Date Value Ref Range Status   Rapid Strep A Screen 12/08/2023 Negative  Negative Final  Admission on 11/21/2023, Discharged on 11/21/2023  Component Date Value Ref Range Status   WBC 11/21/2023 8.2  4.0 - 10.5 K/uL Final   RBC 11/21/2023 4.03  3.87 - 5.11 MIL/uL Final   Hemoglobin 11/21/2023 11.9 (L)  12.0 - 15.0 g/dL Final   HCT 95/70/7974 36.6  36.0 - 46.0 % Final   MCV 11/21/2023 90.8  80.0 - 100.0 fL Final   MCH 11/21/2023 29.5  26.0 - 34.0 pg Final   MCHC 11/21/2023 32.5  30.0 - 36.0 g/dL Final   RDW 95/70/7974 13.0  11.5 - 15.5 % Final   Platelets 11/21/2023 223  150 - 400 K/uL Final   nRBC 11/21/2023 0.0  0.0 - 0.2 % Final   Performed at [facility], [address] [place], [place] [address]   Sodium 11/21/2023 142  135 - 145 mmol/L Final   Potassium 11/21/2023 3.1 (L)  3.5 - 5.1 mmol/L Final   Chloride 11/21/2023 112 (H)  98 - 111 mmol/L Final   CO2 11/21/2023 24  22 - 32 mmol/L Final   Glucose, Bld 11/21/2023 144 (H)  70 - 99 mg/dL Final   Glucose reference range applies only to samples taken after fasting for at least 8 hours.   BUN 11/21/2023 12  6 - 20 mg/dL Final   Creatinine, Ser 11/21/2023 0.82  0.44 - 1.00 mg/dL Final   Calcium  11/21/2023 8.4 (L)  8.9 - 10.3 mg/dL Final   Total Protein 95/70/7974 6.4 (L)  6.5 - 8.1 g/dL Final   Albumin 95/70/7974 3.5  3.5 - 5.0 g/dL Final   AST  95/70/7974 16  15 - 41 U/L Final   ALT 11/21/2023 11  0 - 44 U/L Final   Alkaline Phosphatase 11/21/2023 84  38 - 126 U/L Final   Total Bilirubin 11/21/2023 <0.2  0.0 - 1.2 mg/dL Final   GFR, Estimated 11/21/2023 >60  >60 mL/min Final  Comment: (NOTE) Calculated using the CKD-EPI Creatinine Equation (2021)    Anion gap 11/21/2023 6  5 - 15 Final   Performed at [facility], [address] [place], [place] [address]   Alcohol, Ethyl (B) 11/21/2023 69 (H)  <15 mg/dL Final   Comment: Please note change in reference range. (NOTE) For medical purposes only. Performed at [facility], [address] [place], [place] [address]   Office Visit on 11/03/2023  Component Date Value Ref Range Status   COLOGUARD 11/18/2023 Sample Could Not Be Processed 9  N/A Final   The Cologuard (TM) test was assigned to this specimen.  An empty collection kit was received in the laboratory. The patient will be contacted to initiate a new sample collection.  Admission on 10/15/2023, Discharged on 10/15/2023  Component Date Value Ref Range Status   SARS Coronavirus 2 by RT PCR 10/15/2023 NEGATIVE  NEGATIVE Final   Comment: (NOTE) SARS-CoV-2 target nucleic acids are NOT DETECTED.  The SARS-CoV-2 RNA is generally detectable in upper respiratory specimens during the acute phase of infection. The lowest concentration of SARS-CoV-2 viral copies this assay can detect is 138 copies/mL. A negative result does not preclude SARS-Cov-2 infection and should not be used as the sole basis for treatment or other patient management decisions. A negative result may occur with  improper specimen collection/handling, submission of specimen other than nasopharyngeal swab, presence of viral mutation(s) within the areas targeted by this assay, and inadequate number of viral copies(<138 copies/mL). A negative result must be combined with clinical observations, patient history, and  epidemiological information. The expected result is Negative.  Fact Sheet for Patients:  BloggerCourse.com  Fact Sheet for Healthcare Providers:  SeriousBroker.it  This test is no                          t yet approved or cleared by the [place]  FDA and  has been authorized for detection and/or diagnosis of SARS-CoV-2 by FDA under an Emergency Use Authorization (EUA). This EUA will remain  in effect (meaning this test can be used) for the duration of the COVID-19 declaration under Section 564(b)(1) of the Act, 21 U.S.C.section 360bbb-3(b)(1), unless the authorization is terminated  or revoked sooner.       Influenza A by PCR 10/15/2023 NEGATIVE  NEGATIVE Final   Influenza B by PCR 10/15/2023 NEGATIVE  NEGATIVE Final   Comment: (NOTE) The Xpert Xpress SARS-CoV-2/FLU/RSV plus assay is intended as an aid in the diagnosis of influenza from Nasopharyngeal swab specimens and should not be used as a sole basis for treatment. Nasal washings and aspirates are unacceptable for Xpert Xpress SARS-CoV-2/FLU/RSV testing.  Fact Sheet for Patients: BloggerCourse.com  Fact Sheet for Healthcare Providers: SeriousBroker.it  This test is not yet approved or cleared by the [place]  FDA and has been authorized for detection and/or diagnosis of SARS-CoV-2 by FDA under an Emergency Use Authorization (EUA). This EUA will remain in effect (meaning this test can be used) for the duration of the COVID-19 declaration under Section 564(b)(1) of the Act, 21 U.S.C. section 360bbb-3(b)(1), unless the authorization is terminated or revoked.     Resp Syncytial Virus by PCR 10/15/2023 NEGATIVE  NEGATIVE Final   Comment: (NOTE) Fact Sheet for Patients: BloggerCourse.com  Fact Sheet for Healthcare Providers: SeriousBroker.it  This test is not yet  approved or cleared by the [place]  FDA and has been authorized for detection and/or diagnosis of SARS-CoV-2 by  FDA under an Emergency Use Authorization (EUA). This EUA will remain in effect (meaning this test can be used) for the duration of the COVID-19 declaration under Section 564(b)(1) of the Act, 21 U.S.C. section 360bbb-3(b)(1), unless the authorization is terminated or revoked.  Performed at [facility], [address] [place], [place] [address]     Blood Alcohol level:  Lab Results  Component Value Date   [facility] <15 04/06/2024   [facility] <15 02/11/2024    Metabolic Disorder Labs: Lab Results  Component Value Date   HGBA1C 6.6 (A) 10/03/2023   MPG 148.46 09/14/2023   No results found for: PROLACTIN Lab Results  Component Value Date   CHOL 121 04/08/2024   TRIG 270 (H) 04/08/2024   HDL 39 (L) 04/08/2024   CHOLHDL 3.1 04/08/2024   VLDL 54 (H) 04/08/2024   LDLCALC 28 04/08/2024   LDLCALC 137 (H) 10/03/2023    Therapeutic Lab Levels: No results found for: LITHIUM No results found for: VALPROATE No results found for: CBMZ  Physical Findings   GAD-7    Flowsheet Row Office Visit from 09/29/2023 in [facility] Office Visit from 06/13/2023 in [facility] Counselor from 08/29/2017 in [facility]  Total GAD-7 Score 10 16 19    PHQ2-9    Flowsheet Row ED from 04/07/2024 in [facility] Patient Outreach Telephone from 04/02/2024 in [facility] Office Visit from 09/29/2023 in [facility] Office Visit from 09/22/2023 in [facility] Office Visit from 06/13/2023 in [facility]  PHQ-2 Total Score 6 3 3 4 3   PHQ-9 Total Score 24 11 16 18 11    Flowsheet Row ED from 04/07/2024 in [facility] ED from 03/28/2024 in [facility] Emergency  Department at [facility] ED from 11/21/2023 in [facility] Emergency Department at [facility]  C-SSRS RISK CATEGORY Low Risk No Risk No Risk     Musculoskeletal  Strength & Muscle Tone: within normal limits Gait & Station: normal Patient leans: N/A  Psychiatric Specialty Exam  Presentation  General Appearance:  Disheveled  Eye Contact: Fair  Speech: Slurred  Speech Volume: Normal  Handedness: Right   Mood and Affect  Mood: Labile; Dysphoric  Affect: Labile   Thought Process  Thought Processes: Irrevelant  Descriptions of Associations:Loose  Orientation:Full (Time, Place and Person)  Thought Content:Illogical; Perseveration  Diagnosis of Schizophrenia or Schizoaffective disorder in past: No    Hallucinations:Hallucinations: None  Ideas of Reference:None  Suicidal Thoughts:Suicidal Thoughts: No  Homicidal Thoughts:Homicidal Thoughts: No   Sensorium  Memory: Immediate Poor; Remote Poor  Judgment: Poor  Insight: Poor   Executive Functions  Concentration: Poor  Attention Span: Poor  Recall: Poor  Fund of Knowledge: Fair  Language: Fair   Psychomotor Activity  Psychomotor Activity: Psychomotor Activity: Normal   Assets  Assets: Leisure Time   Sleep  Sleep: Sleep: Good  Estimated Sleeping Duration (Last 24 Hours): 10.25-10.75 hours  No data recorded  Physical Exam  Physical Exam Vitals and nursing note reviewed.  HENT:     Head: Normocephalic.  Eyes:     Extraocular Movements: Extraocular movements intact.  Pulmonary:     Effort: Pulmonary effort is normal.  Musculoskeletal:        General: Normal range of motion.     Cervical back: Normal range of motion.  Neurological:     Mental Status:  She is alert and oriented to person, place, and time.  Psychiatric:        Attention and Perception: She is inattentive.        Mood and Affect: Affect is tearful.    Review of Systems  Constitutional:   Negative for fever.  Respiratory:  Negative for cough and shortness of breath.   Cardiovascular:  Negative for chest pain.  Gastrointestinal:  Negative for constipation, diarrhea, nausea and vomiting.       Rectal pain  Genitourinary:  Negative for dysuria.  Musculoskeletal:  Positive for joint pain and myalgias.  Skin:        Dry skin on feet. +bunions  Psychiatric/Behavioral:  Negative for hallucinations and suicidal ideas.    Blood pressure 126/89, pulse 94, temperature 98.2 F (36.8 C), temperature source Oral, resp. rate 18, SpO2 96%. There is no height or weight on file to calculate BMI.  Treatment Plan Summary: Long Term Goals: Improvement in symptoms so as ready for discharge   Short Term Goals: Patient will verbalize feelings in meetings with treatment team members., Patient will attend at least of 50% of the groups daily., Pt will complete the PHQ9 on admission, day 3 and discharge., and Patient will take medications as prescribed daily.   Medications: Mood/anxiety: continue group therapy, milieu therapy, 1:1 evaluation with provider.  Medication management: seroquel  400mg  PO at bedtime, gabapentin  600mg  PO TID  Substance Abuse:  brief intervention provided abstinence advised.  stimulant use: encourage patient to maintain PO intake. Coverage PRN for secondary psychosis.  Nicotine  and tobacco use: Nicotine  replacement therapy provided.  counseling on cessation of nicotine  use provided Medical: PRNs for pain, constipation, indigestion available.  Labs/studies: ammonia level 65. Starting lactulose  10mg  PO BID Tapering baclofen  with plan to discontinue due to slurring, appearing intoxicated.  Safety and Monitoring: voluntarily admission to [facility]/Facility based care unit [facility]) unit for safety, stabilization and treatment Daily contact with patient to assess and evaluate symptoms and progress in treatment Patient's case to be discussed in multi-disciplinary team  meeting Observation Level : q15 minute checks Vital signs: q12 hours Precautions: withdrawal and fall   Based on my evaluation the patient does not appear to have an emergency medical condition.   Corean Anette Potters, MD 04/09/2024 4:41 PM

## 2024-04-09 NOTE — ED Notes (Addendum)
 Pt requests only her mother, Roderick Cheese.  know she is here.

## 2024-04-10 DIAGNOSIS — G2401 Drug induced subacute dyskinesia: Secondary | ICD-10-CM | POA: Diagnosis present

## 2024-04-10 DIAGNOSIS — I10 Essential (primary) hypertension: Secondary | ICD-10-CM | POA: Diagnosis not present

## 2024-04-10 DIAGNOSIS — Z765 Malingerer [conscious simulation]: Secondary | ICD-10-CM

## 2024-04-10 DIAGNOSIS — F6089 Other specific personality disorders: Secondary | ICD-10-CM | POA: Diagnosis present

## 2024-04-10 DIAGNOSIS — F17210 Nicotine dependence, cigarettes, uncomplicated: Secondary | ICD-10-CM | POA: Diagnosis not present

## 2024-04-10 DIAGNOSIS — F1910 Other psychoactive substance abuse, uncomplicated: Secondary | ICD-10-CM | POA: Diagnosis not present

## 2024-04-10 DIAGNOSIS — R454 Irritability and anger: Secondary | ICD-10-CM | POA: Diagnosis not present

## 2024-04-10 LAB — HIV-1 RNA QUANT-NO REFLEX-BLD
HIV 1 RNA Quant: <20 {copies}/mL
LOG10 HIV-1 RNA: UNDETERMINED {Log_copies}/mL

## 2024-04-10 MED ORDER — LIDOCAINE 5 % EX PTCH
1.0000 | MEDICATED_PATCH | CUTANEOUS | Status: DC
Start: 2024-04-10 — End: 2024-04-11
  Administered 2024-04-10: 1 via TRANSDERMAL
  Filled 2024-04-10 (×2): qty 1

## 2024-04-10 MED ORDER — ENSURE PLUS HIGH PROTEIN PO LIQD
237.0000 mL | Freq: Two times a day (BID) | ORAL | Status: DC
  Administered 2024-04-11: 237 mL via ORAL

## 2024-04-10 NOTE — ED Notes (Signed)
 Pt is sleeping. No acute distress noted.

## 2024-04-10 NOTE — ED Notes (Signed)
 [name]. She denies intent to harm herself or others when asked. Denies SI or A/VH. Patient c/o shoulder and back pain, and neuropathic pain in her toes. There has been no acute distress observed. Routine safety checks conducted according to facility protocol. Patient agreed to notify staff if thoughts of harm toward self or others arise. Will continue to monitor for safety.

## 2024-04-10 NOTE — ED Provider Notes (Signed)
 Behavioral Health Progress Note  Date and Time: 04/10/2024 4:13 PM Name: Andrea Sanford MRN:  [id number]  Subjective:  Andrea Sanford was seen in the common area on rounds today. She complains of bilateral foot pain, about the same as yesterday. She still wants peroxide foot wash and has a difficult time accepting that this is not available. She is sleeping okay. She feels hungry all the time. She complains about the expiration date on the cookies yesterday, and that she believes that the bread today was also expired. She is not having any GI issues. She says that she complained to RN and she said it was someone elses issue. She also complains that he other patients are not getting nicotine  patches the same as she is. She feels that she is being treated differently than the other patients. She is upset that there was some delay in being given her mother's number. She complains of bruising from blood draws. She engages in triangulation, splitting and blame shifting. She makes ongoing demands for sedating medications. Attempted to educate patient on concerns for declining organ function, falls, over-sedation from the medications and their high doses. She makes condescending remarks and regards that her primary care will give them to her.   Diagnosis:  Final diagnoses:  Polysubstance use disorder    Total Time spent with patient: 30 minutes  Past Psychiatric History: Polysubstance abuse, MDD, Bipolar, PTSD  Past Medical History: HTN, GIRD, DM, Polyneuropathy, Hypokalemia, ... Family History: Patient reports that her mother has similar mental problems and substance abuse Social History: Homeless. Lost custody of her children. Unemployed  Sleep: Good  Appetite:  Good  Current Medications:  Current Facility-Administered Medications  Medication Dose Route Frequency Provider Last Rate Last Admin   acetaminophen  (TYLENOL ) tablet 650 mg  650 mg Oral Q6H PRN Brent, Amanda C, NP   650 mg at 04/09/24  2229   albuterol  (VENTOLIN  HFA) 108 (90 Base) MCG/ACT inhaler 2 puff  2 puff Inhalation Q6H PRN Brent, Amanda C, NP   2 puff at 04/10/24 1543   alum & mag hydroxide-simeth (MAALOX/MYLANTA) 200-200-20 MG/5ML suspension 30 mL  30 mL Oral Q4H PRN Brent, Amanda C, NP       amLODipine  (NORVASC ) tablet 5 mg  5 mg Oral Daily Brent, Amanda C, NP   5 mg at 04/10/24 9077   baclofen  (LIORESAL ) tablet 5 mg  5 mg Oral BID Leigh Corean Massa, MD   5 mg at 04/10/24 9077   haloperidol  (HALDOL ) tablet 5 mg  5 mg Oral TID PRN Brent, Amanda C, NP       And   diphenhydrAMINE  (BENADRYL ) capsule 50 mg  50 mg Oral TID PRN Brent, Amanda C, NP       haloperidol  lactate (HALDOL ) injection 5 mg  5 mg Intramuscular TID PRN Brent, Amanda C, NP       And   diphenhydrAMINE  (BENADRYL ) injection 50 mg  50 mg Intramuscular TID PRN Brent, Amanda C, NP       And   LORazepam  (ATIVAN ) injection 2 mg  2 mg Intramuscular TID PRN Brent, Amanda C, NP       haloperidol  lactate (HALDOL ) injection 10 mg  10 mg Intramuscular TID PRN Brent, Amanda C, NP       And   diphenhydrAMINE  (BENADRYL ) injection 50 mg  50 mg Intramuscular TID PRN Brent, Amanda C, NP       And   LORazepam  (ATIVAN ) injection 2 mg  2 mg Intramuscular TID PRN  Brent, Amanda C, NP       docusate sodium  (COLACE) capsule 100 mg  100 mg Oral Daily Raelin Pixler, Corean Massa, MD   100 mg at 04/10/24 9077   [START ON 04/11/2024] feeding supplement (ENSURE PLUS HIGH PROTEIN) liquid 237 mL  237 mL Oral BID BM Divante Kotch, Corean Massa, MD       gabapentin  (NEURONTIN ) capsule 600 mg  600 mg Oral TID Randall, Veronique M, NP   600 mg at 04/10/24 1540   hydrOXYzine  (ATARAX ) tablet 25 mg  25 mg Oral TID PRN Brent, Amanda C, NP   25 mg at 04/09/24 1141   lactulose  (CHRONULAC ) 10 GM/15ML solution 10 g  10 g Oral BID Leigh Corean Massa, MD   10 g at 04/10/24 9077   lidocaine  (LIDODERM ) 5 % 1 patch  1 patch Transdermal Q24H Leigh Corean Massa, MD   1 patch at 04/10/24 1135   magnesium   hydroxide (MILK OF MAGNESIA) suspension 30 mL  30 mL Oral Daily PRN Brent, Amanda C, NP       nicotine  (NICODERM CQ  - dosed in mg/24 hours) patch 21 mg  21 mg Transdermal Daily Byungura, Veronique M, NP   21 mg at 04/10/24 9075   OLANZapine  (ZYPREXA ) injection 5 mg  5 mg Intramuscular TID PRN Hampton, Tracie B, NP       potassium chloride  SA (KLOR-CON  M) CR tablet 20 mEq  20 mEq Oral Daily Randall, Veronique M, NP   20 mEq at 04/10/24 9075   QUEtiapine  (SEROQUEL ) tablet 400 mg  400 mg Oral QHS Hampton, Tracie B, NP   400 mg at 04/09/24 2115   traZODone  (DESYREL ) tablet 50 mg  50 mg Oral QHS PRN Brent, Amanda C, NP   50 mg at 04/09/24 2115   Current Outpatient Medications  Medication Sig Dispense Refill   albuterol  (VENTOLIN  HFA) 108 (90 Base) MCG/ACT inhaler Inhale 2 puffs into the lungs every 6 (six) hours as needed for wheezing or shortness of breath. 8 g 5   amLODipine  (NORVASC ) 5 MG tablet Take 1 tablet (5 mg total) by mouth daily. 90 tablet 1   Aspirin-Salicylamide-Caffeine  (ARTHRITIS STRENGTH BC POWDER PO) Take 3 packets by mouth every 6 (six) hours as needed (For pain).     atorvastatin  (LIPITOR) 40 MG tablet Take 40 mg by mouth daily.     baclofen  (LIORESAL ) 10 MG tablet Take 1 tablet (10 mg total) by mouth 3 (three) times daily. 90 each 1   gabapentin  (NEURONTIN ) 800 MG tablet Take 800 mg by mouth 3 (three) times daily.     meloxicam  (MOBIC ) 15 MG tablet Take 15 mg by mouth daily.     montelukast  (SINGULAIR ) 10 MG tablet Take 10 mg by mouth daily.     QUEtiapine  (SEROQUEL ) 400 MG tablet Take 1 tablet (400 mg total) by mouth at bedtime. 90 tablet 1   venlafaxine  XR (EFFEXOR -XR) 150 MG 24 hr capsule Take 150 mg by mouth daily.      Labs  Lab Results:  Admission on 04/07/2024  Component Date Value Ref Range Status   Sodium 04/08/2024 139  135 - 145 mmol/L Final   Potassium 04/08/2024 3.6  3.5 - 5.1 mmol/L Final   Chloride 04/08/2024 103  98 - 111 mmol/L Final   CO2 04/08/2024 24   22 - 32 mmol/L Final   Glucose, Bld 04/08/2024 148 (H)  70 - 99 mg/dL Final   Glucose reference range applies only to samples taken after fasting for  at least 8 hours.   BUN 04/08/2024 16  6 - 20 mg/dL Final   Creatinine, Ser 04/08/2024 0.93  0.44 - 1.00 mg/dL Final   Calcium  04/08/2024 8.7 (L)  8.9 - 10.3 mg/dL Final   Total Protein 90/84/7974 6.0 (L)  6.5 - 8.1 g/dL Final   Albumin 90/84/7974 3.5  3.5 - 5.0 g/dL Final   AST 90/84/7974 17  15 - 41 U/L Final   ALT 04/08/2024 15  0 - 44 U/L Final   Alkaline Phosphatase 04/08/2024 104  38 - 126 U/L Final   Total Bilirubin 04/08/2024 0.5  0.0 - 1.2 mg/dL Final   GFR, Estimated 04/08/2024 >60  >60 mL/min Final   Comment: (NOTE) Calculated using the CKD-EPI Creatinine Equation (2021)    Anion gap 04/08/2024 12  5 - 15 Final   Performed at [facility], [address], [place], [place] [address]   Cholesterol 04/08/2024 121  0 - 200 mg/dL Final   Triglycerides 90/84/7974 270 (H)  <150 mg/dL Final   HDL 90/84/7974 39 (L)  >40 mg/dL Final   Total CHOL/HDL Ratio 04/08/2024 3.1  RATIO Final   VLDL 04/08/2024 54 (H)  0 - 40 mg/dL Final   LDL Cholesterol 04/08/2024 28  0 - 99 mg/dL Final   Comment:        Total Cholesterol/HDL:CHD Risk Coronary Heart Disease Risk Table                     Men   Women  1/2 Average Risk   3.4   3.3  Average Risk       5.0   4.4  2 X Average Risk   9.6   7.1  3 X Average Risk  23.4   11.0        Use the calculated Patient Ratio above and the CHD Risk Table to determine the patient's CHD Risk.        ATP III CLASSIFICATION (LDL):  <100     mg/dL   Optimal  899-870  mg/dL   Near or Above                    Optimal  130-159  mg/dL   Borderline  839-810  mg/dL   High  >809     mg/dL   Very High Performed at [facility], [address], [place], [place] [address]    TSH 04/08/2024 1.858  0.350 - 4.500 uIU/mL Final   Comment: Performed by a 3rd Generation assay with a functional sensitivity of  <=0.01 uIU/mL. Performed at [facility], [address] [place], [place] [address]    Ammonia 04/08/2024 65 (H)  9 - 35 umol/L Final   Comment: POST-ULTRACENTRIFUGATION Performed at [facility], [address] [place], [place] [address]    Hepatitis B Surface Ag 04/08/2024 NON REACTIVE  NON REACTIVE Final   HCV Ab 04/08/2024 NON REACTIVE  NON REACTIVE Final   Comment: (NOTE) Nonreactive HCV antibody screen is consistent with no HCV infections,  unless recent infection is suspected or other evidence exists to indicate HCV infection.     Hep A IgM 04/08/2024 NON REACTIVE  NON REACTIVE Final   Hep B C IgM 04/08/2024 EQUIVOCAL (A)  NON REACTIVE Final   Comment: REPEATED TO VERIFY (NOTE) Repeat testing was performed on the submitted specimen with equivocal  or uninterpretable results.  It is recommended that a new  specimen be  obtained and retested. Performed at [facility], [address] [place], [place] [address]    RPR Ser Ql 04/08/2024 NON REACTIVE  NON REACTIVE Final   Performed at [facility], [address], [place], [place] [address]   Vit D, 25-Hydroxy 04/08/2024 39.25  30 - 100 ng/mL Final   Comment: (NOTE) Vitamin D  deficiency has been defined by the Institute of Medicine  and an Endocrine Society practice guideline as a level of serum 25-OH  vitamin D  less than 20 ng/mL (1,2). The Endocrine Society went on to  further define vitamin D  insufficiency as a level between 21 and 29  ng/mL (2).  1. IOM (Institute of Medicine). 2010. Dietary reference intakes for  calcium  and D. [place]: [employer]. 2. Holick MF, Binkley Pendleton, Bischoff-Ferrari HA, et al. Evaluation,  treatment, and prevention of vitamin D  deficiency: an Endocrine  Society clinical practice guideline, JCEM. 2011 Jul; 96(7): 1911-30.  Performed at [facility], [address], [place], [place] [address]    Vitamin B-12 04/08/2024 341  180 - 914  pg/mL Final   Comment: (NOTE) This assay is not validated for testing neonatal or myeloproliferative syndrome specimens for Vitamin B12 levels. Performed at [facility], [address], [place], [place] [address]   Admission on 03/28/2024, Discharged on 03/28/2024  Component Date Value Ref Range Status   Sodium 03/27/2024 141  135 - 145 mmol/L Final   Potassium 03/27/2024 3.2 (L)  3.5 - 5.1 mmol/L Final   Chloride 03/27/2024 103  98 - 111 mmol/L Final   CO2 03/27/2024 28  22 - 32 mmol/L Final   Glucose, Bld 03/27/2024 91  70 - 99 mg/dL Final   Glucose reference range applies only to samples taken after fasting for at least 8 hours.   BUN 03/27/2024 20  6 - 20 mg/dL Final   Creatinine, Ser 03/27/2024 0.86  0.44 - 1.00 mg/dL Final   Calcium  03/27/2024 8.8 (L)  8.9 - 10.3 mg/dL Final   GFR, Estimated 03/27/2024 >60  >60 mL/min Final   Comment: (NOTE) Calculated using the CKD-EPI Creatinine Equation (2021)    Anion gap 03/27/2024 10  5 - 15 Final   Performed at [facility], [address] [place], [place] [address]   WBC 03/27/2024 7.0  4.0 - 10.5 K/uL Final   RBC 03/27/2024 3.83 (L)  3.87 - 5.11 MIL/uL Final   Hemoglobin 03/27/2024 11.4 (L)  12.0 - 15.0 g/dL Final   HCT 90/96/7974 34.7 (L)  36.0 - 46.0 % Final   MCV 03/27/2024 90.6  80.0 - 100.0 fL Final   MCH 03/27/2024 29.8  26.0 - 34.0 pg Final   MCHC 03/27/2024 32.9  30.0 - 36.0 g/dL Final   RDW 90/96/7974 12.7  11.5 - 15.5 % Final   Platelets 03/27/2024 215  150 - 400 K/uL Final   nRBC 03/27/2024 0.0  0.0 - 0.2 % Final   Performed at [facility], [address] [place], [place] [address]   SARS Coronavirus 2 by RT PCR 03/28/2024 NEGATIVE  NEGATIVE Final   Comment: (NOTE) SARS-CoV-2 target nucleic acids are NOT DETECTED.  The SARS-CoV-2 RNA is generally detectable in upper respiratory specimens during the acute phase of infection. The lowest concentration of SARS-CoV-2 viral copies this assay  can detect is 138 copies/mL. A negative result does not preclude SARS-Cov-2 infection and should not be used as the sole  basis for treatment or other patient management decisions. A negative result may occur with  improper specimen collection/handling, submission of specimen other than nasopharyngeal swab, presence of viral mutation(s) within the areas targeted by this assay, and inadequate number of viral copies(<138 copies/mL). A negative result must be combined with clinical observations, patient history, and epidemiological information. The expected result is Negative.  Fact Sheet for Patients:  BloggerCourse.com  Fact Sheet for Healthcare Providers:  SeriousBroker.it  This test is no                          t yet approved or cleared by the [place]  FDA and  has been authorized for detection and/or diagnosis of SARS-CoV-2 by FDA under an Emergency Use Authorization (EUA). This EUA will remain  in effect (meaning this test can be used) for the duration of the COVID-19 declaration under Section 564(b)(1) of the Act, 21 U.S.C.section 360bbb-3(b)(1), unless the authorization is terminated  or revoked sooner.       Influenza A by PCR 03/28/2024 NEGATIVE  NEGATIVE Final   Influenza B by PCR 03/28/2024 NEGATIVE  NEGATIVE Final   Comment: (NOTE) The Xpert Xpress SARS-CoV-2/FLU/RSV plus assay is intended as an aid in the diagnosis of influenza from Nasopharyngeal swab specimens and should not be used as a sole basis for treatment. Nasal washings and aspirates are unacceptable for Xpert Xpress SARS-CoV-2/FLU/RSV testing.  Fact Sheet for Patients: BloggerCourse.com  Fact Sheet for Healthcare Providers: SeriousBroker.it  This test is not yet approved or cleared by the [place]  FDA and has been authorized for detection and/or diagnosis of SARS-CoV-2 by FDA under an  Emergency Use Authorization (EUA). This EUA will remain in effect (meaning this test can be used) for the duration of the COVID-19 declaration under Section 564(b)(1) of the Act, 21 U.S.C. section 360bbb-3(b)(1), unless the authorization is terminated or revoked.     Resp Syncytial Virus by PCR 03/28/2024 NEGATIVE  NEGATIVE Final   Comment: (NOTE) Fact Sheet for Patients: BloggerCourse.com  Fact Sheet for Healthcare Providers: SeriousBroker.it  This test is not yet approved or cleared by the [place]  FDA and has been authorized for detection and/or diagnosis of SARS-CoV-2 by FDA under an Emergency Use Authorization (EUA). This EUA will remain in effect (meaning this test can be used) for the duration of the COVID-19 declaration under Section 564(b)(1) of the Act, 21 U.S.C. section 360bbb-3(b)(1), unless the authorization is terminated or revoked.  Performed at [facility], [address] [place], [place] [address]   Congregational Nurse Program on 02/15/2024  Component Date Value Ref Range Status   POC Glucose 02/15/2024 182 (A)  70 - 99 mg/dl Final   nonfasting  Admission on 02/11/2024, Discharged on 02/14/2024  Component Date Value Ref Range Status   Sodium 02/11/2024 140  135 - 145 mmol/L Final   Potassium 02/11/2024 3.8  3.5 - 5.1 mmol/L Final   Chloride 02/11/2024 104  98 - 111 mmol/L Final   CO2 02/11/2024 24  22 - 32 mmol/L Final   Glucose, Bld 02/11/2024 154 (H)  70 - 99 mg/dL Final   Glucose reference range applies only to samples taken after fasting for at least 8 hours.   BUN 02/11/2024 22 (H)  6 - 20 mg/dL Final   Creatinine, Ser 02/11/2024 0.86  0.44 - 1.00 mg/dL Final   Calcium  02/11/2024 9.9  8.9 - 10.3 mg/dL Final   Total Protein 92/79/7974 7.8  6.5 -  8.1 g/dL Final   Albumin 92/79/7974 4.2  3.5 - 5.0 g/dL Final   AST 92/79/7974 23  15 - 41 U/L Final   ALT 02/11/2024 14  0 - 44 U/L Final    Alkaline Phosphatase 02/11/2024 92  38 - 126 U/L Final   Total Bilirubin 02/11/2024 1.1  0.0 - 1.2 mg/dL Final   GFR, Estimated 02/11/2024 >60  >60 mL/min Final   Comment: (NOTE) Calculated using the CKD-EPI Creatinine Equation (2021)    Anion gap 02/11/2024 12  5 - 15 Final   Performed at [facility], [address] [place], [place] [address]   Troponin I (High Sensitivity) 02/11/2024 4  <18 ng/L Final   Comment: (NOTE) Elevated high sensitivity troponin I (hsTnI) values and significant  changes across serial measurements may suggest ACS but many other  chronic and acute conditions are known to elevate hsTnI results.  Refer to the Links section for chest pain algorithms and additional  guidance. Performed at [facility], [address] [place], [place] [address]    WBC 02/11/2024 11.1 (H)  4.0 - 10.5 K/uL Final   RBC 02/11/2024 5.38 (H)  3.87 - 5.11 MIL/uL Final   Hemoglobin 02/11/2024 16.0 (H)  12.0 - 15.0 g/dL Final   HCT 92/79/7974 48.2 (H)  36.0 - 46.0 % Final   MCV 02/11/2024 89.6  80.0 - 100.0 fL Final   MCH 02/11/2024 29.7  26.0 - 34.0 pg Final   MCHC 02/11/2024 33.2  30.0 - 36.0 g/dL Final   RDW 92/79/7974 13.2  11.5 - 15.5 % Final   Platelets 02/11/2024 224  150 - 400 K/uL Final   nRBC 02/11/2024 0.0  0.0 - 0.2 % Final   Neutrophils Relative % 02/11/2024 73  % Final   Neutro Abs 02/11/2024 8.2 (H)  1.7 - 7.7 K/uL Final   Lymphocytes Relative 02/11/2024 19  % Final   Lymphs Abs 02/11/2024 2.1  0.7 - 4.0 K/uL Final   Monocytes Relative 02/11/2024 6  % Final   Monocytes Absolute 02/11/2024 0.7  0.1 - 1.0 K/uL Final   Eosinophils Relative 02/11/2024 1  % Final   Eosinophils Absolute 02/11/2024 0.1  0.0 - 0.5 K/uL Final   Basophils Relative 02/11/2024 1  % Final   Basophils Absolute 02/11/2024 0.1  0.0 - 0.1 K/uL Final   Immature Granulocytes 02/11/2024 0  % Final   Abs Immature Granulocytes 02/11/2024 0.04  0.00 - 0.07 K/uL Final   Performed at  [facility], [address] [place], [place] [address]   Prothrombin Time 02/11/2024 14.1  11.4 - 15.2 seconds Final   INR 02/11/2024 1.0  0.8 - 1.2 Final   Comment: (NOTE) INR goal varies based on device and disease states. Performed at [facility], [address] [place], [place] [address]    Color, Urine 02/11/2024 STRAW (A)  YELLOW Final   APPearance 02/11/2024 CLEAR (A)  CLEAR Final   Specific Gravity, Urine 02/11/2024 1.009  1.005 - 1.030 Final   pH 02/11/2024 5.0  5.0 - 8.0 Final   Glucose, UA 02/11/2024 NEGATIVE  NEGATIVE mg/dL Final   Hgb urine dipstick 02/11/2024 NEGATIVE  NEGATIVE Final   Bilirubin Urine 02/11/2024 NEGATIVE  NEGATIVE Final   Ketones, ur 02/11/2024 5 (A)  NEGATIVE mg/dL Final   Protein, ur 92/79/7974 NEGATIVE  NEGATIVE mg/dL Final   Nitrite 92/79/7974 NEGATIVE  NEGATIVE Final   Leukocytes,Ua 02/11/2024 NEGATIVE  NEGATIVE Final   Performed at [facility], [address] [place], [place] [address]   Tricyclic, Ur Screen 02/11/2024 NONE DETECTED  NONE DETECTED Final   Amphetamines, Ur Screen 02/11/2024 NONE DETECTED  NONE DETECTED Final   MDMA (Ecstasy)Ur Screen 02/11/2024 NONE DETECTED  NONE DETECTED Final   Cocaine Metabolite,Ur [place] 02/11/2024 POSITIVE (A)  NONE DETECTED Final   Opiate, Ur Screen 02/11/2024 NONE DETECTED  NONE DETECTED Final   Phencyclidine (PCP) Ur S 02/11/2024 NONE DETECTED  NONE DETECTED Final   Cannabinoid 50 Ng, Ur [place] 02/11/2024 POSITIVE (A)  NONE DETECTED Final   Barbiturates, Ur Screen 02/11/2024 NONE DETECTED  NONE DETECTED Final   Benzodiazepine, Ur Scrn 02/11/2024 NONE DETECTED  NONE DETECTED Final   Methadone Scn, Ur 02/11/2024 NONE DETECTED  NONE DETECTED Final   Comment: (NOTE) Tricyclics + metabolites, urine    Cutoff 1000 ng/mL Amphetamines + metabolites, urine  Cutoff 1000 ng/mL MDMA (Ecstasy), urine              Cutoff 500 ng/mL Cocaine Metabolite, urine          Cutoff 300 ng/mL Opiate +  metabolites, urine        Cutoff 300 ng/mL Phencyclidine (PCP), urine         Cutoff 25 ng/mL Cannabinoid, urine                 Cutoff 50 ng/mL Barbiturates + metabolites, urine  Cutoff 200 ng/mL Benzodiazepine, urine              Cutoff 200 ng/mL Methadone, urine                   Cutoff 300 ng/mL  The urine drug screen provides only a preliminary, unconfirmed analytical test result and should not be used for non-medical purposes. Clinical consideration and professional judgment should be applied to any positive drug screen result due to possible interfering substances. A more specific alternate chemical method must be used in order to obtain a confirmed analytical result. Gas chromatography / mass spectrometry (GC/MS) is the preferred confirm                          atory method. Performed at [facility], [address] [place], [place] [address]    FIO2 02/11/2024 0.24  % Final   Delivery systems 02/11/2024 VENTILATOR   Final   Mode 02/11/2024 PRESSURE REGULATED VOLUME CONTROL   Final   MECHVT 02/11/2024 500  mL Final   RATE 02/11/2024 18  resp/min Final   PEEP 02/11/2024 5.0  cm H20 Final   pH, Arterial 02/11/2024 7.39  7.35 - 7.45 Final   pCO2 arterial 02/11/2024 40  32 - 48 mmHg Final   pO2, Arterial 02/11/2024 86  83 - 108 mmHg Final   Bicarbonate 02/11/2024 24.2  20.0 - 28.0 mmol/L Final   Acid-base deficit 02/11/2024 0.7  0.0 - 2.0 mmol/L Final   O2 Saturation 02/11/2024 97.7  % Final   Patient temperature 02/11/2024 37.0   Final   Collection site 02/11/2024 LEFT BRACHIAL   Final   Allens test (pass/fail) 02/11/2024 PASS  PASS Final   Performed at [facility], [address] [place], [place] [address]   Acetaminophen  (Tylenol ), Serum 02/11/2024 <10 (L)  10 - 30 ug/mL Final   Comment: (NOTE) Therapeutic concentrations vary significantly. A range of 10-30 ug/mL  may be an effective concentration for many patients. However, some  are best treated  at concentrations outside of this range. Acetaminophen  concentrations >150 ug/mL at 4 hours after ingestion  and >50 ug/mL at 12 hours after ingestion are often associated with  toxic reactions.  Performed at [facility], [address] [place], [place] [address]    Salicylate Lvl 02/11/2024 <7.0 (L)  7.0 - 30.0 mg/dL Final   Performed at [facility], [address] [place], [place] [address]   Alcohol, Ethyl (B) 02/11/2024 <15  <15 mg/dL Final   Comment: (NOTE) For medical purposes only. Performed at [facility], [address] [place], [place] [address]    ABO/RH(D) 02/11/2024 O POS   Final   Antibody Screen 02/11/2024 NEG   Final   Sample Expiration 02/11/2024    Final                   Value:02/14/2024,2359 Performed at [facility], [address] [place], [place] [address]    Salicylate Lvl 02/11/2024 <7.0 (L)  7.0 - 30.0 mg/dL Final   Performed at [facility], [address] [place], [place] [address]   Troponin I (High Sensitivity) 02/11/2024 5  <18 ng/L Final   Comment: (NOTE) Elevated high sensitivity troponin I (hsTnI) values and significant  changes across serial measurements may suggest ACS but many other  chronic and acute conditions are known to elevate hsTnI results.  Refer to the Links section for chest pain algorithms and additional  guidance. Performed at [facility], [address] [place], [place] [address]    MRSA by PCR Next Gen 02/11/2024 NOT DETECTED  NOT DETECTED Final   Comment: (NOTE) The GeneXpert MRSA Assay (FDA approved for NASAL specimens only), is one component of a comprehensive MRSA colonization surveillance program. It is not intended to diagnose MRSA infection nor to guide or monitor treatment for MRSA infections. Test performance is not FDA approved in patients less than 53 years old. Performed at [facility], [address] [place], [place] [address]     Specimen Description 02/11/2024    Final                   Value:TRACHEAL ASPIRATE Performed at [facility], [address] [place], [place] [address]    Special Requests 02/11/2024    Final                   Value:NONE Performed at [facility], [address] [place], [place] [address]    Gram Stain 02/11/2024    Final                   Value:RARE WBC PRESENT, PREDOMINANTLY PMN MODERATE GRAM NEGATIVE RODS RARE GRAM POSITIVE COCCI Performed at [facility], [address], [place], [place] [address]    Culture 02/11/2024    Final                   Value:ABUNDANT HAEMOPHILUS INFLUENZAE BETA LACTAMASE NEGATIVE FEW STAPHYLOCOCCUS AUREUS    Report Status 02/11/2024 02/16/2024 FINAL   Final   Organism ID, Bacteria 02/11/2024 STAPHYLOCOCCUS AUREUS   Final   SARS Coronavirus 2 by RT PCR 02/11/2024 NEGATIVE  NEGATIVE Final   Comment: (NOTE) SARS-CoV-2 target nucleic acids are NOT DETECTED.  The SARS-CoV-2 RNA is generally detectable in upper respiratory specimens during the acute phase of infection. The lowest concentration of SARS-CoV-2 viral copies this assay can detect is 138 copies/mL. A negative result does not  preclude SARS-Cov-2 infection and should not be used as the sole basis for treatment or other patient management decisions. A negative result may occur with  improper specimen collection/handling, submission of specimen other than nasopharyngeal swab, presence of viral mutation(s) within the areas targeted by this assay, and inadequate number of viral copies(<138 copies/mL). A negative result must be combined with clinical observations, patient history, and epidemiological information. The expected result is Negative.  Fact Sheet for Patients:  BloggerCourse.com  Fact Sheet for Healthcare Providers:  SeriousBroker.it  This test is no                          t yet approved or cleared by  the [place]  FDA and  has been authorized for detection and/or diagnosis of SARS-CoV-2 by FDA under an Emergency Use Authorization (EUA). This EUA will remain  in effect (meaning this test can be used) for the duration of the COVID-19 declaration under Section 564(b)(1) of the Act, 21 U.S.C.section 360bbb-3(b)(1), unless the authorization is terminated  or revoked sooner.       Influenza A by PCR 02/11/2024 NEGATIVE  NEGATIVE Final   Influenza B by PCR 02/11/2024 NEGATIVE  NEGATIVE Final   Comment: (NOTE) The Xpert Xpress SARS-CoV-2/FLU/RSV plus assay is intended as an aid in the diagnosis of influenza from Nasopharyngeal swab specimens and should not be used as a sole basis for treatment. Nasal washings and aspirates are unacceptable for Xpert Xpress SARS-CoV-2/FLU/RSV testing.  Fact Sheet for Patients: BloggerCourse.com  Fact Sheet for Healthcare Providers: SeriousBroker.it  This test is not yet approved or cleared by the [place]  FDA and has been authorized for detection and/or diagnosis of SARS-CoV-2 by FDA under an Emergency Use Authorization (EUA). This EUA will remain in effect (meaning this test can be used) for the duration of the COVID-19 declaration under Section 564(b)(1) of the Act, 21 U.S.C. section 360bbb-3(b)(1), unless the authorization is terminated or revoked.     Resp Syncytial Virus by PCR 02/11/2024 NEGATIVE  NEGATIVE Final   Comment: (NOTE) Fact Sheet for Patients: BloggerCourse.com  Fact Sheet for Healthcare Providers: SeriousBroker.it  This test is not yet approved or cleared by the [place]  FDA and has been authorized for detection and/or diagnosis of SARS-CoV-2 by FDA under an Emergency Use Authorization (EUA). This EUA will remain in effect (meaning this test can be used) for the duration of the COVID-19 declaration under Section  564(b)(1) of the Act, 21 U.S.C. section 360bbb-3(b)(1), unless the authorization is terminated or revoked.  Performed at [facility], [address] [place], [place] [address]    Adenovirus 02/11/2024 NOT DETECTED  NOT DETECTED Final   Coronavirus 229E 02/11/2024 NOT DETECTED  NOT DETECTED Final   Comment: (NOTE) The Coronavirus on the Respiratory Panel, DOES NOT test for the novel  Coronavirus (2019 nCoV)    Coronavirus HKU1 02/11/2024 NOT DETECTED  NOT DETECTED Final   Coronavirus NL63 02/11/2024 NOT DETECTED  NOT DETECTED Final   Coronavirus OC43 02/11/2024 NOT DETECTED  NOT DETECTED Final   Metapneumovirus 02/11/2024 NOT DETECTED  NOT DETECTED Final   Rhinovirus / Enterovirus 02/11/2024 NOT DETECTED  NOT DETECTED Final   Influenza A 02/11/2024 NOT DETECTED  NOT DETECTED Final   Influenza B 02/11/2024 NOT DETECTED  NOT DETECTED Final   Parainfluenza Virus 1 02/11/2024 NOT DETECTED  NOT DETECTED Final   Parainfluenza Virus 2 02/11/2024 NOT DETECTED  NOT DETECTED Final   Parainfluenza Virus 3  02/11/2024 NOT DETECTED  NOT DETECTED Final   Parainfluenza Virus 4 02/11/2024 NOT DETECTED  NOT DETECTED Final   Respiratory Syncytial Virus 02/11/2024 NOT DETECTED  NOT DETECTED Final   Bordetella pertussis 02/11/2024 NOT DETECTED  NOT DETECTED Final   Bordetella Parapertussis 02/11/2024 NOT DETECTED  NOT DETECTED Final   Chlamydophila pneumoniae 02/11/2024 NOT DETECTED  NOT DETECTED Final   Mycoplasma pneumoniae 02/11/2024 NOT DETECTED  NOT DETECTED Final   Performed at [facility], [address] [place], [place] [address]   Glucose-Capillary 02/11/2024 161 (H)  70 - 99 mg/dL Final   Glucose reference range applies only to samples taken after fasting for at least 8 hours.   WBC 02/12/2024 10.2  4.0 - 10.5 K/uL Final   RBC 02/12/2024 4.69  3.87 - 5.11 MIL/uL Final   Hemoglobin 02/12/2024 14.0  12.0 - 15.0 g/dL Final   HCT 92/78/7974 42.2  36.0 - 46.0 % Final   MCV  02/12/2024 90.0  80.0 - 100.0 fL Final   MCH 02/12/2024 29.9  26.0 - 34.0 pg Final   MCHC 02/12/2024 33.2  30.0 - 36.0 g/dL Final   RDW 92/78/7974 13.6  11.5 - 15.5 % Final   Platelets 02/12/2024 194  150 - 400 K/uL Final   nRBC 02/12/2024 0.0  0.0 - 0.2 % Final   Performed at [facility], [address] [place], [place] [address]   Sodium 02/12/2024 144  135 - 145 mmol/L Final   Potassium 02/12/2024 3.1 (L)  3.5 - 5.1 mmol/L Final   Chloride 02/12/2024 110  98 - 111 mmol/L Final   CO2 02/12/2024 23  22 - 32 mmol/L Final   Glucose, Bld 02/12/2024 113 (H)  70 - 99 mg/dL Final   Glucose reference range applies only to samples taken after fasting for at least 8 hours.   BUN 02/12/2024 22 (H)  6 - 20 mg/dL Final   Creatinine, Ser 02/12/2024 0.76  0.44 - 1.00 mg/dL Final   Calcium  02/12/2024 9.2  8.9 - 10.3 mg/dL Final   GFR, Estimated 02/12/2024 >60  >60 mL/min Final   Comment: (NOTE) Calculated using the CKD-EPI Creatinine Equation (2021)    Anion gap 02/12/2024 14  5 - 15 Final   Performed at [facility], [address] [place], [place] [address]   FIO2 02/12/2024 24  % Final   MECHVT 02/12/2024 500  mL Final   PEEP 02/12/2024 5  cm H20 Final   pH, Arterial 02/12/2024 7.48 (H)  7.35 - 7.45 Final   pCO2 arterial 02/12/2024 34  32 - 48 mmHg Final   pO2, Arterial 02/12/2024 75 (L)  83 - 108 mmHg Final   Bicarbonate 02/12/2024 25.3  20.0 - 28.0 mmol/L Final   Acid-Base Excess 02/12/2024 2.2 (H)  0.0 - 2.0 mmol/L Final   O2 Saturation 02/12/2024 95.5  % Final   Patient temperature 02/12/2024 37.0   Final   Collection site 02/12/2024 RIGHT RADIAL   Final   Allens test (pass/fail) 02/12/2024 PASS  PASS Final   Mechanical Rate 02/12/2024 18   Final   Performed at [facility], [address] [place], [place] [address]   Magnesium  02/12/2024 2.0  1.7 - 2.4 mg/dL Final   Performed at [facility], [address] [place], [place] [address]    Phosphorus 02/12/2024 3.4  2.5 - 4.6 mg/dL Final   Performed at [facility], [address] [place], [place] [address]  Triglycerides 02/12/2024 154 (H)  <150 mg/dL Final   Performed at [facility], [address] [place], [place] [address]   Glucose-Capillary 02/11/2024 144 (H)  70 - 99 mg/dL Final   Glucose reference range applies only to samples taken after fasting for at least 8 hours.   Glucose-Capillary 02/11/2024 128 (H)  70 - 99 mg/dL Final   Glucose reference range applies only to samples taken after fasting for at least 8 hours.   Glucose-Capillary 02/12/2024 116 (H)  70 - 99 mg/dL Final   Glucose reference range applies only to samples taken after fasting for at least 8 hours.   Glucose-Capillary 02/12/2024 125 (H)  70 - 99 mg/dL Final   Glucose reference range applies only to samples taken after fasting for at least 8 hours.   Glucose-Capillary 02/12/2024 108 (H)  70 - 99 mg/dL Final   Glucose reference range applies only to samples taken after fasting for at least 8 hours.   Glucose-Capillary 02/12/2024 143 (H)  70 - 99 mg/dL Final   Glucose reference range applies only to samples taken after fasting for at least 8 hours.   Triglycerides 02/13/2024 103  <150 mg/dL Final   Performed at [facility], [address] [place], [place] [address]   Magnesium  02/13/2024 2.2  1.7 - 2.4 mg/dL Final   Performed at [facility], [address] [place], [place] [address]   Phosphorus 02/13/2024 3.0  2.5 - 4.6 mg/dL Final   Performed at [facility], [address] [place], [place] [address]   Sodium 02/13/2024 145  135 - 145 mmol/L Final   Potassium 02/13/2024 3.3 (L)  3.5 - 5.1 mmol/L Final   Chloride 02/13/2024 110  98 - 111 mmol/L Final   CO2 02/13/2024 25  22 - 32 mmol/L Final   Glucose, Bld 02/13/2024 139 (H)  70 - 99 mg/dL Final   Glucose reference range applies only to samples taken after fasting for at least 8 hours.   BUN  02/13/2024 32 (H)  6 - 20 mg/dL Final   Creatinine, Ser 02/13/2024 0.93  0.44 - 1.00 mg/dL Final   Calcium  02/13/2024 9.0  8.9 - 10.3 mg/dL Final   GFR, Estimated 02/13/2024 >60  >60 mL/min Final   Comment: (NOTE) Calculated using the CKD-EPI Creatinine Equation (2021)    Anion gap 02/13/2024 10  5 - 15 Final   Performed at [facility], [address] [place], [place] [address]   Glucose-Capillary 02/12/2024 126 (H)  70 - 99 mg/dL Final   Glucose reference range applies only to samples taken after fasting for at least 8 hours.   Glucose-Capillary 02/12/2024 133 (H)  70 - 99 mg/dL Final   Glucose reference range applies only to samples taken after fasting for at least 8 hours.   Glucose-Capillary 02/13/2024 149 (H)  70 - 99 mg/dL Final   Glucose reference range applies only to samples taken after fasting for at least 8 hours.   Glucose-Capillary 02/13/2024 154 (H)  70 - 99 mg/dL Final   Glucose reference range applies only to samples taken after fasting for at least 8 hours.   Glucose-Capillary 02/13/2024 167 (H)  70 - 99 mg/dL Final   Glucose reference range applies only to samples taken after fasting for at least 8 hours.   Glucose-Capillary 02/13/2024 87  70 - 99 mg/dL Final   Glucose reference range applies only to samples taken after fasting for at least 8 hours.  Magnesium  02/14/2024 2.0  1.7 - 2.4 mg/dL Final   Performed at [facility], [address] [place], [place] [address]   Phosphorus 02/14/2024 2.6  2.5 - 4.6 mg/dL Final   Performed at [facility], [address] [place], [place] [address]   Sodium 02/14/2024 143  135 - 145 mmol/L Final   Potassium 02/14/2024 3.5  3.5 - 5.1 mmol/L Final   Chloride 02/14/2024 111  98 - 111 mmol/L Final   CO2 02/14/2024 24  22 - 32 mmol/L Final   Glucose, Bld 02/14/2024 116 (H)  70 - 99 mg/dL Final   Glucose reference range applies only to samples taken after fasting for at least 8 hours.   BUN 02/14/2024  23 (H)  6 - 20 mg/dL Final   Creatinine, Ser 02/14/2024 0.72  0.44 - 1.00 mg/dL Final   Calcium  02/14/2024 8.3 (L)  8.9 - 10.3 mg/dL Final   GFR, Estimated 02/14/2024 >60  >60 mL/min Final   Comment: (NOTE) Calculated using the CKD-EPI Creatinine Equation (2021)    Anion gap 02/14/2024 8  5 - 15 Final   Performed at [facility], [address] [place], [place] [address]   Glucose-Capillary 02/13/2024 93  70 - 99 mg/dL Final   Glucose reference range applies only to samples taken after fasting for at least 8 hours.   Glucose-Capillary 02/13/2024 108 (H)  70 - 99 mg/dL Final   Glucose reference range applies only to samples taken after fasting for at least 8 hours.   Glucose-Capillary 02/14/2024 113 (H)  70 - 99 mg/dL Final   Glucose reference range applies only to samples taken after fasting for at least 8 hours.   Glucose-Capillary 02/14/2024 86  70 - 99 mg/dL Final   Glucose reference range applies only to samples taken after fasting for at least 8 hours.  Refill on 12/08/2023  Component Date Value Ref Range Status   Rapid Strep A Screen 12/08/2023 Negative  Negative Final  Admission on 11/21/2023, Discharged on 11/21/2023  Component Date Value Ref Range Status   WBC 11/21/2023 8.2  4.0 - 10.5 K/uL Final   RBC 11/21/2023 4.03  3.87 - 5.11 MIL/uL Final   Hemoglobin 11/21/2023 11.9 (L)  12.0 - 15.0 g/dL Final   HCT 95/70/7974 36.6  36.0 - 46.0 % Final   MCV 11/21/2023 90.8  80.0 - 100.0 fL Final   MCH 11/21/2023 29.5  26.0 - 34.0 pg Final   MCHC 11/21/2023 32.5  30.0 - 36.0 g/dL Final   RDW 95/70/7974 13.0  11.5 - 15.5 % Final   Platelets 11/21/2023 223  150 - 400 K/uL Final   nRBC 11/21/2023 0.0  0.0 - 0.2 % Final   Performed at [facility], [address] [place], [place] [address]   Sodium 11/21/2023 142  135 - 145 mmol/L Final   Potassium 11/21/2023 3.1 (L)  3.5 - 5.1 mmol/L Final   Chloride 11/21/2023 112 (H)  98 - 111 mmol/L Final   CO2 11/21/2023 24   22 - 32 mmol/L Final   Glucose, Bld 11/21/2023 144 (H)  70 - 99 mg/dL Final   Glucose reference range applies only to samples taken after fasting for at least 8 hours.   BUN 11/21/2023 12  6 - 20 mg/dL Final   Creatinine, Ser 11/21/2023 0.82  0.44 - 1.00 mg/dL Final   Calcium  11/21/2023 8.4 (L)  8.9 - 10.3 mg/dL Final   Total Protein 95/70/7974 6.4 (L)  6.5 - 8.1 g/dL Final   Albumin 95/70/7974 3.5  3.5 - 5.0 g/dL Final   AST 95/70/7974 16  15 - 41 U/L Final   ALT 11/21/2023 11  0 - 44 U/L Final   Alkaline Phosphatase 11/21/2023 84  38 - 126 U/L Final   Total Bilirubin 11/21/2023 <0.2  0.0 - 1.2 mg/dL Final   GFR, Estimated 11/21/2023 >60  >60 mL/min Final   Comment: (NOTE) Calculated using the CKD-EPI Creatinine Equation (2021)    Anion gap 11/21/2023 6  5 - 15 Final   Performed at [facility], [address] [place], [place] [address]   Alcohol, Ethyl (B) 11/21/2023 69 (H)  <15 mg/dL Final   Comment: Please note change in reference range. (NOTE) For medical purposes only. Performed at [facility], [address] [place], [place] [address]   Office Visit on 11/03/2023  Component Date Value Ref Range Status   COLOGUARD 11/18/2023 Sample Could Not Be Processed 9  N/A Final   The Cologuard (TM) test was assigned to this specimen.  An empty collection kit was received in the laboratory. The patient will be contacted to initiate a new sample collection.  Admission on 10/15/2023, Discharged on 10/15/2023  Component Date Value Ref Range Status   SARS Coronavirus 2 by RT PCR 10/15/2023 NEGATIVE  NEGATIVE Final   Comment: (NOTE) SARS-CoV-2 target nucleic acids are NOT DETECTED.  The SARS-CoV-2 RNA is generally detectable in upper respiratory specimens during the acute phase of infection. The lowest concentration of SARS-CoV-2 viral copies this assay can detect is 138 copies/mL. A negative result does not preclude SARS-Cov-2 infection and should not be used as  the sole basis for treatment or other patient management decisions. A negative result may occur with  improper specimen collection/handling, submission of specimen other than nasopharyngeal swab, presence of viral mutation(s) within the areas targeted by this assay, and inadequate number of viral copies(<138 copies/mL). A negative result must be combined with clinical observations, patient history, and epidemiological information. The expected result is Negative.  Fact Sheet for Patients:  BloggerCourse.com  Fact Sheet for Healthcare Providers:  SeriousBroker.it  This test is no                          t yet approved or cleared by the [place]  FDA and  has been authorized for detection and/or diagnosis of SARS-CoV-2 by FDA under an Emergency Use Authorization (EUA). This EUA will remain  in effect (meaning this test can be used) for the duration of the COVID-19 declaration under Section 564(b)(1) of the Act, 21 U.S.C.section 360bbb-3(b)(1), unless the authorization is terminated  or revoked sooner.       Influenza A by PCR 10/15/2023 NEGATIVE  NEGATIVE Final   Influenza B by PCR 10/15/2023 NEGATIVE  NEGATIVE Final   Comment: (NOTE) The Xpert Xpress SARS-CoV-2/FLU/RSV plus assay is intended as an aid in the diagnosis of influenza from Nasopharyngeal swab specimens and should not be used as a sole basis for treatment. Nasal washings and aspirates are unacceptable for Xpert Xpress SARS-CoV-2/FLU/RSV testing.  Fact Sheet for Patients: BloggerCourse.com  Fact Sheet for Healthcare Providers: SeriousBroker.it  This test is not yet approved or cleared by the [place]  FDA and has been authorized for detection and/or diagnosis of SARS-CoV-2 by FDA under an Emergency Use Authorization (EUA). This EUA will remain in effect (meaning this test can be used) for the duration of  the  COVID-19 declaration under Section 564(b)(1) of the Act, 21 U.S.C. section 360bbb-3(b)(1), unless the authorization is terminated or revoked.     Resp Syncytial Virus by PCR 10/15/2023 NEGATIVE  NEGATIVE Final   Comment: (NOTE) Fact Sheet for Patients: BloggerCourse.com  Fact Sheet for Healthcare Providers: SeriousBroker.it  This test is not yet approved or cleared by the [place]  FDA and has been authorized for detection and/or diagnosis of SARS-CoV-2 by FDA under an Emergency Use Authorization (EUA). This EUA will remain in effect (meaning this test can be used) for the duration of the COVID-19 declaration under Section 564(b)(1) of the Act, 21 U.S.C. section 360bbb-3(b)(1), unless the authorization is terminated or revoked.  Performed at [facility], [address] [place], [place] [address]     Blood Alcohol level:  Lab Results  Component Value Date   [facility] <15 04/06/2024   [facility] <15 02/11/2024    Metabolic Disorder Labs: Lab Results  Component Value Date   HGBA1C 6.6 (A) 10/03/2023   MPG 148.46 09/14/2023   No results found for: PROLACTIN Lab Results  Component Value Date   CHOL 121 04/08/2024   TRIG 270 (H) 04/08/2024   HDL 39 (L) 04/08/2024   CHOLHDL 3.1 04/08/2024   VLDL 54 (H) 04/08/2024   LDLCALC 28 04/08/2024   LDLCALC 137 (H) 10/03/2023    Therapeutic Lab Levels: No results found for: LITHIUM No results found for: VALPROATE No results found for: CBMZ  Physical Findings   AIMS    Flowsheet Row ED from 04/07/2024 in [facility]  AIMS Total Score 4   GAD-7    Flowsheet Row Office Visit from 09/29/2023 in [facility] Office Visit from 06/13/2023 in [facility] Counselor from 08/29/2017 in [facility]  Total GAD-7 Score 10 16 19    PHQ2-9    Flowsheet Row ED from 04/07/2024  in [facility] Patient Outreach Telephone from 04/02/2024 in [place] [facility] DEPARTMENT Office Visit from 09/29/2023 in [facility] Office Visit from 09/22/2023 in [facility] Office Visit from 06/13/2023 in [facility]  PHQ-2 Total Score 6 3 3 4 3   PHQ-9 Total Score 24 11 16 18 11    Flowsheet Row ED from 04/07/2024 in [facility] ED from 03/28/2024 in [facility] Emergency Department at [facility] ED from 11/21/2023 in [facility] Emergency Department at [facility]  C-SSRS RISK CATEGORY Low Risk No Risk No Risk     Musculoskeletal  Strength & Muscle Tone: within normal limits Gait & Station: normal Patient leans: N/A  Psychiatric Specialty Exam  Presentation  General Appearance:  Disheveled  Eye Contact: Good  Speech: -- (normal rate, increased total quantity. difficult to interrupt.)  Speech Volume: Normal  Handedness: Right   Mood and Affect  Mood: Irritable  Affect: Congruent   Thought Process  Thought Processes: Irrevelant  Descriptions of Associations:Tangential  Orientation:Full (Time, Place and Person)  Thought Content:Illogical; Rumination  Diagnosis of Schizophrenia or Schizoaffective disorder in past: No    Hallucinations:Hallucinations: None  Ideas of Reference:None  Suicidal Thoughts:Suicidal Thoughts: No  Homicidal Thoughts:Homicidal Thoughts: No   Sensorium  Memory: Recent Poor; Immediate Poor  Judgment: Poor  Insight: Poor   Executive Functions  Concentration: Poor  Attention Span: Poor  Recall: Poor  Fund of Knowledge: Poor  Language: Fair   Psychomotor Activity  Psychomotor Activity:  Psychomotor Activity: Extrapyramidal Side Effects (EPS) Extrapyramidal Side Effects (EPS): Tardive Dyskinesia   Assets  Assets: Leisure Time   Sleep  Sleep: Sleep:  Good  Estimated Sleeping Duration (Last 24 Hours): 6.25-7.75 hours  No data recorded  Physical Exam  Physical Exam ROS Blood pressure (!) 148/98, pulse 92, temperature 98.3 F (36.8 C), temperature source Oral, resp. rate 19, SpO2 98%. There is no height or weight on file to calculate BMI.  Treatment Plan Summary: Long Term Goals: Improvement in symptoms so as ready for discharge   Short Term Goals: Patient will verbalize feelings in meetings with treatment team members., Patient will attend at least of 50% of the groups daily., Pt will complete the PHQ9 on admission, day 3 and discharge., and Patient will take medications as prescribed daily.   Medications: Mood/anxiety: continue group therapy, milieu therapy, 1:1 evaluation with provider.  Medication management: seroquel  400mg  PO at bedtime, gabapentin  600mg  PO TID  Substance Abuse:  brief intervention provided abstinence advised.  stimulant use: encourage patient to maintain PO intake. Coverage PRN for secondary psychosis.  Nicotine  and tobacco use: Nicotine  replacement therapy provided.  counseling on cessation of nicotine  use provided Medical: PRNs for pain, constipation, indigestion available.  Labs/studies: ammonia level 65. Starting lactulose  10mg  PO BID Tapering baclofen  with plan to discontinue due to slurring, appearing intoxicated.  Safety and Monitoring: voluntarily admission to [facility]/Facility based care unit [facility]) unit for safety, stabilization and treatment Daily contact with patient to assess and evaluate symptoms and progress in treatment Patient's case to be discussed in multi-disciplinary team meeting Observation Level : q15 minute checks Vital signs: q12 hours Precautions: withdrawal and fall   Based on my evaluation the patient does not appear to have an emergency medical condition.  Corean Anette Potters, MD 04/10/2024 4:13 PM

## 2024-04-10 NOTE — ED Notes (Signed)
 Upon Clinical research associate going round for Q15 safety checks, pt door was completely closed, Clinical research associate informed pt that her door can not be completely closed and also we will be checking up on her every 15 min as we always do. Patient gave Clinical research associate the middle finger sign and said  fuck you.

## 2024-04-10 NOTE — ED Notes (Signed)
 Patient got upset when another patient was given salad dressing and she already had salad dressing on her food wanted more, the MHT advised her to get more dressing for herself, she did not what that she wanted was  something else but not sure what it was? Then she wanted more juice which she was given, but she still complained and was not happy. Informed the nurse. Earlier she did not same thing again with the snacks. She was not happy with her snacks was only concerned with someone else's.
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
## 2024-04-11 ENCOUNTER — Telehealth: Payer: Self-pay | Admitting: *Deleted

## 2024-04-11 ENCOUNTER — Telehealth: Payer: Self-pay

## 2024-04-11 DIAGNOSIS — R454 Irritability and anger: Secondary | ICD-10-CM | POA: Diagnosis not present

## 2024-04-11 DIAGNOSIS — F1910 Other psychoactive substance abuse, uncomplicated: Secondary | ICD-10-CM | POA: Diagnosis not present

## 2024-04-11 DIAGNOSIS — F17210 Nicotine dependence, cigarettes, uncomplicated: Secondary | ICD-10-CM | POA: Diagnosis not present

## 2024-04-11 DIAGNOSIS — I10 Essential (primary) hypertension: Secondary | ICD-10-CM | POA: Diagnosis not present

## 2024-04-11 MED ORDER — BACLOFEN 5 MG PO TABS: 5.0000 mg | ORAL_TABLET | Freq: Two times a day (BID) | ORAL | 0 refills | Status: DC

## 2024-04-11 MED ORDER — QUETIAPINE FUMARATE 400 MG PO TABS
400.0000 mg | ORAL_TABLET | Freq: Every day | ORAL | 0 refills | Status: DC
Start: 2024-04-11 — End: 2024-05-13

## 2024-04-11 MED ORDER — GABAPENTIN 300 MG PO CAPS: 600.0000 mg | ORAL_CAPSULE | Freq: Three times a day (TID) | ORAL | 0 refills | Status: DC

## 2024-04-11 MED ORDER — LACTULOSE 10 GM/15ML PO SOLN: 10.0000 g | Freq: Two times a day (BID) | ORAL | 0 refills | Status: DC

## 2024-04-11 MED ORDER — LIDOCAINE 5 % EX PTCH: 1.0000 | MEDICATED_PATCH | CUTANEOUS | 0 refills | Status: DC

## 2024-04-11 MED ORDER — NICOTINE 21 MG/24HR TD PT24: 21.0000 mg | MEDICATED_PATCH | Freq: Every day | TRANSDERMAL | 0 refills | Status: DC

## 2024-04-11 NOTE — Care Management (Addendum)
 [facility] Care management   [facility] - Declined due to having a standard Medicaid plan.  The patient will need to have her Medicaid switched to a tailored Medicaid plan.   [facility] - Declined because they do not have any treatment beds for females right now, per intake worker.   [facility] - Declined due to medical concerns, per intake worker.   11:30am  Patient is requesting discharge, per RN.  Writer will place substance abuse resources and shelter resources in the discharge AVS.

## 2024-04-11 NOTE — ED Notes (Signed)
 Pt is sleeping, no acute distress noted. Q15 safety checks in place.
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
## 2024-04-11 NOTE — Patient Outreach (Signed)
 Phone call from patient requesting help with getting her medications and referral to drug treatment. Patient is currently at the [facility] Per chart she is working with the discharge planner on available options for substance misuse treatment. This Child psychotherapist encouraged patient to continue to coordinate her care with the treatment team through [facility].  Patient provided with verbal consent to speak to her  nurse before abruptly ending the call. RN contacted [phone] discussed possible option of referral to [facility] in addition to plan for rehab programs if available.   Andrea Monds, LCSW [place]  [facility], [facility] Licensed Clinical Social Worker  Direct Dial: [phone]
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
## 2024-04-11 NOTE — ED Notes (Signed)
 Patient became loud and verbally abusive towards Clinical research associate.  Apparently patients mother called earlier and staff forgot to tell her.  When writer found out patient was informed.  When patient found out that her mother had called an hour before and she was not informed she became loud and verbally abusive.  Patient was spoken to by Production designer, theatre/television/film and house supervisor.  She then called down.  Shortly afterward patient demanded discharge.  Patient was declined from all rehab facilities.  Patient will be given AVS, prescriptions and bus pass.

## 2024-04-11 NOTE — Telephone Encounter (Signed)
 Copied from CRM [id number]. Topic: General - Other >> Apr 11, 2024  9:41 AM [name] wrote: Pt is trying to get in contact with Chrystal M Land for additional information about the program she was referred to. Pt was referred to Population health by Dr. Zingler. >> Apr 11, 2024 11:33 AM CMA Warren DEL wrote: Not enough information sending this to provider

## 2024-04-11 NOTE — Discharge Instructions (Signed)
 .. Substance Abuse Resources     SUBSTANCE USE TREATMENT for Medicaid and State Funded/IPRS  [facility] [address], [place], [address] [phone] phone NOTE: [facility] is no longer offering IOP services.  Serves those who are low-income or have no insurance.  [facility] [address] [place], [place], [address] [phone] phone [phone] fax NOTE: Does have Substance Abuse-Intensive Outpatient Program [facility]) as well as transitional housing if eligible.  [facility] [address] [place], [place], [address] [phone] phone [phone] fax  [facility] [phone] [place], [place], [address] [phone] phone [phone] fax    HALFWAY HOUSES:  [facility] [phone]  [email or web address]     12 STEP PROGRAMS:  Alcoholics Anonymous of [place] SoftwareChalet.be  Narcotics Anonymous of [place] HitProtect.dk  Al-Anon of [place], [place] www.[place]alanon.org/find-meetings.html  Nar-Anon https://nar-anon.org/find-a-meetin      List of Residential placements:   [facility] in [place]: [phone]  [facility]: [phone]  [place], [place] [phone]: Female and female facility; 30-day program: (uninsured and Medicaid such as Cheryll Corti, [place], [place], partners)  [facility]: [phone]; men and women's facility; 28 days; Can have Medicaid tailored plan Tour manager or Partners)  [facility]: [phone] Milda Aline or Tanis Fan; 28 day program; must be fully detox; tailored Medicaid or no insurance  [address] in [place], [place]; [phone]; 28 day all males program; no insurance accepted  [facility] Referral in [place]: Asa Lauth [phone] (no insurance or Medicaid only); 90 days; outpatient services but provide housing in apartments  [facility]  [facility] Admission: [phone]: Patient must complete phone screening for placement: [place], [place]; 6 month program; uninsured, Medicaid, and [employer].   [facility]: no insurance required; [phone]  [facility]: [phone]; Intake: Porfirio Bristol; Must fill out application online; Charlcie Conger Delay [phone] x [address] [facility] in [place], [place]: [phone]; Admissions Coordinators Mr. Cornel Diesel or Carolynne Citron; 90 day program.  [facility]: [place], [place] [phone]; Co-Ed 9 month to a year program; Online application; Men entry fee is $500 (6-28months);  [employer]: [address] [place], [place] [address]; no fee or insurance required; minimum of 2 years; Highly structured; work based; Intake Coordinator is Larinda Plover [phone]  [facility] in [place], [place]: [phone]; Fax number is [phone]; website: www.[facility].org; Requires 3-6 page autobiography; 2 year program (18 months and then 28month transitional housing); Admission fee is $300; no insurance needed; work Automotive engineer in [place], [place]: [employer] Desk Staff: Annalee Barren [phone]: They have a Men's Regenerations Program 6-40months. Free program; There is an initial $300 fee however, they are willing to work with patients regarding that. Application is online.  [facility]: Admissions [phone] Almeta Arm ext [phone]; Any 7-90 day program is out of pocket; 12 month program is free of charge; there is a $275 entry fee; Patient is responsible for own transportation    .[facility]  [facility] [address] [place], [place], [address] [phone] phone  Offers food and emergency or transitional housing to men, women, or families in need. Clients participate in programs and workshops developed to promote self-sufficiency and personal development.Call or walk in. Applications are accepted Monday, Wednesday,  and Friday by appointment only. Need photo ID and proof of income.  [facility] [address], [place], [place] [address] [phone] Population served: Adult men & women (5 years old  and older, able to perform activities for daily living) Documents required: Valid ID & Social Security Card  [facility] [address] [place], [place]  [address] [phone] Population served: Families with children  [facility] [address] [place], [place]  [address] [phone] Population served: Single women 18+ without dependents Documents required:  Valid ID & Social Security Card  [facility] [address], [place], [place]  [address] [phone] Population served: Female veterans 18+ with substance abuse/mental health issues Eligibility: By referral only  [facility] [address], [place], [place] [address] [phone] Population served: Males 18+ Documents required: Valid ID & Social Security Card  [facility] at [employer] [facility] [place], [employer]. [address] [place] [address] [phone] or [phone] Population served: Pregnant women with or without children  Documents required: Valid ID & Social Security Ship broker of [employer] [address] [place], [place] [address] [phone] Population Served: Families with children  [facility] - [facility] [address] [place], [place] [address] [phone] Population served: Men 18+, preference for disabled and/or veterans Eligibility: By referral only  [facility]) - [facility] [address] [place], [place], [place] [address] [phone] or [phone] Population served: Families with children.    WOMEN ONLY  The Shelter serves up to 20 women each night. Open from December 11th through the end of March in the  evenings from 5:30 pm until 7:30 am, the Shelter provides a hot evening meal, shower and sleeping facilities, and food for the next day. Secure parking is available beside the building.     Shelter Address   Directions [facility] [place] [address] [place], [place]  If you are in need of housing through the shelter, contact the [employer] at [phone].

## 2024-04-11 NOTE — ED Provider Notes (Signed)
 FBC/OBS ASAP Discharge Summary  Date and Time: 04/11/2024 11:35 AM  Name: Andrea Sanford  MRN:  [id number]   Discharge Diagnoses:  Final diagnoses:  Polysubstance use disorder    Subjective: on day of discharge, the patient was awake and alert. She was argumentative and verbally abusive to staff. She advocates for her wants, though her priorities are not very good, they are consistent with her previous goals and thought process. She has significant maladaptive coping skills, and was overall disruptive to the unit, staff and peers due to angry, loud and inappropriate outbursts. She was able to sleep and eat regularly. Her ambulation and though process was more clear with the reductions in medications. She was presented [facility] but her application was denied. Patient provided with community resources to follow up with her substance abuse treatment.  Stay Summary: During the course of patient's hospitalization, the 15-minute checks were adequate to ensure patient's safety. Patient was recommended for outpatient psychiatry follow-up.  At the time of discharge patient is not reporting any acute suicidal/homicidal ideations/AVH, delusional thoughts or paranoia. Patient did not appear to be responding to any internal stimuli. Patient currently denies any new issues or concerns. Education and supportive counseling provided throughout patient's hospital stay & upon discharge.  patient was safely detoxed fom stimulants during the course of her stay, and any symptoms of withdrawal were addressed and have since resolved. At time of discharge the patient is not experiencing clinical syptoms of withdrawal and denies having any subjective symptoms. At time of discharge CIWA was 0.   Today upon discharge evaluation, the patient gives a mood of belligerent. Patient denies any specific concerns and has no new physical complaints. Patient slept well, appetite good, regular bowel movements. Patient feels that the  medications have been helpful & is in agreement to continue current treatment regimen as recommended. Patient was able to engage in safety planning including plan to return to [facility] the nearest emergency room or contact emergency services if patient feels unable to maintain their own safety or the safety of others. Patient had no further questions, comments, or concerns. Patient left [facility]) with all personal belongings in no apparent distress. Transportation per safe transport to home was arranged for patient.  Total Time spent with patient: 30 minutes  Past Psychiatric History: Polysubstance abuse, MDD, Bipolar, PTSD  Past Medical History: HTN, GIRD, DM, Polyneuropathy, Hypokalemia, ... Family History: Patient reports that her mother has similar mental problems and substance abuse Social History: Homeless. Lost custody of her children. Unemployed Tobacco Cessation:  A prescription for an FDA-approved tobacco cessation medication provided at discharge  Current Medications:  Current Facility-Administered Medications  Medication Dose Route Frequency Provider Last Rate Last Admin   acetaminophen  (TYLENOL ) tablet 650 mg  650 mg Oral Q6H PRN Brent, Amanda C, NP   650 mg at 04/11/24 0925   albuterol  (VENTOLIN  HFA) 108 (90 Base) MCG/ACT inhaler 2 puff  2 puff Inhalation Q6H PRN Brent, Amanda C, NP   2 puff at 04/11/24 0614   alum & mag hydroxide-simeth (MAALOX/MYLANTA) 200-200-20 MG/5ML suspension 30 mL  30 mL Oral Q4H PRN Brent, Amanda C, NP       amLODipine  (NORVASC ) tablet 5 mg  5 mg Oral Daily Brent, Amanda C, NP   5 mg at 04/11/24 9083   baclofen  (LIORESAL ) tablet 5 mg  5 mg Oral BID Leigh Corean Massa, MD   5 mg at 04/11/24 0916   haloperidol  (  HALDOL ) tablet 5 mg  5 mg Oral TID PRN Brent, Amanda C, NP       And   diphenhydrAMINE  (BENADRYL ) capsule 50 mg  50 mg Oral TID PRN Brent, Amanda C, NP       haloperidol  lactate (HALDOL )  injection 5 mg  5 mg Intramuscular TID PRN Brent, Amanda C, NP       And   diphenhydrAMINE  (BENADRYL ) injection 50 mg  50 mg Intramuscular TID PRN Brent, Amanda C, NP       And   LORazepam  (ATIVAN ) injection 2 mg  2 mg Intramuscular TID PRN Brent, Amanda C, NP       haloperidol  lactate (HALDOL ) injection 10 mg  10 mg Intramuscular TID PRN Brent, Amanda C, NP       And   diphenhydrAMINE  (BENADRYL ) injection 50 mg  50 mg Intramuscular TID PRN Brent, Amanda C, NP       And   LORazepam  (ATIVAN ) injection 2 mg  2 mg Intramuscular TID PRN Brent, Amanda C, NP       docusate sodium  (COLACE) capsule 100 mg  100 mg Oral Daily Treshun Wold, Corean Massa, MD   100 mg at 04/11/24 0916   feeding supplement (ENSURE PLUS HIGH PROTEIN) liquid 237 mL  237 mL Oral BID BM Esvin Hnat, Corean Massa, MD   237 mL at 04/11/24 1030   gabapentin  (NEURONTIN ) capsule 600 mg  600 mg Oral TID Randall Starlyn HERO, NP   600 mg at 04/11/24 9083   hydrOXYzine  (ATARAX ) tablet 25 mg  25 mg Oral TID PRN Brent, Amanda C, NP   25 mg at 04/09/24 1141   lactulose  (CHRONULAC ) 10 GM/15ML solution 10 g  10 g Oral BID Leigh Corean Massa, MD   10 g at 04/11/24 0915   lidocaine  (LIDODERM ) 5 % 1 patch  1 patch Transdermal Q24H Leigh Corean Massa, MD   1 patch at 04/10/24 1135   magnesium  hydroxide (MILK OF MAGNESIA) suspension 30 mL  30 mL Oral Daily PRN Brent, Amanda C, NP       nicotine  (NICODERM CQ  - dosed in mg/24 hours) patch 21 mg  21 mg Transdermal Daily Byungura, Veronique M, NP   21 mg at 04/11/24 9083   OLANZapine  (ZYPREXA ) injection 5 mg  5 mg Intramuscular TID PRN Hampton, Tracie B, NP       potassium chloride  SA (KLOR-CON  M) CR tablet 20 mEq  20 mEq Oral Daily Randall, Veronique M, NP   20 mEq at 04/11/24 9083   QUEtiapine  (SEROQUEL ) tablet 400 mg  400 mg Oral QHS Hampton, Tracie B, NP   400 mg at 04/10/24 2104   traZODone  (DESYREL ) tablet 50 mg  50 mg Oral QHS PRN Brent, Amanda C, NP   50 mg at 04/10/24 2105   Current  Outpatient Medications  Medication Sig Dispense Refill   albuterol  (VENTOLIN  HFA) 108 (90 Base) MCG/ACT inhaler Inhale 2 puffs into the lungs every 6 (six) hours as needed for wheezing or shortness of breath. 8 g 5   amLODipine  (NORVASC ) 5 MG tablet Take 1 tablet (5 mg total) by mouth daily. 90 tablet 1   Aspirin-Salicylamide-Caffeine  (ARTHRITIS STRENGTH BC POWDER PO) Take 3 packets by mouth every 6 (six) hours as needed (For pain).     atorvastatin  (LIPITOR) 40 MG tablet Take 40 mg by mouth daily.     montelukast  (SINGULAIR ) 10 MG tablet Take 10 mg by mouth daily.     baclofen  5 MG TABS Take  1 tablet (5 mg total) by mouth 2 (two) times daily. Taper dose: Stop after 2 days 4 each 0   gabapentin  (NEURONTIN ) 300 MG capsule Take 2 capsules (600 mg total) by mouth 3 (three) times daily. 90 capsule 0   lactulose  (CHRONULAC ) 10 GM/15ML solution Take 15 mLs (10 g total) by mouth 2 (two) times daily. 236 mL 0   [START ON 04/12/2024] lidocaine  (LIDODERM ) 5 % Place 1 patch onto the skin daily. Remove & Discard patch within 12 hours or as directed by MD 15 patch 0   [START ON 04/12/2024] nicotine  (NICODERM CQ  - DOSED IN MG/24 HOURS) 21 mg/24hr patch Place 1 patch (21 mg total) onto the skin daily. 7 patch 0   QUEtiapine  (SEROQUEL ) 400 MG tablet Take 1 tablet (400 mg total) by mouth at bedtime. 15 tablet 0    PTA Medications:  Facility Ordered Medications  Medication   [COMPLETED] ibuprofen  (ADVIL ) tablet 600 mg   acetaminophen  (TYLENOL ) tablet 650 mg   alum & mag hydroxide-simeth (MAALOX/MYLANTA) 200-200-20 MG/5ML suspension 30 mL   magnesium  hydroxide (MILK OF MAGNESIA) suspension 30 mL   hydrOXYzine  (ATARAX ) tablet 25 mg   traZODone  (DESYREL ) tablet 50 mg   haloperidol  (HALDOL ) tablet 5 mg   And   diphenhydrAMINE  (BENADRYL ) capsule 50 mg   haloperidol  lactate (HALDOL ) injection 5 mg   And   diphenhydrAMINE  (BENADRYL ) injection 50 mg   And   LORazepam  (ATIVAN ) injection 2 mg   haloperidol  lactate  (HALDOL ) injection 10 mg   And   diphenhydrAMINE  (BENADRYL ) injection 50 mg   And   LORazepam  (ATIVAN ) injection 2 mg   albuterol  (VENTOLIN  HFA) 108 (90 Base) MCG/ACT inhaler 2 puff   amLODipine  (NORVASC ) tablet 5 mg   potassium chloride  SA (KLOR-CON  M) CR tablet 20 mEq   [COMPLETED] baclofen  (LIORESAL ) tablet 10 mg   gabapentin  (NEURONTIN ) capsule 600 mg   nicotine  (NICODERM CQ  - dosed in mg/24 hours) patch 21 mg   [COMPLETED] phenylephrine -shark liver oil-mineral oil-petrolatum  (PREPARATION H) rectal ointment 1 Application   docusate sodium  (COLACE) capsule 100 mg   QUEtiapine  (SEROQUEL ) tablet 400 mg   OLANZapine  (ZYPREXA ) injection 5 mg   lactulose  (CHRONULAC ) 10 GM/15ML solution 10 g   baclofen  (LIORESAL ) tablet 5 mg   lidocaine  (LIDODERM ) 5 % 1 patch   feeding supplement (ENSURE PLUS HIGH PROTEIN) liquid 237 mL   PTA Medications  Medication Sig   amLODipine  (NORVASC ) 5 MG tablet Take 1 tablet (5 mg total) by mouth daily.   albuterol  (VENTOLIN  HFA) 108 (90 Base) MCG/ACT inhaler Inhale 2 puffs into the lungs every 6 (six) hours as needed for wheezing or shortness of breath.   Aspirin-Salicylamide-Caffeine  (ARTHRITIS STRENGTH BC POWDER PO) Take 3 packets by mouth every 6 (six) hours as needed (For pain).   montelukast  (SINGULAIR ) 10 MG tablet Take 10 mg by mouth daily.   atorvastatin  (LIPITOR) 40 MG tablet Take 40 mg by mouth daily.   [START ON 04/12/2024] nicotine  (NICODERM CQ  - DOSED IN MG/24 HOURS) 21 mg/24hr patch Place 1 patch (21 mg total) onto the skin daily.   QUEtiapine  (SEROQUEL ) 400 MG tablet Take 1 tablet (400 mg total) by mouth at bedtime.   lactulose  (CHRONULAC ) 10 GM/15ML solution Take 15 mLs (10 g total) by mouth 2 (two) times daily.   baclofen  5 MG TABS Take 1 tablet (5 mg total) by mouth 2 (two) times daily. Taper dose: Stop after 2 days   gabapentin  (NEURONTIN ) 300 MG capsule Take  2 capsules (600 mg total) by mouth 3 (three) times daily.   [START ON 04/12/2024]  lidocaine  (LIDODERM ) 5 % Place 1 patch onto the skin daily. Remove & Discard patch within 12 hours or as directed by MD       04/07/2024    3:38 PM 04/02/2024   12:10 PM 09/29/2023   10:57 AM  Depression screen PHQ 2/9  Decreased Interest 3 2 2   Down, Depressed, Hopeless 3 1 1   PHQ - 2 Score 6 3 3   Altered sleeping 3 2 2   Tired, decreased energy 3 1 2   Change in appetite 0 2 2  Feeling bad or failure about yourself  3 2 2   Trouble concentrating 3 1 1   Moving slowly or fidgety/restless 3 0 1  Suicidal thoughts 3 0 3  PHQ-9 Score 24 11 16   Difficult doing work/chores Very difficult  Somewhat difficult    Flowsheet Row ED from 04/07/2024 in [facility] ED from 03/28/2024 in [facility] Department at [facility] ED from 11/21/2023 in [facility] Emergency Department at [facility]  C-SSRS RISK CATEGORY Low Risk No Risk No Risk    Musculoskeletal  Strength & Muscle Tone: within normal limits Gait & Station: normal Patient leans: N/A  Psychiatric Specialty Exam  Presentation  General Appearance:  Casual  Eye Contact: Good  Speech: Normal Rate  Speech Volume: Normal  Handedness: Right   Mood and Affect  Mood: Irritable  Affect: Congruent   Thought Process  Thought Processes: Goal Directed  Descriptions of Associations:Intact  Orientation:Full (Time, Place and Person)  Thought Content:Logical  Diagnosis of Schizophrenia or Schizoaffective disorder in past: No    Hallucinations:Hallucinations: None  Ideas of Reference:None  Suicidal Thoughts:Suicidal Thoughts: No  Homicidal Thoughts:Homicidal Thoughts: No   Sensorium  Memory: Immediate Fair; Recent Fair; Remote Poor  Judgment: Fair  Insight: Fair   Chartered certified accountant: Fair  Attention Span: Fair  Recall: [employer] of Knowledge: Fair  Language: Fair   Psychomotor Activity  Psychomotor Activity: Psychomotor  Activity: Normal Extrapyramidal Side Effects (EPS): Tardive Dyskinesia AIMS Completed?: Yes   Assets  Assets: Leisure Time   Sleep  Sleep: Sleep: Good  Estimated Sleeping Duration (Last 24 Hours): 4.00-5.50 hours  No data recorded  Physical Exam  Physical Exam Vitals and nursing note reviewed.  Constitutional:      Appearance: Normal appearance.  HENT:     Head: Normocephalic and atraumatic.  Eyes:     Extraocular Movements: Extraocular movements intact.  Pulmonary:     Effort: Pulmonary effort is normal.  Musculoskeletal:        General: Normal range of motion.     Cervical back: Normal range of motion.  Neurological:     General: No focal deficit present.     Mental Status: She is alert and oriented to person, place, and time.  Psychiatric:     Comments: Irritable, argumentative    Review of Systems  Constitutional:  Negative for diaphoresis and fever.  Respiratory:  Negative for cough and shortness of breath.   Cardiovascular:  Negative for chest pain.  Gastrointestinal:  Negative for constipation, diarrhea, nausea and vomiting.  Genitourinary:  Negative for dysuria.  Musculoskeletal:  Negative for joint pain and myalgias.  Skin:  Negative for rash.  Neurological:  Negative for dizziness and headaches.  Psychiatric/Behavioral:  Negative for hallucinations and suicidal ideas.    Blood pressure 128/88, pulse 85, temperature (!) 97.5 F (36.4 C),  resp. rate 18, SpO2 100%. There is no height or weight on file to calculate BMI.  Demographic Factors:  Caucasian, Low socioeconomic status, and Unemployed  Loss Factors: NA  Historical Factors: Prior suicide attempts and Impulsivity  Risk Reduction Factors:   NA  Continued Clinical Symptoms:  Alcohol/Substance Abuse/Dependencies  Cognitive Features That Contribute To Risk:  Polarized thinking    Suicide Risk:  Minimal: No identifiable suicidal ideation.  Patients presenting with no risk factors but  with morbid ruminations; may be classified as minimal risk based on the severity of the depressive symptoms  Plan Of Care/Follow-up recommendations:  Activity:  as tolerated Diet:  low sodium, low fat  Disposition: discharge to shelter with outpatient resources and follow up  Corean Anette Potters, MD 04/11/2024, 11:35 AM

## 2024-04-12 NOTE — Telephone Encounter (Signed)
 Left vm x 2  Copied from CRM #[id number]. Topic: General - Call Back - No Documentation >> Apr 12, 2024  8:22 AM Avram MATSU wrote: Reason for CRM: patient stated she would like a call from [place]. Pt tried reaching her but was not able to get connect with the number provided. Please advise [phone] >> Apr 12, 2024  8:58 AM CMA Warren H wrote: No answer need more info

## 2024-04-18 ENCOUNTER — Telehealth: Payer: Self-pay

## 2024-04-18 ENCOUNTER — Inpatient Hospital Stay: Admitting: Family Medicine

## 2024-04-18 ENCOUNTER — Telehealth: Admitting: *Deleted

## 2024-04-18 NOTE — Telephone Encounter (Signed)
 Copied from CRM #[id number]. Topic: Clinical - Medical Advice >> Apr 17, 2024  3:40 PM Emylou G wrote: Reason for CRM: Patient called.. pls call her back with the number for [facility] POPULATION HEALTH DEPARTMENT - [place], [place] .SABRA Needs to discuss appt

## 2024-04-23 ENCOUNTER — Telehealth: Payer: Self-pay | Admitting: *Deleted

## 2024-04-23 ENCOUNTER — Encounter: Payer: Self-pay | Admitting: *Deleted

## 2024-04-23 NOTE — Congregational Nurse Program (Signed)
  Dept: [phone]   Congregational Nurse Program Note  Date of Encounter: 04/23/2024 Client into nurse only clinic requesting assistance getting back into [facility]. Client adament that she wants her mother, Roderick Fryer, off all records to receive her health/mental health records or information. States she just returned from residential treatment. Unsure where she will sleep tonight but gets check today so maybe hotel. Random, forced speech. Restless. Poor eye contact. Joint call to [facility] health completed. They reinforced that there is a Do Not Return on her chart so they are unable to schedule her back into their clinic.They provided Dr Starla as a contact in [place] but that phone was disconnected. Encouraged client to follow up with primary MD as scheduled tomorrow. Agrees. Provided bus pass. Client met with [facility] staff about housing etc today as well. Follow up prn. Rhermann, RN Past Medical History: Past Medical History:  Diagnosis Date   Allergy    Anxiety    Arthritis    Asthma    Bipolar 1 disorder (HCC)    Cancer (HCC)    COPD (chronic obstructive pulmonary disease) (HCC)    Depression    Diabetes mellitus without complication (HCC)    Drug abuse (HCC)    Hypertension    Neuromuscular disorder (HCC)    Osteoporosis    PTSD (post-traumatic stress disorder) 04/07/2024   Sleep apnea     Encounter Details:  Community Questionnaire - 04/23/24 0930       Questionnaire   Ask client: Do you give verbal consent for me to treat you today? Yes    Herbalist Nurse    Location Patient Served  [facility]    Encounter Setting CN site    Population Status Unhoused    Insurance [place]    Insurance/Financial Assistance Referral N/A    Medication Have Medication Insecurities    Medical Provider Yes    Screening Referrals Made N/A    Medical Referrals Made N/A    Medical Appointment Completed Non-[facility]    CNP Interventions Advocate/Support;Educate;Navigate Healthcare System    Screenings CN Performed N/A    ED Visit Averted N/A    Life-Saving Intervention Made N/A

## 2024-04-24 ENCOUNTER — Other Ambulatory Visit: Payer: Self-pay

## 2024-04-24 ENCOUNTER — Telehealth: Payer: Self-pay | Admitting: *Deleted

## 2024-04-24 ENCOUNTER — Other Ambulatory Visit: Payer: Self-pay | Admitting: Family Medicine

## 2024-04-24 ENCOUNTER — Ambulatory Visit (INDEPENDENT_AMBULATORY_CARE_PROVIDER_SITE_OTHER): Admitting: Family Medicine

## 2024-04-24 VITALS — BP 154/97 | HR 90 | Temp 98.2°F | Resp 22 | Ht 66.0 in | Wt 169.0 lb

## 2024-04-24 DIAGNOSIS — S139XXS Sprain of joints and ligaments of unspecified parts of neck, sequela: Secondary | ICD-10-CM | POA: Diagnosis not present

## 2024-04-24 DIAGNOSIS — R7989 Other specified abnormal findings of blood chemistry: Secondary | ICD-10-CM | POA: Diagnosis not present

## 2024-04-24 DIAGNOSIS — M62838 Other muscle spasm: Secondary | ICD-10-CM | POA: Diagnosis not present

## 2024-04-24 DIAGNOSIS — E1140 Type 2 diabetes mellitus with diabetic neuropathy, unspecified: Secondary | ICD-10-CM | POA: Diagnosis not present

## 2024-04-24 DIAGNOSIS — M542 Cervicalgia: Secondary | ICD-10-CM

## 2024-04-24 LAB — POCT GLYCOSYLATED HEMOGLOBIN (HGB A1C): Hemoglobin A1C: 6.2 % — AB (ref 4.0–5.6)

## 2024-04-24 MED ORDER — BACLOFEN 5 MG PO TABS: 5.0000 mg | ORAL_TABLET | Freq: Two times a day (BID) | ORAL | 0 refills | Status: DC

## 2024-04-24 NOTE — Patient Outreach (Signed)
 Unsuccessful phone call attempt yesterday to follow up with patient. Phone call to patient's providers office as appointment noted today for contact. Patient left provider's office before this social worker could speak with her. CSW to continue contact efforts.  Lakrisha Iseman, LCSW [place]  [facility], [facility] Licensed Clinical Social Worker  Direct Dial: [phone]

## 2024-04-24 NOTE — Assessment & Plan Note (Signed)
 Ammonia was found to be 65 when she was hospitalized.  She was offered lactulose  but is not taking this.

## 2024-04-24 NOTE — Progress Notes (Signed)
   Established Patient Office Visit  Subjective   Patient ID: Andrea Sanford, female    DOB: 03/24/1971  Age: 53 y.o. MRN: [id number]  Chief Complaint  Patient presents with   Hospitalization Follow-up    HPI Andrea Sanford was scheduled for a post hospitalization follow-up.  She is had a psychiatric admission for polysubstance abuse and was successfully weaned.  She has been using cocaine/crack and alcohol.  She wanted to go to [facility] as a follow up but there is a Do not return  on her chart so she cannot go to this clinic.  Dr. Starla was a contact in [place] but that phone number was disconnected.  She is working with [employer] about housing.  She is pacing around the room and talking with pressured speech.  She is clearly angry.   She asked for a baclofen  refill.  Her baclofen  was decreased to 5 mg twice a day and then stopped while she was hospitalized.  She is still on gabapentin  300 mg capsules by mouth 3 times a day. She reports that her neck is very painful .    She wants to know why I did not check an ammonia level on her.  Her ammonia level was found to be elevated at 65 when she was hospitalized. She is disappointed that I did not check her ammonia level.  She was given a prescription for lactulose  but is not taking this.    She reported that her mother is selling drugs and that her mother does not tell the truth.  She does not want any of her information to be given to her mother.  She does not want me talking to her mother about her.   She left the clinic stating that she had to get a ride now.   Her A1c was 6.2%.   Objective:     BP (!) 154/97 (BP Location: Left Arm, Patient Position: Sitting, Cuff Size: Normal)   Pulse 90   Temp 98.2 F (36.8 C) (Oral)   Resp (!) 22   Ht 5' 6 (1.676 m)   Wt 169 lb (76.7 kg)   SpO2 98%   BMI 27.28 kg/m    Physical Exam       Results for orders placed or performed in visit on 04/24/24  POCT  glycosylated hemoglobin (Hb A1C)  Result Value Ref Range   Hemoglobin A1C 6.2 (A) 4.0 - 5.6 %   HbA1c POC (<> result, manual entry)     HbA1c, POC (prediabetic range)     HbA1c, POC (controlled diabetic range)        The ASCVD Risk score (Arnett DK, et al., 2019) failed to calculate for the following reasons:   The valid total cholesterol range is 130 to 320 mg/dL    Assessment & Plan:  Type 2 diabetes mellitus with diabetic neuropathy, without long-term current use of insulin  (HCC) -     POCT glycosylated hemoglobin (Hb A1C)  Neck muscle spasm -     Baclofen ; Take 1 tablet (5 mg total) by mouth 2 (two) times daily. Taper dose: Stop after 2 days  Dispense: 60 tablet; Refill: 0  Increased ammonia level Assessment & Plan: Ammonia was found to be 65 when she was hospitalized.  She was offered lactulose  but is not taking this.      No follow-ups on file.    Andrea Elliston K Sherlyn Ebbert, MD

## 2024-05-13 ENCOUNTER — Telehealth: Admitting: Family Medicine

## 2024-05-13 ENCOUNTER — Telehealth: Payer: Self-pay | Admitting: Family Medicine

## 2024-05-13 ENCOUNTER — Telehealth: Payer: Self-pay

## 2024-05-13 DIAGNOSIS — S139XXS Sprain of joints and ligaments of unspecified parts of neck, sequela: Secondary | ICD-10-CM | POA: Diagnosis not present

## 2024-05-13 DIAGNOSIS — F339 Major depressive disorder, recurrent, unspecified: Secondary | ICD-10-CM

## 2024-05-13 DIAGNOSIS — M62838 Other muscle spasm: Secondary | ICD-10-CM

## 2024-05-13 DIAGNOSIS — J441 Chronic obstructive pulmonary disease with (acute) exacerbation: Secondary | ICD-10-CM | POA: Diagnosis not present

## 2024-05-13 DIAGNOSIS — Z23 Encounter for immunization: Secondary | ICD-10-CM | POA: Insufficient documentation

## 2024-05-13 MED ORDER — BACLOFEN 10 MG PO TABS: 10.0000 mg | ORAL_TABLET | Freq: Three times a day (TID) | ORAL | 0 refills | Status: DC

## 2024-05-13 MED ORDER — ALBUTEROL SULFATE HFA 108 (90 BASE) MCG/ACT IN AERS: 2.0000 | INHALATION_SPRAY | Freq: Four times a day (QID) | RESPIRATORY_TRACT | 5 refills | Status: DC | PRN

## 2024-05-13 MED ORDER — DOXYCYCLINE HYCLATE 100 MG PO CAPS: 100.0000 mg | ORAL_CAPSULE | Freq: Two times a day (BID) | ORAL | 0 refills | Status: DC

## 2024-05-13 MED ORDER — PREDNISONE 10 MG (48) PO TBPK: ORAL_TABLET | Freq: Every day | ORAL | 0 refills | Status: DC

## 2024-05-13 MED ORDER — QUETIAPINE FUMARATE 400 MG PO TABS: 400.0000 mg | ORAL_TABLET | Freq: Every day | ORAL | 2 refills | Status: DC

## 2024-05-13 NOTE — Assessment & Plan Note (Signed)
 She was on Seroquel  400 mg nightly when she was in the psychiatric hospital.  She is off Effexor  now.  Will refill her Seroquel .

## 2024-05-13 NOTE — Telephone Encounter (Signed)
 Was unable to reach patient nor leave VM message due to mailbox not being set up.

## 2024-05-13 NOTE — Telephone Encounter (Signed)
 Copied from CRM #[id number]. Topic: General - Other >> May 13, 2024  2:46 PM Shanda MATSU wrote: Reason for CRM: Patient returned call made to her about possibly seeing another provoder, patient stated she is not comfortable seeing Dr. Ziglar as her mom sees her as well and feels her mom provider Dr. Ziglar with information that she does not need to know, I called [facility] and spoke with Cena who stated she will check with the other providers and call the patient bck as patient disconnected while on hold.  Patient was contacted by Cena. Patient states she has a virtual appt this afternoon and will talk to Dr. Ziglar about it. [facility] is not accepting a TOC.

## 2024-05-13 NOTE — Assessment & Plan Note (Signed)
 Refilled her baclofen  10 mg 3 times daily for her neck pain.  She would like to see a pain management doctor for steroid injections.

## 2024-05-13 NOTE — Assessment & Plan Note (Signed)
 Sounds like she has a sinus infection and an exacerbation of her COPD.  Albuterol  inhaler, steroid Dosepak and doxycycline  100 mg twice daily for 10 days.

## 2024-05-13 NOTE — Telephone Encounter (Signed)
 Copied from CRM #[id number]. Topic: Clinical - Medical Advice >> May 13, 2024  1:27 PM Andrea Sanford wrote: Reason for CRM: pt wants to see a different provider other than Dr Ziglar, family issue. Please call pt and advise.
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
## 2024-05-13 NOTE — Assessment & Plan Note (Signed)
 Refilled her baclofen  at 10 mg 3 times daily.

## 2024-05-13 NOTE — Assessment & Plan Note (Signed)
 Encouraged her to get a flu shot when she goes to the pharmacy.

## 2024-05-15 ENCOUNTER — Other Ambulatory Visit: Payer: Self-pay | Admitting: *Deleted

## 2024-05-15 NOTE — Patient Instructions (Signed)
 Andrea Sanford - I am sorry I was unable to reach you today. I work with Ziglar, Susan K, MD and am calling to support your healthcare needs. Please contact me at [phone] at your earliest convenience. I look forward to speaking with you soon.    Thank you,    Traivon Morrical, LCSW [place]  [facility], [facility] Licensed Clinical Social Worker  Direct Dial: [phone]

## 2024-05-21 ENCOUNTER — Other Ambulatory Visit (HOSPITAL_COMMUNITY)
Admission: RE | Admit: 2024-05-21 | Discharge: 2024-05-21 | Disposition: A | Discharge status: Home / Self Care | Source: Ambulatory Visit | Attending: Family Medicine | Admitting: Family Medicine

## 2024-05-21 ENCOUNTER — Ambulatory Visit (INDEPENDENT_AMBULATORY_CARE_PROVIDER_SITE_OTHER): Admitting: Family Medicine

## 2024-05-21 ENCOUNTER — Encounter: Payer: Self-pay | Admitting: Family Medicine

## 2024-05-21 VITALS — BP 159/101 | HR 84 | Temp 98.0°F | Resp 22 | Ht 66.0 in | Wt 170.0 lb

## 2024-05-21 DIAGNOSIS — F339 Major depressive disorder, recurrent, unspecified: Secondary | ICD-10-CM

## 2024-05-21 DIAGNOSIS — J441 Chronic obstructive pulmonary disease with (acute) exacerbation: Secondary | ICD-10-CM

## 2024-05-21 DIAGNOSIS — Z124 Encounter for screening for malignant neoplasm of cervix: Secondary | ICD-10-CM | POA: Insufficient documentation

## 2024-05-21 DIAGNOSIS — I10 Essential (primary) hypertension: Secondary | ICD-10-CM | POA: Diagnosis not present

## 2024-05-21 DIAGNOSIS — J9500 Unspecified tracheostomy complication: Secondary | ICD-10-CM | POA: Diagnosis not present

## 2024-05-21 DIAGNOSIS — Z23 Encounter for immunization: Secondary | ICD-10-CM | POA: Diagnosis not present

## 2024-05-21 DIAGNOSIS — Z930 Tracheostomy status: Secondary | ICD-10-CM

## 2024-05-21 DIAGNOSIS — M62838 Other muscle spasm: Secondary | ICD-10-CM | POA: Diagnosis not present

## 2024-05-21 DIAGNOSIS — Z202 Contact with and (suspected) exposure to infections with a predominantly sexual mode of transmission: Secondary | ICD-10-CM

## 2024-05-21 MED ORDER — MONTELUKAST SODIUM 10 MG PO TABS: 10.0000 mg | ORAL_TABLET | Freq: Every day | ORAL | 5 refills | Status: DC

## 2024-05-21 MED ORDER — QUETIAPINE FUMARATE 400 MG PO TABS: 400.0000 mg | ORAL_TABLET | Freq: Every day | ORAL | 2 refills | Status: DC

## 2024-05-21 MED ORDER — ALBUTEROL SULFATE HFA 108 (90 BASE) MCG/ACT IN AERS: 2.0000 | INHALATION_SPRAY | Freq: Four times a day (QID) | RESPIRATORY_TRACT | 5 refills | Status: DC | PRN

## 2024-05-21 MED ORDER — ATORVASTATIN CALCIUM 40 MG PO TABS: 40.0000 mg | ORAL_TABLET | Freq: Every day | ORAL | 5 refills | Status: AC

## 2024-05-21 MED ORDER — BACLOFEN 10 MG PO TABS: 10.0000 mg | ORAL_TABLET | Freq: Three times a day (TID) | ORAL | 0 refills | Status: DC

## 2024-05-21 MED ORDER — ACCU-CHEK GUIDE TEST VI STRP: 1.0000 | ORAL_STRIP | Freq: Two times a day (BID) | 5 refills | Status: DC

## 2024-05-21 MED ORDER — DOXYCYCLINE HYCLATE 100 MG PO CAPS: 100.0000 mg | ORAL_CAPSULE | Freq: Two times a day (BID) | ORAL | 0 refills | Status: DC

## 2024-05-21 MED ORDER — AMLODIPINE BESYLATE 5 MG PO TABS: 5.0000 mg | ORAL_TABLET | Freq: Every day | ORAL | 1 refills | Status: AC

## 2024-05-21 NOTE — Progress Notes (Unsigned)
 Established Patient Office Visit  Subjective   Patient ID: Andrea Sanford, female    DOB: Jul 28, 1970  Age: 53 y.o. MRN: [id number]  Chief Complaint  Patient presents with   Annual Exam    HPI  [name] 53 yo woman with DMT2, (09/14/2023 A1c 6.8%, diet-controlled), HTN, GAD/bipolar disorder/polysubstance abuse mixed hyperlipidemia, smoker with polysubstance abuse (cocaine addiction and alcohol),  multiple drug overdoses causing acute hypoxic respiratory failure requiring endotracheal intubation and G-tube placement and tracheostomy site has not closed.    She has lost all of her medications.  She is no longer living with her mother and has been homeless.  [place], [place].  She is away from the drugs.   She has lost all of her medications and needs them all refilled.  She is here for a physical exam including a Pap test.  She would like to be tested for STDs.  She is having no symptoms.  She denies vaginal discharge, suprapubic pain dysuria or frequency. Her tracheostomy site has not closed.  She hears air whistling through it and it has a clear discharge.             Objective:     BP (!) 159/101 (BP Location: Left Arm, Patient Position: Sitting, Cuff Size: Normal)   Pulse 84   Temp 98 F (36.7 C) (Oral)   Resp (!) 22   Ht 5' 6 (1.676 m)   Wt 170 lb (77.1 kg)   SpO2 96%   BMI 27.44 kg/m    Physical Exam Vitals and nursing note reviewed. Exam conducted with a chaperone present.  Constitutional:      Appearance: Normal appearance.  HENT:     Head: Normocephalic and atraumatic.  Eyes:     Conjunctiva/sclera: Conjunctivae normal.  Cardiovascular:     Rate and Rhythm: Normal rate and regular rhythm.  Pulmonary:     Effort: Pulmonary effort is normal.     Breath sounds: Normal breath sounds.  Abdominal:     General: Abdomen is flat. Bowel sounds are normal.     Palpations: Abdomen is soft.     Tenderness:  There is no abdominal tenderness.  Genitourinary:    Exam position: Lithotomy position.     Labia:        Right: No rash.        Left: No rash.      Vagina: Normal.     Cervix: Normal.     Uterus: Normal.      Adnexa: Right adnexa normal and left adnexa normal.  Musculoskeletal:     Right lower leg: No edema.     Left lower leg: No edema.  Skin:    General: Skin is warm and dry.  Neurological:     Mental Status: She is alert and oriented to person, place, and time.  Psychiatric:        Mood and Affect: Mood normal.        Behavior: Behavior normal.        Thought Content: Thought content normal.        Judgment: Judgment normal.          No results found for any visits on 05/21/24.    The ASCVD Risk score (Arnett DK, et al., 2019) failed to calculate for the following reasons:   The valid total cholesterol range is 130 to 320 mg/dL    Assessment & Plan:  Screening  for cervical cancer -     Cytology - PAP  COPD exacerbation (HCC) -     Albuterol  Sulfate HFA; Inhale 2 puffs into the lungs every 6 (six) hours as needed for wheezing.  Dispense: 2 each; Refill: 5 -     Doxycycline  Hyclate; Take 1 capsule (100 mg total) by mouth 2 (two) times daily.  Dispense: 20 capsule; Refill: 0  Primary hypertension -     amLODIPine  Besylate; Take 1 tablet (5 mg total) by mouth daily.  Dispense: 90 tablet; Refill: 1 -     CBC with Differential/Platelet -     Comprehensive metabolic panel with GFR -     TSH + free T4  Neck muscle spasm -     Baclofen ; Take 1 tablet (10 mg total) by mouth 3 (three) times daily.  Dispense: 90 each; Refill: 0  Depression, recurrent -     QUEtiapine  Fumarate; Take 1 tablet (400 mg total) by mouth at bedtime.  Dispense: 30 tablet; Refill: 2  Possible exposure to STI -     HIV Antibody (routine testing w rflx) -     RPR -     HSV(herpes simplex vrs) 1+2 ab-IgG  Immunization due -     Flu vaccine trivalent PF, 6mos and  older(Flulaval,Afluria,Fluarix,Fluzone )  Tracheostomy status (HCC) -     Ambulatory referral to General Surgery  Tracheostomy complication, unspecified complication type [facility]) Assessment & Plan: Tracheostomy did not close.  Will send her to general surgery to see about secondary closure.   Other orders -     Accu-Chek Guide Test; 1 each by Other route 2 (two) times daily.  Dispense: 100 each; Refill: 5 -     Atorvastatin  Calcium ; Take 1 tablet (40 mg total) by mouth daily.  Dispense: 30 tablet; Refill: 5 -     Montelukast  Sodium; Take 1 tablet (10 mg total) by mouth at bedtime.  Dispense: 30 tablet; Refill: 5     Return in about 4 weeks (around 06/18/2024).    Rickiya Picariello K Yailine Ballard, MD

## 2024-05-22 DIAGNOSIS — J9500 Unspecified tracheostomy complication: Secondary | ICD-10-CM | POA: Insufficient documentation

## 2024-05-22 NOTE — Assessment & Plan Note (Signed)
 Tracheostomy did not close.  Will send her to general surgery to see about secondary closure.

## 2024-05-23 ENCOUNTER — Telehealth: Payer: Self-pay | Admitting: *Deleted

## 2024-05-23 NOTE — Patient Outreach (Signed)
 Phone call from patient to request assistance with housing. Patient currently living with friends in [place]. Patient agreeable-consent obtained to referral to the ACT Team.  Follow up scheduled for 06/12/24.   Brittani Purdum, LCSW [place]  [facility], [facility] Licensed Clinical Social Worker  Direct Dial: [phone]

## 2024-05-24 NOTE — Telephone Encounter (Signed)
 This encounter was created in error - please disregard.

## 2024-05-28 ENCOUNTER — Ambulatory Visit: Payer: Self-pay

## 2024-05-28 LAB — CYTOLOGY - PAP
Chlamydia: NEGATIVE
Comment: NEGATIVE
Comment: NEGATIVE
Comment: NEGATIVE
Comment: NEGATIVE
Comment: NORMAL
HSV1: NEGATIVE
HSV2: NEGATIVE
High risk HPV: POSITIVE — AB
Neisseria Gonorrhea: NEGATIVE
Trichomonas: NEGATIVE

## 2024-05-28 NOTE — Telephone Encounter (Signed)
  Patient states that she was supposed to be prescribed something for her feet pain by Dr Ziglar as they discussed at her last visit on 05/21/2024 Patient also states that she hasn't heard from anyone from general surgery about her tracheostomy    FYI Only or Action Required?: Action required by provider: clinical question for provider and update on patient condition.  Patient was last seen in primary care on 05/21/2024 by Ziglar, Susan K, MD.  Called Nurse Triage reporting Foot Pain.  Symptoms began patient states years ago but worse in the past few months.  Interventions attempted: Rest, hydration, or home remedies.  Symptoms are: gradually worsening.  Triage Disposition: See PCP Within 2 Weeks  Patient/caregiver understands and will follow disposition?: No, wishes to speak with PCP      Reason for Triage: pt called yesterday about pain meds for feet please reach back out to pt to assist   Reason for Disposition  Foot pain is a chronic symptom (recurrent or ongoing AND present > 4 weeks)  Answer Assessment - Initial Assessment Questions Patient states that she was supposed to be prescribed something for her feet pain ---bilateral foot pain for years ---states she spoke with her PCP Dr Susan Ziglar about them and she states that Dr Ziglar told her she would send her in a prescription for her feet pain but hasn't sent it in yet ---Left big toe behind the nail has a lot of pain ---Right foot--behind the toes on top hurts when she steps and the foot bends  Patient also states that she hasn't heard from anyone from general surgery about her tracheostomy  Patient is advised to call us  back if anything changes or with any further questions/concerns. Patient is advised that if anything worsens to go to the Emergency Room. Patient verbalized understanding.  Protocols used: Foot Pain-A-AH

## 2024-05-29 ENCOUNTER — Ambulatory Visit: Payer: Self-pay

## 2024-05-29 NOTE — Telephone Encounter (Signed)
 Copied from CRM [id number]. Topic: Clinical - Prescription Issue >> May 27, 2024 11:10 AM Kendralyn S wrote: Reason for CRM: pt wondering if she got a new rx for something for her foot pain from the appt 10/28, mentioned celexa

## 2024-05-29 NOTE — Telephone Encounter (Signed)
 Patient calling again stating that she was waiting Patient believes that she was told possibly Celexa for her feet pain and has not heard back from the office  FYI Only or Action Required?: Action required by provider: patient requesting medication.  Patient was last seen in primary care on 05/21/2024 by Ziglar, Susan K, MD.  Called Nurse Triage reporting Foot Pain.  Triage Disposition: Call PCP When Office is Open  Patient/caregiver understands and will follow disposition?:                Copied from CRM [id number]. Topic: Clinical - Red Word Triage >> May 29, 2024 11:15 AM Shanda MATSU wrote: Red Word that prompted transfer to Nurse Triage: Patient is calling back reporting pain in her feet and nothing being done as far as a prescription to ease pain. Reason for Disposition  [1] Caller requesting NON-URGENT health information AND [2] PCP's office is the best resource  Answer Assessment - Initial Assessment Questions Patient calling again stating that she was waiting Patient believes that she was told possibly Celexa for her feet pain and has not heard back from the office  This RN called [facility] to advise them that patient has called multiple times about this medication she was supposed to receive  Protocols used: Information Only Call - No Triage-A-AH

## 2024-05-29 NOTE — Telephone Encounter (Signed)
 Patient hung up while this RN was on the phone with [facility]---this RN called the patient back and patient states she will call us  back and hung up

## 2024-05-30 ENCOUNTER — Telehealth: Payer: Self-pay | Admitting: Family Medicine

## 2024-05-30 ENCOUNTER — Ambulatory Visit: Payer: Self-pay | Admitting: Family Medicine

## 2024-05-30 ENCOUNTER — Other Ambulatory Visit: Payer: Self-pay | Admitting: Family Medicine

## 2024-05-30 DIAGNOSIS — R87612 Low grade squamous intraepithelial lesion on cytologic smear of cervix (LGSIL): Secondary | ICD-10-CM

## 2024-05-30 DIAGNOSIS — R87618 Other abnormal cytological findings on specimens from cervix uteri: Secondary | ICD-10-CM

## 2024-05-30 NOTE — Progress Notes (Signed)
 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 Based on the American Society for Colposcopy and Cervical Pathology (ASCCP) guidelines, it is recommended to have a special procedure done by OBGYN specialist to visualize the cervix and biopsy it.

## 2024-05-30 NOTE — Telephone Encounter (Signed)
 Copied from CRM [id number]. Topic: Clinical - Medical Advice >> May 30, 2024 10:41 AM [name] wrote: Reason for CRM: Patient would like a call back from clinical team regarding medications and referral to general surgery.

## 2024-05-30 NOTE — Progress Notes (Signed)
 Left Vm to call office

## 2024-05-31 ENCOUNTER — Telehealth: Payer: Self-pay | Admitting: Family Medicine

## 2024-05-31 ENCOUNTER — Other Ambulatory Visit: Payer: Self-pay | Admitting: Family Medicine

## 2024-05-31 DIAGNOSIS — J9500 Unspecified tracheostomy complication: Secondary | ICD-10-CM

## 2024-05-31 DIAGNOSIS — M79671 Pain in right foot: Secondary | ICD-10-CM

## 2024-05-31 NOTE — Telephone Encounter (Signed)
 Copied from CRM [id number]. Topic: Clinical - Refused Triage >> May 31, 2024  9:39 AM Mia F wrote: Patient/caller voiced complaints of Pt called stating she is having a lot of pain and numbness in her feet. Upon asking what was going on with pt feet and asking if she was having pain she says what do you think?  And hung up. Declined transfer to triage.

## 2024-05-31 NOTE — Telephone Encounter (Signed)
 Copied from CRM [id number]. Topic: Clinical - Medical Advice >> May 30, 2024 10:41 AM [name] wrote: Reason for CRM: Patient would like a call back from clinical team regarding medications and referral to general surgery. >> May 31, 2024  9:45 AM Mia F wrote: PT called in upset saying she has been calling numerous times for her feet and throat. Could not clearly understand what the reason pt was calling as she was also talking to others around her. Pt did check the status of the referral for general surgery and that CRM was updated. Pt then mentioned medication for her feet. She states if Dr Ziglar does not respond to her today she will find another doctor. I asked pt if she is having pain to possibly get her with NT while she waits for a response from pcp. Pt responds what do you think dumb a-- and hung up.

## 2024-05-31 NOTE — Telephone Encounter (Signed)
 Advised that I sent a referral to podiatry for her.  Got disconnected twice during call

## 2024-05-31 NOTE — Telephone Encounter (Signed)
 Patient states that Gabapentin  is not helping her with foot pain and may need a referral to a Podiatrist. Patient has been notified that I am checking with referral coordinator regarding status updates. Patient verbalized understanding. All questions and concerns have been addressed.

## 2024-06-03 ENCOUNTER — Telehealth: Payer: Self-pay

## 2024-06-03 ENCOUNTER — Telehealth: Payer: Self-pay | Admitting: Family Medicine

## 2024-06-03 NOTE — Telephone Encounter (Signed)
 Returned callbut no answer and no VM set up.

## 2024-06-03 NOTE — Telephone Encounter (Signed)
 Patient was informed of pap results and requested that referral be sent to GYN for follow up. Also, patient states she is still experiencing hoarseness and would like to have something called into pharmacy.

## 2024-06-05 ENCOUNTER — Encounter: Payer: Self-pay | Admitting: Obstetrics & Gynecology

## 2024-06-07 ENCOUNTER — Other Ambulatory Visit: Payer: Self-pay | Admitting: Family Medicine

## 2024-06-07 ENCOUNTER — Telehealth: Payer: Self-pay | Admitting: *Deleted

## 2024-06-07 ENCOUNTER — Telehealth: Payer: Self-pay | Admitting: Family Medicine

## 2024-06-07 MED ORDER — CETIRIZINE HCL 10 MG PO TABS: 10.0000 mg | ORAL_TABLET | Freq: Every day | ORAL | 11 refills | Status: DC

## 2024-06-07 NOTE — Telephone Encounter (Signed)
 She has a hoarse voice.  It may be allergies because her nose is running all the time.  Will get her some zyrtec.  She is aching all over.  She is not smoking crack and maybe this is withdrawal from that.  She has a child psychotherapist that is trying to help her.  She is taking baclofen  and gabapentin  together and she is not hurting right now.

## 2024-06-07 NOTE — Patient Outreach (Signed)
 Phone call from patient requesting update on ACT team referral. Confirmed that referral was placed on Tuesday, 06/04/24 with Waynard Leavell JEANNENE [phone]. Contact information provided. Patient encouraged to contact 911 or present to the emergency room in the event of a medical emergency. Patient also encouraged to call 62 or present to Behavioral Health Urgent Care in the event she is experiencing a mental health crisis. This child psychotherapist will follow up with Waynard Leavell next week to ensure referral was received.   Vikas Wegmann, LCSW   [facility], [facility] Licensed Clinical Social Worker  Direct Dial: [phone]

## 2024-06-12 ENCOUNTER — Encounter: Payer: Self-pay | Admitting: *Deleted

## 2024-06-12 ENCOUNTER — Telehealth: Payer: Self-pay | Admitting: *Deleted

## 2024-06-12 ENCOUNTER — Ambulatory Visit: Admitting: Family Medicine

## 2024-06-12 ENCOUNTER — Telehealth: Payer: Self-pay | Admitting: Family Medicine

## 2024-06-12 NOTE — Patient Outreach (Signed)
 Phone call to [facility] Act team to confirm that they received the referral faxed.  The intake coordinator was not available. Left message requesting a return call. Reached out to patient for follow up, patient could not be reached.   Lovella Hardie, LCSW [place]  [facility], [facility] Health Licensed Clinical Social Worker  Direct Dial: [phone]

## 2024-06-12 NOTE — Patient Instructions (Signed)
 Andrea Sanford - I am sorry I was unable to reach you today for our scheduled appointment. I work with Ziglar, Susan K, MD and am calling to support your healthcare needs. Please contact me at [phone] at your earliest convenience. I look forward to speaking with you soon.   Thank you,    Laniya Friedl, LCSW [place]  [facility], [facility] Licensed Clinical Social Worker  Direct Dial: [phone]

## 2024-06-12 NOTE — Telephone Encounter (Signed)
 Copied from CRM [id number]. Topic: Referral - Question >> Jun 12, 2024  1:27 PM Sasha M wrote: Reason for CRM: Vertell from [facility] ENT called to inform Dr Ziglar that they will not be seeing this pt again. She was very rude to the staff so they would like Dr Brice to set up a referral to [facility] to take care of pt's trach

## 2024-06-13 ENCOUNTER — Telehealth: Payer: Self-pay | Admitting: *Deleted

## 2024-06-13 ENCOUNTER — Other Ambulatory Visit: Payer: Self-pay | Admitting: Family Medicine

## 2024-06-13 DIAGNOSIS — J9500 Unspecified tracheostomy complication: Secondary | ICD-10-CM

## 2024-06-13 NOTE — Patient Outreach (Signed)
 Phone call from patient stating that she has contacted [facility] [employer]. Per patient, they have not received her referral.  Patient states that Waynard Leavell will call her on Monday to complete intake. CSW will contact [facility] to assess for any additional documentation needs.   Macyn Remmert, LCSW [place]  [facility], [facility] Licensed Clinical Social Worker  Direct Dial: [phone]

## 2024-06-14 ENCOUNTER — Ambulatory Visit: Payer: Self-pay
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
## 2024-06-18 ENCOUNTER — Encounter: Payer: Self-pay | Admitting: *Deleted

## 2024-06-18 ENCOUNTER — Ambulatory Visit: Admitting: Family Medicine

## 2024-06-18 ENCOUNTER — Other Ambulatory Visit: Payer: Self-pay | Admitting: Family Medicine

## 2024-06-18 DIAGNOSIS — M62838 Other muscle spasm: Secondary | ICD-10-CM

## 2024-06-18 NOTE — Telephone Encounter (Signed)
 Copied from CRM #[id number]. Topic: Clinical - Medication Refill >> Jun 18, 2024  1:23 PM Pinkey ORN wrote: Medication: baclofen  (LIORESAL ) 10 MG tablet  Has the patient contacted their pharmacy? Yes (Agent: If no, request that the patient contact the pharmacy for the refill. If patient does not wish to contact the pharmacy document the reason why and proceed with request.) (Agent: If yes, when and what did the pharmacy advise?)  This is the patient's preferred pharmacy:  [facility] - [place], [place] - [address] [place] [address] Phone: [phone] Fax: [phone]  Is this the correct pharmacy for this prescription? Yes If no, delete pharmacy and type the correct one.   Has the prescription been filled recently? No  Is the patient out of the medication? No  Has the patient been seen for an appointment in the last year OR does the patient have an upcoming appointment? Yes  Can we respond through MyChart? No  Agent: Please be advised that Rx refills may take up to 3 business days. We ask that you follow-up with your pharmacy.

## 2024-06-18 NOTE — Patient Outreach (Signed)
 Phone call to [facility] ACT Team to follow up on referral placed. Voice mail left requesting a return call.   Linton Stolp, LCSW [place]  [facility], [facility] Licensed Clinical Social Worker  Direct Dial: [phone]

## 2024-06-19 ENCOUNTER — Ambulatory Visit: Admitting: Family Medicine

## 2024-06-19 MED ORDER — BACLOFEN 10 MG PO TABS: 10.0000 mg | ORAL_TABLET | Freq: Three times a day (TID) | ORAL | 0 refills | Status: DC

## 2024-06-19 NOTE — Telephone Encounter (Signed)
 Please advise if refill is appropriate

## 2024-06-20 ENCOUNTER — Telehealth: Payer: Self-pay | Admitting: *Deleted

## 2024-06-20 NOTE — Patient Outreach (Signed)
 Collaboration phone call to [employer] on 06/19/24 to confirm that referral was received. Spoke with Tara Redo who confirmed that she has spoken to patient and has scheduled a in home assessment for 06/24/24 at 11am. Confirmed that referral had not been receive. CSW to send referral through secure email [email or web address].   Manmeet Arzola, LCSW [place]  [facility], [facility] Licensed Clinical Social Worker  Direct Dial: [phone]

## 2024-06-24 ENCOUNTER — Ambulatory Visit: Admitting: Family Medicine

## 2024-06-25 ENCOUNTER — Ambulatory Visit: Admitting: Family Medicine

## 2024-06-26 ENCOUNTER — Ambulatory Visit: Payer: Self-pay

## 2024-06-26 NOTE — Telephone Encounter (Signed)
 Attempted to contact patient x 1 to discuss symptoms; Patient answered the phone but stated she was busy and would call us  back later. RN will place in callbacks to touch base with patient in case she does not return call.         Copied from CRM #[id number]. Topic: Clinical - Red Word Triage >> Jun 26, 2024 10:07 AM Viola FALCON wrote: Red Word that prompted transfer to Nurse Triage: Patient having pain in neck and throat >> Jun 26, 2024 10:09 AM Viola F wrote: Pain in legs/joints - patient disconnected phone call, please call back

## 2024-06-27 ENCOUNTER — Ambulatory Visit: Payer: Self-pay

## 2024-06-27 NOTE — Telephone Encounter (Signed)
  FYI Only or Action Required?: FYI only for provider: appointment scheduled on 06/28/24.  Patient was last seen in primary care on 05/21/2024 by Ziglar, Susan K, MD.  Called Nurse Triage reporting Spasms and Toe Pain.  Symptoms began several months ago.  Interventions attempted: Rest, hydration, or home remedies.  Symptoms are: unchanged.  Triage Disposition: See PCP Within 2 Weeks  Patient/caregiver understands and will follow disposition?: Yes   Copied from CRM [id number]. Topic: Clinical - Red Word Triage >> Jun 27, 2024  3:32 PM Tiffini S wrote: Red Word that prompted transfer to Nurse Triage: Patient is calling to talk with nurse- pain in neck with muscle spasms/ broken toe Reason for Disposition  Muscle jerks, tics, or shudders are a chronic symptom (recurrent or ongoing AND present > 4 weeks)  Answer Assessment - Initial Assessment Questions Wonder if she can be prescribed a walker with seat, has toe pain causing problems with ambulation.   History of trach, site did not heal properly followed by ENT. Thinks this may have started the neck spasms in April. Requesting baclofen  for neck and other site spasms.   Acute appointment with pcp scheduled 06/28/24, patient stated she normally needs 48 hour notice due to transport but feels her mom can drive her in the morning, if not able to come in for appointment she will call back today to reschedule.    1. APPEARANCE of MOVEMENT: What did the jerking or twitching look like? (e.g., body area)     Spasms-neck, legs, arms 2. ONSET: When did this start happening? (e.g., hours, days, weeks, months ago)     Chronic 3. DURATION: How long does the jerk, twitch, or spasm last?     Varies  4. FREQUENCY:  How often does this happen?      daily 5. WHEN: When does this happen? (e.g., while awake, while falling asleep, while sleeping)     Random-no cause identified  6. CAUSE: What do you think caused the spasms?      Unsure-chronic 7. OTHER SYMPTOMS: Are there any other symptoms? (e.g., fever, headache)     Toe pain 8. PREGNANCY: Is there any chance you are pregnant? When was your last menstrual period?  Protocols used: Muscle Jerks - Tics - [facility]

## 2024-06-28 ENCOUNTER — Encounter: Payer: Self-pay | Admitting: Family Medicine

## 2024-06-28 ENCOUNTER — Ambulatory Visit: Payer: MEDICAID | Admitting: Family Medicine

## 2024-06-28 VITALS — BP 131/84 | HR 69 | Resp 16 | Ht 66.0 in

## 2024-06-28 DIAGNOSIS — R8789 Other abnormal findings in specimens from female genital organs: Secondary | ICD-10-CM

## 2024-06-28 DIAGNOSIS — M1712 Unilateral primary osteoarthritis, left knee: Secondary | ICD-10-CM

## 2024-06-28 DIAGNOSIS — J0100 Acute maxillary sinusitis, unspecified: Secondary | ICD-10-CM

## 2024-06-28 DIAGNOSIS — M5441 Lumbago with sciatica, right side: Secondary | ICD-10-CM

## 2024-06-28 DIAGNOSIS — M5442 Lumbago with sciatica, left side: Secondary | ICD-10-CM

## 2024-06-28 DIAGNOSIS — G8929 Other chronic pain: Secondary | ICD-10-CM

## 2024-06-28 DIAGNOSIS — R87618 Other abnormal cytological findings on specimens from cervix uteri: Secondary | ICD-10-CM

## 2024-06-28 DIAGNOSIS — S139XXS Sprain of joints and ligaments of unspecified parts of neck, sequela: Secondary | ICD-10-CM

## 2024-06-28 DIAGNOSIS — F419 Anxiety disorder, unspecified: Secondary | ICD-10-CM

## 2024-06-28 MED ORDER — MELOXICAM 15 MG PO TABS: 15.0000 mg | ORAL_TABLET | Freq: Every day | ORAL | 1 refills | Status: DC

## 2024-06-28 MED ORDER — DOXYCYCLINE HYCLATE 100 MG PO CAPS: 100.0000 mg | ORAL_CAPSULE | Freq: Two times a day (BID) | ORAL | 0 refills | Status: DC

## 2024-06-28 MED ORDER — LIDOCAINE 5 % EX PTCH: 1.0000 | MEDICATED_PATCH | CUTANEOUS | 5 refills | Status: DC
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
## 2024-06-30 DIAGNOSIS — R8789 Other abnormal findings in specimens from female genital organs: Secondary | ICD-10-CM | POA: Insufficient documentation

## 2024-06-30 NOTE — Assessment & Plan Note (Signed)
 Reports that her neck hurts all the time.  Baclofen  10 mg 3 times daily helps some.  Wants to go to a chiropractor.  Will refer.

## 2024-06-30 NOTE — Assessment & Plan Note (Signed)
 Has a high risk HPV and LGSIL.  Referral to GYN for further evaluation.

## 2024-07-01 ENCOUNTER — Telehealth: Payer: Self-pay | Admitting: Family Medicine

## 2024-07-01 NOTE — Telephone Encounter (Signed)
 Copied from CRM #[id number]. Topic: Clinical - Prescription Issue >> Jul 01, 2024  8:27 AM Berneda FALCON wrote: Reason for CRM: Patient states that the medications were sent to the wrong pharmacy during her visit on 12/5 with Dr. Ziglar. They were sent to [facility] [address] I did update the pharmacy in her chart and remove the old pharmacy to help avoid future confusion.  Can we please [facility] her?  Additionally, she is requesting refills for her -gabapentin  (NEURONTIN ) 300 MG capsule -QUEtiapine  (SEROQUEL ) 400 MG tablet

## 2024-07-01 NOTE — Telephone Encounter (Signed)
 Copied from CRM [id number]. Topic: Clinical - Order For Equipment >> Jul 01, 2024  8:30 AM Berneda FALCON wrote: Reason for CRM: Patient is asking if the PCP sent in the request for a walker for her? She would like a callback at  [phone] please.

## 2024-07-01 NOTE — Telephone Encounter (Signed)
 Ordered roller today. Attempted to call and call not going through at this time.

## 2024-07-02 ENCOUNTER — Ambulatory Visit: Payer: Self-pay

## 2024-07-02 ENCOUNTER — Encounter: Payer: Self-pay | Admitting: Obstetrics & Gynecology

## 2024-07-02 MED ORDER — LIDOCAINE 5 % EX PTCH: 1.0000 | MEDICATED_PATCH | CUTANEOUS | 5 refills | Status: DC

## 2024-07-02 MED ORDER — MELOXICAM 15 MG PO TABS: 15.0000 mg | ORAL_TABLET | Freq: Every day | ORAL | 1 refills | Status: AC

## 2024-07-02 MED ORDER — DOXYCYCLINE HYCLATE 100 MG PO CAPS: 100.0000 mg | ORAL_CAPSULE | Freq: Two times a day (BID) | ORAL | 0 refills | Status: DC

## 2024-07-02 MED ORDER — QUETIAPINE FUMARATE 400 MG PO TABS: 400.0000 mg | ORAL_TABLET | Freq: Every day | ORAL | 1 refills | Status: DC

## 2024-07-02 MED ORDER — GABAPENTIN 300 MG PO CAPS: 600.0000 mg | ORAL_CAPSULE | Freq: Three times a day (TID) | ORAL | 1 refills | Status: DC

## 2024-07-02 NOTE — Addendum Note (Signed)
 Addended by: Juneau Doughman K on: 07/02/2024 01:24 PM   Modules accepted: Orders

## 2024-07-02 NOTE — Telephone Encounter (Signed)
 Tried the number and phone would not ring, calling to let pt know that her medication has been sent to pharmacy and also to let her know that the company tried to reach her about the walker, looks like they are looking to ship it but will not do anything until they talk to her.   Not sure if something is wrong with her phone or if the number in the chart is wrong but I could not get through and VM was not an option.

## 2024-07-02 NOTE — Telephone Encounter (Signed)
 FYI Only or Action Required?: Action required by provider: update on patient condition and requesting meds.  Patient was last seen in primary care on 06/28/2024 by Ziglar, Susan K, MD.  Called Nurse Triage reporting Medication Problem.  Symptoms began several days ago.  Interventions attempted: Other: na.  Symptoms are: gradually worsening.  Triage Disposition: No disposition on file.  Patient/caregiver understands and will follow disposition?:  Copied from CRM #[id number]. Topic: Clinical - Red Word Triage >> Jul 02, 2024 11:52 AM [name] wrote: Red Word that prompted transfer to Nurse Triage: Patient is attempting to get medication refill, states she is in pain Answer Assessment - Initial Assessment Questions 1. REASON FOR CALL: What is the main reason for your call? or How can I best help you?     Patient calling upset that her meds Friday were not sent to the right Pharmacy and Apothecary will not fill them for her. She is also in need of Gabapentin  and Seroquel  refills. Please see refill encounter 12/8.   Asking about the walker and where it was ordered to. She states she needs it bad.   Pain consistent with chronic pain, no new or concerning symptoms. Just needs her meds  Protocols used: Information Only Call - No Triage-A-AH

## 2024-07-02 NOTE — Congregational Nurse Program (Signed)
  Dept: [phone]   Congregational Nurse Program Note  Date of Encounter: 07/02/2024 Client to [facility] center, nurse led clinic, with request for assistance in obtaining her medications ordered by her PCP at a visit yesterday. Client was a bit confusing as to where her prescriptions were. It was clarified that they had been sent to [facility] , however [facility]. Client reported that [facility], but was told the medications were not there. RN assisted [facility] to [facility] this morning, however she did not want to leave a message at the pharmacy and decided she would just go over there. Client also has what appears to be a broken great right toe. The toe was red, swollen and painful with a discolored area to outer side. She stated she had been prescribed medications for this yesterday at the PCP visit. At client request RN placed a soft padded adhesive strip around the toe to provide padding. Client appreciate of assistance provided. MARLA Marina BSN, RN Past Medical History: Past Medical History:  Diagnosis Date   Allergy    Anxiety    Arthritis    Asthma    Bipolar 1 disorder (HCC)    Cancer (HCC)    COPD (chronic obstructive pulmonary disease) (HCC)    Depression    Diabetes mellitus without complication (HCC)    Drug abuse (HCC)    Hypertension    Neuromuscular disorder (HCC)    Osteoporosis    PTSD (post-traumatic stress disorder) 04/07/2024   Sleep apnea     Encounter Details:  Community Questionnaire - 07/02/24 1115       Questionnaire   Ask client: Do you give verbal consent for me to treat you today? Yes    Student Assistance N/A    Location Patient Served  [facility]    Encounter Setting CN site    Population Status Unhoused    Insurance Medicaid    Insurance/Financial Assistance Referral N/A    Medication Have Medication Insecurities    Medical Provider Yes    Screening Referrals Made N/A     Medical Referrals Made N/A    Medical Appointment Completed N/A    CNP Interventions Advocate/Support;Educate;Navigate Healthcare System;Case Management    Screenings CN Performed N/A    ED Visit Averted N/A    Life-Saving Intervention Made N/A

## 2024-07-02 NOTE — Telephone Encounter (Signed)
  Phone will not eeven ring when we call patient. She needs to fix her phone and answer calls as I do not have a contact for the company trying to reach her, They note that they are trying to deliver but can not reach by phone and they note this on the portal we use for equiopment orders but her insurance may know as they approved the walker.   Copied from CRM #[id number]. Topic: Clinical - Prescription Issue >> Jul 02, 2024  4:43 PM [name] wrote: Pt was calling top follow up with her med refill request which has been sent to the pharmacy, she has also been informed that the company who will be providing her walk er has been trying to get in contact with her. [facility] has also been trying to get in contact with the pt. I gave the pt the [facility] phone number but did not have th walker company phone number to provide nor the name. Office front  stated they will reach out to the nurse. I have also corrected the pts phone number as it was one digit off.

## 2024-07-03 ENCOUNTER — Encounter: Payer: Self-pay | Admitting: Obstetrics & Gynecology

## 2024-07-04 ENCOUNTER — Other Ambulatory Visit: Payer: Self-pay | Admitting: Family Medicine

## 2024-07-04 DIAGNOSIS — M62838 Other muscle spasm: Secondary | ICD-10-CM

## 2024-07-04 MED ORDER — BACLOFEN 10 MG PO TABS: 10.0000 mg | ORAL_TABLET | Freq: Three times a day (TID) | ORAL | 0 refills | Status: DC

## 2024-07-05 ENCOUNTER — Other Ambulatory Visit (HOSPITAL_COMMUNITY): Payer: Self-pay

## 2024-07-05 ENCOUNTER — Telehealth: Payer: Self-pay | Admitting: Pharmacy Technician

## 2024-07-05 NOTE — Telephone Encounter (Signed)
 Pharmacy Patient Advocate Encounter   Received notification from Onbase that prior authorization for Lidocaine  5% patches is required/requested.   Insurance verification completed.   The patient is insured through [employer] [place] [employer].   Per test claim: PA required; PA submitted to above mentioned insurance via Latent Key/confirmation #/EOC [id number] Status is pending

## 2024-07-08 ENCOUNTER — Other Ambulatory Visit (HOSPITAL_COMMUNITY): Payer: Self-pay

## 2024-07-08 ENCOUNTER — Telehealth: Payer: Self-pay | Admitting: Family Medicine

## 2024-07-08 NOTE — Telephone Encounter (Signed)
 Copied from CRM #[id number]. Topic: Clinical - Prescription Issue >> Jul 08, 2024 11:27 AM Ivette P wrote: Reason for CRM: Pt called in about Rolator, lidocaine  patches and also gabapentin .   Pt states gabapentin  was at 800mg  and was reduced and would like to know why  gabapentin  (NEURONTIN ) 300 MG capsule   [phone] - callback

## 2024-07-08 NOTE — Telephone Encounter (Signed)
 Pharmacy Patient Advocate Encounter  Received notification from [employer] [place] [employer] that Prior Authorization for Lidocaine  5% patches has been APPROVED from 07/08/2024 to 07/04/2025. Ran test claim, Copay is $4.00. This test claim was processed through [facility]- copay amounts may vary at other pharmacies due to pharmacy/plan contracts, or as the patient moves through the different stages of their insurance plan.   PA #/Case ID/Reference #: [id number]

## 2024-07-09 NOTE — Congregational Nurse Program (Signed)
°  Dept: [phone]   Congregational Nurse Program Note  Date of Encounter: 07/09/2024 Client to [facility] center with some what confusing details regarding her medications. RN reviewed the chart notes. Client expressed concern over a different dose of gabapentin  and was also concerned that her lidocaine  patches were not ready. RN explained that they had required prior authorization and it appeared that they had been approved. Client insisted that she did not have her copay for the 2 medications. RN offered to have the prescriptions transferred to the [facility] pharmacy, where the copays would be waived, RN offered to pick up the medications and bring them to the center tomorrow, however client declined and requested LINK bus passes so she [facility] her [facility] [place] and [facility] the medications. LINK bus passes given. MARLA Marina BSN, RN Past Medical History: Past Medical History:  Diagnosis Date   Allergy    Anxiety    Arthritis    Asthma    Bipolar 1 disorder (HCC)    Cancer (HCC)    COPD (chronic obstructive pulmonary disease) (HCC)    Depression    Diabetes mellitus without complication (HCC)    Drug abuse (HCC)    Hypertension    Neuromuscular disorder (HCC)    Osteoporosis    PTSD (post-traumatic stress disorder) 04/07/2024   Sleep apnea     Encounter Details:  Community Questionnaire - 07/09/24 0950       Questionnaire   Ask client: Do you give verbal consent for me to treat you today? Yes    Student Assistance N/A    Location Patient Served  [facility]    Encounter Setting CN site    Population Status Unhoused    Insurance Medicaid    Insurance/Financial Assistance Referral N/A    Medication Have Medication Insecurities    Medical Provider Yes    Screening Referrals Made N/A    Medical Referrals Made N/A    Medical Appointment Completed N/A    CNP Interventions Advocate/Support;Educate    Screenings CN Performed N/A     ED Visit Averted N/A    Life-Saving Intervention Made N/A

## 2024-07-10 ENCOUNTER — Telehealth: Payer: Self-pay

## 2024-07-10 ENCOUNTER — Other Ambulatory Visit: Payer: Self-pay

## 2024-07-10 DIAGNOSIS — F419 Anxiety disorder, unspecified: Secondary | ICD-10-CM

## 2024-07-10 NOTE — Telephone Encounter (Signed)
 Patient came by asking about Rollator. I changed order to [employer] as [employer] could not deliver . Also placed order for community social work as patient is now homeless.  Gave RX for Rollator incase she can get to [facility] store faster. Awaiting insurance and word from [employer] via [email or web address].

## 2024-07-10 NOTE — Congregational Nurse Program (Signed)
°  Dept: [phone]   Congregational Nurse Program Note  Date of Encounter: 07/10/2024 Client to [facility] center , nurse led clinic with request for foot care. Right great toe continues to be tender and somewhat discolored with minor swelling. Nail remains intact. Callous to the outer base of the great toe gently filed. Moisturizer applied to both feet. Emotional support given. MARLA Marina BSN , RN Past Medical History: Past Medical History:  Diagnosis Date   Allergy    Anxiety    Arthritis    Asthma    Bipolar 1 disorder (HCC)    Cancer (HCC)    COPD (chronic obstructive pulmonary disease) (HCC)    Depression    Diabetes mellitus without complication (HCC)    Drug abuse (HCC)    Hypertension    Neuromuscular disorder (HCC)    Osteoporosis    PTSD (post-traumatic stress disorder) 04/07/2024   Sleep apnea     Encounter Details:  Community Questionnaire - 07/10/24 1320       Questionnaire   Ask client: Do you give verbal consent for me to treat you today? Yes    Student Assistance N/A    Location Patient Served  [facility]    Encounter Setting CN site    Population Status Unhoused    Insurance Medicaid    Insurance/Financial Assistance Referral N/A    Medication Have Medication Insecurities    Medical Provider Yes    Screening Referrals Made N/A    Medical Referrals Made N/A    Medical Appointment Completed N/A    CNP Interventions Advocate/Support;Educate    Screenings CN Performed N/A    ED Visit Averted N/A    Life-Saving Intervention Made N/A

## 2024-07-11 ENCOUNTER — Telehealth: Payer: Self-pay | Admitting: *Deleted

## 2024-07-12 ENCOUNTER — Telehealth: Payer: Self-pay | Admitting: *Deleted

## 2024-07-12 ENCOUNTER — Telehealth: Payer: Self-pay | Admitting: Family Medicine

## 2024-07-12 NOTE — Telephone Encounter (Signed)
 Copied from CRM #[id number]. Topic: Clinical - Prescription Issue >> Jul 12, 2024 10:56 AM Rosina BIRCH wrote: Reason for CRM: patient called stating the pharmacy told her she need prior authorization for her QUEtiapine   [phone]

## 2024-07-15 ENCOUNTER — Other Ambulatory Visit (HOSPITAL_COMMUNITY): Payer: Self-pay

## 2024-07-15 ENCOUNTER — Telehealth: Payer: Self-pay

## 2024-07-15 NOTE — Patient Instructions (Signed)
 Visit Information  Thank you for taking time to visit with me today. Please don't hesitate to contact me if I can be of assistance to you before our next scheduled appointment.  Your next care management appointment is by telephone on 08/02/24 at 3pm    Please call the care guide team at [phone] if you need to cancel, schedule, or reschedule an appointment.   Please call the Suicide and Crisis Lifeline: 988 call the [place]  National Suicide Prevention Lifeline: (774) 790-9603 or TTY: 414-044-1426 TTY 218 364 4834) to talk to a trained counselor call 1-800-273-TALK (toll free, 24 hour hotline) call 911 if you are experiencing a Mental Health or Behavioral Health Crisis or need someone to talk to.  Siler Mavis, LCSW [place]  [facility], [facility] Licensed Clinical Social Worker  Direct Dial: [phone]

## 2024-07-15 NOTE — Patient Outreach (Signed)
 Complex Care Management   Visit Note  07/15/2024  Name:  Andrea Sanford MRN: [id number] DOB: 1970-08-01  Situation: Referral received for Complex Care Management related to  Patient is homeless, has challenges with drug use and consistent mental health follow up     I obtained verbal consent from Patient.  Visit completed with Patient  on the phone on 07/12/24  Background:   Past Medical History:  Diagnosis Date   Allergy    Anxiety    Arthritis    Asthma    Bipolar 1 disorder (HCC)    Cancer (HCC)    COPD (chronic obstructive pulmonary disease) (HCC)    Depression    Diabetes mellitus without complication (HCC)    Drug abuse (HCC)    Hypertension    Neuromuscular disorder (HCC)    Osteoporosis    PTSD (post-traumatic stress disorder) 04/07/2024   Sleep apnea     Assessment: Patient remains interested in referral to [facility] for close mental health follow up in the community.Collaboration phone call to [facility] to confirm date and time of appointment, they will call this social worker back on Monday to confirm date and time Patient Reported Symptoms:  Cognitive Cognitive Status: Able to follow simple commands, Difficulties with attention and concentration, Poor judgment in daily scenarios Cognitive/Intellectual Conditions Management [RPT]: None reported or documented in medical history or problem list   Health Maintenance Behaviors: Annual physical exam Healing Pattern: Slow Health Facilitated by: Stress management  Neurological Neurological Review of Symptoms: No symptoms reported    HEENT HEENT Symptoms Reported: Other: (unchanged)      Cardiovascular Cardiovascular Symptoms Reported: No symptoms reported    Respiratory Respiratory Symptoms Reported: No symptoms reported    Endocrine      Gastrointestinal Gastrointestinal Symptoms Reported: Abdominal pain or discomfort Additional Gastrointestinal Details: belly pain states that she is high risk for uterine cancer,  has to have a procedure-Colposcopy 08/06/24 Gastrointestinal Management Strategies: Adequate rest, Medication therapy    Genitourinary Genitourinary Symptoms Reported: No symptoms reported    Integumentary      Musculoskeletal Musculoskelatal Symptoms Reviewed: Difficulty walking, Limited mobility Additional Musculoskeletal Details: shoulder pain Musculoskeletal Management Strategies: Medication therapy      Psychosocial Psychosocial Symptoms Reported: Depression - if selected complete PHQ 2-9 Additional Psychological Details: medication managed by primary-referral made to [facility]- community-based mental health service providing comprehensive support for individuals with severe and persistent mental illnesses-patient missed iniital appointment 06/24/24 now changed to January Behavioral Management Strategies: Medication therapy Major Change/Loss/Stressor/Fears (CP): Resources Techniques to [employer] with Loss/Stress/Change: Substance use, Medication Quality of Family Relationships: non-existent    07/15/2024    PHQ2-9 Depression Screening   Little interest or pleasure in doing things    Feeling down, depressed, or hopeless    PHQ-2 - Total Score    Trouble falling or staying asleep, or sleeping too much    Feeling tired or having little energy    Poor appetite or overeating     Feeling bad about yourself - or that you are a failure or have let yourself or your family down    Trouble concentrating on things, such as reading the newspaper or watching television    Moving or speaking so slowly that other people could have noticed.  Or the opposite - being so fidgety or restless that you have been moving around a lot more than usual    Thoughts that you would be better off dead, or hurting yourself in some way  PHQ2-9 Total Score    If you checked off any problems, how difficult have these problems made it for you to do your work, take care of things at home, or get along with other  people    Depression Interventions/Treatment      There were no vitals filed for this visit.    Medications Reviewed Today     Reviewed by Ermalinda Lenn HERO, LCSW (Social Worker) on 07/12/24 at 1634  Med List Status: <None>   Medication Order Taking? Sig Documenting Provider Last Dose Status Informant  ACCU-CHEK GUIDE TEST test strip [id number]  1 each by Other route 2 (two) times daily. Ziglar, Susan K, MD  Active   albuterol  (VENTOLIN  HFA) 108 [phone] Base) MCG/ACT inhaler [id number] Yes Inhale 2 puffs into the lungs every 6 (six) hours as needed for wheezing. Ziglar, Susan K, MD  Active   amLODipine  (NORVASC ) 5 MG tablet [id number] Yes Take 1 tablet (5 mg total) by mouth daily. Ziglar, Susan K, MD  Active   Aspirin-Salicylamide-Caffeine  (ARTHRITIS STRENGTH BC POWDER PO) [id number] Yes Take 3 packets by mouth every 6 (six) hours as needed (For pain). [provider]  Active Multiple Informants  atorvastatin  (LIPITOR) 40 MG tablet [id number] Yes Take 1 tablet (40 mg total) by mouth daily. Ziglar, Susan K, MD  Active   baclofen  (LIORESAL ) 10 MG tablet [id number] Yes Take 1 tablet (10 mg total) by mouth 3 (three) times daily. Ziglar, Susan K, MD  Active   cetirizine  (ZYRTEC ) 10 MG tablet [id number] Yes Take 1 tablet (10 mg total) by mouth daily. Ziglar, Susan K, MD  Active   doxycycline  (VIBRAMYCIN ) 100 MG capsule [id number] Yes Take 1 capsule (100 mg total) by mouth 2 (two) times daily. Ziglar, Susan K, MD  Active   gabapentin  (NEURONTIN ) 300 MG capsule [id number] Yes Take 2 capsules (600 mg total) by mouth 3 (three) times daily. Ziglar, Susan K, MD  Active   lactulose  (CHRONULAC ) 10 GM/15ML solution [id number] Yes Take 15 mLs (10 g total) by mouth 2 (two) times daily. Leigh Corean Massa, MD  Active   lidocaine  (LIDODERM ) 5 % [id number] Yes Place 1 patch onto the skin daily. Remove & Discard patch within 12 hours or as directed by MD Ziglar, Susan K, MD  Active   meloxicam  (MOBIC ) 15 MG tablet  [id number] Yes Take 1 tablet (15 mg total) by mouth daily. Ziglar, Susan K, MD  Active   montelukast  (SINGULAIR ) 10 MG tablet [id number] Yes Take 1 tablet (10 mg total) by mouth at bedtime. Ziglar, Susan K, MD  Active   nicotine  (NICODERM CQ  - DOSED IN MG/24 HOURS) 21 mg/24hr patch [id number]  Place 1 patch (21 mg total) onto the skin daily.  Patient not taking: Reported on 07/12/2024   Leigh Corean Massa, MD  Active   predniSONE  [facility] UNI-PAK 48 TAB) 10 MG (48) TBPK tablet [id number]  Take by mouth daily. 12-day taper pack, use as directed for taper  Patient not taking: Reported on 07/12/2024   Ziglar, Susan K, MD  Active   QUEtiapine  (SEROQUEL ) 400 MG tablet [id number] Yes Take 1 tablet (400 mg total) by mouth at bedtime. Ziglar, Susan K, MD  Active             Recommendation:   PCP Follow-up Continue Current Plan of Care Patient referred to [facility]   Follow Up Plan:   Telephone follow up appointment date/time:  08/02/24 3pm  Moses Odoherty, LCSW [place]  [facility], Population  Health Licensed Clinical Merchandiser, Retail Dial: [phone]

## 2024-07-15 NOTE — Telephone Encounter (Signed)
 Pharmacy Patient Advocate Encounter   Received notification from Physician's Office that prior authorization for Quetiapine  400 mg tablet is required/requested.   Insurance verification completed.   The patient is insured through [employer] [place] [employer].   Per test claim: The current 30 day co-pay is, $4.  No PA needed at this time. This test claim was processed through [facility]- copay amounts may vary at other pharmacies due to pharmacy/plan contracts, or as the patient moves through the different stages of their insurance plan.

## 2024-07-29 ENCOUNTER — Encounter: Payer: Self-pay | Admitting: *Deleted

## 2024-07-29 NOTE — Patient Outreach (Addendum)
 Phone call from [facility] confirming patient's appointment for 07/30/24 at 2pm.  Lenn Mean, LCSW [place]  [facility], [facility] Licensed Clinical Social Worker  Direct Dial: [phone]

## 2024-07-30 ENCOUNTER — Inpatient Hospital Stay
Admission: EM | Admit: 2024-07-30 | Discharge: 2024-08-08 | DRG: 917 | Payer: MEDICAID | Attending: Internal Medicine | Admitting: Internal Medicine

## 2024-07-30 ENCOUNTER — Telehealth: Payer: Self-pay | Admitting: *Deleted

## 2024-07-30 ENCOUNTER — Emergency Department: Payer: MEDICAID

## 2024-07-30 ENCOUNTER — Inpatient Hospital Stay: Payer: MEDICAID

## 2024-07-30 DIAGNOSIS — F411 Generalized anxiety disorder: Secondary | ICD-10-CM | POA: Diagnosis present

## 2024-07-30 DIAGNOSIS — Z818 Family history of other mental and behavioral disorders: Secondary | ICD-10-CM

## 2024-07-30 DIAGNOSIS — T50902A Poisoning by unspecified drugs, medicaments and biological substances, intentional self-harm, initial encounter: Secondary | ICD-10-CM | POA: Diagnosis not present

## 2024-07-30 DIAGNOSIS — F319 Bipolar disorder, unspecified: Secondary | ICD-10-CM | POA: Diagnosis present

## 2024-07-30 DIAGNOSIS — E876 Hypokalemia: Secondary | ICD-10-CM | POA: Diagnosis present

## 2024-07-30 DIAGNOSIS — F14129 Cocaine abuse with intoxication, unspecified: Secondary | ICD-10-CM | POA: Diagnosis present

## 2024-07-30 DIAGNOSIS — M810 Age-related osteoporosis without current pathological fracture: Secondary | ICD-10-CM | POA: Diagnosis present

## 2024-07-30 DIAGNOSIS — J9602 Acute respiratory failure with hypercapnia: Secondary | ICD-10-CM | POA: Diagnosis present

## 2024-07-30 DIAGNOSIS — R001 Bradycardia, unspecified: Secondary | ICD-10-CM | POA: Diagnosis not present

## 2024-07-30 DIAGNOSIS — R54 Age-related physical debility: Secondary | ICD-10-CM | POA: Diagnosis present

## 2024-07-30 DIAGNOSIS — Z791 Long term (current) use of non-steroidal anti-inflammatories (NSAID): Secondary | ICD-10-CM

## 2024-07-30 DIAGNOSIS — F4310 Post-traumatic stress disorder, unspecified: Secondary | ICD-10-CM | POA: Diagnosis present

## 2024-07-30 DIAGNOSIS — R4189 Other symptoms and signs involving cognitive functions and awareness: Principal | ICD-10-CM

## 2024-07-30 DIAGNOSIS — I10 Essential (primary) hypertension: Secondary | ICD-10-CM | POA: Diagnosis present

## 2024-07-30 DIAGNOSIS — G9341 Metabolic encephalopathy: Secondary | ICD-10-CM

## 2024-07-30 DIAGNOSIS — F1011 Alcohol abuse, in remission: Secondary | ICD-10-CM | POA: Diagnosis present

## 2024-07-30 DIAGNOSIS — J4489 Other specified chronic obstructive pulmonary disease: Secondary | ICD-10-CM | POA: Diagnosis present

## 2024-07-30 DIAGNOSIS — Z1152 Encounter for screening for COVID-19: Secondary | ICD-10-CM

## 2024-07-30 DIAGNOSIS — Z833 Family history of diabetes mellitus: Secondary | ICD-10-CM

## 2024-07-30 DIAGNOSIS — G4730 Sleep apnea, unspecified: Secondary | ICD-10-CM | POA: Diagnosis present

## 2024-07-30 DIAGNOSIS — T428X2A Poisoning by antiparkinsonism drugs and other central muscle-tone depressants, intentional self-harm, initial encounter: Secondary | ICD-10-CM | POA: Diagnosis present

## 2024-07-30 DIAGNOSIS — R4182 Altered mental status, unspecified: Secondary | ICD-10-CM | POA: Diagnosis not present

## 2024-07-30 DIAGNOSIS — G928 Other toxic encephalopathy: Secondary | ICD-10-CM | POA: Diagnosis present

## 2024-07-30 DIAGNOSIS — E119 Type 2 diabetes mellitus without complications: Secondary | ICD-10-CM | POA: Diagnosis present

## 2024-07-30 DIAGNOSIS — F17210 Nicotine dependence, cigarettes, uncomplicated: Secondary | ICD-10-CM | POA: Diagnosis present

## 2024-07-30 DIAGNOSIS — R569 Unspecified convulsions: Secondary | ICD-10-CM | POA: Diagnosis not present

## 2024-07-30 DIAGNOSIS — M1990 Unspecified osteoarthritis, unspecified site: Secondary | ICD-10-CM | POA: Diagnosis present

## 2024-07-30 DIAGNOSIS — T40712A Poisoning by cannabis, intentional self-harm, initial encounter: Secondary | ICD-10-CM | POA: Diagnosis present

## 2024-07-30 DIAGNOSIS — J9601 Acute respiratory failure with hypoxia: Secondary | ICD-10-CM | POA: Diagnosis present

## 2024-07-30 DIAGNOSIS — Z79899 Other long term (current) drug therapy: Secondary | ICD-10-CM

## 2024-07-30 DIAGNOSIS — T68XXXA Hypothermia, initial encounter: Secondary | ICD-10-CM

## 2024-07-30 DIAGNOSIS — R404 Transient alteration of awareness: Secondary | ICD-10-CM | POA: Diagnosis present

## 2024-07-30 DIAGNOSIS — E782 Mixed hyperlipidemia: Secondary | ICD-10-CM | POA: Diagnosis present

## 2024-07-30 DIAGNOSIS — T405X2A Poisoning by cocaine, intentional self-harm, initial encounter: Principal | ICD-10-CM | POA: Diagnosis present

## 2024-07-30 DIAGNOSIS — J9600 Acute respiratory failure, unspecified whether with hypoxia or hypercapnia: Secondary | ICD-10-CM

## 2024-07-30 LAB — COMPREHENSIVE METABOLIC PANEL WITH GFR
ALT: 11 U/L (ref 0–44)
AST: 20 U/L (ref 15–41)
Albumin: 4.3 g/dL (ref 3.5–5.0)
Alkaline Phosphatase: 110 U/L (ref 38–126)
Anion gap: 12 (ref 5–15)
BUN: 23 mg/dL — ABNORMAL HIGH (ref 6–20)
CO2: 27 mmol/L (ref 22–32)
Calcium: 9.2 mg/dL (ref 8.9–10.3)
Chloride: 106 mmol/L (ref 98–111)
Creatinine, Ser: 0.72 mg/dL (ref 0.44–1.00)
GFR, Estimated: >60 mL/min
Glucose, Bld: 139 mg/dL — ABNORMAL HIGH (ref 70–99)
Potassium: 3.1 mmol/L — ABNORMAL LOW (ref 3.5–5.1)
Sodium: 145 mmol/L (ref 135–145)
Total Bilirubin: 0.6 mg/dL (ref 0.0–1.2)
Total Protein: 6.7 g/dL (ref 6.5–8.1)

## 2024-07-30 LAB — URINALYSIS, W/ REFLEX TO CULTURE (INFECTION SUSPECTED)
Bilirubin Urine: NEGATIVE
Glucose, UA: NEGATIVE mg/dL
Hgb urine dipstick: NEGATIVE
Ketones, ur: NEGATIVE mg/dL
Leukocytes,Ua: NEGATIVE
Nitrite: NEGATIVE
Protein, ur: 30 mg/dL — AB
Specific Gravity, Urine: 1.03 (ref 1.005–1.030)
pH: 5 (ref 5.0–8.0)

## 2024-07-30 LAB — GLUCOSE, CAPILLARY
Glucose-Capillary: 134 mg/dL — ABNORMAL HIGH (ref 70–99)
Glucose-Capillary: 126 mg/dL — ABNORMAL HIGH (ref 70–99)

## 2024-07-30 LAB — RESP PANEL BY RT-PCR (RSV, FLU A&B, COVID)  RVPGX2
Influenza A by PCR: NEGATIVE
Influenza B by PCR: NEGATIVE
Resp Syncytial Virus by PCR: NEGATIVE
SARS Coronavirus 2 by RT PCR: NEGATIVE

## 2024-07-30 LAB — MRSA NEXT GEN BY PCR, NASAL: MRSA by PCR Next Gen: NOT DETECTED

## 2024-07-30 LAB — URINE DRUG SCREEN
Amphetamines: NEGATIVE
Barbiturates: NEGATIVE
Benzodiazepines: NEGATIVE
Cocaine: POSITIVE — AB
Fentanyl: NEGATIVE
Methadone Scn, Ur: NEGATIVE
Opiates: NEGATIVE
Tetrahydrocannabinol: POSITIVE — AB

## 2024-07-30 LAB — CBC WITH DIFFERENTIAL/PLATELET
Abs Immature Granulocytes: 0.03 K/uL (ref 0.00–0.07)
Basophils Absolute: 0.1 K/uL (ref 0.0–0.1)
Basophils Relative: 1 %
Eosinophils Absolute: 0.1 K/uL (ref 0.0–0.5)
Eosinophils Relative: 1 %
HCT: 40.2 % (ref 36.0–46.0)
Hemoglobin: 13.4 g/dL (ref 12.0–15.0)
Immature Granulocytes: 0 %
Lymphocytes Relative: 17 %
Lymphs Abs: 1.7 K/uL (ref 0.7–4.0)
MCH: 30.7 pg (ref 26.0–34.0)
MCHC: 33.3 g/dL (ref 30.0–36.0)
MCV: 92.2 fL (ref 80.0–100.0)
Monocytes Absolute: 1 K/uL (ref 0.1–1.0)
Monocytes Relative: 10 %
Neutro Abs: 7.1 K/uL (ref 1.7–7.7)
Neutrophils Relative %: 71 %
Platelets: 182 K/uL (ref 150–400)
RBC: 4.36 MIL/uL (ref 3.87–5.11)
RDW: 12.8 % (ref 11.5–15.5)
WBC: 9.9 K/uL (ref 4.0–10.5)
nRBC: 0 % (ref 0.0–0.2)

## 2024-07-30 LAB — TROPONIN T, HIGH SENSITIVITY: Troponin T High Sensitivity: <15 ng/L (ref 0–19)

## 2024-07-30 LAB — BLOOD GAS, ARTERIAL
Acid-base deficit: 0.1 mmol/L (ref 0.0–2.0)
Bicarbonate: 25.4 mmol/L (ref 20.0–28.0)
FIO2: 40 %
MECHVT: 450 mL
Mechanical Rate: 18
O2 Saturation: 98.9 %
PEEP: 5 cmH2O
Patient temperature: 37
RATE: 18 {breaths}/min
pCO2 arterial: 44 mmHg (ref 32–48)
pH, Arterial: 7.37 (ref 7.35–7.45)
pO2, Arterial: 129 mmHg — ABNORMAL HIGH (ref 83–108)

## 2024-07-30 LAB — PROTIME-INR
INR: 1.2 (ref 0.8–1.2)
Prothrombin Time: 15.4 s — ABNORMAL HIGH (ref 11.4–15.2)

## 2024-07-30 LAB — PREGNANCY, URINE: Preg Test, Ur: NEGATIVE

## 2024-07-30 LAB — ACETAMINOPHEN LEVEL: Acetaminophen (Tylenol), Serum: <10 ug/mL — ABNORMAL LOW (ref 10–30)

## 2024-07-30 LAB — PHOSPHORUS: Phosphorus: 3.5 mg/dL (ref 2.5–4.6)

## 2024-07-30 LAB — MAGNESIUM: Magnesium: 1.9 mg/dL (ref 1.7–2.4)

## 2024-07-30 LAB — TSH: TSH: 1.24 u[IU]/mL (ref 0.350–4.500)

## 2024-07-30 LAB — SALICYLATE LEVEL: Salicylate Lvl: <7 mg/dL — ABNORMAL LOW (ref 7.0–30.0)

## 2024-07-30 LAB — AMMONIA: Ammonia: 31 umol/L (ref 9–35)

## 2024-07-30 LAB — LACTIC ACID, PLASMA: Lactic Acid, Venous: 0.6 mmol/L (ref 0.5–1.9)

## 2024-07-30 MED ORDER — POTASSIUM CHLORIDE 10 MEQ/100ML IV SOLN
10.0000 meq | INTRAVENOUS | Status: AC
  Administered 2024-07-30 (×2): 10 meq via INTRAVENOUS
  Filled 2024-07-30 (×2): qty 100

## 2024-07-30 MED ORDER — ROCURONIUM BROMIDE 10 MG/ML (PF) SYRINGE
PREFILLED_SYRINGE | INTRAVENOUS | Status: AC
  Filled 2024-07-30: qty 10

## 2024-07-30 MED ORDER — POLYETHYLENE GLYCOL 3350 17 G PO PACK
17.0000 g | PACK | Freq: Every day | ORAL | Status: DC
  Administered 2024-07-30 – 2024-08-05 (×7): 17 g
  Filled 2024-07-30 (×7): qty 1

## 2024-07-30 MED ORDER — LACTATED RINGERS IV BOLUS
1000.0000 mL | Freq: Once | INTRAVENOUS | Status: AC
  Administered 2024-07-30: 1000 mL via INTRAVENOUS

## 2024-07-30 MED ORDER — ROCURONIUM BROMIDE 10 MG/ML (PF) SYRINGE
PREFILLED_SYRINGE | INTRAVENOUS | Status: AC | PRN
  Administered 2024-07-30: 100 mg via INTRAVENOUS

## 2024-07-30 MED ORDER — LACTATED RINGERS IV BOLUS
500.0000 mL | Freq: Once | INTRAVENOUS | Status: AC
  Administered 2024-07-30: 500 mL via INTRAVENOUS

## 2024-07-30 MED ORDER — LACTATED RINGERS IV SOLN: INTRAVENOUS | Status: AC

## 2024-07-30 MED ORDER — POLYETHYLENE GLYCOL 3350 17 G PO PACK: 17.0000 g | PACK | Freq: Every day | ORAL | Status: DC | PRN

## 2024-07-30 MED ORDER — PROPOFOL 1000 MG/100ML IV EMUL
INTRAVENOUS | Status: AC
  Administered 2024-07-30: 5 ug/kg/min via INTRAVENOUS
  Filled 2024-07-30: qty 100

## 2024-07-30 MED ORDER — KETAMINE HCL 10 MG/ML IJ SOLN
INTRAMUSCULAR | Status: AC | PRN
  Administered 2024-07-30: 80 mg via INTRAVENOUS

## 2024-07-30 MED ORDER — IPRATROPIUM-ALBUTEROL 0.5-2.5 (3) MG/3ML IN SOLN: 3.0000 mL | RESPIRATORY_TRACT | Status: DC | PRN

## 2024-07-30 MED ORDER — DOCUSATE SODIUM 50 MG/5ML PO LIQD
100.0000 mg | Freq: Two times a day (BID) | ORAL | Status: DC
  Administered 2024-07-30 – 2024-08-05 (×12): 100 mg
  Filled 2024-07-30 (×12): qty 10

## 2024-07-30 MED ORDER — SENNA 8.6 MG PO TABS
1.0000 | ORAL_TABLET | Freq: Two times a day (BID) | ORAL | Status: DC | PRN
  Administered 2024-08-02: 8.6 mg
  Filled 2024-07-30: qty 1

## 2024-07-30 MED ORDER — CHLORHEXIDINE GLUCONATE CLOTH 2 % EX PADS
6.0000 | MEDICATED_PAD | Freq: Every day | CUTANEOUS | Status: DC
  Administered 2024-07-30 – 2024-08-08 (×10): 6 via TOPICAL
  Filled 2024-07-30: qty 6

## 2024-07-30 MED ORDER — PANTOPRAZOLE SODIUM 40 MG IV SOLR
40.0000 mg | Freq: Every day | INTRAVENOUS | Status: DC
  Administered 2024-07-30 – 2024-08-06 (×8): 40 mg via INTRAVENOUS
  Filled 2024-07-30 (×8): qty 10

## 2024-07-30 MED ORDER — KETAMINE HCL 10 MG/ML IJ SOLN
INTRAMUSCULAR | Status: AC
  Filled 2024-07-30: qty 1

## 2024-07-30 MED ORDER — FENTANYL CITRATE (PF) 50 MCG/ML IJ SOSY
50.0000 ug | PREFILLED_SYRINGE | INTRAMUSCULAR | Status: DC | PRN
  Administered 2024-07-31 (×3): 100 ug via INTRAVENOUS
  Administered 2024-08-03 – 2024-08-04 (×4): 200 ug via INTRAVENOUS
  Administered 2024-08-04: 100 ug via INTRAVENOUS
  Administered 2024-08-04 (×5): 200 ug via INTRAVENOUS
  Administered 2024-08-05 (×3): 100 ug via INTRAVENOUS
  Filled 2024-07-30 (×7): qty 4
  Filled 2024-07-30 (×5): qty 2
  Filled 2024-07-30: qty 4
  Filled 2024-07-30: qty 2
  Filled 2024-07-30: qty 4
  Filled 2024-07-30: qty 2

## 2024-07-30 MED ORDER — FENTANYL CITRATE (PF) 50 MCG/ML IJ SOSY
50.0000 ug | PREFILLED_SYRINGE | INTRAMUSCULAR | Status: DC | PRN
  Administered 2024-07-31 – 2024-08-01 (×2): 50 ug via INTRAVENOUS
  Filled 2024-07-30 (×2): qty 1

## 2024-07-30 MED ORDER — POTASSIUM CHLORIDE 20 MEQ PO PACK
40.0000 meq | PACK | Freq: Once | ORAL | Status: AC
  Administered 2024-07-30: 40 meq
  Filled 2024-07-30: qty 2

## 2024-07-30 MED ORDER — ENOXAPARIN SODIUM 40 MG/0.4ML IJ SOSY
40.0000 mg | PREFILLED_SYRINGE | Freq: Every day | INTRAMUSCULAR | Status: DC
  Administered 2024-07-30 – 2024-08-07 (×9): 40 mg via SUBCUTANEOUS
  Filled 2024-07-30 (×10): qty 0.4

## 2024-07-30 MED ORDER — PROPOFOL 1000 MG/100ML IV EMUL
0.0000 ug/kg/min | INTRAVENOUS | Status: DC
  Administered 2024-07-31: 35 ug/kg/min via INTRAVENOUS
  Administered 2024-07-31 (×2): 25 ug/kg/min via INTRAVENOUS
  Administered 2024-08-01: 35 ug/kg/min via INTRAVENOUS
  Administered 2024-08-01: 50 ug/kg/min via INTRAVENOUS
  Administered 2024-08-01 (×2): 35 ug/kg/min via INTRAVENOUS
  Administered 2024-08-02: 50 ug/kg/min via INTRAVENOUS
  Administered 2024-08-02: 70 ug/kg/min via INTRAVENOUS
  Administered 2024-08-02: 50 ug/kg/min via INTRAVENOUS
  Administered 2024-08-02: 60 ug/kg/min via INTRAVENOUS
  Administered 2024-08-02: 70 ug/kg/min via INTRAVENOUS
  Administered 2024-08-02: 50 ug/kg/min via INTRAVENOUS
  Administered 2024-08-02: 70 ug/kg/min via INTRAVENOUS
  Administered 2024-08-02: 50 ug/kg/min via INTRAVENOUS
  Administered 2024-08-03: 45 ug/kg/min via INTRAVENOUS
  Administered 2024-08-03: 40 ug/kg/min via INTRAVENOUS
  Administered 2024-08-03: 45 ug/kg/min via INTRAVENOUS
  Administered 2024-08-03 – 2024-08-04 (×2): 40 ug/kg/min via INTRAVENOUS
  Administered 2024-08-04: 60 ug/kg/min via INTRAVENOUS
  Administered 2024-08-04: 70 ug/kg/min via INTRAVENOUS
  Administered 2024-08-04: 75 ug/kg/min via INTRAVENOUS
  Administered 2024-08-04: 50 ug/kg/min via INTRAVENOUS
  Administered 2024-08-04: 60 ug/kg/min via INTRAVENOUS
  Administered 2024-08-05 (×2): 70 ug/kg/min via INTRAVENOUS
  Administered 2024-08-05: 50 ug/kg/min via INTRAVENOUS
  Filled 2024-07-30 (×30): qty 100

## 2024-07-30 NOTE — ED Triage Notes (Signed)
 Pt [place] roadside via [facility]. Upon arrival pt had pinpoint pupils and was given narcan . Pt had minimal response to the narcan  but was able to breath on her own. Pt initial GCS 3. Ems reports pt had empty bottle of baclofen  with her.

## 2024-07-30 NOTE — Progress Notes (Signed)
 eLink Physician-Brief Progress Note Patient Name: Andrea Sanford DOB: [birth year] MRN: [id number]   Date of Service  07/30/2024  HPI/Events of Note  Patient admitted with altered mental status, and acute respiratory failure requiring intubation, secondary to a suspected poly-substance overdose.  eICU Interventions  New Patient Evaluation.        Andrea Sanford Andrea Sanford 07/30/2024, 8:02 PM

## 2024-07-30 NOTE — ED Notes (Signed)
 Bair hugger applied.

## 2024-07-30 NOTE — H&P (Signed)
 "  NAME:  Andrea Sanford, MRN:  [id number], DOB:  08/13/1970, LOS: 0 ADMISSION DATE:  07/30/2024, CONSULTATION DATE:  07/30/2024 REFERRING MD:  Dr. Dicky, CHIEF COMPLAINT:  Unresponsive, suspected drug overdose   Brief Pt Description / Synopsis:  54 year old female with past medical history significant for polysubstance abuse and bipolar disorder who presented after being found unresponsive in the setting of suspected drug overdose with possible baclofen  (UDS positive for cannabinoid and cocaine) requiring intubation and mechanical ventilation for airway protection.  History of Present Illness:  Andrea Sanford is a 54 year old female with a past medical history significant for polysubstance abuse, bipolar disorder, generalized anxiety disorder, hypertension, hyperlipidemia, diabetes mellitus type 2 who presents to [facility] ED on 07/30/2024 after being found unresponsive.  Patient is currently intubated and unable to contribute to history and no family is currently available, therefore history is extremely limited and is obtained from chart review.  Per ED and nursing notes, she was brought from roadside via EMS after being found unresponsive with pinpoint pupils, was hypoxic along with respiratory arrest.  She was given Narcan  with improvement in respiratory status, however remained unresponsive.  EMS was told on scene that the patient's significant other had a similar event/overdose within the last hour as well.  There was concern that the patient could have taken Baclofen  as well.  No evidence of trauma.  Upon arrival to the ED she remained unresponsive with sonorous respirations for which she was intubated for airway protection.  ED Course: Initial Vital Signs: Temperature 95.1 F, respiratory rate 13, pulse 54, blood pressure 152/86, SpO2 100% on the vent Significant Labs: Potassium 3.1, glucose 139 ABG postintubation: pH 7.37/pCO2 40/pO2 129/bicarb 25.4 Imaging Chest X-ray>>IMPRESSION: 1.  Endotracheal tube tip projects 4 cm above the carina. 2. Enteric tube extends below the diaphragm, with the tip beyond the inferior margin of the image. 3. No acute cardiopulmonary abnormality. CT Head wo contrast>>IMPRESSION: 1. No acute intracranial abnormality. 2. Bilateral displaced nasal bone fractures of indeterminate age. 3. Stable right maxillary sinus polyps versus mucous retention cysts. CT Cervical Spine>>IMPRESSION: 1. No acute fracture or subluxation in the cervical spine. 2. Mild to moderate severity degenerative changes at the level of C5-C6.   PCCM asked to admit for further workup and treatment.  Please see Significant Hospital Events section below for full detailed hospital course.   Pertinent  Medical History   Past Medical History:  Diagnosis Date   Allergy    Anxiety    Arthritis    Asthma    Bipolar 1 disorder (HCC)    Cancer (HCC)    COPD (chronic obstructive pulmonary disease) (HCC)    Depression    Diabetes mellitus without complication (HCC)    Drug abuse (HCC)    Hypertension    Neuromuscular disorder (HCC)    Osteoporosis    PTSD (post-traumatic stress disorder) 04/07/2024   Sleep apnea     Micro Data:  1/6: COVID/flu/RSV PCR>> 1/6: MRSA PCR>>  Antimicrobials:   Anti-infectives (From admission, onward)    None       Significant Hospital Events: Including procedures, antibiotic start and stop dates in addition to other pertinent events   1/6: Presented to ED unresponsive requiring intubation for airway protection.  PCCM asked to admit  Interim History / Subjective:  As outlined above under Significant Hospital Events section  Objective   Blood pressure (!) 164/115, pulse 65, temperature (!) 93.4 F (34.1 C), resp. rate 18, weight 80 kg, SpO2 100%.  Vent Mode: PRVC FiO2 (%):  [40 %] 40 % Set Rate:  [18 bmp] 18 bmp Vt Set:  [450 mL] 450 mL PEEP:  [5 cmH20] 5 cmH20   Intake/Output Summary (Last 24 hours) at 07/30/2024  1809 Last data filed at 07/30/2024 1757 Gross per 24 hour  Intake 1000 ml  Output --  Net 1000 ml   Filed Weights   07/30/24 1714  Weight: 80 kg    Examination: General: Critically ill-appearing female, laying in bed, intubated and sedated, no acute distress HENT: Atraumatic, normocephalic, neck supple, no JVD, orally intubated Lungs: Mechanical breath sounds throughout, even, nonlabored, synchronous with the vent Cardiovascular: Regular rate and rhythm, S1-S2, no murmurs, rubs, gallops Abdomen: Soft, nontender, nondistended, no guarding or tenderness, bowel sounds positive x 4 Extremities: No bulk and tone, no deformities, no edema, no cyanosis good peripheral perfusion Neuro: Sedated, currently unresponsive and not following commands or withdrawing to painful stimuli, PERRLA GU: Foley catheter in place draining yellow urine  Resolved Hospital Problem list     Assessment & Plan:   #Acute Metabolic Encephalopathy due to ... #Suspected Drug Overdose: unknown it intentional vs unintentional UDS + for cocaine, marijuana, concern for possible overdose of Baclofen  #Sedation needs in setting of mechanical ventilation -CT Head negative for acute intracranial abnormality  -Maintain a RASS goal of 0 to -1 -Propofol   to maintain RASS goal -Avoid sedating medications as able -Daily wake up assessment -TSH normal -Obtain EEG  #Suspected Drug Overdose: UDS + for cocaine, marijuana, concern for possible overdose of Baclofen  #Bradycardia  #Hypothermia  -Continuous cardiac monitoring -Maintain MAP >65 -IV fluids -Vasopressors as needed to maintain MAP goal -Lactic acid is normalized -HS Troponin negative  #Intubated for Airway Protection  #Acute Hypoxic Respiratory Failure due to drug overdose  -Full vent support, implement lung protective strategies -Plateau pressures less than 30 cm H20 -Wean FiO2 & PEEP as tolerated to maintain O2 sats >92% -Follow intermittent Chest X-ray &  ABG as needed -Spontaneous Breathing Trials when respiratory parameters met and mental status permits -Implement VAP Bundle -Prn Bronchodilators  #Hypokalemia -Monitor I&O's / urinary output -Follow BMP -Ensure adequate renal perfusion -Avoid nephrotoxic agents as able -Replace electrolytes as indicated ~ Pharmacy following for assistance with electrolyte replacement  #Type II Diabetes Mellitus -CBG's q4h; Target range of 140 to 180 -SSI -Follow ICU Hypo/Hyperglycemia protocol       Best Practice (right click and Reselect all SmartList Selections daily)   Diet/type: NPO DVT prophylaxis: LMWH GI prophylaxis: PPI Lines: N/A Foley:  Yes, and it is still needed Code Status:  full code Last date of multidisciplinary goals of care discussion [N/A]   Labs   CBC: Recent Labs  Lab 07/30/24 1634  WBC 9.9  NEUTROABS 7.1  HGB 13.4  HCT 40.2  MCV 92.2  PLT 182    Basic Metabolic Panel: Recent Labs  Lab 07/30/24 1634  NA 145  K 3.1*  CL 106  CO2 27  GLUCOSE 139*  BUN 23*  CREATININE 0.72  CALCIUM  9.2   GFR: Estimated Creatinine Clearance: 85.8 mL/min (by C-G formula based on SCr of 0.72 mg/dL). Recent Labs  Lab 07/30/24 1634 07/30/24 1635  WBC 9.9  --   LATICACIDVEN  --  0.6    Liver Function Tests: Recent Labs  Lab 07/30/24 1634  AST 20  ALT 11  ALKPHOS 110  BILITOT 0.6  PROT 6.7  ALBUMIN 4.3   No results for input(s): LIPASE, AMYLASE in the last 168  hours. Recent Labs  Lab 07/30/24 1634  AMMONIA 31    ABG    Component Value Date/Time   PHART 7.37 07/30/2024 1636   PCO2ART 44 07/30/2024 1636   PO2ART 129 (H) 07/30/2024 1636   HCO3 25.4 07/30/2024 1636   ACIDBASEDEF 0.1 07/30/2024 1636   O2SAT 98.9 07/30/2024 1636     Coagulation Profile: Recent Labs  Lab 07/30/24 1634  INR 1.2    Cardiac Enzymes: No results for input(s): CKTOTAL, CKMB, CKMBINDEX, TROPONINI in the last 168 hours.  HbA1C: Hemoglobin A1C   Date/Time Value Ref Range Status  04/24/2024 11:08 AM 6.2 (A) 4.0 - 5.6 % Final  10/03/2023 09:43 AM 6.6 (A) 4.0 - 5.6 % Final   Hgb A1c MFr Bld  Date/Time Value Ref Range Status  09/14/2023 06:18 AM 6.8 (H) 4.8 - 5.6 % Final    Comment:    (NOTE) Pre diabetes:          5.7%-6.4%  Diabetes:              >6.4%  Glycemic control for   <7.0% adults with diabetes   06/13/2023 02:40 PM 6.3 (H) 4.8 - 5.6 % Final    Comment:             Prediabetes: 5.7 - 6.4          Diabetes: >6.4          Glycemic control for adults with diabetes: <7.0     CBG: No results for input(s): GLUCAP in the last 168 hours.  Review of Systems:   Unable to assess due to intubation, sedation, AMS   Past Medical History:  She,  has a past medical history of Allergy, Anxiety, Arthritis, Asthma, Bipolar 1 disorder (HCC), Cancer (HCC), COPD (chronic obstructive pulmonary disease) (HCC), Depression, Diabetes mellitus without complication (HCC), Drug abuse (HCC), Hypertension, Neuromuscular disorder (HCC), Osteoporosis, PTSD (post-traumatic stress disorder) (04/07/2024), and Sleep apnea.   Surgical History:   Past Surgical History:  Procedure Laterality Date   ANKLE SURGERY Right    IR GASTROSTOMY TUBE MOD SED  09/04/2023   IR GASTROSTOMY TUBE REMOVAL  12/13/2023   IR PATIENT EVAL TECH 0-60 MINS  11/03/2023   PERIPHERAL VASCULAR THROMBECTOMY Left 1991   TRACHEOSTOMY TUBE PLACEMENT N/A 09/01/2023   Procedure: TRACHEOSTOMY;  Surgeon: Blair Mt, MD;  Location: [facility] ORS;  Service: ENT;  Laterality: N/A;     Social History:   reports that she has been smoking cigarettes. She has been exposed to tobacco smoke. She has never used smokeless tobacco. She reports that she does not currently use alcohol after a past usage of about 2.0 standard drinks of alcohol per week. She reports current drug use. Drug: Cocaine.   Family History:  Her family history includes Cancer in her father; Depression in her mother;  Diabetes in her mother.   Allergies Allergies[1]   Home Medications  Prior to Admission medications  Medication Sig Start Date End Date Taking? Authorizing Provider  ACCU-CHEK GUIDE TEST test strip 1 each by Other route 2 (two) times daily. 05/21/24   Ziglar, Susan K, MD  albuterol  (VENTOLIN  HFA) 108 (90 Base) MCG/ACT inhaler Inhale 2 puffs into the lungs every 6 (six) hours as needed for wheezing. 05/21/24   Ziglar, Susan K, MD  amLODipine  (NORVASC ) 5 MG tablet Take 1 tablet (5 mg total) by mouth daily. 05/21/24   Ziglar, Susan K, MD  Aspirin-Salicylamide-Caffeine  (ARTHRITIS STRENGTH BC POWDER PO) Take 3 packets by mouth every  6 (six) hours as needed (For pain).    [provider]  atorvastatin  (LIPITOR) 40 MG tablet Take 1 tablet (40 mg total) by mouth daily. 05/21/24   Ziglar, Susan K, MD  baclofen  (LIORESAL ) 10 MG tablet Take 1 tablet (10 mg total) by mouth 3 (three) times daily. 07/04/24   Ziglar, Susan K, MD  cetirizine  (ZYRTEC ) 10 MG tablet Take 1 tablet (10 mg total) by mouth daily. 06/07/24   Ziglar, Susan K, MD  doxycycline  (VIBRAMYCIN ) 100 MG capsule Take 1 capsule (100 mg total) by mouth 2 (two) times daily. 07/02/24   Ziglar, Susan K, MD  gabapentin  (NEURONTIN ) 300 MG capsule Take 2 capsules (600 mg total) by mouth 3 (three) times daily. 07/02/24   Ziglar, Susan K, MD  lactulose  (CHRONULAC ) 10 GM/15ML solution Take 15 mLs (10 g total) by mouth 2 (two) times daily. 04/11/24   Leigh Corean Massa, MD  lidocaine  (LIDODERM ) 5 % Place 1 patch onto the skin daily. Remove & Discard patch within 12 hours or as directed by MD 07/02/24   Ziglar, Susan K, MD  meloxicam  (MOBIC ) 15 MG tablet Take 1 tablet (15 mg total) by mouth daily. 07/02/24   Ziglar, Susan K, MD  montelukast  (SINGULAIR ) 10 MG tablet Take 1 tablet (10 mg total) by mouth at bedtime. 05/21/24   Ziglar, Susan K, MD  nicotine  (NICODERM CQ  - DOSED IN MG/24 HOURS) 21 mg/24hr patch Place 1 patch (21 mg total) onto the skin  daily. Patient not taking: Reported on 07/12/2024 04/12/24   Leigh Corean Massa, MD  predniSONE  (STERAPRED UNI-PAK 48 TAB) 10 MG (48) TBPK tablet Take by mouth daily. 12-day taper pack, use as directed for taper Patient not taking: Reported on 07/12/2024 05/13/24   Ziglar, Susan K, MD  QUEtiapine  (SEROQUEL ) 400 MG tablet Take 1 tablet (400 mg total) by mouth at bedtime. 07/02/24   Ziglar, Devere POUR, MD     Critical care time: 60 minutes     Inge Lecher, AGACNP-BC [facility] Prefer epic messenger for cross cover needs If after hours, please call E-link       [1] No Known Allergies  "

## 2024-07-30 NOTE — ED Provider Notes (Signed)
 "  [facility] Provider Note    Event Date/Time   First MD Initiated Contact with Patient 07/30/24 1634     (approximate)   History   Unresponsive EM caveat patient completely unresponsive.  History obtained from EMS  HPI  Andrea Sanford is a 54 y.o. female found unresponsive.  EMS reports initially was with respiratory arrest, responded to naloxone  4 mg.  Thereafter respiratory rate improved but mental status remains suppressed.  She was hypoxic requiring nonrebreather.  Blood glucose was appropriate.  EMS also reports that they were told on scene that her husband or someone she was with had had a similar event or overdose within the last hour as well.  There is concern that they were told that the patient also had baclofen , and suspicion that she may have overdosed although this is not known with certainty.  There was no trauma.     Reviewed external records from from PCP Dr. Ziglar on 06/28/24 - DMT2, (09/14/2023 A1c 6.8%, diet controlled), HTN, GAD/bipolar disorder/polysubstance abuse, mixed hyperlipidemia   Past Medical History:  Diagnosis Date   Allergy    Anxiety    Arthritis    Asthma    Bipolar 1 disorder (HCC)    Cancer (HCC)    COPD (chronic obstructive pulmonary disease) (HCC)    Depression    Diabetes mellitus without complication (HCC)    Drug abuse (HCC)    Hypertension    Neuromuscular disorder (HCC)    Osteoporosis    PTSD (post-traumatic stress disorder) 04/07/2024   Sleep apnea      Physical Exam   Triage Vital Signs: ED Triage Vitals [07/30/24 1630]  Encounter Vitals Group     BP (!) 152/86     Girls Systolic BP Percentile      Girls Diastolic BP Percentile      Boys Systolic BP Percentile      Boys Diastolic BP Percentile      Pulse Rate (!) 54     Resp 13     Temp      Temp src      SpO2 100 %     Weight      Height      Head Circumference      Peak Flow      Pain Score      Pain Loc      Pain Education       Exclude from Growth Chart     Most recent vital signs: Vitals:   07/30/24 1630 07/30/24 1633  BP: (!) 152/86 (!) 164/115  Pulse: (!) 54 98  Resp: 13 17  SpO2: 100% 100%     General: Unresponsive, sonorous respirations about 12/min CV:   Good peripheral perfusion. Normal rate and heart tones. Resp:  Irregular respiratory pattern.  Lung sounds rhonchorous lower lobes bilateral.  Abd:   No distention. Soft, non-tender to palpation in all quadrants. No rebound or guarding. Neuro:  Flaccid in all extremities.  No seizure-like activity.  Completely unresponsive.  Sonorous respirations.  Not respond to any stimuli.  Pupils are midpoint Other:     ED Results / Procedures / Treatments   Labs (all labs ordered are listed, but only abnormal results are displayed) Labs Reviewed  RESP PANEL BY RT-PCR (RSV, FLU A&B, COVID)  RVPGX2  CULTURE, BLOOD (SINGLE)  LACTIC ACID, PLASMA  LACTIC ACID, PLASMA  COMPREHENSIVE METABOLIC PANEL WITH GFR  CBC WITH DIFFERENTIAL/PLATELET  PROTIME-INR  BLOOD GAS, ARTERIAL  URINALYSIS, W/ REFLEX TO CULTURE (INFECTION SUSPECTED)  AMMONIA  TSH  CBG MONITORING, ED  CBG MONITORING, ED  CBG MONITORING, ED  CBG MONITORING, ED  TROPONIN T, HIGH SENSITIVITY     EKG  I independently reviewed the EKG at 1750 heart rate 65 QRS 90 QTc 470.  Normal sinus rhythm, mild repolarization abnormality unlikely Rowson acute ischemia   RADIOLOGY I independently reviewed images of chest x-ray demonstrates endotracheal tube in good position    PROCEDURES:  Critical Care performed: Yes, see critical care procedure note(s)  CRITICAL CARE Performed by: Oneil Budge   Total critical care time: 45 minutes  Critical care time was exclusive of separately billable procedures and treating other patients.  Critical care was necessary to treat or prevent imminent or life-threatening deterioration.  Critical care was time spent personally by me on the following activities:  development of treatment plan with patient and/or surrogate as well as nursing, discussions with consultants, evaluation of patient's response to treatment, examination of patient, obtaining history from patient or surrogate, ordering and performing treatments and interventions, ordering and review of laboratory studies, ordering and review of radiographic studies, pulse oximetry and re-evaluation of patient's condition.   Procedure Name: Intubation Date/Time: 07/30/2024 4:54 PM  Performed by: Budge Oneil, MDPre-anesthesia Checklist: Patient identified and Suction available Oxygen Delivery Method: Non-rebreather mask Preoxygenation: Pre-oxygenation with 100% oxygen Induction Type: Rapid sequence Laryngoscope Size: Glidescope and 3 Grade View: Grade II Tube type: Subglottic suction tube Tube size: 7.5 mm Number of attempts: 1 Airway Equipment and Method: Patient positioned with wedge pillow and Video-laryngoscopy Placement Confirmation: ETT inserted through vocal cords under direct vision, Positive ETCO2, CO2 detector and Breath sounds checked- equal and bilateral Tube secured with: ETT holder Dental Injury: Teeth and Oropharynx as per pre-operative assessment  Comments: No difficulty no hypotension or hypoxia.  Patient tolerated without difficulty.  Edentulous.  No injury       MEDICATIONS ORDERED IN ED: Medications  propofol  (DIPRIVAN ) 1000 MG/100ML infusion (has no administration in time range)  lactated ringers  bolus 1,000 mL (has no administration in time range)  rocuronium  (ZEMURON ) injection ( Intravenous Canceled Entry 07/30/24 1645)  ketamine  (KETALAR ) injection ( Intravenous Canceled Entry 07/30/24 1645)     IMPRESSION / MDM / ASSESSMENT AND PLAN / ED COURSE  I reviewed the triage vital signs and the nursing notes.                              Based on presentation, the differential diagnosis includes, but is not limited to key considerations: altered mental DIFFERENTIAL  DIAGNOSIS CONSIDERATIONS: High-acuity etiologies considered and prioritized for rule-out: - Hypoglycemia - Hypoxia / Hypercarbia - Sepsis / Meningitis / Encephalitis - Intracranial Hemorrhage / Stroke - Wernicke's Encephalopathy - Hypertensive Encephalopathy - Toxidrome / Withdrawal - Thyroid  Storm / Myxedema  Given the immediate context, high concern for overdose.  Evidently did respond to naloxone  with improvement in respiratory pattern but remained unresponsive with also a high concern for potential baclofen .  I have also reviewed her medication list  The intent is unknown.  She is currently intubated.  I have placed a psychiatry consult.  She does not present to high risk for elopement at this point being paralyzed and intubated.  She will certainly require psychiatry consult.  If the patient did self extubate or attempt to leave prior to psychiatry consult I would strongly consider placing under involuntary commitment given the unknown context  of what is suspected to be an overdose at this point though further workup is certainly pending    Workup focused on identifying reversible metabolic, infectious, or structural causes.    Patient's presentation is most consistent with acute presentation with potential threat to life or bodily function.    The patient is on the cardiac monitor to evaluate for evidence of arrhythmia and/or significant heart rate changes.  Clinical Course as of 07/31/24 0026  Tue Jul 30, 2024  1702 [facility] and Shelli Dolly of our psychiatry team both added to conversation.  PCP advises that they have been in communication with the patient today that she was upset and confrontational prior to this event occurring.  Psychiatry consult has been placed and nurse practitioner [place] notified [MQ]  1718 Patient resting comfortably currently well sedated on propofol .  Blood pressure 105 systolic.  Vital signs oxygenation etc. normal.  Currently proceeding  to CT scan [MQ]    Clinical Course User Index [MQ] Dicky Anes, MD   Labs reviewed CBC normal.  Tylenol  and salicylate levels negative on initial  DG Abd Portable 1 View Result Date: 07/30/2024 EXAM: 1 VIEW XRAY OF THE ABDOMEN 07/30/2024 06:53:00 PM COMPARISON: 02/11/2024 CLINICAL HISTORY: NG tube placement FINDINGS: LINES, TUBES AND DEVICES: NG tube in the proximal to mid stomach. BOWEL: Nonobstructive bowel gas pattern. SOFT TISSUES: No abnormal calcifications. BONES: No acute fracture. IMPRESSION: 1. NG tube in the proximal to mid stomach. 2. Nonobstructive bowel gas pattern. Electronically signed by: Franky Crease MD 07/30/2024 07:19 PM EST RP Workstation: [id number]   CT Cervical Spine Wo Contrast Result Date: 07/30/2024 CLINICAL DATA:  Suspected overdose. EXAM: CT CERVICAL SPINE WITHOUT CONTRAST TECHNIQUE: Multidetector CT imaging of the cervical spine was performed without intravenous contrast. Multiplanar CT image reconstructions were also generated. RADIATION DOSE REDUCTION: This exam was performed according to the departmental dose-optimization program which includes automated exposure control, adjustment of the mA and/or kV according to patient size and/or use of iterative reconstruction technique. COMPARISON:  None Available. FINDINGS: Alignment: Normal. Skull base and vertebrae: No acute fracture. No primary bone lesion or focal pathologic process. Soft tissues and spinal canal: No prevertebral fluid or swelling. No visible canal hematoma. Disc levels: Mild to moderate severity endplate sclerosis is seen at the level of C5-C6, with normal multilevel endplates noted throughout the remainder of the cervical spine. There is mild intervertebral disc space narrowing at the level of C5-C6 with normal intervertebral disc spaces present throughout the remainder of the cervical spine. Mild to moderate severity bilateral multilevel facet joint hypertrophy is noted. Upper chest: Negative. Other:  Endotracheal tube and orogastric tubes are in place. IMPRESSION: 1. No acute fracture or subluxation in the cervical spine. 2. Mild to moderate severity degenerative changes at the level of C5-C6. Electronically Signed   By: Suzen Dials M.D.   On: 07/30/2024 18:17   CT Head Wo Contrast Result Date: 07/30/2024 CLINICAL DATA:  Delirium. EXAM: CT HEAD WITHOUT CONTRAST TECHNIQUE: Contiguous axial images were obtained from the base of the skull through the vertex without intravenous contrast. RADIATION DOSE REDUCTION: This exam was performed according to the departmental dose-optimization program which includes automated exposure control, adjustment of the mA and/or kV according to patient size and/or use of iterative reconstruction technique. COMPARISON:  February 11, 2024 FINDINGS: Brain: No evidence of acute infarction, hemorrhage, hydrocephalus, extra-axial collection or mass lesion/mass effect. Vascular: No hyperdense vessel or unexpected calcification. Skull: Bilateral displaced nasal bone fractures of indeterminate age are noted.  Sinuses/Orbits: Stable right maxillary sinus polyps versus mucous retention cysts are seen. Other: Endotracheal and orogastric tubes are in place. IMPRESSION: 1. No acute intracranial abnormality. 2. Bilateral displaced nasal bone fractures of indeterminate age. 3. Stable right maxillary sinus polyps versus mucous retention cysts. Electronically Signed   By: Suzen Dials M.D.   On: 07/30/2024 18:11   DG Chest Port 1 View Result Date: 07/30/2024 EXAM: 1 VIEW(S) XRAY OF THE CHEST 07/30/2024 04:47:00 PM COMPARISON: 03/28/2024 CLINICAL HISTORY: Questionable sepsis - evaluate for abnormality FINDINGS: LINES, TUBES AND DEVICES: Endotracheal tube in place with tip 4 cm above the carina. Enteric tube in place extending below the diaphragm with tip beyond the inferior margin of the image. LUNGS AND PLEURA: No focal pulmonary opacity. No pleural effusion. No pneumothorax. HEART AND  MEDIASTINUM: No acute abnormality of the cardiac and mediastinal silhouettes. BONES AND SOFT TISSUES: Old right clavicular fracture. IMPRESSION: 1. Endotracheal tube tip projects 4 cm above the carina. 2. Enteric tube extends below the diaphragm, with the tip beyond the inferior margin of the image. 3. No acute cardiopulmonary abnormality. Electronically signed by: Greig Pique MD 07/30/2024 05:31 PM EST RP Workstation: [id number]   No family immediately available to update on patient's status.  Patient accepted admission to [facility] by Dr. Isaiah.  Pending consults include psychiatry.  FINAL CLINICAL IMPRESSION(S) / ED DIAGNOSES   Final diagnoses:  None     Rx / DC Orders   ED Discharge Orders     None        Note:  This document was prepared using Dragon voice recognition software and may include unintentional dictation errors.   Dicky Anes, MD 07/31/24 0028  "

## 2024-07-30 NOTE — Patient Outreach (Signed)
 Phone call from patient stating that she was just released from jail and she is out of her medications. Patient reminded of ACT([facility]) appointment at 2pm today. Patient erratic, speech tangential, yelling , crying. Highly recommended that she present to the emergency room however patient adamantly declined. Patient's doctor assisted with call recommending ED support as well as keeping appointment with ACT.  Call ended with patient agreeing to attend ACT team appointment today. She also agreed to call this child psychotherapist when she gets to the ACT appointment today.   Denyce Harr, LCSW [place]  [facility], [facility] Licensed Clinical Social Worker  Direct Dial: [phone]

## 2024-07-30 NOTE — Progress Notes (Signed)
 Transported to ICU with no events.

## 2024-07-30 NOTE — Patient Outreach (Signed)
 Phone call to patient to remind her of intake appointment with [facility] ACT. Patient did not answer. CSW given an alternate number [phone] by brother-voicemail not set up.   Teegan Guinther, LCSW [place]  [facility], [facility] Licensed Clinical Social Worker  Direct Dial: [phone]

## 2024-07-31 ENCOUNTER — Inpatient Hospital Stay: Payer: MEDICAID

## 2024-07-31 DIAGNOSIS — J9602 Acute respiratory failure with hypercapnia: Secondary | ICD-10-CM | POA: Diagnosis not present

## 2024-07-31 DIAGNOSIS — J9601 Acute respiratory failure with hypoxia: Secondary | ICD-10-CM | POA: Diagnosis not present

## 2024-07-31 DIAGNOSIS — F14129 Cocaine abuse with intoxication, unspecified: Secondary | ICD-10-CM | POA: Diagnosis not present

## 2024-07-31 DIAGNOSIS — G9341 Metabolic encephalopathy: Secondary | ICD-10-CM | POA: Diagnosis not present

## 2024-07-31 LAB — CBC
HCT: 40.1 % (ref 36.0–46.0)
Hemoglobin: 12.8 g/dL (ref 12.0–15.0)
MCH: 30 pg (ref 26.0–34.0)
MCHC: 31.9 g/dL (ref 30.0–36.0)
MCV: 94.1 fL (ref 80.0–100.0)
Platelets: 172 K/uL (ref 150–400)
RBC: 4.26 MIL/uL (ref 3.87–5.11)
RDW: 12.8 % (ref 11.5–15.5)
WBC: 12.3 K/uL — ABNORMAL HIGH (ref 4.0–10.5)
nRBC: 0 % (ref 0.0–0.2)

## 2024-07-31 LAB — RENAL FUNCTION PANEL
Albumin: 3.8 g/dL (ref 3.5–5.0)
Anion gap: 10 (ref 5–15)
BUN: 18 mg/dL (ref 6–20)
CO2: 24 mmol/L (ref 22–32)
Calcium: 9.4 mg/dL (ref 8.9–10.3)
Chloride: 110 mmol/L (ref 98–111)
Creatinine, Ser: 0.71 mg/dL (ref 0.44–1.00)
GFR, Estimated: >60 mL/min
Glucose, Bld: 137 mg/dL — ABNORMAL HIGH (ref 70–99)
Phosphorus: 3.2 mg/dL (ref 2.5–4.6)
Potassium: 4 mmol/L (ref 3.5–5.1)
Sodium: 144 mmol/L (ref 135–145)

## 2024-07-31 LAB — GLUCOSE, CAPILLARY
Glucose-Capillary: 127 mg/dL — ABNORMAL HIGH (ref 70–99)
Glucose-Capillary: 138 mg/dL — ABNORMAL HIGH (ref 70–99)
Glucose-Capillary: 123 mg/dL — ABNORMAL HIGH (ref 70–99)
Glucose-Capillary: 88 mg/dL (ref 70–99)
Glucose-Capillary: 85 mg/dL (ref 70–99)
Glucose-Capillary: 106 mg/dL — ABNORMAL HIGH (ref 70–99)
Glucose-Capillary: 110 mg/dL — ABNORMAL HIGH (ref 70–99)

## 2024-07-31 LAB — URINE CULTURE: Culture: NO GROWTH

## 2024-07-31 LAB — MAGNESIUM: Magnesium: 2 mg/dL (ref 1.7–2.4)

## 2024-07-31 MED ORDER — ORAL CARE MOUTH RINSE
15.0000 mL | OROMUCOSAL | Status: DC
  Administered 2024-07-31 – 2024-08-05 (×63): 15 mL via OROMUCOSAL

## 2024-07-31 MED ORDER — PROSOURCE TF20 ENFIT COMPATIBL EN LIQD
60.0000 mL | Freq: Every day | ENTERAL | Status: DC
  Administered 2024-08-01 – 2024-08-05 (×5): 60 mL
  Filled 2024-07-31: qty 60

## 2024-07-31 MED ORDER — THIAMINE HCL 100 MG PO TABS
100.0000 mg | ORAL_TABLET | Freq: Every day | ORAL | Status: DC
  Administered 2024-08-01 – 2024-08-05 (×5): 100 mg
  Filled 2024-07-31 (×5): qty 1

## 2024-07-31 MED ORDER — LACTATED RINGERS IV SOLN: INTRAVENOUS | Status: AC

## 2024-07-31 MED ORDER — OXYCODONE HCL 5 MG PO TABS
5.0000 mg | ORAL_TABLET | Freq: Three times a day (TID) | ORAL | Status: DC
  Administered 2024-07-31 – 2024-08-05 (×16): 5 mg
  Filled 2024-07-31 (×16): qty 1

## 2024-07-31 MED ORDER — FREE WATER
30.0000 mL | Status: DC
  Administered 2024-07-31 – 2024-08-05 (×30): 30 mL

## 2024-07-31 MED ORDER — ORAL CARE MOUTH RINSE: 15.0000 mL | OROMUCOSAL | Status: DC | PRN

## 2024-07-31 MED ORDER — GABAPENTIN 300 MG PO CAPS
600.0000 mg | ORAL_CAPSULE | Freq: Three times a day (TID) | ORAL | Status: DC
  Administered 2024-07-31 – 2024-08-06 (×17): 600 mg
  Filled 2024-07-31 (×17): qty 2

## 2024-07-31 MED ORDER — JUVEN PO PACK
1.0000 | PACK | Freq: Two times a day (BID) | ORAL | Status: DC
  Administered 2024-08-01 – 2024-08-07 (×10): 1

## 2024-07-31 MED ORDER — VITAL AF 1.2 CAL PO LIQD
1000.0000 mL | ORAL | Status: AC
  Administered 2024-07-31 – 2024-08-05 (×6): 1000 mL

## 2024-07-31 MED ORDER — QUETIAPINE FUMARATE 100 MG PO TABS
400.0000 mg | ORAL_TABLET | Freq: Every day | ORAL | Status: DC
  Administered 2024-07-31 – 2024-08-04 (×5): 400 mg
  Filled 2024-07-31 (×5): qty 4

## 2024-07-31 NOTE — Consult Note (Signed)
 Psychiatry consult was received and reviewed. Consult indicates psychiatry evaluation is needed due to possible overdose with unknown intent. Psychiatry attempted assessment today. However, patient was noted to be intubated at this time, precluding verbal communication and comprehensive psychiatric evaluation.  Psychiatry is unable to complete assessment today due to patient's current intubated status and inability to participate in interview. Psychiatry will defer comprehensive psychiatric evaluation until patient is extubated and able to engage in assessment. Will continue to monitor patient's medical status and will reassess once patient is medically stable and able to communicate. Please notify psychiatry when patient is extubated and able to participate in evaluation so that comprehensive risk assessment and psychiatric evaluation can be completed.

## 2024-07-31 NOTE — Progress Notes (Signed)
 Initial Nutrition Assessment  DOCUMENTATION CODES:   Not applicable  INTERVENTION:   Vital 1.2@55ml /hr- Initiate at 34ml/hr and increase by 10ml/hr q 8 hours until goal rate is reached.   ProSource TF 20- Give 60ml daily via tube, each supplement provides 80kcal and 20g of protein.   Free water  flushes 30ml q4 hours to maintain tube patency   Regimen provides 1664kcal/day, 119g/day protein and 1250ml/day of free water .   Pt at high refeed risk; recommend monitor potassium, magnesium  and phosphorus labs daily until stable  Thiamine  100mg  daily via tube x 30 days   Juven Fruit Punch daily via tube, each serving provides 95kcal and 2.5g of protein (amino acids glutamine and arginine)  Daily weights   NUTRITION DIAGNOSIS:   Inadequate oral intake related to inability to eat (pt sedated and ventilated) as evidenced by NPO status.  GOAL:   Provide needs based on ASPEN/SCCM guidelines  MONITOR:   Vent status, Labs, Weight trends, TF tolerance, I & O's, Skin  REASON FOR ASSESSMENT:   Ventilator    ASSESSMENT:   54 y/o female with h/o DM, bipolar disorder, substance abuse, HTN, neuromuscular disorder, osteoporosis, anxiety, depression, COPD, OSA, chronic right clavicle fracture, homelessness and a lengthy admission for overdose complicated by aspiration PNA (requiring IR G-tube placed 09/04/23 & removed 12/13/23) and tracheostomy (placed 09/01/2023 & removed 11/06/23) and who is now admitted with metabolic encephalopathy secondary to cocaine toxicity requiring intubation for airway protection.  Pt is well known to this RD from numerous previous admissions. Pt sedated and ventilated. NGT in place. Will plan to initiate tube feeds today. Pt is at high refeed risk. Per chart, pt appears weight stable at baseline.   Medications reviewed and include: colace, lovenox , oxycodone , protonix , miralax , LRS @75ml /hr, propofol     Labs reviewed: K 4.0 wnl, P 3.2 wnl, Mg 2.0 wnl  Wbc-  12.3(H) Cbgs- 85, 88, 123 x 24 hrs  AIC 6.2(H)- 10/1  Patient is currently intubated on ventilator support MV: 8.1 L/min Temp (24hrs), Avg:97.8 F (36.6 C), Min:93.4 F (34.1 C), Max:99.3 F (37.4 C)  Propofol : 12 ml/hr- provides 316kcal/day   MAP >102mmHg   UOP-   NUTRITION - FOCUSED PHYSICAL EXAM:  Flowsheet Row Most Recent Value  Orbital Region No depletion  Upper Arm Region Mild depletion  Thoracic and Lumbar Region No depletion  Buccal Region No depletion  Temple Region Mild depletion  Clavicle Bone Region Mild depletion  Clavicle and Acromion Bone Region Mild depletion  Scapular Bone Region No depletion  Dorsal Hand No depletion  Patellar Region No depletion  Anterior Thigh Region Mild depletion  Posterior Calf Region Mild depletion  Edema (RD Assessment) None  Hair Reviewed  Eyes Reviewed  Mouth Reviewed  Skin Reviewed  Nails Reviewed   Diet Order:   Diet Order             Diet NPO time specified  Diet effective now                  EDUCATION NEEDS:   No education needs have been identified at this time  Skin:  Skin Assessment: Reviewed RN Assessment (Stage I sacrum)  Last BM:  pta  Height:   Ht Readings from Last 1 Encounters:  07/30/24 5' 6 (1.676 m)    Weight:   Wt Readings from Last 1 Encounters:  07/31/24 78.7 kg    Ideal Body Weight:  59 kg  BMI:  Body mass index is 28 kg/m.  Estimated  Nutritional Needs:   Kcal:  1633kcal/day  Protein:  110-125g/day  Fluid:  1.8-2.1L/day  Augustin Shams MS, RD, LDN If unable to be reached, please send secure chat to RD inpatient available from 8:00a-4:00p daily

## 2024-07-31 NOTE — Progress Notes (Signed)
 PHARMACY CONSULT NOTE - ELECTROLYTES  Pharmacy Consult for Electrolyte Monitoring and Replacement   Recent Labs: Height: 5' 6 (167.6 cm) Weight: 78.7 kg (173 lb 8 oz) IBW/kg (Calculated) : 59.3 Estimated Creatinine Clearance: 85.2 mL/min (by C-G formula based on SCr of 0.71 mg/dL). Potassium (mmol/L)  Date Value  07/31/2024 4.0  10/15/2012 3.0 (L)   Magnesium  (mg/dL)  Date Value  98/92/7973 2.0  10/04/2011 1.4 (L)   Calcium  (mg/dL)  Date Value  98/92/7973 9.4   Calcium , Total (mg/dL)  Date Value  96/75/7985 8.3 (L)   Albumin (g/dL)  Date Value  98/92/7973 3.8  10/03/2023 4.0  10/15/2012 3.9   Phosphorus (mg/dL)  Date Value  98/92/7973 3.2   Sodium (mmol/L)  Date Value  07/31/2024 144  10/03/2023 139  10/15/2012 141   Corrected Ca: 9.6 mg/dL  Assessment  Andrea Sanford is a 54 y.o. female presenting with suspected drug overdose with possible baclofen  (UDS positive for cannabinoid and cocaine) requiring intubation and mechanical ventilation for airway protection. PMH significant for polysubstance abuse and bipolar disorder. Pharmacy has been consulted to monitor and replace electrolytes.  Diet: NPO MIVF: LR @ 75 mL/hr Pertinent medications: N/A  Goal of Therapy: Electrolytes WNL  Plan:  No supplementation needed at this time Check BMP, Mg, Phos with AM labs  Thank you for allowing pharmacy to be a part of this patient's care.  Lum VEAR Mania, PharmD Clinical Pharmacist 07/31/2024 8:36 AM

## 2024-07-31 NOTE — Progress Notes (Signed)
 Eeg done

## 2024-07-31 NOTE — H&P (Addendum)
 "  NAME:  Andrea Sanford, MRN:  [id number], DOB:  1970-09-16, LOS: 1 ADMISSION DATE:  07/30/2024, CONSULTATION DATE:  07/30/2024 REFERRING MD:  Dr. Dicky,   CHIEF COMPLAINT:  Unresponsive, suspected drug overdose  COCAINE TOXICITY  Brief Pt Description / Synopsis:  54 year old female with past medical history significant for polysubstance abuse and bipolar disorder who presented after being found unresponsive in the setting of suspected drug overdose with possible baclofen  (UDS positive for cannabinoid and cocaine) requiring intubation and mechanical ventilation for airway protection.  History of Present Illness:  Andrea Sanford is a 54 year old female with a past medical history significant for polysubstance abuse, bipolar disorder, generalized anxiety disorder, hypertension, hyperlipidemia, diabetes mellitus type 2 who presents to [facility] ED on 07/30/2024 after being found unresponsive.  Patient is currently intubated and unable to contribute to history and no family is currently available, therefore history is extremely limited and is obtained from chart review.  Per ED and nursing notes, she [place] roadside via EMS after being found unresponsive with pinpoint pupils, was hypoxic along with respiratory arrest.  She was given Narcan  with improvement in respiratory status, however remained unresponsive.  EMS was told on scene that the patient's significant other had a similar event/overdose within the last hour as well.  There was concern that the patient could have taken Baclofen  as well.  No evidence of trauma.  Upon arrival to the ED she remained unresponsive with sonorous respirations for which she was intubated for airway protection.  ED Course: Initial Vital Signs: Temperature 95.1 F, respiratory rate 13, pulse 54, blood pressure 152/86, SpO2 100% on the vent Significant Labs: Potassium 3.1, glucose 139 ABG postintubation: pH 7.37/pCO2 40/pO2 129/bicarb 25.4 Imaging Chest  X-ray>>IMPRESSION: 1. Endotracheal tube tip projects 4 cm above the carina. 2. Enteric tube extends below the diaphragm, with the tip beyond the inferior margin of the image. 3. No acute cardiopulmonary abnormality. CT Head wo contrast>>IMPRESSION: 1. No acute intracranial abnormality. 2. Bilateral displaced nasal bone fractures of indeterminate age. 3. Stable right maxillary sinus polyps versus mucous retention cysts. CT Cervical Spine>>IMPRESSION: 1. No acute fracture or subluxation in the cervical spine. 2. Mild to moderate severity degenerative changes at the level of C5-C6.   PCCM asked to admit for further workup and treatment.   Pertinent  Medical History   Past Medical History:  Diagnosis Date   Allergy    Anxiety    Arthritis    Asthma    Bipolar 1 disorder (HCC)    Cancer (HCC)    COPD (chronic obstructive pulmonary disease) (HCC)    Depression    Diabetes mellitus without complication (HCC)    Drug abuse (HCC)    Hypertension    Neuromuscular disorder (HCC)    Osteoporosis    PTSD (post-traumatic stress disorder) 04/07/2024   Sleep apnea     Micro Data:  1/6: COVID/flu/RSV PCR>> 1/6: MRSA PCR>>  Antimicrobials:   Anti-infectives (From admission, onward)    None       Significant Hospital Events: Including procedures, antibiotic start and stop dates in addition to other pertinent events   1/6: Presented to ED unresponsive requiring intubation for airway protection.  PCCM asked to admit 1/7 remains encephalopathic  Interim History / Subjective:  Remains critically ill Remains intubated Requires VENT support for survival  Vent Mode: PRVC FiO2 (%):  [40 %] 40 % Set Rate:  [18 bmp] 18 bmp Vt Set:  [450 mL] 450 mL PEEP:  [5 cmH20]  5 cmH20 Plateau Pressure:  [14 cmH20-16 cmH20] 14 cmH20   Objective   Blood pressure 131/78, pulse (!) 56, temperature 99 F (37.2 C), resp. rate 18, height 5' 6 (1.676 m), weight 78.7 kg, SpO2 100%.    Vent  Mode: PRVC FiO2 (%):  [40 %] 40 % Set Rate:  [18 bmp] 18 bmp Vt Set:  [450 mL] 450 mL PEEP:  [5 cmH20] 5 cmH20 Plateau Pressure:  [14 cmH20-16 cmH20] 14 cmH20   Intake/Output Summary (Last 24 hours) at 07/31/2024 0733 Last data filed at 07/31/2024 0600 Gross per 24 hour  Intake 2711.52 ml  Output 800 ml  Net 1911.52 ml   Filed Weights   07/30/24 1714 07/30/24 1950 07/31/24 0500  Weight: 80 kg 78.7 kg 78.7 kg     REVIEW OF SYSTEMS  PATIENT IS UNABLE TO PROVIDE COMPLETE REVIEW OF SYSTEMS DUE TO SEVERE CRITICAL ILLNESS   PHYSICAL EXAMINATION:  GENERAL:critically ill appearing EYES: Pupils equal, round, reactive to light.  No scleral icterus.  MOUTH: Moist mucosal membrane. INTUBATED PULMONARY: Lungs clear to auscultation, +rhonchi CARDIOVASCULAR: S1 and S2.  Regular rate and rhythm GASTROINTESTINAL: Soft, nontender, -distended. Positive bowel sounds.  NEUROLOGIC: obtunded,sedated   Assessment & Plan:   54 yo white female with severe metabolic encephalopathy from severe cocaine toxicity leading to inability to protect airway and very high risk for aspiration   NEUROLOGY ACUTE METABOLIC ENCEPHALOPATHY-COCAINE -need for sedation -Goal RASS -2 to -3  Severe ACUTE Hypoxic and Hypercapnic Respiratory Failure -continue Mechanical Ventilator support -Wean Fio2 and PEEP as tolerated -VAP/VENT bundle implementation - Wean PEEP & FiO2 as tolerated, maintain SpO2 > 88% - Head of bed elevated 30 degrees, VAP protocol in place - Plateau pressures less than 30 cm H20  - Intermittent chest x-ray & ABG PRN - Ensure adequate pulmonary hygiene  Unable to wean due to severe encephalopathy    ENDO - ICU hypoglycemic\Hyperglycemia protocol -check FSBS per protocol   GI GI PROPHYLAXIS as indicated NUTRITIONAL STATUS DIET-->TF's as tolerated Constipation protocol as indicated   ELECTROLYTES -follow labs as needed -replace as needed -pharmacy consultation and  following  RESTRICTIVE TRANSFUSION PROTOCOL TRANSFUSION  IF HGB<7  or ACTIVE BLEEDING OR DX of ACUTE CORONARY SYNDROMES         Best Practice (right click and Reselect all SmartList Selections daily)   Diet/type: NPO DVT prophylaxis: LMWH GI prophylaxis: PPI Lines: N/A Foley:  Yes, and it is still needed Code Status:  full code Last date of multidisciplinary goals of care discussion [N/A]   Labs   CBC: Recent Labs  Lab 07/30/24 1634 07/31/24 0339  WBC 9.9 12.3*  NEUTROABS 7.1  --   HGB 13.4 12.8  HCT 40.2 40.1  MCV 92.2 94.1  PLT 182 172    Basic Metabolic Panel: Recent Labs  Lab 07/30/24 1634 07/31/24 0339  NA 145 144  K 3.1* 4.0  CL 106 110  CO2 27 24  GLUCOSE 139* 137*  BUN 23* 18  CREATININE 0.72 0.71  CALCIUM  9.2 9.4  MG 1.9 2.0  PHOS 3.5 3.2   GFR: Estimated Creatinine Clearance: 85.2 mL/min (by C-G formula based on SCr of 0.71 mg/dL). Recent Labs  Lab 07/30/24 1634 07/30/24 1635 07/31/24 0339  WBC 9.9  --  12.3*  LATICACIDVEN  --  0.6  --     Liver Function Tests: Recent Labs  Lab 07/30/24 1634 07/31/24 0339  AST 20  --   ALT 11  --   ALKPHOS  110  --   BILITOT 0.6  --   PROT 6.7  --   ALBUMIN 4.3 3.8   No results for input(s): LIPASE, AMYLASE in the last 168 hours. Recent Labs  Lab 07/30/24 1634  AMMONIA 31    ABG    Component Value Date/Time   PHART 7.37 07/30/2024 1636   PCO2ART 44 07/30/2024 1636   PO2ART 129 (H) 07/30/2024 1636   HCO3 25.4 07/30/2024 1636   ACIDBASEDEF 0.1 07/30/2024 1636   O2SAT 98.9 07/30/2024 1636     Coagulation Profile: Recent Labs  Lab 07/30/24 1634  INR 1.2    Cardiac Enzymes: No results for input(s): CKTOTAL, CKMB, CKMBINDEX, TROPONINI in the last 168 hours.  HbA1C: Hemoglobin A1C  Date/Time Value Ref Range Status  04/24/2024 11:08 AM 6.2 (A) 4.0 - 5.6 % Final  10/03/2023 09:43 AM 6.6 (A) 4.0 - 5.6 % Final   Hgb A1c MFr Bld  Date/Time Value Ref Range Status   09/14/2023 06:18 AM 6.8 (H) 4.8 - 5.6 % Final    Comment:    (NOTE) Pre diabetes:          5.7%-6.4%  Diabetes:              >6.4%  Glycemic control for   <7.0% adults with diabetes   06/13/2023 02:40 PM 6.3 (H) 4.8 - 5.6 % Final    Comment:             Prediabetes: 5.7 - 6.4          Diabetes: >6.4          Glycemic control for adults with diabetes: <7.0     CBG: Recent Labs  Lab 07/30/24 1937 07/30/24 2322 07/31/24 0321 07/31/24 0420  GLUCAP 134* 126* 127* 138*    Review of Systems:   Unable to assess due to intubation, sedation, AMS   Past Medical History:  She,  has a past medical history of Allergy, Anxiety, Arthritis, Asthma, Bipolar 1 disorder (HCC), Cancer (HCC), COPD (chronic obstructive pulmonary disease) (HCC), Depression, Diabetes mellitus without complication (HCC), Drug abuse (HCC), Hypertension, Neuromuscular disorder (HCC), Osteoporosis, PTSD (post-traumatic stress disorder) (04/07/2024), and Sleep apnea.   Surgical History:   Past Surgical History:  Procedure Laterality Date   ANKLE SURGERY Right    IR GASTROSTOMY TUBE MOD SED  09/04/2023   IR GASTROSTOMY TUBE REMOVAL  12/13/2023   IR PATIENT EVAL TECH 0-60 MINS  11/03/2023   PERIPHERAL VASCULAR THROMBECTOMY Left 1991   TRACHEOSTOMY TUBE PLACEMENT N/A 09/01/2023   Procedure: TRACHEOSTOMY;  Surgeon: Blair Mt, MD;  Location: [facility] ORS;  Service: ENT;  Laterality: N/A;     Social History:   reports that she has been smoking cigarettes. She has been exposed to tobacco smoke. She has never used smokeless tobacco. She reports that she does not currently use alcohol after a past usage of about 2.0 standard drinks of alcohol per week. She reports current drug use. Drug: Cocaine.   Family History:  Her family history includes Cancer in her father; Depression in her mother; Diabetes in her mother.   Allergies Allergies[1]   Home Medications  Prior to Admission medications  Medication Sig Start Date  End Date Taking? Authorizing Provider  ACCU-CHEK GUIDE TEST test strip 1 each by Other route 2 (two) times daily. 05/21/24   Ziglar, Susan K, MD  albuterol  (VENTOLIN  HFA) 108 (90 Base) MCG/ACT inhaler Inhale 2 puffs into the lungs every 6 (six) hours as needed for wheezing.  05/21/24   Ziglar, Susan K, MD  amLODipine  (NORVASC ) 5 MG tablet Take 1 tablet (5 mg total) by mouth daily. 05/21/24   Ziglar, Susan K, MD  Aspirin-Salicylamide-Caffeine  (ARTHRITIS STRENGTH BC POWDER PO) Take 3 packets by mouth every 6 (six) hours as needed (For pain).    [provider]  atorvastatin  (LIPITOR) 40 MG tablet Take 1 tablet (40 mg total) by mouth daily. 05/21/24   Ziglar, Susan K, MD  baclofen  (LIORESAL ) 10 MG tablet Take 1 tablet (10 mg total) by mouth 3 (three) times daily. 07/04/24   Ziglar, Susan K, MD  cetirizine  (ZYRTEC ) 10 MG tablet Take 1 tablet (10 mg total) by mouth daily. 06/07/24   Ziglar, Susan K, MD  doxycycline  (VIBRAMYCIN ) 100 MG capsule Take 1 capsule (100 mg total) by mouth 2 (two) times daily. 07/02/24   Ziglar, Susan K, MD  gabapentin  (NEURONTIN ) 300 MG capsule Take 2 capsules (600 mg total) by mouth 3 (three) times daily. 07/02/24   Ziglar, Susan K, MD  lactulose  (CHRONULAC ) 10 GM/15ML solution Take 15 mLs (10 g total) by mouth 2 (two) times daily. 04/11/24   Leigh Corean Massa, MD  lidocaine  (LIDODERM ) 5 % Place 1 patch onto the skin daily. Remove & Discard patch within 12 hours or as directed by MD 07/02/24   Ziglar, Susan K, MD  meloxicam  (MOBIC ) 15 MG tablet Take 1 tablet (15 mg total) by mouth daily. 07/02/24   Ziglar, Susan K, MD  montelukast  (SINGULAIR ) 10 MG tablet Take 1 tablet (10 mg total) by mouth at bedtime. 05/21/24   Ziglar, Susan K, MD  nicotine  (NICODERM CQ  - DOSED IN MG/24 HOURS) 21 mg/24hr patch Place 1 patch (21 mg total) onto the skin daily. Patient not taking: Reported on 07/12/2024 04/12/24   Leigh Corean Massa, MD  predniSONE  (STERAPRED UNI-PAK 48 TAB) 10 MG (48)  TBPK tablet Take by mouth daily. 12-day taper pack, use as directed for taper Patient not taking: Reported on 07/12/2024 05/13/24   Ziglar, Susan K, MD  QUEtiapine  (SEROQUEL ) 400 MG tablet Take 1 tablet (400 mg total) by mouth at bedtime. 07/02/24   Ziglar, Susan K, MD       DVT/GI PRX  assessed I Assessed the need for Labs I Assessed the need for Foley I Assessed the need for Central Venous Line Family Discussion when available I Assessed the need for Mobilization I made an Assessment of medications to be adjusted accordingly Safety Risk assessment completed  CASE DISCUSSED IN MULTIDISCIPLINARY ROUNDS WITH ICU TEAM     Critical Care Time devoted to patient care services described in this note is 50 minutes.  Critical care was necessary to treat /prevent imminent and life-threatening deterioration.   Nickolas Alm Cellar, M.D.  [facility]  Medical Director [facility] [place]            [1] No Known Allergies  "

## 2024-07-31 NOTE — Plan of Care (Signed)
  Problem: Activity: Goal: Ability to tolerate increased activity will improve Outcome: Not Progressing   Problem: Respiratory: Goal: Ability to maintain a clear airway and adequate ventilation will improve Outcome: Not Progressing   Problem: Role Relationship: Goal: Method of communication will improve Outcome: Not Progressing   Problem: Education: Goal: Knowledge of General Education information will improve Description: Including pain rating scale, medication(s)/side effects and non-pharmacologic comfort measures Outcome: Not Progressing   Problem: Health Behavior/Discharge Planning: Goal: Ability to manage health-related needs will improve Outcome: Not Progressing   Problem: Clinical Measurements: Goal: Ability to maintain clinical measurements within normal limits will improve Outcome: Not Progressing Goal: Will remain free from infection Outcome: Not Progressing Goal: Diagnostic test results will improve Outcome: Not Progressing Goal: Respiratory complications will improve Outcome: Not Progressing Goal: Cardiovascular complication will be avoided Outcome: Not Progressing   Problem: Activity: Goal: Risk for activity intolerance will decrease Outcome: Not Progressing   Problem: Nutrition: Goal: Adequate nutrition will be maintained Outcome: Not Progressing   Problem: Coping: Goal: Level of anxiety will decrease Outcome: Not Progressing   Problem: Elimination: Goal: Will not experience complications related to bowel motility Outcome: Not Progressing Goal: Will not experience complications related to urinary retention Outcome: Not Progressing   Problem: Pain Managment: Goal: General experience of comfort will improve and/or be controlled Outcome: Not Progressing   Problem: Safety: Goal: Ability to remain free from injury will improve Outcome: Not Progressing   Problem: Skin Integrity: Goal: Risk for impaired skin integrity will decrease Outcome: Not  Progressing

## 2024-08-01 DIAGNOSIS — G9341 Metabolic encephalopathy: Secondary | ICD-10-CM | POA: Diagnosis not present

## 2024-08-01 DIAGNOSIS — R4182 Altered mental status, unspecified: Secondary | ICD-10-CM

## 2024-08-01 DIAGNOSIS — R569 Unspecified convulsions: Secondary | ICD-10-CM

## 2024-08-01 DIAGNOSIS — J9602 Acute respiratory failure with hypercapnia: Secondary | ICD-10-CM | POA: Diagnosis not present

## 2024-08-01 DIAGNOSIS — J9601 Acute respiratory failure with hypoxia: Secondary | ICD-10-CM | POA: Diagnosis not present

## 2024-08-01 DIAGNOSIS — F14129 Cocaine abuse with intoxication, unspecified: Secondary | ICD-10-CM | POA: Diagnosis not present

## 2024-08-01 LAB — RENAL FUNCTION PANEL
Albumin: 3.3 g/dL — ABNORMAL LOW (ref 3.5–5.0)
Anion gap: 10 (ref 5–15)
BUN: 14 mg/dL (ref 6–20)
CO2: 25 mmol/L (ref 22–32)
Calcium: 8.8 mg/dL — ABNORMAL LOW (ref 8.9–10.3)
Chloride: 111 mmol/L (ref 98–111)
Creatinine, Ser: 0.75 mg/dL (ref 0.44–1.00)
GFR, Estimated: >60 mL/min
Glucose, Bld: 99 mg/dL (ref 70–99)
Phosphorus: 3 mg/dL (ref 2.5–4.6)
Potassium: 3.7 mmol/L (ref 3.5–5.1)
Sodium: 146 mmol/L — ABNORMAL HIGH (ref 135–145)

## 2024-08-01 LAB — GLUCOSE, CAPILLARY
Glucose-Capillary: 91 mg/dL (ref 70–99)
Glucose-Capillary: 77 mg/dL (ref 70–99)
Glucose-Capillary: 140 mg/dL — ABNORMAL HIGH (ref 70–99)
Glucose-Capillary: 120 mg/dL — ABNORMAL HIGH (ref 70–99)
Glucose-Capillary: 91 mg/dL (ref 70–99)
Glucose-Capillary: 102 mg/dL — ABNORMAL HIGH (ref 70–99)

## 2024-08-01 LAB — CBC
HCT: 36.7 % (ref 36.0–46.0)
Hemoglobin: 11.9 g/dL — ABNORMAL LOW (ref 12.0–15.0)
MCH: 30.5 pg (ref 26.0–34.0)
MCHC: 32.4 g/dL (ref 30.0–36.0)
MCV: 94.1 fL (ref 80.0–100.0)
Platelets: 158 K/uL (ref 150–400)
RBC: 3.9 MIL/uL (ref 3.87–5.11)
RDW: 13.2 % (ref 11.5–15.5)
WBC: 8.2 K/uL (ref 4.0–10.5)
nRBC: 0 % (ref 0.0–0.2)

## 2024-08-01 LAB — MAGNESIUM: Magnesium: 2.1 mg/dL (ref 1.7–2.4)

## 2024-08-01 NOTE — Progress Notes (Signed)
 PHARMACY CONSULT NOTE - ELECTROLYTES  Pharmacy Consult for Electrolyte Monitoring and Replacement   Recent Labs: Height: 5' 6 (167.6 cm) Weight: 79 kg (174 lb 2.6 oz) IBW/kg (Calculated) : 59.3 Estimated Creatinine Clearance: 85.3 mL/min (by C-G formula based on SCr of 0.75 mg/dL). Potassium (mmol/L)  Date Value  08/01/2024 3.7  10/15/2012 3.0 (L)   Magnesium  (mg/dL)  Date Value  98/91/7973 2.1  10/04/2011 1.4 (L)   Calcium  (mg/dL)  Date Value  98/91/7973 8.8 (L)   Calcium , Total (mg/dL)  Date Value  96/75/7985 8.3 (L)   Albumin (g/dL)  Date Value  98/91/7973 3.3 (L)  10/03/2023 4.0  10/15/2012 3.9   Phosphorus (mg/dL)  Date Value  98/91/7973 3.0   Sodium (mmol/L)  Date Value  08/01/2024 146 (H)  10/03/2023 139  10/15/2012 141   Corrected Ca: 9.6 mg/dL  Assessment  Andrea Sanford is a 54 y.o. female presenting with suspected drug overdose with possible baclofen  (UDS positive for cannabinoid and cocaine) requiring intubation and mechanical ventilation for airway protection. PMH significant for polysubstance abuse and bipolar disorder. Pharmacy has been consulted to monitor and replace electrolytes.  Diet: NPO MIVF: LR @ 75 mL/hr Pertinent medications: N/A  Goal of Therapy: Electrolytes WNL  Plan:  No supplementation needed at this time Check BMP, Mg, Phos with AM labs  Thank you for allowing pharmacy to be a part of this patient's care.  Belvie Macintosh, PharmD Candidate 08/01/2024 8:22 AM

## 2024-08-01 NOTE — Procedures (Signed)
 Patient Name: Andrea Sanford  MRN: [id number]  Epilepsy Attending: Arlin MALVA Krebs  Referring Physician/Provider: Shellia Inge BIRCH, NP  Date: 07/31/2024 Duration: 29.02 mins  Patient history: 54yo F with ams. EEG to evaluate for seizure  Level of alertness: comatose  AEDs during EEG study: GBP, Propofol   Technical aspects: This EEG study was done with scalp electrodes positioned according to the 10-20 International system of electrode placement. Electrical activity was reviewed with band pass filter of 1-70Hz , sensitivity of 7 uV/mm, display speed of 15mm/sec with a 60Hz  notched filter applied as appropriate. EEG data were recorded continuously and digitally stored.  Video monitoring was available and reviewed as appropriate.  Description: EEG showed near continuous generalized amplitude sharply contoured 3 to 6 Hz theta and delta slowing admixed with 1 to 2 seconds of generalized EEG attenuation.  Hyperventilation and photic stimulation were not performed.     ABNORMALITY - Continuous slow, generalized  IMPRESSION: This study is suggestive of generalized cerebral dysfunction (encephalopathy).  No seizures or epileptiform discharges were seen throughout the recording.  Rimas Gilham O Ashvin Adelson

## 2024-08-01 NOTE — Progress Notes (Addendum)
 "  NAME:  Andrea Sanford, MRN:  [id number], DOB:  1970/10/23, LOS: 2 ADMISSION DATE:  07/30/2024, CONSULTATION DATE:  07/30/2024 REFERRING MD:  Dr. Dicky,   CHIEF COMPLAINT:  Unresponsive, suspected drug overdose  COCAINE TOXICITY  Brief Pt Description / Synopsis:  54 year old female with past medical history significant for polysubstance abuse and bipolar disorder who presented after being found unresponsive in the setting of suspected drug overdose with possible baclofen  (UDS positive for cannabinoid and cocaine) requiring intubation and mechanical ventilation for airway protection.  History of Present Illness:  Andrea Sanford is a 54 year old female with a past medical history significant for polysubstance abuse, bipolar disorder, generalized anxiety disorder, hypertension, hyperlipidemia, diabetes mellitus type 2 who presents to [facility] ED on 07/30/2024 after being found unresponsive.  Patient is currently intubated and unable to contribute to history and no family is currently available, therefore history is extremely limited and is obtained from chart review.  Per ED and nursing notes, she [place] roadside via EMS after being found unresponsive with pinpoint pupils, was hypoxic along with respiratory arrest.  She was given Narcan  with improvement in respiratory status, however remained unresponsive.  EMS was told on scene that the patient's significant other had a similar event/overdose within the last hour as well.  There was concern that the patient could have taken Baclofen  as well.  No evidence of trauma.  Upon arrival to the ED she remained unresponsive with sonorous respirations for which she was intubated for airway protection.  ED Course: Initial Vital Signs: Temperature 95.1 F, respiratory rate 13, pulse 54, blood pressure 152/86, SpO2 100% on the vent Significant Labs: Potassium 3.1, glucose 139 ABG postintubation: pH 7.37/pCO2 40/pO2 129/bicarb 25.4 Imaging Chest  X-ray>>IMPRESSION: 1. Endotracheal tube tip projects 4 cm above the carina. 2. Enteric tube extends below the diaphragm, with the tip beyond the inferior margin of the image. 3. No acute cardiopulmonary abnormality. CT Head wo contrast>>IMPRESSION: 1. No acute intracranial abnormality. 2. Bilateral displaced nasal bone fractures of indeterminate age. 3. Stable right maxillary sinus polyps versus mucous retention cysts. CT Cervical Spine>>IMPRESSION: 1. No acute fracture or subluxation in the cervical spine. 2. Mild to moderate severity degenerative changes at the level of C5-C6.   PCCM asked to admit for further workup and treatment.   Pertinent  Medical History   Past Medical History:  Diagnosis Date   Allergy    Anxiety    Arthritis    Asthma    Bipolar 1 disorder (HCC)    Cancer (HCC)    COPD (chronic obstructive pulmonary disease) (HCC)    Depression    Diabetes mellitus without complication (HCC)    Drug abuse (HCC)    Hypertension    Neuromuscular disorder (HCC)    Osteoporosis    PTSD (post-traumatic stress disorder) 04/07/2024   Sleep apnea     Micro Data:  1/6: COVID/flu/RSV PCR>> 1/6: MRSA PCR>>  Antimicrobials:   Anti-infectives (From admission, onward)    None       Significant Hospital Events: Including procedures, antibiotic start and stop dates in addition to other pertinent events   1/6: Presented to ED unresponsive requiring intubation for airway protection.  PCCM asked to admit 1/7 remains encephalopathic 1/8 remains encephallopathic  Interim History / Subjective:  Remains critically ill Remains intubated Requires VENT support for survival   Vent Mode: PRVC FiO2 (%):  [28 %-40 %] 28 % Set Rate:  [18 bmp] 18 bmp Vt Set:  [450 mL] 450  mL PEEP:  [5 cmH20] 5 cmH20 Plateau Pressure:  [14 cmH20] 14 cmH20   Objective   Blood pressure 107/67, pulse 77, temperature 98.8 F (37.1 C), resp. rate 18, height 5' 6 (1.676 m), weight 79  kg, SpO2 95%.    Vent Mode: PRVC FiO2 (%):  [28 %-40 %] 28 % Set Rate:  [18 bmp] 18 bmp Vt Set:  [450 mL] 450 mL PEEP:  [5 cmH20] 5 cmH20 Plateau Pressure:  [14 cmH20] 14 cmH20   Intake/Output Summary (Last 24 hours) at 08/01/2024 0721 Last data filed at 08/01/2024 0600 Gross per 24 hour  Intake 2336.99 ml  Output 700 ml  Net 1636.99 ml   Filed Weights   07/30/24 1950 07/31/24 0500 08/01/24 0500  Weight: 78.7 kg 78.7 kg 79 kg     REVIEW OF SYSTEMS  PATIENT IS UNABLE TO PROVIDE COMPLETE REVIEW OF SYSTEMS DUE TO SEVERE CRITICAL ILLNESS   PHYSICAL EXAMINATION:  GENERAL:critically ill appearing, +resp distress MOUTH: Moist mucosal membrane. INTUBATED NECK: Supple.  PULMONARY: Lungs clear to auscultation, +rhonchi, +wheezing CARDIOVASCULAR: S1 and S2.  Regular rate and rhythm NEURO sedated   Assessment & Plan:   54 yo white female with severe metabolic encephalopathy from severe cocaine toxicity leading to inability to protect airway and very high risk for aspiration   NEUROLOGY ACUTE METABOLIC ENCEPHALOPATHY-COCAINE Unable to wean from vent due to severe encephalopathy  Severe ACUTE Hypoxic and Hypercapnic Respiratory Failure -continue Mechanical Ventilator support -Wean Fio2 and PEEP as tolerated -VAP/VENT bundle implementation - Wean PEEP & FiO2 as tolerated, maintain SpO2 > 88% - Head of bed elevated 30 degrees, VAP protocol in place - Plateau pressures less than 30 cm H20  - Intermittent chest x-ray & ABG PRN - Ensure adequate pulmonary hygiene  -will perform SAT/SBT when respiratory parameters are met Unable to wean due to severe encephalopathy    ENDO - ICU hypoglycemic\Hyperglycemia protocol -check FSBS per protocol   GI GI PROPHYLAXIS as indicated NUTRITIONAL STATUS DIET-->TF's as tolerated Constipation protocol as indicated   ELECTROLYTES -follow labs as needed -replace as needed -pharmacy consultation and following  RESTRICTIVE TRANSFUSION  PROTOCOL TRANSFUSION  IF HGB<7  or ACTIVE BLEEDING OR DX of ACUTE CORONARY SYNDROMES   Best Practice (right click and Reselect all SmartList Selections daily)   Diet/type: NPO DVT prophylaxis: LMWH GI prophylaxis: PPI Lines: N/A Foley:  Yes, and it is still needed Code Status:  full code Last date of multidisciplinary goals of care discussion [N/A]   Labs   CBC: Recent Labs  Lab 07/30/24 1634 07/31/24 0339 08/01/24 0350  WBC 9.9 12.3* 8.2  NEUTROABS 7.1  --   --   HGB 13.4 12.8 11.9*  HCT 40.2 40.1 36.7  MCV 92.2 94.1 94.1  PLT 182 172 158    Basic Metabolic Panel: Recent Labs  Lab 07/30/24 1634 07/31/24 0339 08/01/24 0350  NA 145 144 146*  K 3.1* 4.0 3.7  CL 106 110 111  CO2 27 24 25   GLUCOSE 139* 137* 99  BUN 23* 18 14  CREATININE 0.72 0.71 0.75  CALCIUM  9.2 9.4 8.8*  MG 1.9 2.0 2.1  PHOS 3.5 3.2 3.0   GFR: Estimated Creatinine Clearance: 85.3 mL/min (by C-G formula based on SCr of 0.75 mg/dL). Recent Labs  Lab 07/30/24 1634 07/30/24 1635 07/31/24 0339 08/01/24 0350  WBC 9.9  --  12.3* 8.2  LATICACIDVEN  --  0.6  --   --     Liver Function Tests: Recent Labs  Lab 07/30/24 1634 07/31/24 0339 08/01/24 0350  AST 20  --   --   ALT 11  --   --   ALKPHOS 110  --   --   BILITOT 0.6  --   --   PROT 6.7  --   --   ALBUMIN 4.3 3.8 3.3*   No results for input(s): LIPASE, AMYLASE in the last 168 hours. Recent Labs  Lab 07/30/24 1634  AMMONIA 31    ABG    Component Value Date/Time   PHART 7.37 07/30/2024 1636   PCO2ART 44 07/30/2024 1636   PO2ART 129 (H) 07/30/2024 1636   HCO3 25.4 07/30/2024 1636   ACIDBASEDEF 0.1 07/30/2024 1636   O2SAT 98.9 07/30/2024 1636     Coagulation Profile: Recent Labs  Lab 07/30/24 1634  INR 1.2    Cardiac Enzymes: No results for input(s): CKTOTAL, CKMB, CKMBINDEX, TROPONINI in the last 168 hours.  HbA1C: Hemoglobin A1C  Date/Time Value Ref Range Status  04/24/2024 11:08 AM 6.2 (A)  4.0 - 5.6 % Final  10/03/2023 09:43 AM 6.6 (A) 4.0 - 5.6 % Final   Hgb A1c MFr Bld  Date/Time Value Ref Range Status  09/14/2023 06:18 AM 6.8 (H) 4.8 - 5.6 % Final    Comment:    (NOTE) Pre diabetes:          5.7%-6.4%  Diabetes:              >6.4%  Glycemic control for   <7.0% adults with diabetes   06/13/2023 02:40 PM 6.3 (H) 4.8 - 5.6 % Final    Comment:             Prediabetes: 5.7 - 6.4          Diabetes: >6.4          Glycemic control for adults with diabetes: <7.0     CBG: Recent Labs  Lab 07/31/24 1138 07/31/24 1526 07/31/24 1922 07/31/24 2308 08/01/24 0321  GLUCAP 88 85 106* 110* 91    Review of Systems:   Unable to assess due to intubation, sedation, AMS   Past Medical History:  She,  has a past medical history of Allergy, Anxiety, Arthritis, Asthma, Bipolar 1 disorder (HCC), Cancer (HCC), COPD (chronic obstructive pulmonary disease) (HCC), Depression, Diabetes mellitus without complication (HCC), Drug abuse (HCC), Hypertension, Neuromuscular disorder (HCC), Osteoporosis, PTSD (post-traumatic stress disorder) (04/07/2024), and Sleep apnea.   Surgical History:   Past Surgical History:  Procedure Laterality Date   ANKLE SURGERY Right    IR GASTROSTOMY TUBE MOD SED  09/04/2023   IR GASTROSTOMY TUBE REMOVAL  12/13/2023   IR PATIENT EVAL TECH 0-60 MINS  11/03/2023   PERIPHERAL VASCULAR THROMBECTOMY Left 1991   TRACHEOSTOMY TUBE PLACEMENT N/A 09/01/2023   Procedure: TRACHEOSTOMY;  Surgeon: Blair Mt, MD;  Location: [facility] ORS;  Service: ENT;  Laterality: N/A;     Social History:   reports that she has been smoking cigarettes. She has been exposed to tobacco smoke. She has never used smokeless tobacco. She reports that she does not currently use alcohol after a past usage of about 2.0 standard drinks of alcohol per week. She reports current drug use. Drug: Cocaine.   Family History:  Her family history includes Cancer in her father; Depression in her mother;  Diabetes in her mother.   Allergies Allergies[1]   Home Medications  Prior to Admission medications  Medication Sig Start Date End Date Taking? Authorizing Provider  ACCU-CHEK GUIDE TEST test  strip 1 each by Other route 2 (two) times daily. 05/21/24   Ziglar, Susan K, MD  albuterol  (VENTOLIN  HFA) 108 (90 Base) MCG/ACT inhaler Inhale 2 puffs into the lungs every 6 (six) hours as needed for wheezing. 05/21/24   Ziglar, Susan K, MD  amLODipine  (NORVASC ) 5 MG tablet Take 1 tablet (5 mg total) by mouth daily. 05/21/24   Ziglar, Susan K, MD  Aspirin-Salicylamide-Caffeine  (ARTHRITIS STRENGTH BC POWDER PO) Take 3 packets by mouth every 6 (six) hours as needed (For pain).    [provider]  atorvastatin  (LIPITOR) 40 MG tablet Take 1 tablet (40 mg total) by mouth daily. 05/21/24   Ziglar, Susan K, MD  baclofen  (LIORESAL ) 10 MG tablet Take 1 tablet (10 mg total) by mouth 3 (three) times daily. 07/04/24   Ziglar, Susan K, MD  cetirizine  (ZYRTEC ) 10 MG tablet Take 1 tablet (10 mg total) by mouth daily. 06/07/24   Ziglar, Susan K, MD  doxycycline  (VIBRAMYCIN ) 100 MG capsule Take 1 capsule (100 mg total) by mouth 2 (two) times daily. 07/02/24   Ziglar, Susan K, MD  gabapentin  (NEURONTIN ) 300 MG capsule Take 2 capsules (600 mg total) by mouth 3 (three) times daily. 07/02/24   Ziglar, Susan K, MD  lactulose  (CHRONULAC ) 10 GM/15ML solution Take 15 mLs (10 g total) by mouth 2 (two) times daily. 04/11/24   Leigh Corean Massa, MD  lidocaine  (LIDODERM ) 5 % Place 1 patch onto the skin daily. Remove & Discard patch within 12 hours or as directed by MD 07/02/24   Ziglar, Susan K, MD  meloxicam  (MOBIC ) 15 MG tablet Take 1 tablet (15 mg total) by mouth daily. 07/02/24   Ziglar, Susan K, MD  montelukast  (SINGULAIR ) 10 MG tablet Take 1 tablet (10 mg total) by mouth at bedtime. 05/21/24   Ziglar, Susan K, MD  nicotine  (NICODERM CQ  - DOSED IN MG/24 HOURS) 21 mg/24hr patch Place 1 patch (21 mg total) onto the skin  daily. Patient not taking: Reported on 07/12/2024 04/12/24   Leigh Corean Massa, MD  predniSONE  (STERAPRED UNI-PAK 48 TAB) 10 MG (48) TBPK tablet Take by mouth daily. 12-day taper pack, use as directed for taper Patient not taking: Reported on 07/12/2024 05/13/24   Ziglar, Susan K, MD  QUEtiapine  (SEROQUEL ) 400 MG tablet Take 1 tablet (400 mg total) by mouth at bedtime. 07/02/24   Ziglar, Susan K, MD        DVT/GI PRX  assessed I Assessed the need for Labs I Assessed the need for Foley I Assessed the need for Central Venous Line Family Discussion when available I Assessed the need for Mobilization I made an Assessment of medications to be adjusted accordingly Safety Risk assessment completed  CASE DISCUSSED IN MULTIDISCIPLINARY ROUNDS WITH ICU TEAM     Critical Care Time devoted to patient care services described in this note is 45 minutes.  Critical care was necessary to treat /prevent imminent and life-threatening deterioration.   Nickolas Alm Cellar, M.D.  [facility]  Medical Director [facility]              [1] No Known Allergies  "

## 2024-08-02 ENCOUNTER — Telehealth: Payer: MEDICAID | Admitting: *Deleted

## 2024-08-02 DIAGNOSIS — J9602 Acute respiratory failure with hypercapnia: Secondary | ICD-10-CM | POA: Diagnosis not present

## 2024-08-02 DIAGNOSIS — F14129 Cocaine abuse with intoxication, unspecified: Secondary | ICD-10-CM | POA: Diagnosis not present

## 2024-08-02 DIAGNOSIS — J9601 Acute respiratory failure with hypoxia: Secondary | ICD-10-CM | POA: Diagnosis not present

## 2024-08-02 DIAGNOSIS — G9341 Metabolic encephalopathy: Secondary | ICD-10-CM | POA: Diagnosis not present

## 2024-08-02 LAB — CBC
HCT: 43.2 % (ref 36.0–46.0)
Hemoglobin: 13.3 g/dL (ref 12.0–15.0)
MCH: 30 pg (ref 26.0–34.0)
MCHC: 30.8 g/dL (ref 30.0–36.0)
MCV: 97.5 fL (ref 80.0–100.0)
Platelets: 160 K/uL (ref 150–400)
RBC: 4.43 MIL/uL (ref 3.87–5.11)
RDW: 13.2 % (ref 11.5–15.5)
WBC: 7.6 K/uL (ref 4.0–10.5)
nRBC: 0 % (ref 0.0–0.2)

## 2024-08-02 LAB — GLUCOSE, CAPILLARY
Glucose-Capillary: 108 mg/dL — ABNORMAL HIGH (ref 70–99)
Glucose-Capillary: 91 mg/dL (ref 70–99)
Glucose-Capillary: 100 mg/dL — ABNORMAL HIGH (ref 70–99)
Glucose-Capillary: 68 mg/dL — ABNORMAL LOW (ref 70–99)
Glucose-Capillary: 139 mg/dL — ABNORMAL HIGH (ref 70–99)
Glucose-Capillary: 127 mg/dL — ABNORMAL HIGH (ref 70–99)

## 2024-08-02 LAB — RENAL FUNCTION PANEL
Albumin: 3.5 g/dL (ref 3.5–5.0)
Anion gap: 10 (ref 5–15)
BUN: 15 mg/dL (ref 6–20)
CO2: 26 mmol/L (ref 22–32)
Calcium: 8.8 mg/dL — ABNORMAL LOW (ref 8.9–10.3)
Chloride: 109 mmol/L (ref 98–111)
Creatinine, Ser: 0.78 mg/dL (ref 0.44–1.00)
GFR, Estimated: >60 mL/min
Glucose, Bld: 121 mg/dL — ABNORMAL HIGH (ref 70–99)
Phosphorus: 3.8 mg/dL (ref 2.5–4.6)
Potassium: 3.7 mmol/L (ref 3.5–5.1)
Sodium: 145 mmol/L (ref 135–145)

## 2024-08-02 LAB — MAGNESIUM: Magnesium: 2.2 mg/dL (ref 1.7–2.4)

## 2024-08-02 MED ORDER — CLONIDINE HCL 0.1 MG PO TABS
0.1000 mg | ORAL_TABLET | Freq: Three times a day (TID) | ORAL | Status: DC
  Administered 2024-08-02 – 2024-08-06 (×7): 0.1 mg
  Filled 2024-08-02 (×9): qty 1

## 2024-08-02 NOTE — Plan of Care (Signed)
" °  Problem: Respiratory: Goal: Ability to maintain a clear airway and adequate ventilation will improve Outcome: Progressing   Problem: Role Relationship: Goal: Method of communication will improve Outcome: Not Progressing   Problem: Education: Goal: Knowledge of General Education information will improve Description: Including pain rating scale, medication(s)/side effects and non-pharmacologic comfort measures Outcome: Not Progressing   Problem: Clinical Measurements: Goal: Ability to maintain clinical measurements within normal limits will improve Outcome: Progressing Goal: Cardiovascular complication will be avoided Outcome: Progressing   Problem: Nutrition: Goal: Adequate nutrition will be maintained Outcome: Progressing   Problem: Coping: Goal: Level of anxiety will decrease Outcome: Progressing   "

## 2024-08-02 NOTE — Progress Notes (Signed)
 Removed 6 silver colored rings from patients fingers d/t swelling.  Placed in clear cup with patient chart sticker and placed on computer at bedside.

## 2024-08-02 NOTE — TOC Progression Note (Signed)
 Transition of Care [facility]) - Progression Note    Patient Details  Name: Andrea Sanford MRN: [id number] Date of Birth: 05-22-1971  [facility]) CM/SW Contact  Corrie JINNY Ruts, LCSW Phone Number: 08/02/2024, 3:48 PM  Clinical Narrative:    Chart reviewed. The patient is intubated. I was able to speak with the patient brother who is listed as the emergancy contact for the patient. The patient was unable to answer the readmission preventative screening questions.   The patient brother reports that the patient is considered homeless and bounces around. The patient brother reports that the patient was living with their mother. The patient brother reports that the patient mother would know more about the patient than he would.   The patient brother reports that the patient mother would like to get the patient committed in to in psychiatric hospital. The patient brother reports that the mother is scared of the patient due to negative behaviors and reactions. The patient brother reports that the mothers landlord said the patient can not come back to the property. The patient brother reports that the patient hangs around other homeless people. The patient brother reports that the patient will most likely leave the hospital AMA when she is better.  The patient brother had concerns of the patient needing more mental health support. The patient brother reports he would like to see if the patient can get help through his church. The patient brother reports that he has no other questions or concerns during the call.                      Expected Discharge Plan and Services                                               Social Drivers of Health (SDOH) Interventions SDOH Screenings   Food Insecurity: Patient Unable To Answer (07/30/2024)  Housing: Patient Unable To Answer (07/30/2024)  Transportation Needs: Patient Unable To Answer (07/30/2024)  Utilities: Patient Unable To  Answer (07/30/2024)  Alcohol Screen: Low Risk (06/13/2023)  Depression (PHQ2-9): High Risk (05/21/2024)  Tobacco Use: High Risk (06/28/2024)    Readmission Risk Interventions     No data to display

## 2024-08-02 NOTE — Progress Notes (Signed)
 PHARMACY CONSULT NOTE - ELECTROLYTES  Pharmacy Consult for Electrolyte Monitoring and Replacement   Recent Labs: Height: 5' 6 (167.6 cm) Weight: 82.1 kg (181 lb) IBW/kg (Calculated) : 59.3 Estimated Creatinine Clearance: 86.8 mL/min (by C-G formula based on SCr of 0.78 mg/dL). Potassium (mmol/L)  Date Value  08/02/2024 3.7  10/15/2012 3.0 (L)   Magnesium  (mg/dL)  Date Value  98/90/7973 2.2  10/04/2011 1.4 (L)   Calcium  (mg/dL)  Date Value  98/90/7973 8.8 (L)   Calcium , Total (mg/dL)  Date Value  96/75/7985 8.3 (L)   Albumin (g/dL)  Date Value  98/90/7973 3.5  10/03/2023 4.0  10/15/2012 3.9   Phosphorus (mg/dL)  Date Value  98/90/7973 3.8   Sodium (mmol/L)  Date Value  08/02/2024 145  10/03/2023 139  10/15/2012 141   Corrected Ca: N/A, albumin WNL  Assessment  Andrea Sanford is a 54 y.o. female presenting with suspected drug overdose with possible baclofen  (UDS positive for cannabinoid and cocaine) requiring intubation and mechanical ventilation for airway protection. PMH significant for polysubstance abuse and bipolar disorder. Pharmacy has been consulted to monitor and replace electrolytes.  Diet: NPO MIVF: None Pertinent medications: N/A  Goal of Therapy: Electrolytes WNL  Plan:  No supplementation needed at this time Check BMP, Mg, Phos with AM labs  Thank you for allowing pharmacy to be a part of this patient's care.  Belvie Macintosh, PharmD Candidate 08/02/2024 7:43 AM

## 2024-08-02 NOTE — Progress Notes (Signed)
 "  NAME:  Andrea Sanford, MRN:  [id number], DOB:  [birth year], LOS: 3 ADMISSION DATE:  07/30/2024, CONSULTATION DATE:  07/30/2024 REFERRING MD:  Dr. Dicky,   CHIEF COMPLAINT:  Unresponsive, suspected drug overdose  COCAINE TOXICITY  Brief Pt Description / Synopsis:  54 year old female with past medical history significant for polysubstance abuse and bipolar disorder who presented after being found unresponsive in the setting of suspected drug overdose with possible baclofen  (UDS positive for cannabinoid and cocaine) requiring intubation and mechanical ventilation for airway protection.  History of Present Illness:  [name] is a 54 year old female with a past medical history significant for polysubstance abuse, bipolar disorder, generalized anxiety disorder, hypertension, hyperlipidemia, diabetes mellitus type 2 who presents to [facility] ED on 07/30/2024 after being found unresponsive.  Patient is currently intubated and unable to contribute to history and no family is currently available, therefore history is extremely limited and is obtained from chart review.  Per ED and nursing notes, she [place] road[place] after being found unresponsive with pinpoint pupils, was hypoxic along with respiratory arrest.  She was given Narcan  with improvement in respiratory status, however remained unresponsive.  EMS was told on scene that the patient's significant other had a similar event/overdose within the last hour as well.  There was concern that the patient could have taken Baclofen  as well.  No evidence of trauma.  Upon arrival to the ED she remained unresponsive with sonorous respirations for which she was intubated for airway protection.  ED Course: Initial Vital Signs: Temperature 95.1 F, respiratory rate 13, pulse 54, blood pressure 152/86, SpO2 100% on the vent Significant Labs: Potassium 3.1, glucose 139 ABG postintubation: pH 7.37/pCO2 40/pO2 129/bicarb 25.4 Imaging Chest  X-ray>>IMPRESSION: 1. Endotracheal tube tip projects 4 cm above the carina. 2. Enteric tube extends below the diaphragm, with the tip beyond the inferior margin of the image. 3. No acute cardiopulmonary abnormality. CT Head wo contrast>>IMPRESSION: 1. No acute intracranial abnormality. 2. Bilateral displaced nasal bone fractures of indeterminate age. 3. Stable right maxillary sinus polyps versus mucous retention cysts. CT Cervical Spine>>IMPRESSION: 1. No acute fracture or subluxation in the cervical spine. 2. Mild to moderate severity degenerative changes at the level of C5-C6.   PCCM asked to admit for further workup and treatment.   Pertinent  Medical History   Past Medical History:  Diagnosis Date   Allergy    Anxiety    Arthritis    Asthma    Bipolar 1 disorder (HCC)    Cancer (HCC)    COPD (chronic obstructive pulmonary disease) (HCC)    Depression    Diabetes mellitus without complication (HCC)    Drug abuse (HCC)    Hypertension    Neuromuscular disorder (HCC)    Osteoporosis    PTSD (post-traumatic stress disorder) 04/07/2024   Sleep apnea     Micro Data:  1/6: COVID/flu/RSV PCR>> 1/6: MRSA PCR>>  Antimicrobials:   Anti-infectives (From admission, onward)    None       Significant Hospital Events: Including procedures, antibiotic start and stop dates in addition to other pertinent events   1/6: Presented to ED unresponsive requiring intubation for airway protection.  PCCM asked to admit 1/7 remains encephalopathic 1/8 remains encephalopathic 1/9 remains encephalopathic  Interim History / Subjective:  Remains critically ill Remains intubated Requires VENT support for survival  Vent Mode: PRVC FiO2 (%):  [28 %] 28 % Set Rate:  [18 bmp] 18 bmp Vt Set:  [450 mL]  450 mL PEEP:  [5 cmH20] 5 cmH20   Objective   Blood pressure 90/63, pulse 66, temperature 98.1 F (36.7 C), temperature source Oral, resp. rate 18, height 5' 6 (1.676 m), weight  82.1 kg, SpO2 99%.    Vent Mode: PRVC FiO2 (%):  [28 %] 28 % Set Rate:  [18 bmp] 18 bmp Vt Set:  [450 mL] 450 mL PEEP:  [5 cmH20] 5 cmH20   Intake/Output Summary (Last 24 hours) at 08/02/2024 0813 Last data filed at 08/02/2024 0756 Gross per 24 hour  Intake 2988.11 ml  Output 1525 ml  Net 1463.11 ml   Filed Weights   07/31/24 0500 08/01/24 0500 08/02/24 0342  Weight: 78.7 kg 79 kg 82.1 kg     REVIEW OF SYSTEMS  PATIENT IS UNABLE TO PROVIDE COMPLETE REVIEW OF SYSTEMS DUE TO SEVERE CRITICAL ILLNESS   Intubated,sedated No wheezing Soft ND No edema   Assessment & Plan:   54 yo white female with severe metabolic encephalopathy from severe cocaine toxicity leading to inability to protect airway and very high risk for aspiration   NEUROLOGY ACUTE METABOLIC ENCEPHALOPATHY-COCAINE TOXICITY Unable to wean from vent due to severe encephalopathy  Severe ACUTE Hypoxic Failure -continue Mechanical Ventilator support -Wean Fio2 and PEEP as tolerated -VAP/VENT bundle implementation - Wean PEEP & FiO2 as tolerated, maintain SpO2 > 88% - Head of bed elevated 30 degrees, VAP protocol in place - Plateau pressures less than 30 cm H20  - Intermittent chest x-ray & ABG PRN - Ensure adequate pulmonary hygiene  Unable to wean due to severe encephalopathy    ENDO - ICU hypoglycemic\Hyperglycemia protocol -check FSBS per protocol   GI GI PROPHYLAXIS as indicated NUTRITIONAL STATUS DIET-->TF's as tolerated Constipation protocol as indicated   ELECTROLYTES -follow labs as needed -replace as needed -pharmacy consultation and following  RESTRICTIVE TRANSFUSION PROTOCOL TRANSFUSION  IF HGB<7  or ACTIVE BLEEDING OR DX of ACUTE CORONARY SYNDROMES     Best Practice (right click and Reselect all SmartList Selections daily)   Diet/type: NPO DVT prophylaxis: LMWH GI prophylaxis: PPI Lines: N/A Foley:  Yes, and it is still needed Code Status:  full code Last date of  multidisciplinary goals of care discussion [N/A]   Labs   CBC: Recent Labs  Lab 07/30/24 1634 07/31/24 0339 08/01/24 0350 08/02/24 0334  WBC 9.9 12.3* 8.2 7.6  NEUTROABS 7.1  --   --   --   HGB 13.4 12.8 11.9* 13.3  HCT 40.2 40.1 36.7 43.2  MCV 92.2 94.1 94.1 97.5  PLT 182 172 158 160    Basic Metabolic Panel: Recent Labs  Lab 07/30/24 1634 07/31/24 0339 08/01/24 0350 08/02/24 0334  NA 145 144 146* 145  K 3.1* 4.0 3.7 3.7  CL 106 110 111 109  CO2 27 24 25 26   GLUCOSE 139* 137* 99 121*  BUN 23* 18 14 15   CREATININE 0.72 0.71 0.75 0.78  CALCIUM  9.2 9.4 8.8* 8.8*  MG 1.9 2.0 2.1 2.2  PHOS 3.5 3.2 3.0 3.8   GFR: Estimated Creatinine Clearance: 86.8 mL/min (by C-G formula based on SCr of 0.78 mg/dL). Recent Labs  Lab 07/30/24 1634 07/30/24 1635 07/31/24 0339 08/01/24 0350 08/02/24 0334  WBC 9.9  --  12.3* 8.2 7.6  LATICACIDVEN  --  0.6  --   --   --     Liver Function Tests: Recent Labs  Lab 07/30/24 1634 07/31/24 0339 08/01/24 0350 08/02/24 0334  AST 20  --   --   --  ALT 11  --   --   --   ALKPHOS 110  --   --   --   BILITOT 0.6  --   --   --   PROT 6.7  --   --   --   ALBUMIN 4.3 3.8 3.3* 3.5   No results for input(s): LIPASE, AMYLASE in the last 168 hours. Recent Labs  Lab 07/30/24 1634  AMMONIA 31    ABG    Component Value Date/Time   PHART 7.37 07/30/2024 1636   PCO2ART 44 07/30/2024 1636   PO2ART 129 (H) 07/30/2024 1636   HCO3 25.4 07/30/2024 1636   ACIDBASEDEF 0.1 07/30/2024 1636   O2SAT 98.9 07/30/2024 1636     Coagulation Profile: Recent Labs  Lab 07/30/24 1634  INR 1.2    Cardiac Enzymes: No results for input(s): CKTOTAL, CKMB, CKMBINDEX, TROPONINI in the last 168 hours.  HbA1C: Hemoglobin A1C  Date/Time Value Ref Range Status  04/24/2024 11:08 AM 6.2 (A) 4.0 - 5.6 % Final  10/03/2023 09:43 AM 6.6 (A) 4.0 - 5.6 % Final   Hgb A1c MFr Bld  Date/Time Value Ref Range Status  09/14/2023 06:18 AM 6.8 (H)  4.8 - 5.6 % Final    Comment:    (NOTE) Pre diabetes:          5.7%-6.4%  Diabetes:              >6.4%  Glycemic control for   <7.0% adults with diabetes   06/13/2023 02:40 PM 6.3 (H) 4.8 - 5.6 % Final    Comment:             Prediabetes: 5.7 - 6.4          Diabetes: >6.4          Glycemic control for adults with diabetes: <7.0     CBG: Recent Labs  Lab 08/01/24 1629 08/01/24 1946 08/01/24 2317 08/02/24 0354 08/02/24 0725  GLUCAP 120* 91 102* 108* 91    Review of Systems:   Unable to assess due to intubation, sedation, AMS   Past Medical History:  She,  has a past medical history of Allergy, Anxiety, Arthritis, Asthma, Bipolar 1 disorder (HCC), Cancer (HCC), COPD (chronic obstructive pulmonary disease) (HCC), Depression, Diabetes mellitus without complication (HCC), Drug abuse (HCC), Hypertension, Neuromuscular disorder (HCC), Osteoporosis, PTSD (post-traumatic stress disorder) (04/07/2024), and Sleep apnea.   Surgical History:   Past Surgical History:  Procedure Laterality Date   ANKLE SURGERY Right    IR GASTROSTOMY TUBE MOD SED  09/04/2023   IR GASTROSTOMY TUBE REMOVAL  12/13/2023   IR PATIENT EVAL TECH 0-60 MINS  11/03/2023   PERIPHERAL VASCULAR THROMBECTOMY Left 1991   TRACHEOSTOMY TUBE PLACEMENT N/A 09/01/2023   Procedure: TRACHEOSTOMY;  Surgeon: Blair Mt, MD;  Location: [facility] ORS;  Service: ENT;  Laterality: N/A;     Social History:   reports that she has been smoking cigarettes. She has been exposed to tobacco smoke. She has never used smokeless tobacco. She reports that she does not currently use alcohol after a past usage of about 2.0 standard drinks of alcohol per week. She reports current drug use. Drug: Cocaine.   Family History:  Her family history includes Cancer in her father; Depression in her mother; Diabetes in her mother.   Allergies Allergies[1]   Home Medications  Prior to Admission medications  Medication Sig Start Date End Date  Taking? Authorizing Provider  ACCU-CHEK GUIDE TEST test strip 1 each by  Other route 2 (two) times daily. 05/21/24   Ziglar, Susan K, MD  albuterol  (VENTOLIN  HFA) 108 (90 Base) MCG/ACT inhaler Inhale 2 puffs into the lungs every 6 (six) hours as needed for wheezing. 05/21/24   Ziglar, Susan K, MD  amLODipine  (NORVASC ) 5 MG tablet Take 1 tablet (5 mg total) by mouth daily. 05/21/24   Ziglar, Susan K, MD  Aspirin-Salicylamide-Caffeine  (ARTHRITIS STRENGTH BC POWDER PO) Take 3 packets by mouth every 6 (six) hours as needed (For pain).    [provider]  atorvastatin  (LIPITOR) 40 MG tablet Take 1 tablet (40 mg total) by mouth daily. 05/21/24   Ziglar, Susan K, MD  baclofen  (LIORESAL ) 10 MG tablet Take 1 tablet (10 mg total) by mouth 3 (three) times daily. 07/04/24   Ziglar, Susan K, MD  cetirizine  (ZYRTEC ) 10 MG tablet Take 1 tablet (10 mg total) by mouth daily. 06/07/24   Ziglar, Susan K, MD  doxycycline  (VIBRAMYCIN ) 100 MG capsule Take 1 capsule (100 mg total) by mouth 2 (two) times daily. 07/02/24   Ziglar, Susan K, MD  gabapentin  (NEURONTIN ) 300 MG capsule Take 2 capsules (600 mg total) by mouth 3 (three) times daily. 07/02/24   Ziglar, Susan K, MD  lactulose  (CHRONULAC ) 10 GM/15ML solution Take 15 mLs (10 g total) by mouth 2 (two) times daily. 04/11/24   Leigh Corean Massa, MD  lidocaine  (LIDODERM ) 5 % Place 1 patch onto the skin daily. Remove & Discard patch within 12 hours or as directed by MD 07/02/24   Ziglar, Susan K, MD  meloxicam  (MOBIC ) 15 MG tablet Take 1 tablet (15 mg total) by mouth daily. 07/02/24   Ziglar, Susan K, MD  montelukast  (SINGULAIR ) 10 MG tablet Take 1 tablet (10 mg total) by mouth at bedtime. 05/21/24   Ziglar, Susan K, MD  nicotine  (NICODERM CQ  - DOSED IN MG/24 HOURS) 21 mg/24hr patch Place 1 patch (21 mg total) onto the skin daily. Patient not taking: Reported on 07/12/2024 04/12/24   Leigh Corean Massa, MD  predniSONE  (STERAPRED UNI-PAK 48 TAB) 10 MG (48) TBPK  tablet Take by mouth daily. 12-day taper pack, use as directed for taper Patient not taking: Reported on 07/12/2024 05/13/24   Ziglar, Susan K, MD  QUEtiapine  (SEROQUEL ) 400 MG tablet Take 1 tablet (400 mg total) by mouth at bedtime. 07/02/24   Ziglar, Susan K, MD         DVT/GI PRX  assessed I Assessed the need for Labs I Assessed the need for Foley I Assessed the need for Central Venous Line Family Discussion when available I Assessed the need for Mobilization I made an Assessment of medications to be adjusted accordingly Safety Risk assessment completed  CASE DISCUSSED IN MULTIDISCIPLINARY ROUNDS WITH ICU TEAM     Critical Care Time devoted to patient care services described in this note is 35 minutes.     Nickolas Alm Cellar, M.D.  [facility]  Medical Director [facility] [place]               [1] No Known Allergies  "

## 2024-08-02 NOTE — Plan of Care (Signed)
  Problem: Activity: Goal: Ability to tolerate increased activity will improve Outcome: Progressing   Problem: Respiratory: Goal: Ability to maintain a clear airway and adequate ventilation will improve Outcome: Progressing   Problem: Role Relationship: Goal: Method of communication will improve Outcome: Progressing   Problem: Education: Goal: Knowledge of General Education information will improve Description: Including pain rating scale, medication(s)/side effects and non-pharmacologic comfort measures Outcome: Progressing   Problem: Health Behavior/Discharge Planning: Goal: Ability to manage health-related needs will improve Outcome: Progressing   Problem: Clinical Measurements: Goal: Ability to maintain clinical measurements within normal limits will improve Outcome: Progressing Goal: Will remain free from infection Outcome: Progressing Goal: Diagnostic test results will improve Outcome: Progressing Goal: Respiratory complications will improve Outcome: Progressing Goal: Cardiovascular complication will be avoided Outcome: Progressing   Problem: Activity: Goal: Risk for activity intolerance will decrease Outcome: Progressing   Problem: Nutrition: Goal: Adequate nutrition will be maintained Outcome: Progressing   Problem: Coping: Goal: Level of anxiety will decrease Outcome: Progressing   Problem: Elimination: Goal: Will not experience complications related to bowel motility Outcome: Progressing Goal: Will not experience complications related to urinary retention Outcome: Progressing   Problem: Pain Managment: Goal: General experience of comfort will improve and/or be controlled Outcome: Progressing   Problem: Safety: Goal: Ability to remain free from injury will improve Outcome: Progressing   Problem: Skin Integrity: Goal: Risk for impaired skin integrity will decrease Outcome: Progressing

## 2024-08-03 DIAGNOSIS — F14129 Cocaine abuse with intoxication, unspecified: Secondary | ICD-10-CM | POA: Diagnosis not present

## 2024-08-03 DIAGNOSIS — J9601 Acute respiratory failure with hypoxia: Secondary | ICD-10-CM | POA: Diagnosis not present

## 2024-08-03 DIAGNOSIS — G9341 Metabolic encephalopathy: Secondary | ICD-10-CM | POA: Diagnosis not present

## 2024-08-03 DIAGNOSIS — J9602 Acute respiratory failure with hypercapnia: Secondary | ICD-10-CM | POA: Diagnosis not present

## 2024-08-03 LAB — CBC
HCT: 36.8 % (ref 36.0–46.0)
Hemoglobin: 11.5 g/dL — ABNORMAL LOW (ref 12.0–15.0)
MCH: 29.7 pg (ref 26.0–34.0)
MCHC: 31.3 g/dL (ref 30.0–36.0)
MCV: 95.1 fL (ref 80.0–100.0)
Platelets: 170 K/uL (ref 150–400)
RBC: 3.87 MIL/uL (ref 3.87–5.11)
RDW: 13.2 % (ref 11.5–15.5)
WBC: 6.2 K/uL (ref 4.0–10.5)
nRBC: 0 % (ref 0.0–0.2)

## 2024-08-03 LAB — RENAL FUNCTION PANEL
Albumin: 3.4 g/dL — ABNORMAL LOW (ref 3.5–5.0)
Anion gap: 8 (ref 5–15)
BUN: 26 mg/dL — ABNORMAL HIGH (ref 6–20)
CO2: 27 mmol/L (ref 22–32)
Calcium: 8.9 mg/dL (ref 8.9–10.3)
Chloride: 109 mmol/L (ref 98–111)
Creatinine, Ser: 0.69 mg/dL (ref 0.44–1.00)
GFR, Estimated: >60 mL/min
Glucose, Bld: 150 mg/dL — ABNORMAL HIGH (ref 70–99)
Phosphorus: 4.4 mg/dL (ref 2.5–4.6)
Potassium: 3.9 mmol/L (ref 3.5–5.1)
Sodium: 143 mmol/L (ref 135–145)

## 2024-08-03 LAB — GLUCOSE, CAPILLARY
Glucose-Capillary: 182 mg/dL — ABNORMAL HIGH (ref 70–99)
Glucose-Capillary: 119 mg/dL — ABNORMAL HIGH (ref 70–99)
Glucose-Capillary: 101 mg/dL — ABNORMAL HIGH (ref 70–99)
Glucose-Capillary: 91 mg/dL (ref 70–99)
Glucose-Capillary: 132 mg/dL — ABNORMAL HIGH (ref 70–99)

## 2024-08-03 LAB — MAGNESIUM: Magnesium: 2.2 mg/dL (ref 1.7–2.4)

## 2024-08-03 MED ORDER — SODIUM CHLORIDE 0.9 % IV BOLUS
1000.0000 mL | Freq: Once | INTRAVENOUS | Status: AC
  Administered 2024-08-03: 1000 mL via INTRAVENOUS

## 2024-08-03 NOTE — Plan of Care (Signed)
" °  Problem: Respiratory: Goal: Ability to maintain a clear airway and adequate ventilation will improve Outcome: Progressing   Problem: Role Relationship: Goal: Method of communication will improve Outcome: Not Progressing   Problem: Education: Goal: Knowledge of General Education information will improve Description: Including pain rating scale, medication(s)/side effects and non-pharmacologic comfort measures Outcome: Not Progressing   Problem: Clinical Measurements: Goal: Ability to maintain clinical measurements within normal limits will improve Outcome: Progressing Goal: Diagnostic test results will improve Outcome: Progressing Goal: Respiratory complications will improve Outcome: Progressing Goal: Cardiovascular complication will be avoided Outcome: Progressing   Problem: Nutrition: Goal: Adequate nutrition will be maintained Outcome: Progressing   Problem: Coping: Goal: Level of anxiety will decrease Outcome: Progressing   "

## 2024-08-03 NOTE — Progress Notes (Addendum)
 Patient's B/P 80/50 (61) on repeat low value, provider notified NS 1L bolus ordered to be administered.

## 2024-08-03 NOTE — Progress Notes (Signed)
 "  NAME:  Andrea Sanford, MRN:  [id number], DOB:  11-28-1970, LOS: 4 ADMISSION DATE:  07/30/2024, CONSULTATION DATE:  07/30/2024 REFERRING MD:  Dr. Dicky,   CHIEF COMPLAINT:  Unresponsive, suspected drug overdose  COCAINE TOXICITY  Brief Pt Description / Synopsis:  54 year old female with past medical history significant for polysubstance abuse and bipolar disorder who presented after being found unresponsive in the setting of suspected drug overdose with possible baclofen  (UDS positive for cannabinoid and cocaine) requiring intubation and mechanical ventilation for airway protection.  History of Present Illness:  Andrea Sanford is a 54 year old female with a past medical history significant for polysubstance abuse, bipolar disorder, generalized anxiety disorder, hypertension, hyperlipidemia, diabetes mellitus type 2 who presents to [facility] ED on 07/30/2024 after being found unresponsive.  Patient is currently intubated and unable to contribute to history and no family is currently available, therefore history is extremely limited and is obtained from chart review.  Per ED and nursing notes, [place] road[place] after being found unresponsive with pinpoint pupils, was hypoxic along with respiratory arrest.  She was given Narcan  with improvement in respiratory status, however remained unresponsive.  EMS was told on scene that the patient's significant other had a similar event/overdose within the last hour as well.  There was concern that the patient could have taken Baclofen  as well.  No evidence of trauma.  Upon arrival to the ED she remained unresponsive with sonorous respirations for which she was intubated for airway protection.  Chest X-ray>>IMPRESSION: 1. Endotracheal tube tip projects 4 cm above the carina. 2. Enteric tube extends below the diaphragm, with the tip beyond the inferior margin of the image. 3. No acute cardiopulmonary abnormality. CT Head wo  contrast>>IMPRESSION: 1. No acute intracranial abnormality. 2. Bilateral displaced nasal bone fractures of indeterminate age. 3. Stable right maxillary sinus polyps versus mucous retention cysts. CT Cervical Spine>>IMPRESSION: 1. No acute fracture or subluxation in the cervical spine. 2. Mild to moderate severity degenerative changes at the level of C5-C6.   PCCM asked to admit for further workup and treatment.   Pertinent  Medical History   Past Medical History:  Diagnosis Date   Allergy    Anxiety    Arthritis    Asthma    Bipolar 1 disorder (HCC)    Cancer (HCC)    COPD (chronic obstructive pulmonary disease) (HCC)    Depression    Diabetes mellitus without complication (HCC)    Drug abuse (HCC)    Hypertension    Neuromuscular disorder (HCC)    Osteoporosis    PTSD (post-traumatic stress disorder) 04/07/2024   Sleep apnea     Micro Data:  1/6: COVID/flu/RSV PCR>> 1/6: MRSA PCR>>  Antimicrobials:   Anti-infectives (From admission, onward)    None       Significant Hospital Events: Including procedures, antibiotic start and stop dates in addition to other pertinent events   1/6: Presented to ED unresponsive requiring intubation for airway protection.  PCCM asked to admit 1/7 remains encephalopathic 1/8 remains encephalopathic 1/9 remains encephalopathic 1/10 remains encephalopathic  Interim History / Subjective:  Remains critically ill Remains intubated Requires VENT support for survival   Vent Mode: PRVC FiO2 (%):  [28 %] 28 % Set Rate:  [18 bmp] 18 bmp Vt Set:  [450 mL] 450 mL PEEP:  [5 cmH20] 5 cmH20 Plateau Pressure:  [13 cmH20-18 cmH20] 13 cmH20   Objective   Blood pressure 125/71, pulse 81, temperature 98.4 F (36.9 C),  temperature source Axillary, resp. rate 18, height 5' 6 (1.676 m), weight 83.2 kg, SpO2 96%.    Vent Mode: PRVC FiO2 (%):  [28 %] 28 % Set Rate:  [18 bmp] 18 bmp Vt Set:  [450 mL] 450 mL PEEP:  [5 cmH20] 5  cmH20 Plateau Pressure:  [13 cmH20-18 cmH20] 13 cmH20   Intake/Output Summary (Last 24 hours) at 08/03/2024 0739 Last data filed at 08/03/2024 0700 Gross per 24 hour  Intake 2137.63 ml  Output 1200 ml  Net 937.63 ml   Filed Weights   08/01/24 0500 08/02/24 0342 08/03/24 0357  Weight: 79 kg 82.1 kg 83.2 kg     REVIEW OF SYSTEMS  PATIENT IS UNABLE TO PROVIDE COMPLETE REVIEW OF SYSTEMS DUE TO SEVERE CRITICAL ILLNESS   Intubated,sedated No wheezing Soft ND No edema   Assessment & Plan:   54 yo white female with severe metabolic encephalopathy from severe cocaine toxicity leading to inability to protect airway and very high risk for aspiration   NEUROLOGY ACUTE METABOLIC ENCEPHALOPATHY-COCAINE TOXICITY Unable to wean from vent due to severe encephalopathy continue Mechanical Ventilator support -Wean Fio2 and PEEP as tolerated -VAP/VENT bundle implementation - Wean PEEP & FiO2 as tolerated, maintain SpO2 > 88% - Head of bed elevated 30 degrees, VAP protocol in place - Plateau pressures less than 30 cm H20  - Intermittent chest x-ray & ABG PRN - Ensure adequate pulmonary hygiene  Unable to wean due to severe encephalopathy    ENDO - ICU hypoglycemic\Hyperglycemia protocol -check FSBS per protocol   GI GI PROPHYLAXIS as indicated NUTRITIONAL STATUS DIET-->TF's as tolerated Constipation protocol as indicated   ELECTROLYTES -follow labs as needed -replace as needed -pharmacy consultation and following  RESTRICTIVE TRANSFUSION PROTOCOL TRANSFUSION  IF HGB<7  or ACTIVE BLEEDING OR DX of ACUTE CORONARY SYNDROMES    Best Practice (right click and Reselect all SmartList Selections daily)   Diet/type: NPO DVT prophylaxis: LMWH GI prophylaxis: PPI Lines: N/A Foley:  Yes, and it is still needed Code Status:  full code Last date of multidisciplinary goals of care discussion [N/A]   Labs   CBC: Recent Labs  Lab 07/30/24 1634 07/31/24 0339 08/01/24 0350  08/02/24 0334 08/03/24 0404  WBC 9.9 12.3* 8.2 7.6 6.2  NEUTROABS 7.1  --   --   --   --   HGB 13.4 12.8 11.9* 13.3 11.5*  HCT 40.2 40.1 36.7 43.2 36.8  MCV 92.2 94.1 94.1 97.5 95.1  PLT 182 172 158 160 170    Basic Metabolic Panel: Recent Labs  Lab 07/30/24 1634 07/31/24 0339 08/01/24 0350 08/02/24 0334 08/03/24 0404  NA 145 144 146* 145 143  K 3.1* 4.0 3.7 3.7 3.9  CL 106 110 111 109 109  CO2 27 24 25 26 27   GLUCOSE 139* 137* 99 121* 150*  BUN 23* 18 14 15  26*  CREATININE 0.72 0.71 0.75 0.78 0.69  CALCIUM  9.2 9.4 8.8* 8.8* 8.9  MG 1.9 2.0 2.1 2.2 2.2  PHOS 3.5 3.2 3.0 3.8 4.4   GFR: Estimated Creatinine Clearance: 87.4 mL/min (by C-G formula based on SCr of 0.69 mg/dL). Recent Labs  Lab 07/30/24 1635 07/31/24 0339 08/01/24 0350 08/02/24 0334 08/03/24 0404  WBC  --  12.3* 8.2 7.6 6.2  LATICACIDVEN 0.6  --   --   --   --     Liver Function Tests: Recent Labs  Lab 07/30/24 1634 07/31/24 0339 08/01/24 0350 08/02/24 0334 08/03/24 0404  AST 20  --   --   --   --  ALT 11  --   --   --   --   ALKPHOS 110  --   --   --   --   BILITOT 0.6  --   --   --   --   PROT 6.7  --   --   --   --   ALBUMIN 4.3 3.8 3.3* 3.5 3.4*   No results for input(s): LIPASE, AMYLASE in the last 168 hours. Recent Labs  Lab 07/30/24 1634  AMMONIA 31    ABG    Component Value Date/Time   PHART 7.37 07/30/2024 1636   PCO2ART 44 07/30/2024 1636   PO2ART 129 (H) 07/30/2024 1636   HCO3 25.4 07/30/2024 1636   ACIDBASEDEF 0.1 07/30/2024 1636   O2SAT 98.9 07/30/2024 1636     Coagulation Profile: Recent Labs  Lab 07/30/24 1634  INR 1.2    Cardiac Enzymes: No results for input(s): CKTOTAL, CKMB, CKMBINDEX, TROPONINI in the last 168 hours.  HbA1C: Hemoglobin A1C  Date/Time Value Ref Range Status  04/24/2024 11:08 AM 6.2 (A) 4.0 - 5.6 % Final  10/03/2023 09:43 AM 6.6 (A) 4.0 - 5.6 % Final   Hgb A1c MFr Bld  Date/Time Value Ref Range Status  09/14/2023  06:18 AM 6.8 (H) 4.8 - 5.6 % Final    Comment:    (NOTE) Pre diabetes:          5.7%-6.4%  Diabetes:              >6.4%  Glycemic control for   <7.0% adults with diabetes   06/13/2023 02:40 PM 6.3 (H) 4.8 - 5.6 % Final    Comment:             Prediabetes: 5.7 - 6.4          Diabetes: >6.4          Glycemic control for adults with diabetes: <7.0     CBG: Recent Labs  Lab 08/02/24 1118 08/02/24 1549 08/02/24 1911 08/02/24 2326 08/03/24 0336  GLUCAP 100* 68* 139* 127* 182*     DVT/GI PRX  assessed I Assessed the need for Labs I Assessed the need for Foley I Assessed the need for Central Venous Line Family Discussion when available I Assessed the need for Mobilization I made an Assessment of medications to be adjusted accordingly Safety Risk assessment completed  CASE DISCUSSED IN MULTIDISCIPLINARY ROUNDS WITH ICU TEAM   Critical Care Time devoted to patient care services described in this note is 45 minutes.  Critical care was necessary to treat /prevent imminent and life-threatening deterioration. Overall, patient is critically ill, prognosis is guarded.  Patient with Multiorgan failure and at high risk for cardiac arrest and death.    Nickolas Alm Cellar, M.D.  [facility]  Medical Director [facility]              "

## 2024-08-03 NOTE — Plan of Care (Signed)
 Continuing with plan of care.

## 2024-08-03 NOTE — Progress Notes (Signed)
 PHARMACY CONSULT NOTE - ELECTROLYTES  Pharmacy Consult for Electrolyte Monitoring and Replacement   Recent Labs: Height: 5' 6 (167.6 cm) Weight: 83.2 kg (183 lb 6.8 oz) IBW/kg (Calculated) : 59.3 Estimated Creatinine Clearance: 87.4 mL/min (by C-G formula based on SCr of 0.69 mg/dL). Potassium (mmol/L)  Date Value  08/03/2024 3.9  10/15/2012 3.0 (L)   Magnesium  (mg/dL)  Date Value  98/89/7973 2.2  10/04/2011 1.4 (L)   Calcium  (mg/dL)  Date Value  98/89/7973 8.9   Calcium , Total (mg/dL)  Date Value  96/75/7985 8.3 (L)   Albumin (g/dL)  Date Value  98/89/7973 3.4 (L)  10/03/2023 4.0  10/15/2012 3.9   Phosphorus (mg/dL)  Date Value  98/89/7973 4.4   Sodium (mmol/L)  Date Value  08/03/2024 143  10/03/2023 139  10/15/2012 141    Assessment  Andrea Sanford is a 54 y.o. female presenting with suspected drug overdose with possible baclofen  (UDS positive for cannabinoid and cocaine) requiring intubation and mechanical ventilation for airway protection. PMH significant for polysubstance abuse and bipolar disorder. Pharmacy has been consulted to monitor and replace electrolytes.  Diet: Vital AF at 60 mL/hr + free water  flushes at 30 mL every 4 hours  Goal of Therapy: Electrolytes WNL  Plan:  No supplementation needed at this time Check BMP, Mg, Phos with AM labs  Thank you for allowing pharmacy to be a part of this patient's care.  Adriana Bolster, PharmD, BCPS 08/03/2024 7:11 AM

## 2024-08-03 NOTE — Progress Notes (Signed)
 Patient with continued urinary retention with bladder scans greater than 600, provider notified and ordered to place indwelling foley catheter.

## 2024-08-04 DIAGNOSIS — J9602 Acute respiratory failure with hypercapnia: Secondary | ICD-10-CM | POA: Diagnosis not present

## 2024-08-04 DIAGNOSIS — F14129 Cocaine abuse with intoxication, unspecified: Secondary | ICD-10-CM | POA: Diagnosis not present

## 2024-08-04 DIAGNOSIS — J9601 Acute respiratory failure with hypoxia: Secondary | ICD-10-CM | POA: Diagnosis not present

## 2024-08-04 DIAGNOSIS — G9341 Metabolic encephalopathy: Secondary | ICD-10-CM | POA: Diagnosis not present

## 2024-08-04 LAB — CBC
HCT: 35.9 % — ABNORMAL LOW (ref 36.0–46.0)
Hemoglobin: 11.5 g/dL — ABNORMAL LOW (ref 12.0–15.0)
MCH: 30.4 pg (ref 26.0–34.0)
MCHC: 32 g/dL (ref 30.0–36.0)
MCV: 95 fL (ref 80.0–100.0)
Platelets: 153 K/uL (ref 150–400)
RBC: 3.78 MIL/uL — ABNORMAL LOW (ref 3.87–5.11)
RDW: 13.3 % (ref 11.5–15.5)
WBC: 6 K/uL (ref 4.0–10.5)
nRBC: 0 % (ref 0.0–0.2)

## 2024-08-04 LAB — GLUCOSE, CAPILLARY
Glucose-Capillary: 114 mg/dL — ABNORMAL HIGH (ref 70–99)
Glucose-Capillary: 120 mg/dL — ABNORMAL HIGH (ref 70–99)
Glucose-Capillary: 122 mg/dL — ABNORMAL HIGH (ref 70–99)
Glucose-Capillary: 133 mg/dL — ABNORMAL HIGH (ref 70–99)
Glucose-Capillary: 60 mg/dL — ABNORMAL LOW (ref 70–99)
Glucose-Capillary: 73 mg/dL (ref 70–99)
Glucose-Capillary: 93 mg/dL (ref 70–99)
Glucose-Capillary: 95 mg/dL (ref 70–99)

## 2024-08-04 LAB — CULTURE, BLOOD (SINGLE)
Culture: NO GROWTH
Special Requests: ADEQUATE

## 2024-08-04 LAB — RENAL FUNCTION PANEL
Albumin: 3.2 g/dL — ABNORMAL LOW (ref 3.5–5.0)
Anion gap: 7 (ref 5–15)
BUN: 24 mg/dL — ABNORMAL HIGH (ref 6–20)
CO2: 27 mmol/L (ref 22–32)
Calcium: 8.8 mg/dL — ABNORMAL LOW (ref 8.9–10.3)
Chloride: 110 mmol/L (ref 98–111)
Creatinine, Ser: 0.62 mg/dL (ref 0.44–1.00)
GFR, Estimated: >60 mL/min
Glucose, Bld: 130 mg/dL — ABNORMAL HIGH (ref 70–99)
Phosphorus: 3.5 mg/dL (ref 2.5–4.6)
Potassium: 3.7 mmol/L (ref 3.5–5.1)
Sodium: 143 mmol/L (ref 135–145)

## 2024-08-04 MED ORDER — DEXTROSE 50 % IV SOLN
INTRAVENOUS | Status: AC
  Administered 2024-08-04: 50 mL
  Filled 2024-08-04: qty 50

## 2024-08-04 MED ORDER — DEXTROSE 50 % IV SOLN: 1.0000 | Freq: Once | INTRAVENOUS | Status: DC

## 2024-08-04 MED ORDER — POTASSIUM CHLORIDE 20 MEQ PO PACK
20.0000 meq | PACK | Freq: Once | ORAL | Status: AC
  Administered 2024-08-04: 20 meq
  Filled 2024-08-04: qty 1

## 2024-08-04 NOTE — Plan of Care (Signed)
  Problem: Activity: Goal: Ability to tolerate increased activity will improve Outcome: Not Progressing   Problem: Respiratory: Goal: Ability to maintain a clear airway and adequate ventilation will improve Outcome: Not Progressing   Problem: Role Relationship: Goal: Method of communication will improve Outcome: Not Progressing   Problem: Education: Goal: Knowledge of General Education information will improve Description: Including pain rating scale, medication(s)/side effects and non-pharmacologic comfort measures Outcome: Not Progressing   Problem: Health Behavior/Discharge Planning: Goal: Ability to manage health-related needs will improve Outcome: Not Progressing   Problem: Clinical Measurements: Goal: Ability to maintain clinical measurements within normal limits will improve Outcome: Not Progressing Goal: Will remain free from infection Outcome: Not Progressing Goal: Diagnostic test results will improve Outcome: Not Progressing Goal: Respiratory complications will improve Outcome: Not Progressing Goal: Cardiovascular complication will be avoided Outcome: Not Progressing   Problem: Activity: Goal: Risk for activity intolerance will decrease Outcome: Not Progressing   Problem: Nutrition: Goal: Adequate nutrition will be maintained Outcome: Not Progressing   Problem: Coping: Goal: Level of anxiety will decrease Outcome: Not Progressing   Problem: Elimination: Goal: Will not experience complications related to bowel motility Outcome: Not Progressing Goal: Will not experience complications related to urinary retention Outcome: Not Progressing   Problem: Pain Managment: Goal: General experience of comfort will improve and/or be controlled Outcome: Not Progressing   Problem: Safety: Goal: Ability to remain free from injury will improve Outcome: Not Progressing   Problem: Skin Integrity: Goal: Risk for impaired skin integrity will decrease Outcome: Not  Progressing

## 2024-08-04 NOTE — Progress Notes (Signed)
 Earlier patient's CBG reading was 60, provider notified and D50 amp administered.  Post D50 amp CBG reading was 120.

## 2024-08-04 NOTE — Consult Note (Cosign Needed Addendum)
 Psychiatry attempted to complete an initial psychiatric evaluation per consultation request. However, upon observation today, patient remains intubated and was unable to participate in verbal communication necessary to complete the psychiatric interview.  Psychiatry will continue to defer the initial psychiatric evaluation until patient is extubated and able to engage in assessment. Please notify psychiatry when patient is extubated and able to participate in the psychiatric evaluation and the consultation can be completed as requested. Will reassess once patient is medically stable and able to communicate.

## 2024-08-04 NOTE — Plan of Care (Signed)
 Continuing with plan of care.

## 2024-08-04 NOTE — Progress Notes (Signed)
 "  NAME:  Andrea Sanford, MRN:  [id number], DOB:  [birth year], LOS: 5 ADMISSION DATE:  07/30/2024, CONSULTATION DATE:  07/30/2024 REFERRING MD:  Dr. Dicky,   CHIEF COMPLAINT:  Unresponsive, suspected drug overdose  COCAINE TOXICITY  Brief Pt Description / Synopsis:  54 year old female with past medical history significant for polysubstance abuse and bipolar disorder who presented after being found unresponsive in the setting of suspected drug overdose with possible baclofen  (UDS positive for cannabinoid and cocaine) requiring intubation and mechanical ventilation for airway protection.  History of Present Illness:  Andrea Sanford is a 54 year old female with a past medical history significant for polysubstance abuse, bipolar disorder, generalized anxiety disorder, hypertension, hyperlipidemia, diabetes mellitus type 2 who presents to [facility] ED on 07/30/2024 after being found unresponsive.  Patient is currently intubated and unable to contribute to history and no family is currently available, therefore history is extremely limited and is obtained from chart review.  Per ED and nursing notes, she [place] road[place] after being found unresponsive with pinpoint pupils, was hypoxic along with respiratory arrest.  She was given Narcan  with improvement in respiratory status, however remained unresponsive.  EMS was told on scene that the patient's significant other had a similar event/overdose within the last hour as well.  There was concern that the patient could have taken Baclofen  as well.  No evidence of trauma.  Upon arrival to the ED she remained unresponsive with sonorous respirations for which she was intubated for airway protection.  Chest X-ray>>IMPRESSION: 1. Endotracheal tube tip projects 4 cm above the carina. 2. Enteric tube extends below the diaphragm, with the tip beyond the inferior margin of the image. 3. No acute cardiopulmonary abnormality. CT Head wo  contrast>>IMPRESSION: 1. No acute intracranial abnormality. 2. Bilateral displaced nasal bone fractures of indeterminate age. 3. Stable right maxillary sinus polyps versus mucous retention cysts. CT Cervical Spine>>IMPRESSION: 1. No acute fracture or subluxation in the cervical spine. 2. Mild to moderate severity degenerative changes at the level of C5-C6.   PCCM asked to admit for further workup and treatment.   Pertinent  Medical History   Past Medical History:  Diagnosis Date   Allergy    Anxiety    Arthritis    Asthma    Bipolar 1 disorder (HCC)    Cancer (HCC)    COPD (chronic obstructive pulmonary disease) (HCC)    Depression    Diabetes mellitus without complication (HCC)    Drug abuse (HCC)    Hypertension    Neuromuscular disorder (HCC)    Osteoporosis    PTSD (post-traumatic stress disorder) 04/07/2024   Sleep apnea     Micro Data:  1/6: COVID/flu/RSV PCR>> 1/6: MRSA PCR>>  Antimicrobials:   Anti-infectives (From admission, onward)    None       Significant Hospital Events: Including procedures, antibiotic start and stop dates in addition to other pertinent events   1/6: Presented to ED unresponsive requiring intubation for airway protection.  PCCM asked to admit 1/7 remains encephalopathic 1/8 remains encephalopathic 1/9 remains encephalopathic 1/10 remains encephalopathic 1/11 remains encephalopathic, plan for WUA  Interim History / Subjective:  Remains critically ill Remains intubated WUA pending    Vent Mode: PRVC FiO2 (%):  [24 %-28 %] 24 % Set Rate:  [18 bmp] 18 bmp Vt Set:  [450 mL] 450 mL PEEP:  [5 cmH20] 5 cmH20 Plateau Pressure:  [15 cmH20] 15 cmH20   Objective   Blood pressure 119/69, pulse 67, temperature (!)  97.5 F (36.4 C), temperature source Axillary, resp. rate 18, height 5' 6 (1.676 m), weight 82.9 kg, SpO2 100%.    Vent Mode: PRVC FiO2 (%):  [24 %-28 %] 24 % Set Rate:  [18 bmp] 18 bmp Vt Set:  [450 mL] 450  mL PEEP:  [5 cmH20] 5 cmH20 Plateau Pressure:  [15 cmH20] 15 cmH20   Intake/Output Summary (Last 24 hours) at 08/04/2024 0734 Last data filed at 08/04/2024 9380 Gross per 24 hour  Intake 2332.05 ml  Output 1880 ml  Net 452.05 ml   Filed Weights   08/02/24 0342 08/03/24 0357 08/04/24 0500  Weight: 82.1 kg 83.2 kg 82.9 kg     REVIEW OF SYSTEMS  PATIENT IS UNABLE TO PROVIDE COMPLETE REVIEW OF SYSTEMS DUE TO SEVERE CRITICAL ILLNESS   Intubated,sedated No wheezing Soft ND No edema   Assessment & Plan:   54 yo white female with severe metabolic encephalopathy from severe cocaine toxicity leading to inability to protect airway and very high risk for aspiration   NEUROLOGY ACUTE METABOLIC ENCEPHALOPATHY-COCAINE TOXICITY continue Mechanical Ventilator support -Wean Fio2 and PEEP as tolerated -VAP/VENT bundle implementation - Wean PEEP & FiO2 as tolerated, maintain SpO2 > 88% - Head of bed elevated 30 degrees, VAP protocol in place - Plateau pressures less than 30 cm H20  - Intermittent chest x-ray & ABG PRN - Ensure adequate pulmonary hygiene    ENDO - ICU hypoglycemic\Hyperglycemia protocol -check FSBS per protocol   GI GI PROPHYLAXIS as indicated NUTRITIONAL STATUS DIET-->TF's as tolerated Constipation protocol as indicated   ELECTROLYTES -follow labs as needed -replace as needed -pharmacy consultation and following  RESTRICTIVE TRANSFUSION PROTOCOL TRANSFUSION  IF HGB<7  or ACTIVE BLEEDING OR DX of ACUTE CORONARY SYNDROMES      Best Practice (right click and Reselect all SmartList Selections daily)   Diet/type: NPO DVT prophylaxis: LMWH GI prophylaxis: PPI Lines: N/A Foley:  Yes, and it is still needed Code Status:  full code Last date of multidisciplinary goals of care discussion [N/A]   Labs   CBC: Recent Labs  Lab 07/30/24 1634 07/31/24 0339 08/01/24 0350 08/02/24 0334 08/03/24 0404 08/04/24 0426  WBC 9.9 12.3* 8.2 7.6 6.2 6.0   NEUTROABS 7.1  --   --   --   --   --   HGB 13.4 12.8 11.9* 13.3 11.5* 11.5*  HCT 40.2 40.1 36.7 43.2 36.8 35.9*  MCV 92.2 94.1 94.1 97.5 95.1 95.0  PLT 182 172 158 160 170 153    Basic Metabolic Panel: Recent Labs  Lab 07/30/24 1634 07/31/24 0339 08/01/24 0350 08/02/24 0334 08/03/24 0404 08/04/24 0426  NA 145 144 146* 145 143 143  K 3.1* 4.0 3.7 3.7 3.9 3.7  CL 106 110 111 109 109 110  CO2 27 24 25 26 27 27   GLUCOSE 139* 137* 99 121* 150* 130*  BUN 23* 18 14 15  26* 24*  CREATININE 0.72 0.71 0.75 0.78 0.69 0.62  CALCIUM  9.2 9.4 8.8* 8.8* 8.9 8.8*  MG 1.9 2.0 2.1 2.2 2.2  --   PHOS 3.5 3.2 3.0 3.8 4.4 3.5   GFR: Estimated Creatinine Clearance: 87.2 mL/min (by C-G formula based on SCr of 0.62 mg/dL). Recent Labs  Lab 07/30/24 1635 07/31/24 0339 08/01/24 0350 08/02/24 0334 08/03/24 0404 08/04/24 0426  WBC  --    < > 8.2 7.6 6.2 6.0  LATICACIDVEN 0.6  --   --   --   --   --    < > =  values in this interval not displayed.    Liver Function Tests: Recent Labs  Lab 07/30/24 1634 07/31/24 0339 08/01/24 0350 08/02/24 0334 08/03/24 0404 08/04/24 0426  AST 20  --   --   --   --   --   ALT 11  --   --   --   --   --   ALKPHOS 110  --   --   --   --   --   BILITOT 0.6  --   --   --   --   --   PROT 6.7  --   --   --   --   --   ALBUMIN 4.3 3.8 3.3* 3.5 3.4* 3.2*   No results for input(s): LIPASE, AMYLASE in the last 168 hours. Recent Labs  Lab 07/30/24 1634  AMMONIA 31    ABG    Component Value Date/Time   PHART 7.37 07/30/2024 1636   PCO2ART 44 07/30/2024 1636   PO2ART 129 (H) 07/30/2024 1636   HCO3 25.4 07/30/2024 1636   ACIDBASEDEF 0.1 07/30/2024 1636   O2SAT 98.9 07/30/2024 1636     Coagulation Profile: Recent Labs  Lab 07/30/24 1634  INR 1.2    Cardiac Enzymes: No results for input(s): CKTOTAL, CKMB, CKMBINDEX, TROPONINI in the last 168 hours.  HbA1C: Hemoglobin A1C  Date/Time Value Ref Range Status  04/24/2024 11:08 AM  6.2 (A) 4.0 - 5.6 % Final  10/03/2023 09:43 AM 6.6 (A) 4.0 - 5.6 % Final   Hgb A1c MFr Bld  Date/Time Value Ref Range Status  09/14/2023 06:18 AM 6.8 (H) 4.8 - 5.6 % Final    Comment:    (NOTE) Pre diabetes:          5.7%-6.4%  Diabetes:              >6.4%  Glycemic control for   <7.0% adults with diabetes   06/13/2023 02:40 PM 6.3 (H) 4.8 - 5.6 % Final    Comment:             Prediabetes: 5.7 - 6.4          Diabetes: >6.4          Glycemic control for adults with diabetes: <7.0     CBG: Recent Labs  Lab 08/03/24 1120 08/03/24 1546 08/03/24 2037 08/04/24 0009 08/04/24 0439  GLUCAP 101* 91 132* 133* 114*      DVT/GI PRX  assessed I Assessed the need for Labs I Assessed the need for Foley I Assessed the need for Central Venous Line Family Discussion when available I Assessed the need for Mobilization I made an Assessment of medications to be adjusted accordingly Safety Risk assessment completed  CASE DISCUSSED IN MULTIDISCIPLINARY ROUNDS WITH ICU TEAM   Critical Care Time devoted to patient care services described in this note is 45 minutes.   Nickolas Alm Cellar, M.D.  [facility]  Medical Director [facility] [place]              "

## 2024-08-04 NOTE — Progress Notes (Signed)
 PHARMACY CONSULT NOTE - ELECTROLYTES  Pharmacy Consult for Electrolyte Monitoring and Replacement   Recent Labs: Height: 5' 6 (167.6 cm) Weight: 82.9 kg (182 lb 12.2 oz) IBW/kg (Calculated) : 59.3 Estimated Creatinine Clearance: 87.2 mL/min (by C-G formula based on SCr of 0.62 mg/dL). Potassium (mmol/L)  Date Value  08/04/2024 3.7  10/15/2012 3.0 (L)   Magnesium  (mg/dL)  Date Value  98/89/7973 2.2  10/04/2011 1.4 (L)   Calcium  (mg/dL)  Date Value  98/88/7973 8.8 (L)   Calcium , Total (mg/dL)  Date Value  96/75/7985 8.3 (L)   Albumin (g/dL)  Date Value  98/88/7973 3.2 (L)  10/03/2023 4.0  10/15/2012 3.9   Phosphorus (mg/dL)  Date Value  98/88/7973 3.5   Sodium (mmol/L)  Date Value  08/04/2024 143  10/03/2023 139  10/15/2012 141    Assessment  Andrea Sanford is a 54 y.o. female presenting with suspected drug overdose with possible baclofen  (UDS positive for cannabinoid and cocaine) requiring intubation and mechanical ventilation for airway protection. PMH significant for polysubstance abuse and bipolar disorder. Pharmacy has been consulted to monitor and replace electrolytes.  Diet: Vital AF at 60 mL/hr + free water  flushes at 30 mL every 4 hours  Goal of Therapy: Electrolytes WNL  Plan:  20 mEq KCl per tube x 1 Check BMP, Mg, Phos with AM labs  Thank you for allowing pharmacy to be a part of this patient's care.  Adriana Bolster, PharmD, BCPS 08/04/2024 7:03 AM

## 2024-08-05 DIAGNOSIS — G9341 Metabolic encephalopathy: Secondary | ICD-10-CM | POA: Diagnosis not present

## 2024-08-05 DIAGNOSIS — F14129 Cocaine abuse with intoxication, unspecified: Secondary | ICD-10-CM | POA: Diagnosis not present

## 2024-08-05 DIAGNOSIS — J9601 Acute respiratory failure with hypoxia: Secondary | ICD-10-CM | POA: Diagnosis not present

## 2024-08-05 DIAGNOSIS — J9602 Acute respiratory failure with hypercapnia: Secondary | ICD-10-CM | POA: Diagnosis not present

## 2024-08-05 LAB — CBC
HCT: 35 % — ABNORMAL LOW (ref 36.0–46.0)
Hemoglobin: 11.1 g/dL — ABNORMAL LOW (ref 12.0–15.0)
MCH: 30.2 pg (ref 26.0–34.0)
MCHC: 31.7 g/dL (ref 30.0–36.0)
MCV: 95.1 fL (ref 80.0–100.0)
Platelets: 167 K/uL (ref 150–400)
RBC: 3.68 MIL/uL — ABNORMAL LOW (ref 3.87–5.11)
RDW: 13.1 % (ref 11.5–15.5)
WBC: 5.6 K/uL (ref 4.0–10.5)
nRBC: 0 % (ref 0.0–0.2)

## 2024-08-05 LAB — GLUCOSE, CAPILLARY
Glucose-Capillary: 125 mg/dL — ABNORMAL HIGH (ref 70–99)
Glucose-Capillary: 130 mg/dL — ABNORMAL HIGH (ref 70–99)
Glucose-Capillary: 151 mg/dL — ABNORMAL HIGH (ref 70–99)
Glucose-Capillary: 154 mg/dL — ABNORMAL HIGH (ref 70–99)
Glucose-Capillary: 164 mg/dL — ABNORMAL HIGH (ref 70–99)
Glucose-Capillary: 86 mg/dL (ref 70–99)

## 2024-08-05 LAB — RENAL FUNCTION PANEL
Albumin: 3.2 g/dL — ABNORMAL LOW (ref 3.5–5.0)
Anion gap: 6 (ref 5–15)
BUN: 25 mg/dL — ABNORMAL HIGH (ref 6–20)
CO2: 26 mmol/L (ref 22–32)
Calcium: 8.9 mg/dL (ref 8.9–10.3)
Chloride: 110 mmol/L (ref 98–111)
Creatinine, Ser: 0.59 mg/dL (ref 0.44–1.00)
GFR, Estimated: >60 mL/min
Glucose, Bld: 159 mg/dL — ABNORMAL HIGH (ref 70–99)
Phosphorus: 3.7 mg/dL (ref 2.5–4.6)
Potassium: 4.3 mmol/L (ref 3.5–5.1)
Sodium: 142 mmol/L (ref 135–145)

## 2024-08-05 LAB — MAGNESIUM: Magnesium: 2 mg/dL (ref 1.7–2.4)

## 2024-08-05 MED ORDER — PHENOBARBITAL SODIUM 130 MG/ML IJ SOLN: 65.0000 mg | Freq: Three times a day (TID) | INTRAMUSCULAR | Status: DC

## 2024-08-05 MED ORDER — PHENOBARBITAL SODIUM 130 MG/ML IJ SOLN: 130.0000 mg | INTRAMUSCULAR | Status: DC | PRN

## 2024-08-05 MED ORDER — PHENOBARBITAL SODIUM 130 MG/ML IJ SOLN
130.0000 mg | INTRAMUSCULAR | Status: AC | PRN
  Administered 2024-08-05 (×2): 130 mg via INTRAVENOUS
  Filled 2024-08-05 (×2): qty 1

## 2024-08-05 MED ORDER — THIAMINE MONONITRATE 100 MG PO TABS
100.0000 mg | ORAL_TABLET | Freq: Every day | ORAL | Status: DC
  Administered 2024-08-06 – 2024-08-08 (×3): 100 mg via ORAL
  Filled 2024-08-05 (×3): qty 1

## 2024-08-05 MED ORDER — PHENOBARBITAL SODIUM 65 MG/ML IJ SOLN: 32.5000 mg | Freq: Three times a day (TID) | INTRAMUSCULAR | Status: DC

## 2024-08-05 MED ORDER — SODIUM CHLORIDE 0.9 % IV SOLN
260.0000 mg | Freq: Once | INTRAVENOUS | Status: DC
  Filled 2024-08-05: qty 2

## 2024-08-05 MED ORDER — PHENOBARBITAL SODIUM 130 MG/ML IJ SOLN
130.0000 mg | Freq: Once | INTRAMUSCULAR | Status: AC
  Administered 2024-08-05: 130 mg via INTRAVENOUS
  Filled 2024-08-05: qty 1

## 2024-08-05 MED ORDER — PHENOBARBITAL SODIUM 130 MG/ML IJ SOLN
97.5000 mg | Freq: Three times a day (TID) | INTRAMUSCULAR | Status: DC
  Administered 2024-08-06: 97.5 mg via INTRAVENOUS
  Filled 2024-08-05: qty 1

## 2024-08-05 MED ORDER — MORPHINE SULFATE (PF) 2 MG/ML IV SOLN: 1.0000 mg | INTRAVENOUS | Status: DC | PRN

## 2024-08-05 MED ORDER — DEXMEDETOMIDINE HCL IN NACL 400 MCG/100ML IV SOLN
0.0000 ug/kg/h | INTRAVENOUS | Status: DC
  Administered 2024-08-05: 1.2 ug/kg/h via INTRAVENOUS
  Administered 2024-08-05: 0.4 ug/kg/h via INTRAVENOUS
  Administered 2024-08-05 – 2024-08-06 (×3): 1.2 ug/kg/h via INTRAVENOUS
  Administered 2024-08-06: 1.1 ug/kg/h via INTRAVENOUS
  Filled 2024-08-05 (×6): qty 100

## 2024-08-05 MED ORDER — HYDRALAZINE HCL 20 MG/ML IJ SOLN: 10.0000 mg | Freq: Four times a day (QID) | INTRAMUSCULAR | Status: DC | PRN

## 2024-08-05 MED ORDER — ORAL CARE MOUTH RINSE: 15.0000 mL | OROMUCOSAL | Status: DC | PRN

## 2024-08-05 MED ORDER — THIAMINE HCL 100 MG/ML IJ SOLN
100.0000 mg | Freq: Every day | INTRAMUSCULAR | Status: DC
  Administered 2024-08-05: 100 mg via INTRAVENOUS
  Filled 2024-08-05: qty 2

## 2024-08-05 MED ORDER — ORAL CARE MOUTH RINSE
15.0000 mL | OROMUCOSAL | Status: DC
  Administered 2024-08-05 – 2024-08-08 (×7): 15 mL via OROMUCOSAL

## 2024-08-05 NOTE — Plan of Care (Signed)
  Problem: Activity: Goal: Ability to tolerate increased activity will improve Outcome: Progressing   Problem: Respiratory: Goal: Ability to maintain a clear airway and adequate ventilation will improve Outcome: Progressing   Problem: Role Relationship: Goal: Method of communication will improve Outcome: Progressing   Problem: Education: Goal: Knowledge of General Education information will improve Description: Including pain rating scale, medication(s)/side effects and non-pharmacologic comfort measures Outcome: Progressing   Problem: Health Behavior/Discharge Planning: Goal: Ability to manage health-related needs will improve Outcome: Progressing   Problem: Clinical Measurements: Goal: Ability to maintain clinical measurements within normal limits will improve Outcome: Progressing Goal: Will remain free from infection Outcome: Progressing Goal: Diagnostic test results will improve Outcome: Progressing Goal: Respiratory complications will improve Outcome: Progressing Goal: Cardiovascular complication will be avoided Outcome: Progressing   Problem: Activity: Goal: Risk for activity intolerance will decrease Outcome: Progressing   Problem: Nutrition: Goal: Adequate nutrition will be maintained Outcome: Progressing   Problem: Coping: Goal: Level of anxiety will decrease Outcome: Progressing   Problem: Elimination: Goal: Will not experience complications related to bowel motility Outcome: Progressing Goal: Will not experience complications related to urinary retention Outcome: Progressing   Problem: Pain Managment: Goal: General experience of comfort will improve and/or be controlled Outcome: Progressing   Problem: Safety: Goal: Ability to remain free from injury will improve Outcome: Progressing   Problem: Skin Integrity: Goal: Risk for impaired skin integrity will decrease Outcome: Progressing

## 2024-08-05 NOTE — Progress Notes (Signed)
 "  NAME:  Andrea Sanford, MRN:  [id number], DOB:  1970-09-09, LOS: 6 ADMISSION DATE:  07/30/2024, CONSULTATION DATE:  07/30/2024 REFERRING MD:  Dr. Dicky,   CHIEF COMPLAINT:  Unresponsive, suspected drug overdose  COCAINE TOXICITY  Brief Pt Description / Synopsis:  54 year old female with past medical history significant for polysubstance abuse and bipolar disorder who presented after being found unresponsive in the setting of suspected drug overdose with possible baclofen  (UDS positive for cannabinoid and cocaine) requiring intubation and mechanical ventilation for airway protection.  History of Present Illness:  Andrea Sanford is a 54 year old female with a past medical history significant for polysubstance abuse, bipolar disorder, generalized anxiety disorder, hypertension, hyperlipidemia, diabetes mellitus type 2 who presents to [facility] ED on 07/30/2024 after being found unresponsive.  Patient is currently intubated and unable to contribute to history and no family is currently available, therefore history is extremely limited and is obtained from chart review.  Per ED and nursing notes, she was [place] roadside via EMS after being found unresponsive with pinpoint pupils, was hypoxic along with respiratory arrest.  She was given Narcan  with improvement in respiratory status, however remained unresponsive.  EMS was told on scene that the patient's significant other had a similar event/overdose within the last hour as well.  There was concern that the patient could have taken Baclofen  as well.  No evidence of trauma.  Upon arrival to the ED she remained unresponsive with sonorous respirations for which she was intubated for airway protection.  Chest X-ray>>IMPRESSION: 1. Endotracheal tube tip projects 4 cm above the carina. 2. Enteric tube extends below the diaphragm, with the tip beyond the inferior margin of the image. 3. No acute cardiopulmonary abnormality. CT Head wo  contrast>>IMPRESSION: 1. No acute intracranial abnormality. 2. Bilateral displaced nasal bone fractures of indeterminate age. 3. Stable right maxillary sinus polyps versus mucous retention cysts. CT Cervical Spine>>IMPRESSION: 1. No acute fracture or subluxation in the cervical spine. 2. Mild to moderate severity degenerative changes at the level of C5-C6.   PCCM asked to admit for further workup and treatment.   Pertinent  Medical History   Past Medical History:  Diagnosis Date   Allergy    Anxiety    Arthritis    Asthma    Bipolar 1 disorder (HCC)    Cancer (HCC)    COPD (chronic obstructive pulmonary disease) (HCC)    Depression    Diabetes mellitus without complication (HCC)    Drug abuse (HCC)    Hypertension    Neuromuscular disorder (HCC)    Osteoporosis    PTSD (post-traumatic stress disorder) 04/07/2024   Sleep apnea     Micro Data:  1/6: COVID/flu/RSV PCR>> 1/6: MRSA PCR>>  Antimicrobials:   Anti-infectives (From admission, onward)    None       Significant Hospital Events: Including procedures, antibiotic start and stop dates in addition to other pertinent events   1/6: Presented to ED unresponsive requiring intubation for airway protection.  PCCM asked to admit 1/7 remains encephalopathic 1/8 remains encephalopathic 1/9 remains encephalopathic 1/10 remains encephalopathic 1/11 remains encephalopathic, plan for WUA 1/11 failed weaning trial 1/12 remains on vent  Interim History / Subjective:  Remains critically ill Remains intubated Requires VENT support for survival Plan for WUA    Vent Mode: PRVC FiO2 (%):  [24 %] 24 % Set Rate:  [18 bmp] 18 bmp Vt Set:  [450 mL] 450 mL PEEP:  [5 cmH20] 5 cmH20 Plateau Pressure:  [12 cmH20-14  cmH20] 12 cmH20   Objective   Blood pressure (!) 92/53, pulse 76, temperature 98.7 F (37.1 C), temperature source Axillary, resp. rate 18, height 5' 6 (1.676 m), weight 83.4 kg, SpO2 96%.    Vent Mode:  PRVC FiO2 (%):  [24 %] 24 % Set Rate:  [18 bmp] 18 bmp Vt Set:  [450 mL] 450 mL PEEP:  [5 cmH20] 5 cmH20 Plateau Pressure:  [12 cmH20-14 cmH20] 12 cmH20   Intake/Output Summary (Last 24 hours) at 08/05/2024 0902 Last data filed at 08/05/2024 9178 Gross per 24 hour  Intake 2065.34 ml  Output 2090 ml  Net -24.66 ml   Filed Weights   08/03/24 0357 08/04/24 0500 08/05/24 0413  Weight: 83.2 kg 82.9 kg 83.4 kg     REVIEW OF SYSTEMS  PATIENT IS UNABLE TO PROVIDE COMPLETE REVIEW OF SYSTEMS DUE TO SEVERE CRITICAL ILLNESS   Intubated,sedated No wheezing Soft ND No edema   Assessment & Plan:   54 yo white female with severe metabolic encephalopathy from severe cocaine toxicity leading to inability to protect airway and very high risk for aspiration   NEUROLOGY ACUTE METABOLIC ENCEPHALOPATHY COCAINE TOXICITY  Respiratory Failure -continue Mechanical Ventilator support -Wean Fio2 and PEEP as tolerated -VAP/VENT bundle implementation - Wean PEEP & FiO2 as tolerated, maintain SpO2 > 88% - Head of bed elevated 30 degrees, VAP protocol in place - Plateau pressures less than 30 cm H20  - Intermittent chest x-ray & ABG PRN - Ensure adequate pulmonary hygiene  -will perform SAT/SBT when respiratory parameters are met   ENDO - ICU hypoglycemic\Hyperglycemia protocol -check FSBS per protocol   GI GI PROPHYLAXIS as indicated NUTRITIONAL STATUS DIET-->TF's as tolerated Constipation protocol as indicated   ELECTROLYTES -follow labs as needed -replace as needed -pharmacy consultation and following  RESTRICTIVE TRANSFUSION PROTOCOL TRANSFUSION  IF HGB<7  or ACTIVE BLEEDING OR DX of ACUTE CORONARY SYNDROMES      Best Practice (right click and Reselect all SmartList Selections daily)   Diet/type: NPO DVT prophylaxis: LMWH GI prophylaxis: PPI Lines: N/A Foley:  Yes, and it is still needed Code Status:  full code Last date of multidisciplinary goals of care  discussion [N/A]   Labs   CBC: Recent Labs  Lab 07/30/24 1634 07/31/24 0339 08/01/24 0350 08/02/24 0334 08/03/24 0404 08/04/24 0426 08/05/24 0404  WBC 9.9   < > 8.2 7.6 6.2 6.0 5.6  NEUTROABS 7.1  --   --   --   --   --   --   HGB 13.4   < > 11.9* 13.3 11.5* 11.5* 11.1*  HCT 40.2   < > 36.7 43.2 36.8 35.9* 35.0*  MCV 92.2   < > 94.1 97.5 95.1 95.0 95.1  PLT 182   < > 158 160 170 153 167   < > = values in this interval not displayed.    Basic Metabolic Panel: Recent Labs  Lab 07/31/24 0339 08/01/24 0350 08/02/24 0334 08/03/24 0404 08/04/24 0426 08/05/24 0404  NA 144 146* 145 143 143 142  K 4.0 3.7 3.7 3.9 3.7 4.3  CL 110 111 109 109 110 110  CO2 24 25 26 27 27 26   GLUCOSE 137* 99 121* 150* 130* 159*  BUN 18 14 15  26* 24* 25*  CREATININE 0.71 0.75 0.78 0.69 0.62 0.59  CALCIUM  9.4 8.8* 8.8* 8.9 8.8* 8.9  MG 2.0 2.1 2.2 2.2  --  2.0  PHOS 3.2 3.0 3.8 4.4 3.5 3.7   GFR: Estimated  Creatinine Clearance: 86.4 mL/min (by C-G formula based on SCr of 0.59 mg/dL). Recent Labs  Lab 07/30/24 1635 07/31/24 0339 08/02/24 0334 08/03/24 0404 08/04/24 0426 08/05/24 0404  WBC  --    < > 7.6 6.2 6.0 5.6  LATICACIDVEN 0.6  --   --   --   --   --    < > = values in this interval not displayed.    Liver Function Tests: Recent Labs  Lab 07/30/24 1634 07/31/24 0339 08/01/24 0350 08/02/24 0334 08/03/24 0404 08/04/24 0426 08/05/24 0404  AST 20  --   --   --   --   --   --   ALT 11  --   --   --   --   --   --   ALKPHOS 110  --   --   --   --   --   --   BILITOT 0.6  --   --   --   --   --   --   PROT 6.7  --   --   --   --   --   --   ALBUMIN 4.3   < > 3.3* 3.5 3.4* 3.2* 3.2*   < > = values in this interval not displayed.   No results for input(s): LIPASE, AMYLASE in the last 168 hours. Recent Labs  Lab 07/30/24 1634  AMMONIA 31    ABG    Component Value Date/Time   PHART 7.37 07/30/2024 1636   PCO2ART 44 07/30/2024 1636   PO2ART 129 (H) 07/30/2024 1636    HCO3 25.4 07/30/2024 1636   ACIDBASEDEF 0.1 07/30/2024 1636   O2SAT 98.9 07/30/2024 1636     Coagulation Profile: Recent Labs  Lab 07/30/24 1634  INR 1.2    Cardiac Enzymes: No results for input(s): CKTOTAL, CKMB, CKMBINDEX, TROPONINI in the last 168 hours.  HbA1C: Hemoglobin A1C  Date/Time Value Ref Range Status  04/24/2024 11:08 AM 6.2 (A) 4.0 - 5.6 % Final  10/03/2023 09:43 AM 6.6 (A) 4.0 - 5.6 % Final   Hgb A1c MFr Bld  Date/Time Value Ref Range Status  09/14/2023 06:18 AM 6.8 (H) 4.8 - 5.6 % Final    Comment:    (NOTE) Pre diabetes:          5.7%-6.4%  Diabetes:              >6.4%  Glycemic control for   <7.0% adults with diabetes   06/13/2023 02:40 PM 6.3 (H) 4.8 - 5.6 % Final    Comment:             Prediabetes: 5.7 - 6.4          Diabetes: >6.4          Glycemic control for adults with diabetes: <7.0     CBG: Recent Labs  Lab 08/04/24 1658 08/04/24 1930 08/04/24 2316 08/05/24 0401 08/05/24 0721  GLUCAP 120* 93 122* 151* 125*      DVT/GI PRX  assessed I Assessed the need for Labs I Assessed the need for Foley I Assessed the need for Central Venous Line Family Discussion when available I Assessed the need for Mobilization I made an Assessment of medications to be adjusted accordingly Safety Risk assessment completed  CASE DISCUSSED IN MULTIDISCIPLINARY ROUNDS WITH ICU TEAM     Critical Care Time devoted to patient care services described in this note is 45 minutes.  Critical care was necessary to treat /  prevent imminent and life-threatening deterioration.  Nickolas Alm Cellar, M.D.  [facility]  Medical Director [facility] [place]              "

## 2024-08-05 NOTE — Progress Notes (Signed)
 The patient has a 1:1 sitter order as the patient tries to get out of bed becomes aggressive and punches, kicks and attempts to spit at staff. The patient continues to try to remove IV lines. The patient is only alert and oriented to self.

## 2024-08-05 NOTE — Progress Notes (Signed)
 PHARMACY CONSULT NOTE - ELECTROLYTES  Pharmacy Consult for Electrolyte Monitoring and Replacement   Recent Labs: Height: 5' 6 (167.6 cm) Weight: 83.4 kg (183 lb 13.8 oz) IBW/kg (Calculated) : 59.3 Estimated Creatinine Clearance: 86.4 mL/min (by C-G formula based on SCr of 0.59 mg/dL). Potassium (mmol/L)  Date Value  08/05/2024 4.3  10/15/2012 3.0 (L)   Magnesium  (mg/dL)  Date Value  98/87/7973 2.0  10/04/2011 1.4 (L)   Calcium  (mg/dL)  Date Value  98/87/7973 8.9   Calcium , Total (mg/dL)  Date Value  96/75/7985 8.3 (L)   Albumin (g/dL)  Date Value  98/87/7973 3.2 (L)  10/03/2023 4.0  10/15/2012 3.9   Phosphorus (mg/dL)  Date Value  98/87/7973 3.7   Sodium (mmol/L)  Date Value  08/05/2024 142  10/03/2023 139  10/15/2012 141    Assessment  Andrea Sanford is a 54 y.o. female presenting with suspected drug overdose with possible baclofen  (UDS positive for cannabinoid and cocaine) requiring intubation and mechanical ventilation for airway protection. PMH significant for polysubstance abuse and bipolar disorder. Pharmacy has been consulted to monitor and replace electrolytes.  Diet: Vital AF at 60 mL/hr + free water  flushes at 30 mL every 4 hours  Goal of Therapy: Electrolytes WNL  Plan:  No electrolyte replacement indicated at this time Follow-up electrolytes with AM labs tomorrow  Thank you for allowing pharmacy to be a part of this patient's care.  Marolyn KATHEE Mare 08/05/2024 8:08 AM

## 2024-08-06 ENCOUNTER — Encounter: Payer: MEDICAID | Admitting: Obstetrics & Gynecology

## 2024-08-06 DIAGNOSIS — T50902A Poisoning by unspecified drugs, medicaments and biological substances, intentional self-harm, initial encounter: Secondary | ICD-10-CM

## 2024-08-06 DIAGNOSIS — F14129 Cocaine abuse with intoxication, unspecified: Secondary | ICD-10-CM | POA: Diagnosis not present

## 2024-08-06 DIAGNOSIS — J9601 Acute respiratory failure with hypoxia: Secondary | ICD-10-CM | POA: Diagnosis not present

## 2024-08-06 DIAGNOSIS — G9341 Metabolic encephalopathy: Secondary | ICD-10-CM | POA: Diagnosis not present

## 2024-08-06 DIAGNOSIS — J9602 Acute respiratory failure with hypercapnia: Secondary | ICD-10-CM | POA: Diagnosis not present

## 2024-08-06 LAB — CBC
HCT: 37.7 % (ref 36.0–46.0)
Hemoglobin: 12.5 g/dL (ref 12.0–15.0)
MCH: 30.8 pg (ref 26.0–34.0)
MCHC: 33.2 g/dL (ref 30.0–36.0)
MCV: 92.9 fL (ref 80.0–100.0)
Platelets: 177 K/uL (ref 150–400)
RBC: 4.06 MIL/uL (ref 3.87–5.11)
RDW: 11.9 % (ref 11.5–15.5)
WBC: 8.5 K/uL (ref 4.0–10.5)
nRBC: 0 % (ref 0.0–0.2)

## 2024-08-06 LAB — BASIC METABOLIC PANEL WITH GFR
Anion gap: 8 (ref 5–15)
BUN: 17 mg/dL (ref 6–20)
CO2: 28 mmol/L (ref 22–32)
Calcium: 9.1 mg/dL (ref 8.9–10.3)
Chloride: 106 mmol/L (ref 98–111)
Creatinine, Ser: 0.52 mg/dL (ref 0.44–1.00)
GFR, Estimated: >60 mL/min
Glucose, Bld: 125 mg/dL — ABNORMAL HIGH (ref 70–99)
Potassium: 4 mmol/L (ref 3.5–5.1)
Sodium: 141 mmol/L (ref 135–145)

## 2024-08-06 LAB — GLUCOSE, CAPILLARY
Glucose-Capillary: 131 mg/dL — ABNORMAL HIGH (ref 70–99)
Glucose-Capillary: 153 mg/dL — ABNORMAL HIGH (ref 70–99)
Glucose-Capillary: 137 mg/dL — ABNORMAL HIGH (ref 70–99)
Glucose-Capillary: 109 mg/dL — ABNORMAL HIGH (ref 70–99)
Glucose-Capillary: 143 mg/dL — ABNORMAL HIGH (ref 70–99)

## 2024-08-06 LAB — MAGNESIUM: Magnesium: 1.9 mg/dL (ref 1.7–2.4)

## 2024-08-06 LAB — PHOSPHORUS: Phosphorus: 3.1 mg/dL (ref 2.5–4.6)

## 2024-08-06 MED ORDER — QUETIAPINE FUMARATE 200 MG PO TABS
400.0000 mg | ORAL_TABLET | Freq: Every day | ORAL | Status: DC
  Administered 2024-08-06 – 2024-08-07 (×2): 400 mg via ORAL
  Filled 2024-08-06: qty 2
  Filled 2024-08-06: qty 4

## 2024-08-06 MED ORDER — POLYETHYLENE GLYCOL 3350 17 G PO PACK: 17.0000 g | PACK | Freq: Every day | ORAL | Status: DC | PRN

## 2024-08-06 MED ORDER — OXYCODONE HCL 5 MG PO TABS
5.0000 mg | ORAL_TABLET | Freq: Four times a day (QID) | ORAL | Status: DC | PRN
  Administered 2024-08-06 – 2024-08-07 (×4): 5 mg via ORAL
  Filled 2024-08-06 (×4): qty 1

## 2024-08-06 MED ORDER — SENNA 8.6 MG PO TABS: 1.0000 | ORAL_TABLET | Freq: Two times a day (BID) | ORAL | Status: DC | PRN

## 2024-08-06 MED ORDER — CLONIDINE HCL 0.1 MG PO TABS
0.1000 mg | ORAL_TABLET | Freq: Three times a day (TID) | ORAL | Status: DC
  Administered 2024-08-06 – 2024-08-07 (×3): 0.1 mg via ORAL
  Filled 2024-08-06 (×3): qty 1

## 2024-08-06 MED ORDER — GABAPENTIN 300 MG PO CAPS
600.0000 mg | ORAL_CAPSULE | Freq: Three times a day (TID) | ORAL | Status: DC
  Administered 2024-08-06 – 2024-08-08 (×6): 600 mg via ORAL
  Filled 2024-08-06 (×6): qty 2

## 2024-08-06 MED ORDER — NICOTINE 21 MG/24HR TD PT24
21.0000 mg | MEDICATED_PATCH | Freq: Every day | TRANSDERMAL | Status: DC
  Administered 2024-08-06 – 2024-08-07 (×2): 21 mg via TRANSDERMAL
  Filled 2024-08-06 (×3): qty 1

## 2024-08-06 NOTE — Plan of Care (Signed)
  Problem: Activity: Goal: Ability to tolerate increased activity will improve Outcome: Progressing   Problem: Respiratory: Goal: Ability to maintain a clear airway and adequate ventilation will improve Outcome: Progressing   Problem: Role Relationship: Goal: Method of communication will improve Outcome: Progressing   Problem: Education: Goal: Knowledge of General Education information will improve Description: Including pain rating scale, medication(s)/side effects and non-pharmacologic comfort measures Outcome: Progressing   Problem: Health Behavior/Discharge Planning: Goal: Ability to manage health-related needs will improve Outcome: Progressing   Problem: Clinical Measurements: Goal: Ability to maintain clinical measurements within normal limits will improve Outcome: Progressing Goal: Will remain free from infection Outcome: Progressing Goal: Diagnostic test results will improve Outcome: Progressing Goal: Respiratory complications will improve Outcome: Progressing Goal: Cardiovascular complication will be avoided Outcome: Progressing   Problem: Activity: Goal: Risk for activity intolerance will decrease Outcome: Progressing   Problem: Nutrition: Goal: Adequate nutrition will be maintained Outcome: Progressing   Problem: Coping: Goal: Level of anxiety will decrease Outcome: Progressing   Problem: Elimination: Goal: Will not experience complications related to bowel motility Outcome: Progressing Goal: Will not experience complications related to urinary retention Outcome: Progressing   Problem: Pain Managment: Goal: General experience of comfort will improve and/or be controlled Outcome: Progressing   Problem: Safety: Goal: Ability to remain free from injury will improve Outcome: Progressing   Problem: Skin Integrity: Goal: Risk for impaired skin integrity will decrease Outcome: Progressing

## 2024-08-06 NOTE — Discharge Summary (Signed)
 Physician Discharge Summary  Patient ID: Andrea Sanford MRN: [id number] DOB/AGE: 1971/01/09 54 y.o.  Admit date: 07/30/2024 Discharge date: 08/06/2024  Admission Diagnoses:COCAINE TOXICITY  Discharge Diagnoses:  Principal Problem:   Cocaine abuse with intoxication [facility])   Discharged Condition: fair  Hospital Course:  INTUBATED FOR COCAINE TOXICITY AND ENCEPHALOPATHY EXTUBATED, NOW ALERT AND AWAKE ORIENTED x 4 NO HOMICIDAL OR SUICIDAL IDEATIONS  Treatments: VENT SUPPORT PRECEDEX , PHENOBARB  Discharge Exam: Blood pressure 120/85, pulse 62, temperature 97.7 F (36.5 C), temperature source Axillary, resp. rate 15, height 5' 6 (1.676 m), weight 81.5 kg, SpO2 93%.  Physical Examination:  General Appearance: No distress  EYES EOM intact.   NECK Supple, No JVD Pulmonary: normal breath sounds, No wheezing.  CardiovascularNormal S1,S2.  No m/r/g.   Ext pulses intact, cap refill intact  ALL OTHER ROS ARE NEGATIVE   Disposition:    Allergies as of 08/06/2024   No Known Allergies      Medication List     TAKE these medications    Accu-Chek Guide Test test strip Generic drug: glucose blood 1 each by Other route 2 (two) times daily.   albuterol  108 (90 Base) MCG/ACT inhaler Commonly known as: VENTOLIN  HFA Inhale 2 puffs into the lungs every 6 (six) hours as needed for wheezing.   amLODipine  5 MG tablet Commonly known as: NORVASC  Take 1 tablet (5 mg total) by mouth daily.   ARTHRITIS STRENGTH BC POWDER PO Take 3 packets by mouth every 6 (six) hours as needed (For pain).   atorvastatin  40 MG tablet Commonly known as: LIPITOR Take 1 tablet (40 mg total) by mouth daily.   baclofen  10 MG tablet Commonly known as: LIORESAL  Take 1 tablet (10 mg total) by mouth 3 (three) times daily.   cetirizine  10 MG tablet Commonly known as: ZYRTEC  Take 1 tablet (10 mg total) by mouth daily.   doxycycline  100 MG capsule Commonly known as: VIBRAMYCIN  Take 1 capsule (100 mg  total) by mouth 2 (two) times daily.   gabapentin  300 MG capsule Commonly known as: NEURONTIN  Take 2 capsules (600 mg total) by mouth 3 (three) times daily.   lactulose  10 GM/15ML solution Commonly known as: CHRONULAC  Take 15 mLs (10 g total) by mouth 2 (two) times daily.   lidocaine  5 % Commonly known as: Lidoderm  Place 1 patch onto the skin daily. Remove & Discard patch within 12 hours or as directed by MD   meloxicam  15 MG tablet Commonly known as: MOBIC  Take 1 tablet (15 mg total) by mouth daily.   montelukast  10 MG tablet Commonly known as: SINGULAIR  Take 1 tablet (10 mg total) by mouth at bedtime.   nicotine  21 mg/24hr patch Commonly known as: NICODERM CQ  - dosed in mg/24 hours Place 1 patch (21 mg total) onto the skin daily.   predniSONE  10 MG (48) Tbpk tablet Commonly known as: STERAPRED UNI-PAK 48 TAB Take by mouth daily. 12-day taper pack, use as directed for taper   QUEtiapine  400 MG tablet Commonly known as: SEROquel  Take 1 tablet (400 mg total) by mouth at bedtime.         Signed: Meira Wahba 08/06/2024, 8:19 AM

## 2024-08-06 NOTE — Progress Notes (Signed)
 PHARMACY CONSULT NOTE - ELECTROLYTES  Pharmacy Consult for Electrolyte Monitoring and Replacement   Recent Labs: Height: 5' 6 (167.6 cm) Weight: 81.5 kg (179 lb 10.8 oz) IBW/kg (Calculated) : 59.3 Estimated Creatinine Clearance: 85.5 mL/min (by C-G formula based on SCr of 0.52 mg/dL). Potassium (mmol/L)  Date Value  08/06/2024 4.0  10/15/2012 3.0 (L)   Magnesium  (mg/dL)  Date Value  98/86/7973 1.9  10/04/2011 1.4 (L)   Calcium  (mg/dL)  Date Value  98/86/7973 9.1   Calcium , Total (mg/dL)  Date Value  96/75/7985 8.3 (L)   Albumin (g/dL)  Date Value  98/87/7973 3.2 (L)  10/03/2023 4.0  10/15/2012 3.9   Phosphorus (mg/dL)  Date Value  98/86/7973 3.1   Sodium (mmol/L)  Date Value  08/06/2024 141  10/03/2023 139  10/15/2012 141   Assessment  Andrea Sanford is a 54 y.o. female presenting with suspected drug overdose with possible baclofen  (UDS positive for cannabinoid and cocaine) requiring intubation and mechanical ventilation for airway protection. PMH significant for polysubstance abuse and bipolar disorder. Pharmacy has been consulted to monitor and replace electrolytes.  Goal of Therapy: Electrolytes WNL  Plan:  No electrolyte replacement indicated at this time Follow-up electrolytes with AM labs tomorrow  Thank you for allowing pharmacy to be a part of this patient's care.  Kushal Saunders B Chet Greenley 08/06/2024 7:43 AM
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
## 2024-08-06 NOTE — Evaluation (Signed)
 Physical Therapy Evaluation Patient Details Name: QUINLAN MCFALL MRN: [id number] DOB: 1971/05/15 Today's Date: 08/06/2024  History of Present Illness  presented to ER after being found unresponsive after drug overdose; admitted for management of severe hypoxic, hypercapnic respiratory failure and acute metabolic encephalopathy secondary to severe cocaine toxicity (requiring mechanical intubation 1/6-1/12/25)  Clinical Impression  Patient seated in recliner upon arrival to session; alert and oriented to self, location and general situation.  Follows simple commands with increased time/cuing; generally impulsive and unsafe with all functional activities.  Poor insight into deficits and overall need for assist. Bilat UE/LE strength and ROM grossly symmetrical and WFL; no focal weakness appreciated.  Does endorse generalized pain in L knee (FACES 4/10); limited tolerance for WBing without UE support.  Currently requiring min/mod assist for bed mobility; min assist for sit/stand, basic transfers and gait (150') with RW.  Demonstrates reciprocal stepping with narrowed BOS, increased sway all directions, occasional scissor stepping with turns/obstacle negotiation. LE limb advancement and placement generally inconsistent and staggered at times. Walker veers L with divided attention, but does correct with time and cuing. Generally impulsive and unaware of deficits; unsafe to attempt without +1 assist at all times.  Vitals stable and WFL on RA throughout session. Mild wheezing/whistling noted at times (? From chronically open trach site?) Would benefit from skilled PT to address above deficits and promote optimal return to PLOF.; recommend post-acute PT follow up as indicated by interdisciplinary care team.          If plan is discharge home, recommend the following: A lot of help with bathing/dressing/bathroom;A lot of help with walking and/or transfers   Can travel by private vehicle   Yes     Equipment Recommendations Rolling walker (2 wheels)  Recommendations for Other Services       Functional Status Assessment Patient has had a recent decline in their functional status and demonstrates the ability to make significant improvements in function in a reasonable and predictable amount of time.     Precautions / Restrictions Precautions Precautions: Fall Restrictions Weight Bearing Restrictions Per Provider Order: No      Mobility  Bed Mobility Overal bed mobility: Needs Assistance Bed Mobility: Sit to Supine       Sit to supine: Mod assist   General bed mobility comments: tends to impuslive crawl/roll into bed, requiring mod assist from therapist to maintain safety at extreme edge of bed    Transfers Overall transfer level: Needs assistance Equipment used: Rolling walker (2 wheels) Transfers: Sit to/from Stand Sit to Stand: Min assist, +2 safety/equipment                Ambulation/Gait Ambulation/Gait assistance: Min assist, +2 safety/equipment Gait Distance (Feet): 150 Feet Assistive device: Rolling walker (2 wheels)         General Gait Details: reciprocal stepping with narrowed BOS, increased sway all directions, occasional scissor stepping with turns/obstacle negotiation.  LE limb advancement and placement generally inconsistent and staggered at times.  Walker veers L with divided attention, but does correct with time and cuing.  Generally impulsive and unaware of deficits; unsafe to attempt without +1 assist at all times.  Stairs            Wheelchair Mobility     Tilt Bed    Modified Rankin (Stroke Patients Only)       Balance Overall balance assessment: Needs assistance Sitting-balance support: No upper extremity supported, Feet supported Sitting balance-Leahy Scale: Fair  Standing balance support: Bilateral upper extremity supported Standing balance-Leahy Scale: Fair                                Pertinent Vitals/Pain Pain Assessment Pain Assessment: Faces Faces Pain Scale: Hurts little more Pain Location: L knee Pain Descriptors / Indicators: Aching, Grimacing, Guarding Pain Intervention(s): Limited activity within patient's tolerance, Monitored during session, Repositioned    Home Living                     Additional Comments: Per patient, was living under abandoned home; unclear of discharge plan/location at this time    Prior Function Prior Level of Function : Patient poor historian/Family not available             Mobility Comments: Per patient, ambulatory without assist device at baseline       Extremity/Trunk Assessment   Upper Extremity Assessment Upper Extremity Assessment: Generalized weakness (grossly 4/5 throughout)    Lower Extremity Assessment Lower Extremity Assessment: Generalized weakness (grossly 4-/5 throughout; no focal weakness appreciated.  Limited tolerance for L LE WBing when RW removed)    Cervical / Trunk Assessment Cervical / Trunk Assessment:  (previous trach site with small, chronically open area)  Communication   Communication Communication:  (speech intermittently dysarthric and garbled; effectively communicates basic wants/needs with increased time/intention)    Cognition Arousal: Alert Behavior During Therapy: WFL for tasks assessed/performed, Impulsive   PT - Cognitive impairments: No family/caregiver present to determine baseline                       PT - Cognition Comments: Poor insight into deficits, safety needs; limited recall of new information; generally impulsive, requiring frequent cuing/redirection for safety Following commands: Impaired Following commands impaired: Follows one step commands inconsistently, Follows one step commands with increased time     Cueing Cueing Techniques: Gestural cues, Tactile cues, Verbal cues     General Comments      Exercises     Assessment/Plan     PT Assessment Patient needs continued PT services  PT Problem List Decreased activity tolerance;Decreased mobility;Decreased coordination;Decreased cognition;Decreased knowledge of use of DME;Decreased balance;Decreased safety awareness;Decreased knowledge of precautions       PT Treatment Interventions DME instruction;Stair training;Functional mobility training;Therapeutic activities;Therapeutic exercise;Balance training;Cognitive remediation;Patient/family education;Gait training;Neuromuscular re-education    PT Goals (Current goals can be found in the Care Plan section)  Acute Rehab PT Goals Patient Stated Goal: to get out of here PT Goal Formulation: With patient Time For Goal Achievement: 08/20/24 Potential to Achieve Goals: Fair    Frequency Min 2X/week     Co-evaluation PT/OT/SLP Co-Evaluation/Treatment: Yes Reason for Co-Treatment: Complexity of the patient's impairments (multi-system involvement);To address functional/ADL transfers;Necessary to address cognition/behavior during functional activity;For patient/therapist safety PT goals addressed during session: Mobility/safety with mobility OT goals addressed during session: ADL's and self-care       AM-PAC PT 6 Clicks Mobility  Outcome Measure Help needed turning from your back to your side while in a flat bed without using bedrails?: A Little Help needed moving from lying on your back to sitting on the side of a flat bed without using bedrails?: A Lot Help needed moving to and from a bed to a chair (including a wheelchair)?: A Little Help needed standing up from a chair using your arms (e.g., wheelchair or bedside chair)?: A Little Help needed to walk  in hospital room?: A Little Help needed climbing 3-5 steps with a railing? : A Lot 6 Click Score: 16    End of Session Equipment Utilized During Treatment: Gait belt Activity Tolerance: Patient tolerated treatment well Patient left: with call bell/phone within  reach;in bed;with bed alarm set;with nursing/sitter in room Nurse Communication: Mobility status PT Visit Diagnosis: Muscle weakness (generalized) (M62.81);Difficulty in walking, not elsewhere classified (R26.2)    Time: 8580-8567 PT Time Calculation (min) (ACUTE ONLY): 13 min   Charges:   PT Evaluation $PT Eval Moderate Complexity: 1 Mod   PT General Charges $$ ACUTE PT VISIT: 1 Visit         Luster Hechler H. Delores, PT, DPT, NCS 08/06/2024, 3:30 PM [phone]

## 2024-08-06 NOTE — Evaluation (Signed)
 Occupational Therapy Evaluation Patient Details Name: Andrea Sanford MRN: [id number] DOB: 1970-11-13 Today's Date: 08/06/2024   History of Present Illness   presented to ER after being found unresponsive after drug overdose; admitted for management of severe hypoxic, hypercapnic respiratory failure and acute metabolic encephalopathy secondary to severe cocaine toxicity (requiring mechanical intubation 1/6-1/12/25)     Clinical Impressions Chart reviewed to date, pt greeted in chair, alert and oriented x4, agreeable to OT evaluatino. Pt is impulsive and has poor insight into current level of functioning. Frequent vcs throughout for safety/technique. PTA pt reports she is MOD I-I in ADL/IADL, amb with no AD. Pt presents with deficits in strength, endurance, activity tolerance, balance, cognition, affecting safe and optimal ADL completion. MIN A +1-2 required for STS with RW, MIN A +2 for chair follow with RW required to amb approx 150'. Frequent vcs for technique/safety with poor attention to task with increased external stimuli. Supervision for feeding, anticipate supervision for grooming tasks. Pt is performing ADL/functional mobility below PLOF, will benefit from acute OT to address functional deficits and to facilitate optimal ADL/functional mobility. Pt is left in bed, all needs met. OT will follow.   VSS on RA throughout      If plan is discharge home, recommend the following:   A lot of help with walking and/or transfers;A lot of help with bathing/dressing/bathroom;Supervision due to cognitive status     Functional Status Assessment   Patient has had a recent decline in their functional status and demonstrates the ability to make significant improvements in function in a reasonable and predictable amount of time.     Equipment Recommendations   BSC/3in1 (2WW)     Recommendations for Other Services         Precautions/Restrictions   Precautions Precautions:  Fall Recall of Precautions/Restrictions: Impaired Restrictions Weight Bearing Restrictions Per Provider Order: No     Mobility Bed Mobility Overal bed mobility: Needs Assistance Bed Mobility: Sit to Supine       Sit to supine: Mod assist   General bed mobility comments: attempting to crawl/roll into bed    Transfers Overall transfer level: Needs assistance Equipment used: Rolling walker (2 wheels) Transfers: Sit to/from Stand Sit to Stand: Min assist, +2 safety/equipment                  Balance Overall balance assessment: Needs assistance Sitting-balance support: No upper extremity supported, Feet supported Sitting balance-Leahy Scale: Fair     Standing balance support: Bilateral upper extremity supported, During functional activity, Reliant on assistive device for balance Standing balance-Leahy Scale: Fair                             ADL either performed or assessed with clinical judgement   ADL Overall ADL's : Needs assistance/impaired Eating/Feeding: Supervision/ safety                   Lower Body Dressing: Minimal assistance Lower Body Dressing Details (indicate cue type and reason): anticipate, fixed socks with supervision Toilet Transfer: Minimal assistance;Rolling walker (2 wheels);Ambulation;Cueing for sequencing;Cueing for safety Toilet Transfer Details (indicate cue type and reason): simulated         Functional mobility during ADLs: Minimal assistance;+2 for safety/equipment;Rolling walker (2 wheels);Cueing for safety;Cueing for sequencing (approx 150')       Vision Baseline Vision/History: 1 Wears glasses Patient Visual Report: No change from baseline       Perception  Praxis         Pertinent Vitals/Pain Pain Assessment Pain Assessment: CPOT Facial Expression: Relaxed, neutral Body Movements: Absence of movements Muscle Tension: Relaxed Compliance with ventilator (intubated pts.): N/A Vocalization  (extubated pts.): Sighing, moaning CPOT Total: 1 Pain Location: L knee Pain Descriptors / Indicators: Aching, Grimacing, Guarding Pain Intervention(s): Monitored during session, Limited activity within patient's tolerance, Repositioned     Extremity/Trunk Assessment Upper Extremity Assessment Upper Extremity Assessment: Generalized weakness;Right hand dominant (grossly 4/5 throughout)   Lower Extremity Assessment Lower Extremity Assessment: Generalized weakness   Cervical / Trunk Assessment Cervical / Trunk Assessment:  (pevious trach site with small open area)   Communication Communication Communication: Impaired Factors Affecting Communication: Reduced clarity of speech (garbled at times)   Cognition Arousal: Alert Behavior During Therapy: Impulsive, Lability Cognition: Cognition impaired     Awareness: Online awareness impaired (emerging, poor insight into deficits) Memory impairment (select all impairments): Working memory Attention impairment (select first level of impairment): Sustained attention Executive functioning impairment (select all impairments): Organization, Sequencing, Reasoning, Problem solving OT - Cognition Comments: will continue to assess                 Following commands: Impaired Following commands impaired: Follows one step commands inconsistently, Follows one step commands with increased time (required multi modal cueing)     Cueing  General Comments   Cueing Techniques: Gestural cues;Tactile cues;Verbal cues  previous trach site with small opening/intermittent wheezing from site, nurse aware   Exercises Other Exercises Other Exercises: edu re role of OT, role of rehab, discharge recommendations   Shoulder Instructions      Home Living Family/patient expects to be discharged to:: Unsure                                 Additional Comments: per patient, was living under abandoned home      Prior  Functioning/Environment Prior Level of Function : Patient poor historian/Family not available             Mobility Comments: per pt, amb with no AD, was supposed to get a rollator but did not pick up from Dr office ADLs Comments: pt reports she is generally MOD I-i in ADL/IADL, will go to her moms to wash clothes    OT Problem List: Decreased strength;Decreased activity tolerance;Impaired balance (sitting and/or standing);Decreased knowledge of use of DME or AE;Decreased safety awareness;Decreased cognition   OT Treatment/Interventions: Self-care/ADL training;Therapeutic exercise;Energy conservation;DME and/or AE instruction;Cognitive remediation/compensation;Therapeutic activities;Patient/family education;Balance training      OT Goals(Current goals can be found in the care plan section)   Acute Rehab OT Goals Patient Stated Goal: go home OT Goal Formulation: With patient Time For Goal Achievement: 08/20/24 Potential to Achieve Goals: Fair ADL Goals Pt Will Perform Grooming: with modified independence;sitting;standing Pt Will Perform Lower Body Dressing: with modified independence;sitting/lateral leans;sit to/from stand Pt Will Transfer to Toilet: with modified independence;ambulating Pt Will Perform Toileting - Clothing Manipulation and hygiene: with modified independence;sitting/lateral leans;sit to/from stand   OT Frequency:  Min 2X/week    Co-evaluation   Reason for Co-Treatment: Complexity of the patient's impairments (multi-system involvement);To address functional/ADL transfers;Necessary to address cognition/behavior during functional activity;For patient/therapist safety PT goals addressed during session: Mobility/safety with mobility OT goals addressed during session: ADL's and self-care      AM-PAC OT 6 Clicks Daily Activity     Outcome Measure Help from another person eating meals?:  None Help from another person taking care of personal grooming?: A  Little Help from another person toileting, which includes using toliet, bedpan, or urinal?: A Lot Help from another person bathing (including washing, rinsing, drying)?: A Lot Help from another person to put on and taking off regular upper body clothing?: A Little Help from another person to put on and taking off regular lower body clothing?: A Lot 6 Click Score: 16   End of Session Equipment Utilized During Treatment: Rolling walker (2 wheels) Nurse Communication: Mobility status  Activity Tolerance: Patient tolerated treatment well Patient left: in bed;with call bell/phone within reach;with bed alarm set;with nursing/sitter in room  OT Visit Diagnosis: Other abnormalities of gait and mobility (R26.89);Other symptoms and signs involving cognitive function                Time: 8590-8567 OT Time Calculation (min): 23 min Charges:  OT General Charges $OT Visit: 1 Visit OT Evaluation $OT Eval High Complexity: 1 High  Therisa Sheffield, OTD OTR/L  08/06/2024, 3:50 PM

## 2024-08-06 NOTE — Consult Note (Signed)
 [facility] Psychiatric Consult Initial  Patient Name: .Andrea Sanford  MRN: [id number]  DOB: 1970-07-28  Consult Order details:  Orders (From admission, onward)     Start     Ordered   07/30/24 1636  IP CONSULT TO PSYCHIATRY       Comments: Possible overdose, intent unknown  Ordering Provider: Dicky Anes, MD  Provider:  (Not yet assigned)  Question:  Reason for consult:  Answer:  Medication management   07/30/24 1636             Mode of Visit: In person    Psychiatry Consult Evaluation  Service Date: August 06, 2024 LOS:  LOS: 7 days  Chief Complaint I took a bunch of muscle relaxers  Primary Psychiatric Diagnoses  Cocaine abuse with intoxication (HCC) Intentional overdose   Assessment   Patient is recommended for inpatient psychiatric admission based on recent serious suicide attempt via intentional overdose on muscle relaxers with clear suicidal intent (I hate myself and did not have any reason to go on), ongoing passive suicidal ideation and profound hopelessness as evidenced by statement I just do not care, untreated major depressive disorder for approximately 6 months since psychiatrist quit with no established psychiatric follow-up, likely worsening of depression following reported abrupt discontinuation of Effexor  in September 2025, active substance use disorder with crack cocaine, THC, and nicotine  use complicating psychiatric presentation, history of alcohol use disorder currently in remission approximately one year, and lack of outpatient psychiatric support or medication management to maintain safety in community.  Patient is currently medically hospitalized.  Psychiatry will continue to round to determine appropriateness for inpatient psychiatric level of care while patient medically hospitalized.    Diagnoses:  Active Hospital problems: Principal Problem:   Cocaine abuse with intoxication [facility])    Plan   ## Psychiatric Medication  Recommendations:  Pending medical stabilization for further recommendations  ## Medical Decision Making Capacity: Not specifically addressed in this encounter  ## Further Work-up:   -- most recent EKG on 07/30/2024 had QtC of 473 -- Pertinent labwork reviewed earlier this admission includes: Magnesium  CBC, BMP   ## Disposition:--Patient is recommended for inpatient psychiatric admission pending medical stabilization.  Psychiatry will continue to round on patient daily as patient is currently medically hospitalized.  If patient attempts to leave the facility however, recommended to IVC patient due to intentional overdose attempt.  ## Behavioral / Environmental: - No specific recommendations at this time.     ## Safety and Observation Level:  - Based on my clinical evaluation, I estimate the patient to be at moderate risk of self harm in the current setting. - At this time, we recommend  1:1 Observation. This decision is based on my review of the chart including patient's history and current presentation, interview of the patient, mental status examination, and consideration of suicide risk including evaluating suicidal ideation, plan, intent, suicidal or self-harm behaviors, risk factors, and protective factors. This judgment is based on our ability to directly address suicide risk, implement suicide prevention strategies, and develop a safety plan while the patient is in the clinical setting. Please contact our team if there is a concern that risk level has changed.  CSSR Risk Category:   Suicide Risk Assessment: Patient has following modifiable risk factors for suicide: under treated depression , current symptoms: anxiety/panic, insomnia, impulsivity, anhedonia, hopelessness, and triggering events, which we are addressing by recommending inpatient psychiatric admission pending medical stabilization. Patient has following non-modifiable or demographic risk factors for suicide:  N/A Patient has  the following protective factors against suicide: Supportive family  Thank you for this consult request. Recommendations have been communicated to the primary team.  We will continue to round and assess patient's appropriateness for inpatient psychiatric admission at this time.   Andrea Sharps, NP        History of Present Illness  Relevant Aspects of [facility]   Patient Report:   Medical team noted patient was alert and oriented post-extubation, allowing for psychiatric evaluation. Patient reported to the psychiatry team that she took a bunch of her muscle relaxer in an intentional suicide attempt. She stated I hate myself and I did not have any reason to go on. Patient reported she feels she has struggled with depression all of her life.  Patient reported she was previously aligned with a psychiatrist, however, they quit approximately 6 months ago and she has not been able to follow up with anyone for psychiatric care since that time. Patient reported previous inpatient detox stays in [place]. Patient expressed significant dissatisfaction that she was stopped off her antidepressant medication Effexor  cold turkey in September 2025. Patient denied any intentional overdose on Effexor  and stated this medication worked the best for her depression. She was unsure why this medication was discontinued.  When asked about current suicidal ideation, patient stated I just do not care, which represents passive suicidal ideation and ongoing hopelessness. Patient denied current homicidal ideation. She denied auditory or visual hallucinations. Patient reported previous alcohol use and stated I used to be a bad drunk but reported she has not consumed alcohol in approximately one year. Patient did endorse current crack cocaine use as well as THC and nicotine  use. Patient reported she has a court date today for a legal charge and stated she is currently trying to call the court to inform them of  her current health status and inability to make this court date. Patient denied current access to firearms. Patient provided permission for the psychiatry team to contact her mother Roderick at phone number [phone].     Psychiatric and Social History  Psychiatric History:  Information collected from patient/chart review  Prev Dx/Sx: Polysubstance abuse, mentioned history and chart review of bipolar disorder Current Psych Provider: None currently, patient reported their psychiatrist retired about 6 months ago Home Meds (current): Patient denied current medications Previous Med Trials: Effexor  which patient reported worked well for her but reported a previous provider quit prescribing cold turkey reported the last time she took this medication she believes was in September 2025 Therapy: None currently  Prior Psych Hospitalization: Yes Prior Violence: Denied  Family Psych History: Unsure Family Hx suicide: Unsure  Social History:   Legal Hx: Denied Living Situation: With mother  Access to weapons/lethal means: Denied access to firearms  Substance History Alcohol: Endorsed, but reported has been sober for about a year Tobacco: Endorsed Illicit drugs: Crack use at least weekly as well as THC Prescription drug abuse: Denied Rehab hx: Endorsed  Exam Findings  Physical Exam: Reviewed and agree with the physical exam findings conducted by the medical provider Vital Signs:  Temp:  [97.5 F (36.4 C)-97.8 F (36.6 C)] 97.7 F (36.5 C) (01/13 0316) Pulse Rate:  [25-79] 25 (01/13 0900) Resp:  [10-30] 18 (01/13 0900) BP: (93-164)/(63-116) 93/76 (01/13 0830) SpO2:  [51 %-100 %] 93 % (01/13 0900) FiO2 (%):  [24 %] 24 % (01/12 1215) Weight:  [81.5 kg] 81.5 kg (01/13 0531) Blood pressure 93/76, pulse (!) 25, temperature 97.7 F (  36.5 C), temperature source Axillary, resp. rate 18, height 5' 6 (1.676 m), weight 81.5 kg, SpO2 93%. Body mass index is 29 kg/m.        Other  History   These have been pulled in through the EMR, reviewed, and updated if appropriate.  Family History:  The patient's family history includes Cancer in her father; Depression in her mother; Diabetes in her mother.  Medical History: Past Medical History:  Diagnosis Date   Allergy    Anxiety    Arthritis    Asthma    Bipolar 1 disorder (HCC)    Cancer (HCC)    COPD (chronic obstructive pulmonary disease) (HCC)    Depression    Diabetes mellitus without complication (HCC)    Drug abuse (HCC)    Hypertension    Neuromuscular disorder (HCC)    Osteoporosis    PTSD (post-traumatic stress disorder) 04/07/2024   Sleep apnea     Surgical History: Past Surgical History:  Procedure Laterality Date   ANKLE SURGERY Right    IR GASTROSTOMY TUBE MOD SED  09/04/2023   IR GASTROSTOMY TUBE REMOVAL  12/13/2023   IR PATIENT EVAL TECH 0-60 MINS  11/03/2023   PERIPHERAL VASCULAR THROMBECTOMY Left 1991   TRACHEOSTOMY TUBE PLACEMENT N/A 09/01/2023   Procedure: TRACHEOSTOMY;  Surgeon: Blair Mt, MD;  Location: [facility] ORS;  Service: ENT;  Laterality: N/A;     Medications:  Current Medications[1]  Allergies: Allergies[2]  Andrea Sharps, NP This note was created using Dragon dictation software. Please excuse any inadvertent transcription errors. Case was discussed with supervising physician Dr. Jadapalle who is agreeable with current plan.      [1]  Current Facility-Administered Medications:    Chlorhexidine  Gluconate Cloth 2 % PADS 6 each, 6 each, Topical, Daily, Keene, Jeremiah D, NP, 6 each at 08/06/24 0815   cloNIDine  (CATAPRES ) tablet 0.1 mg, 0.1 mg, Oral, TID, Kasa, Kurian, MD   enoxaparin  (LOVENOX ) injection 40 mg, 40 mg, Subcutaneous, QHS, Keene, Jeremiah D, NP, 40 mg at 08/05/24 2123   gabapentin  (NEURONTIN ) capsule 600 mg, 600 mg, Oral, TID, Kasa, Kurian, MD   hydrALAZINE  (APRESOLINE ) injection 10-20 mg, 10-20 mg, Intravenous, Q6H PRN, Rust-Chester, Jenita L, NP    ipratropium-albuterol  (DUONEB) 0.5-2.5 (3) MG/3ML nebulizer solution 3 mL, 3 mL, Nebulization, Q4H PRN, Keene, Jeremiah D, NP   nicotine  (NICODERM CQ  - dosed in mg/24 hours) patch 21 mg, 21 mg, Transdermal, Daily, Nelson, Dana G, NP, 21 mg at 08/06/24 9053   nutrition supplement (JUVEN) (JUVEN) powder packet 1 packet, 1 packet, Per Tube, BID BM, Kasa, Kurian, MD, 1 packet at 08/05/24 9178   Oral care mouth rinse, 15 mL, Mouth Rinse, PRN, Kasa, Kurian, MD   Oral care mouth rinse, 15 mL, Mouth Rinse, 4 times per day, Isaiah Scrivener, MD, 15 mL at 08/06/24 0815   Oral care mouth rinse, 15 mL, Mouth Rinse, PRN, Isaiah Scrivener, MD   oxyCODONE  (Oxy IR/ROXICODONE ) immediate release tablet 5 mg, 5 mg, Oral, Q6H PRN, Kasa, Kurian, MD   pantoprazole  (PROTONIX ) injection 40 mg, 40 mg, Intravenous, Daily, Keene, Jeremiah D, NP, 40 mg at 08/06/24 9185   polyethylene glycol (MIRALAX  / GLYCOLAX ) packet 17 g, 17 g, Oral, Daily PRN, Kasa, Kurian, MD   QUEtiapine  (SEROQUEL ) tablet 400 mg, 400 mg, Oral, QHS, Kasa, Kurian, MD   senna (SENOKOT) tablet 8.6 mg, 1 tablet, Oral, BID PRN, Kasa, Kurian, MD   thiamine  (VITAMIN B1) injection 100 mg, 100 mg, Intravenous, Daily, 100 mg at  08/05/24 2122 **OR** thiamine  (VITAMIN B1) tablet 100 mg, 100 mg, Oral, Daily, Rust-Chester, Britton L, NP, 100 mg at 08/06/24 0814 [2] No Known Allergies

## 2024-08-06 NOTE — Progress Notes (Signed)
 Nutrition Follow Up Note   DOCUMENTATION CODES:   Not applicable  INTERVENTION:   Ensure Plus High Protein po TID, each supplement provides 350 kcal and 20 grams of protein  MVI po daily   Daily weights   NUTRITION DIAGNOSIS:   Inadequate oral intake related to inability to eat (pt sedated and ventilated) as evidenced by NPO status. -resolving   GOAL:   Patient will meet greater than or equal to 90% of their needs -progressing   MONITOR:   PO intake, Supplement acceptance, Labs, Weight trends, Skin, I & O's  ASSESSMENT:   54 y/o female with h/o DM, bipolar disorder, substance abuse, HTN, neuromuscular disorder, osteoporosis, anxiety, depression, COPD, OSA, chronic right clavicle fracture, homelessness and a lengthy admission for overdose complicated by aspiration PNA (requiring IR G-tube placed 09/04/23 & removed 12/13/23) and tracheostomy (placed 09/01/2023 & removed 11/06/23) and who is now admitted with metabolic encephalopathy secondary to cocaine toxicity requiring intubation for airway protection.  Pt extubated yesterday and initiated on a regular diet today. Pt with fairly good appetite and oral intake in hospital; pt eating 85% of meals today. RD will add supplements and vitamins to help pt meet her estimated needs. Per chart, pt is up ~3lbs since admission. Pt +6.1L on her I & Os. Pt wanting to leave AMA. Psychiatry evaluated pt today and recommended inpatient psychiatric admission based on her recent serious suicide attempt via intentional overdose.   Medications reviewed and include: lovenox , nicotine , thiamine   Labs reviewed: K 4.0 wnl, P 3.1 wnl, Mg 1.9 wnl  Cbgs- 109, 137, 153 x 24 hrs   UOP-   NUTRITION - FOCUSED PHYSICAL EXAM:  Flowsheet Row Most Recent Value  Orbital Region No depletion  Upper Arm Region Mild depletion  Thoracic and Lumbar Region No depletion  Buccal Region No depletion  Temple Region Mild depletion  Clavicle Bone Region Mild  depletion  Clavicle and Acromion Bone Region Mild depletion  Scapular Bone Region No depletion  Dorsal Hand No depletion  Patellar Region Mild depletion  Anterior Thigh Region Moderate depletion  Posterior Calf Region Moderate depletion  Edema (RD Assessment) Mild  Hair Reviewed  Eyes Reviewed  Mouth Reviewed  Skin Reviewed  Nails Reviewed   Diet Order:   Diet Order             Diet regular Room service appropriate? Yes; Fluid consistency: Thin  Diet effective now                  EDUCATION NEEDS:   No education needs have been identified at this time  Skin:  Skin Assessment: Reviewed RN Assessment (Stage I sacrum)  Last BM:  1/13- type 3  Height:   Ht Readings from Last 1 Encounters:  07/30/24 5' 6 (1.676 m)    Weight:   Wt Readings from Last 1 Encounters:  08/06/24 81.5 kg    Ideal Body Weight:  59 kg  BMI:  Body mass index is 29 kg/m.  Estimated Nutritional Needs:   Kcal:  1900-2200kcal/day  Protein:  95-110g/day  Fluid:  1.8-2.1L/day  Augustin Shams MS, RD, LDN If unable to be reached, please send secure chat to RD inpatient available from 8:00a-4:00p daily

## 2024-08-06 NOTE — Progress Notes (Signed)
 "  NAME:  Andrea Sanford, MRN:  [id number], DOB:  16-Mar-1971, LOS: 7 ADMISSION DATE:  07/30/2024, CONSULTATION DATE:  07/30/2024 REFERRING MD:  Dr. Dicky,   CHIEF COMPLAINT:  Unresponsive, suspected drug overdose  COCAINE TOXICITY  Brief Pt Description / Synopsis:  54 year old female with past medical history significant for polysubstance abuse and bipolar disorder who presented after being found unresponsive in the setting of suspected drug overdose with possible baclofen  (UDS positive for cannabinoid and cocaine) requiring intubation and mechanical ventilation for airway protection.  History of Present Illness:  Andrea Sanford is a 54 year old female with a past medical history significant for polysubstance abuse, bipolar disorder, generalized anxiety disorder, hypertension, hyperlipidemia, diabetes mellitus type 2 who presents to [facility] ED on 07/30/2024 after being found unresponsive.  Patient is currently intubated and unable to contribute to history and no family is currently available, therefore history is extremely limited and is obtained from chart review.  Per ED and nursing notes, she [place] roadside via EMS after being found unresponsive with pinpoint pupils, was hypoxic along with respiratory arrest.  She was given Narcan  with improvement in respiratory status, however remained unresponsive.  EMS was told on scene that the patient's significant other had a similar event/overdose within the last hour as well.  There was concern that the patient could have taken Baclofen  as well.  No evidence of trauma.  Upon arrival to the ED she remained unresponsive with sonorous respirations for which she was intubated for airway protection.  Chest X-ray>>IMPRESSION: 1. Endotracheal tube tip projects 4 cm above the carina. 2. Enteric tube extends below the diaphragm, with the tip beyond the inferior margin of the image. 3. No acute cardiopulmonary abnormality. CT Head wo  contrast>>IMPRESSION: 1. No acute intracranial abnormality. 2. Bilateral displaced nasal bone fractures of indeterminate age. 3. Stable right maxillary sinus polyps versus mucous retention cysts. CT Cervical Spine>>IMPRESSION: 1. No acute fracture or subluxation in the cervical spine. 2. Mild to moderate severity degenerative changes at the level of C5-C6.   PCCM asked to admit for further workup and treatment.   Pertinent  Medical History   Past Medical History:  Diagnosis Date   Allergy    Anxiety    Arthritis    Asthma    Bipolar 1 disorder (HCC)    Cancer (HCC)    COPD (chronic obstructive pulmonary disease) (HCC)    Depression    Diabetes mellitus without complication (HCC)    Drug abuse (HCC)    Hypertension    Neuromuscular disorder (HCC)    Osteoporosis    PTSD (post-traumatic stress disorder) 04/07/2024   Sleep apnea     Micro Data:  1/6: COVID/flu/RSV PCR>> 1/6: MRSA PCR>>  Antimicrobials:   Anti-infectives (From admission, onward)    None       Significant Hospital Events: Including procedures, antibiotic start and stop dates in addition to other pertinent events   1/6: Presented to ED unresponsive requiring intubation for airway protection.  PCCM asked to admit 1/7 remains encephalopathic 1/8 remains encephalopathic 1/9 remains encephalopathic 1/10 remains encephalopathic 1/11 remains encephalopathic, plan for WUA 1/11 failed weaning trial 1/12 remains on vent, 1/12 extubated  Interim History / Subjective:  Extubated off oxygen Remains agitated Wean off precedex  today Phenobarb taper    Vent Mode: PSV;CPAP FiO2 (%):  [24 %] 24 % Set Rate:  [18 bmp] 18 bmp Vt Set:  [450 mL] 450 mL PEEP:  [5 cmH20] 5 cmH20 Pressure Support:  [5 cmH20]  5 cmH20 Plateau Pressure:  [12 cmH20] 12 cmH20   Objective   Blood pressure (!) 147/84, pulse 61, temperature 97.7 F (36.5 C), temperature source Axillary, resp. rate 19, height 5' 6 (1.676 m),  weight 81.5 kg, SpO2 95%.    Vent Mode: PSV;CPAP FiO2 (%):  [24 %] 24 % Set Rate:  [18 bmp] 18 bmp Vt Set:  [450 mL] 450 mL PEEP:  [5 cmH20] 5 cmH20 Pressure Support:  [5 cmH20] 5 cmH20 Plateau Pressure:  [12 cmH20] 12 cmH20   Intake/Output Summary (Last 24 hours) at 08/06/2024 0740 Last data filed at 08/06/2024 0700 Gross per 24 hour  Intake 1376.74 ml  Output 2230 ml  Net -853.26 ml   Filed Weights   08/04/24 0500 08/05/24 0413 08/06/24 0531  Weight: 82.9 kg 83.4 kg 81.5 kg     REVIEW OF SYSTEMS  PATIENT IS UNABLE TO PROVIDE COMPLETE REVIEW OF SYSTEMS DUE TO SEVERE CRITICAL ILLNESS  agitated No wheezing Soft ND No edema   Assessment & Plan:   54 yo white female with severe metabolic encephalopathy from severe cocaine toxicity leading to inability to protect airway and very high risk for aspiration   NEUROLOGY ACUTE METABOLIC ENCEPHALOPATHY COCAINE TOXICITY Weaned off precedex  Phenobarb taper Remove foley Feed patient  Respiratory Failure-resolved  ENDO - ICU hypoglycemic\Hyperglycemia protocol -check FSBS per protocol   ELECTROLYTES -follow labs as needed -replace as needed -pharmacy consultation and following     Best Practice (right click and Reselect all SmartList Selections daily)   Diet/type: NPO DVT prophylaxis: LMWH GI prophylaxis: PPI Lines: N/A Foley:  Yes, and it is still needed Code Status:  full code Last date of multidisciplinary goals of care discussion [N/A]   Labs   CBC: Recent Labs  Lab 07/30/24 1634 07/31/24 0339 08/02/24 0334 08/03/24 0404 08/04/24 0426 08/05/24 0404 08/06/24 0344  WBC 9.9   < > 7.6 6.2 6.0 5.6 8.5  NEUTROABS 7.1  --   --   --   --   --   --   HGB 13.4   < > 13.3 11.5* 11.5* 11.1* 12.5  HCT 40.2   < > 43.2 36.8 35.9* 35.0* 37.7  MCV 92.2   < > 97.5 95.1 95.0 95.1 92.9  PLT 182   < > 160 170 153 167 177   < > = values in this interval not displayed.    Basic Metabolic Panel: Recent Labs   Lab 08/01/24 0350 08/02/24 0334 08/03/24 0404 08/04/24 0426 08/05/24 0404 08/06/24 0344  NA 146* 145 143 143 142 141  K 3.7 3.7 3.9 3.7 4.3 4.0  CL 111 109 109 110 110 106  CO2 25 26 27 27 26 28   GLUCOSE 99 121* 150* 130* 159* 125*  BUN 14 15 26* 24* 25* 17  CREATININE 0.75 0.78 0.69 0.62 0.59 0.52  CALCIUM  8.8* 8.8* 8.9 8.8* 8.9 9.1  MG 2.1 2.2 2.2  --  2.0 1.9  PHOS 3.0 3.8 4.4 3.5 3.7 3.1   GFR: Estimated Creatinine Clearance: 85.5 mL/min (by C-G formula based on SCr of 0.52 mg/dL). Recent Labs  Lab 07/30/24 1635 07/31/24 0339 08/03/24 0404 08/04/24 0426 08/05/24 0404 08/06/24 0344  WBC  --    < > 6.2 6.0 5.6 8.5  LATICACIDVEN 0.6  --   --   --   --   --    < > = values in this interval not displayed.    Liver Function Tests: Recent Labs  Lab  07/30/24 1634 07/31/24 0339 08/01/24 0350 08/02/24 0334 08/03/24 0404 08/04/24 0426 08/05/24 0404  AST 20  --   --   --   --   --   --   ALT 11  --   --   --   --   --   --   ALKPHOS 110  --   --   --   --   --   --   BILITOT 0.6  --   --   --   --   --   --   PROT 6.7  --   --   --   --   --   --   ALBUMIN 4.3   < > 3.3* 3.5 3.4* 3.2* 3.2*   < > = values in this interval not displayed.   No results for input(s): LIPASE, AMYLASE in the last 168 hours. Recent Labs  Lab 07/30/24 1634  AMMONIA 31    ABG    Component Value Date/Time   PHART 7.37 07/30/2024 1636   PCO2ART 44 07/30/2024 1636   PO2ART 129 (H) 07/30/2024 1636   HCO3 25.4 07/30/2024 1636   ACIDBASEDEF 0.1 07/30/2024 1636   O2SAT 98.9 07/30/2024 1636     Coagulation Profile: Recent Labs  Lab 07/30/24 1634  INR 1.2    Cardiac Enzymes: No results for input(s): CKTOTAL, CKMB, CKMBINDEX, TROPONINI in the last 168 hours.  HbA1C: Hemoglobin A1C  Date/Time Value Ref Range Status  04/24/2024 11:08 AM 6.2 (A) 4.0 - 5.6 % Final  10/03/2023 09:43 AM 6.6 (A) 4.0 - 5.6 % Final   Hgb A1c MFr Bld  Date/Time Value Ref Range Status   09/14/2023 06:18 AM 6.8 (H) 4.8 - 5.6 % Final    Comment:    (NOTE) Pre diabetes:          5.7%-6.4%  Diabetes:              >6.4%  Glycemic control for   <7.0% adults with diabetes   06/13/2023 02:40 PM 6.3 (H) 4.8 - 5.6 % Final    Comment:             Prediabetes: 5.7 - 6.4          Diabetes: >6.4          Glycemic control for adults with diabetes: <7.0     CBG: Recent Labs  Lab 08/05/24 1116 08/05/24 1605 08/05/24 1902 08/05/24 2358 08/06/24 0335  GLUCAP 86 154* 164* 130* 131*    Treshon Stannard Alm Cellar, M.D.  [facility]  Medical Director [facility] [place]              "

## 2024-08-07 LAB — BASIC METABOLIC PANEL WITH GFR
Anion gap: 7 (ref 5–15)
BUN: 15 mg/dL (ref 6–20)
CO2: 28 mmol/L (ref 22–32)
Calcium: 8.9 mg/dL (ref 8.9–10.3)
Chloride: 106 mmol/L (ref 98–111)
Creatinine, Ser: 0.59 mg/dL (ref 0.44–1.00)
GFR, Estimated: >60 mL/min
Glucose, Bld: 157 mg/dL — ABNORMAL HIGH (ref 70–99)
Potassium: 3.8 mmol/L (ref 3.5–5.1)
Sodium: 142 mmol/L (ref 135–145)

## 2024-08-07 LAB — CBC
HCT: 34.8 % — ABNORMAL LOW (ref 36.0–46.0)
Hemoglobin: 11.7 g/dL — ABNORMAL LOW (ref 12.0–15.0)
MCH: 30.9 pg (ref 26.0–34.0)
MCHC: 33.6 g/dL (ref 30.0–36.0)
MCV: 91.8 fL (ref 80.0–100.0)
Platelets: 181 K/uL (ref 150–400)
RBC: 3.79 MIL/uL — ABNORMAL LOW (ref 3.87–5.11)
RDW: 12 % (ref 11.5–15.5)
WBC: 6.2 K/uL (ref 4.0–10.5)
nRBC: 0 % (ref 0.0–0.2)

## 2024-08-07 LAB — GLUCOSE, CAPILLARY
Glucose-Capillary: 119 mg/dL — ABNORMAL HIGH (ref 70–99)
Glucose-Capillary: 123 mg/dL — ABNORMAL HIGH (ref 70–99)
Glucose-Capillary: 140 mg/dL — ABNORMAL HIGH (ref 70–99)
Glucose-Capillary: 148 mg/dL — ABNORMAL HIGH (ref 70–99)
Glucose-Capillary: 155 mg/dL — ABNORMAL HIGH (ref 70–99)

## 2024-08-07 LAB — MAGNESIUM: Magnesium: 2 mg/dL (ref 1.7–2.4)

## 2024-08-07 LAB — PHOSPHORUS: Phosphorus: 3.6 mg/dL (ref 2.5–4.6)

## 2024-08-07 MED ORDER — ATORVASTATIN CALCIUM 20 MG PO TABS
40.0000 mg | ORAL_TABLET | Freq: Every day | ORAL | Status: DC
  Administered 2024-08-07 – 2024-08-08 (×2): 40 mg via ORAL
  Filled 2024-08-07 (×2): qty 2

## 2024-08-07 MED ORDER — OLANZAPINE 5 MG PO TABS: 5.0000 mg | ORAL_TABLET | Freq: Three times a day (TID) | ORAL | Status: DC | PRN

## 2024-08-07 MED ORDER — CLONIDINE HCL 0.1 MG PO TABS
0.1000 mg | ORAL_TABLET | Freq: Two times a day (BID) | ORAL | Status: DC
  Administered 2024-08-07 – 2024-08-08 (×2): 0.1 mg via ORAL
  Filled 2024-08-07 (×2): qty 1

## 2024-08-07 MED ORDER — CLONIDINE HCL 0.1 MG PO TABS: 0.1000 mg | ORAL_TABLET | Freq: Every day | ORAL | Status: DC

## 2024-08-07 MED ORDER — NICOTINE POLACRILEX 2 MG MT GUM
2.0000 mg | CHEWING_GUM | OROMUCOSAL | Status: DC | PRN
  Administered 2024-08-07: 2 mg via ORAL
  Filled 2024-08-07 (×2): qty 1

## 2024-08-07 MED ORDER — OLANZAPINE 10 MG IM SOLR
10.0000 mg | Freq: Two times a day (BID) | INTRAMUSCULAR | Status: DC | PRN
  Filled 2024-08-07: qty 10

## 2024-08-07 MED ORDER — VENLAFAXINE HCL ER 37.5 MG PO CP24
37.5000 mg | ORAL_CAPSULE | Freq: Every day | ORAL | Status: DC
  Administered 2024-08-08: 37.5 mg via ORAL
  Filled 2024-08-07: qty 1

## 2024-08-07 MED ORDER — OLANZAPINE 10 MG IM SOLR: 10.0000 mg | Freq: Three times a day (TID) | INTRAMUSCULAR | Status: DC | PRN

## 2024-08-07 NOTE — Progress Notes (Addendum)
 " PROGRESS NOTE    Andrea Sanford  FMW:[id number] DOB: 10/02/1970 DOA: 07/30/2024 PCP: Ziglar, Susan K, MD  Subjective: No acute events overnight. Seen and examined at bedside during PT session. Wondering when she can go home. Reports tolerating PO well. Denies nausea, vomiting, constipation.   Hospital Course:  54 year old female with a past medical history significant for polysubstance abuse, bipolar disorder, generalized anxiety disorder, hypertension, hyperlipidemia, diabetes mellitus type 2 who presents to [facility] ED on 07/30/2024 after being found unresponsive.  Patient is currently intubated and unable to contribute to history and no family is currently available, therefore history is extremely limited and is obtained from chart review.   Per ED and nursing notes, she was [place] roadside via EMS after being found unresponsive with pinpoint pupils, was hypoxic along with respiratory arrest.  She was given Narcan  with improvement in respiratory status, however remained unresponsive.  EMS was told on scene that the patient's significant other had a similar event/overdose within the last hour as well.  There was concern that the patient could have taken Baclofen  as well.  No evidence of trauma.   Upon arrival to the ED she remained unresponsive with sonorous respirations for which she was intubated for airway protection. Extubated in MICU and weaned down to 1.5L prior to transfer to medicine service. Treated with precedex , clonidine  and phenobarbital  taper in the MICU.  Assessment and Plan:  Acute respiratory failure Acute toxic encephalopathy In the setting of recreational drug use.  Extubated and weaned to room air Weaned of precedex  drip Weaned of phenobarbital  taper Taper off clonidine  Cont thiamine ,   Chronic neuropathic pain Cont gabapentin   Acute debilitation Need to be independent of ADLs before discharge to inpatient psych ward  PT/OT following, appreciate  recommendations Psychiatry team following  Suicidal ideations Concern for intentional overdose Committed to IVC now Cont seroquel  at bedtime Started on olanzapine  PRN Psychiatry following  DVT prophylaxis: enoxaparin  (LOVENOX ) injection 40 mg Start: 07/30/24 2200 SCDs Start: 07/30/24 1807  Lovenox    Code Status: Full Code  Disposition Plan: Inpatient psych ward Reason for continuing need for hospitalization: need ongoing PT sessions to get independent with mobility before discharge to inpatient psych ward, IVC patient  Objective: Vitals:   08/07/24 0830 08/07/24 0900 08/07/24 1000 08/07/24 1100  BP:   123/88   Pulse: 70  74   Resp: 16 (!) 21 16 (!) 29  Temp:   98.7 F (37.1 C)   TempSrc:   Oral   SpO2: 97%  97%   Weight:      Height:        Intake/Output Summary (Last 24 hours) at 08/07/2024 1351 Last data filed at 08/07/2024 0636 Gross per 24 hour  Intake 1560 ml  Output 1800 ml  Net -240 ml   Filed Weights   08/05/24 0413 08/06/24 0531 08/07/24 0350  Weight: 83.4 kg 81.5 kg 82.9 kg    Examination:  Physical Exam Vitals and nursing note reviewed.  Constitutional:      General: She is not in acute distress.    Appearance: She is ill-appearing.     Comments: frail  HENT:     Head: Normocephalic and atraumatic.  Cardiovascular:     Rate and Rhythm: Normal rate and regular rhythm.     Pulses: Normal pulses.     Heart sounds: Normal heart sounds.  Pulmonary:     Effort: Pulmonary effort is normal.     Breath sounds: Normal breath sounds.  Abdominal:  General: Bowel sounds are normal.     Palpations: Abdomen is soft.  Neurological:     Mental Status: She is alert.     Data Reviewed: I have personally reviewed following labs and imaging studies  CBC: Recent Labs  Lab 08/03/24 0404 08/04/24 0426 08/05/24 0404 08/06/24 0344 08/07/24 0343  WBC 6.2 6.0 5.6 8.5 6.2  HGB 11.5* 11.5* 11.1* 12.5 11.7*  HCT 36.8 35.9* 35.0* 37.7 34.8*  MCV 95.1  95.0 95.1 92.9 91.8  PLT 170 153 167 177 181   Basic Metabolic Panel: Recent Labs  Lab 08/02/24 0334 08/03/24 0404 08/04/24 0426 08/05/24 0404 08/06/24 0344 08/07/24 0343  NA 145 143 143 142 141 142  K 3.7 3.9 3.7 4.3 4.0 3.8  CL 109 109 110 110 106 106  CO2 26 27 27 26 28 28   GLUCOSE 121* 150* 130* 159* 125* 157*  BUN 15 26* 24* 25* 17 15  CREATININE 0.78 0.69 0.62 0.59 0.52 0.59  CALCIUM  8.8* 8.9 8.8* 8.9 9.1 8.9  MG 2.2 2.2  --  2.0 1.9 2.0  PHOS 3.8 4.4 3.5 3.7 3.1 3.6   GFR: Estimated Creatinine Clearance: 86.2 mL/min (by C-G formula based on SCr of 0.59 mg/dL). Liver Function Tests: Recent Labs  Lab 08/01/24 0350 08/02/24 0334 08/03/24 0404 08/04/24 0426 08/05/24 0404  ALBUMIN 3.3* 3.5 3.4* 3.2* 3.2*   No results for input(s): LIPASE, AMYLASE in the last 168 hours. No results for input(s): AMMONIA in the last 168 hours. Coagulation Profile: No results for input(s): INR, PROTIME in the last 168 hours. Cardiac Enzymes: No results for input(s): CKTOTAL, CKMB, CKMBINDEX, TROPONINI in the last 168 hours. ProBNP, BNP (last 5 results) Recent Labs    08/17/23 0511  BNP 33.4   HbA1C: No results for input(s): HGBA1C in the last 72 hours. CBG: Recent Labs  Lab 08/06/24 1713 08/06/24 2018 08/07/24 0000 08/07/24 0348 08/07/24 0735  GLUCAP 109* 143* 123* 155* 148*   Lipid Profile: No results for input(s): CHOL, HDL, LDLCALC, TRIG, CHOLHDL, LDLDIRECT in the last 72 hours. Thyroid  Function Tests: No results for input(s): TSH, T4TOTAL, FREET4, T3FREE, THYROIDAB in the last 72 hours. Anemia Panel: No results for input(s): VITAMINB12, FOLATE, FERRITIN, TIBC, IRON, RETICCTPCT in the last 72 hours. Sepsis Labs: No results for input(s): PROCALCITON, LATICACIDVEN in the last 168 hours.  Recent Results (from the past 240 hours)  Resp panel by RT-PCR (RSV, Flu A&B, Covid) Urine, Clean Catch     Status: None    Collection Time: 07/30/24  4:34 PM   Specimen: Urine, Clean Catch; Nasal Swab  Result Value Ref Range Status   SARS Coronavirus 2 by RT PCR NEGATIVE NEGATIVE Final    Comment: (NOTE) SARS-CoV-2 target nucleic acids are NOT DETECTED.  The SARS-CoV-2 RNA is generally detectable in upper respiratory specimens during the acute phase of infection. The lowest concentration of SARS-CoV-2 viral copies this assay can detect is 138 copies/mL. A negative result does not preclude SARS-Cov-2 infection and should not be used as the sole basis for treatment or other patient management decisions. A negative result may occur with  improper specimen collection/handling, submission of specimen other than nasopharyngeal swab, presence of viral mutation(s) within the areas targeted by this assay, and inadequate number of viral copies(<138 copies/mL). A negative result must be combined with clinical observations, patient history, and epidemiological information. The expected result is Negative.  Fact Sheet for Patients:  bloggercourse.com  Fact Sheet for Healthcare Providers:  seriousbroker.it  This  test is no t yet approved or cleared by the [place]  FDA and  has been authorized for detection and/or diagnosis of SARS-CoV-2 by FDA under an Emergency Use Authorization (EUA). This EUA will remain  in effect (meaning this test can be used) for the duration of the COVID-19 declaration under Section 564(b)(1) of the Act, 21 U.S.C.section 360bbb-3(b)(1), unless the authorization is terminated  or revoked sooner.       Influenza A by PCR NEGATIVE NEGATIVE Final   Influenza B by PCR NEGATIVE NEGATIVE Final    Comment: (NOTE) The Xpert Xpress SARS-CoV-2/FLU/RSV plus assay is intended as an aid in the diagnosis of influenza from Nasopharyngeal swab specimens and should not be used as a sole basis for treatment. Nasal washings and aspirates are  unacceptable for Xpert Xpress SARS-CoV-2/FLU/RSV testing.  Fact Sheet for Patients: bloggercourse.com  Fact Sheet for Healthcare Providers: seriousbroker.it  This test is not yet approved or cleared by the [place]  FDA and has been authorized for detection and/or diagnosis of SARS-CoV-2 by FDA under an Emergency Use Authorization (EUA). This EUA will remain in effect (meaning this test can be used) for the duration of the COVID-19 declaration under Section 564(b)(1) of the Act, 21 U.S.C. section 360bbb-3(b)(1), unless the authorization is terminated or revoked.     Resp Syncytial Virus by PCR NEGATIVE NEGATIVE Final    Comment: (NOTE) Fact Sheet for Patients: bloggercourse.com  Fact Sheet for Healthcare Providers: seriousbroker.it  This test is not yet approved or cleared by the [place]  FDA and has been authorized for detection and/or diagnosis of SARS-CoV-2 by FDA under an Emergency Use Authorization (EUA). This EUA will remain in effect (meaning this test can be used) for the duration of the COVID-19 declaration under Section 564(b)(1) of the Act, 21 U.S.C. section 360bbb-3(b)(1), unless the authorization is terminated or revoked.  Performed at [facility], [address] [place], [place] [address]   Blood culture (single)     Status: None   Collection Time: 07/30/24  4:34 PM   Specimen: BLOOD  Result Value Ref Range Status   Specimen Description BLOOD BLOOD RIGHT WRIST  Final   Special Requests   Final    BOTTLES DRAWN AEROBIC AND ANAEROBIC Blood Culture adequate volume   Culture   Final    NO GROWTH 5 DAYS Performed at [facility], [address], [place], [place] [address]    Report Status 08/04/2024 FINAL  Final  Urine Culture     Status: None   Collection Time: 07/30/24  4:34 PM   Specimen: Urine, Random  Result Value Ref Range  Status   Specimen Description   Final    URINE, RANDOM Performed at [facility], [address], [place], [place] [address]    Special Requests   Final    NONE Reflexed from [id number] Performed at [facility], [address] [place], [place] [address]    Culture   Final    NO GROWTH Performed at [facility], [address], [place], [place] [address]    Report Status 07/31/2024 FINAL  Final  MRSA Next Gen by PCR, Nasal     Status: None   Collection Time: 07/30/24  6:54 PM   Specimen: Nasal Mucosa; Nasal Swab  Result Value Ref Range Status   MRSA by PCR Next Gen NOT DETECTED NOT DETECTED Final    Comment: (NOTE) The GeneXpert MRSA Assay (FDA approved for NASAL specimens only), is one  component of a comprehensive MRSA colonization surveillance program. It is not intended to diagnose MRSA infection nor to guide or monitor treatment for MRSA infections. Test performance is not FDA approved in patients less than 14 years old. Performed at [facility], [address], [place], [place] [address]      Radiology Studies: No results found.  Scheduled Meds:  Chlorhexidine  Gluconate Cloth  6 each Topical Daily   cloNIDine   0.1 mg Oral BID   Followed by   NOREEN ON 08/09/2024] cloNIDine   0.1 mg Oral Daily   enoxaparin  (LOVENOX ) injection  40 mg Subcutaneous QHS   gabapentin   600 mg Oral TID   nicotine   21 mg Transdermal Daily   nutrition supplement (JUVEN)  1 packet Per Tube BID BM   mouth rinse  15 mL Mouth Rinse 4 times per day   QUEtiapine   400 mg Oral QHS   thiamine  (VITAMIN B1) injection  100 mg Intravenous Daily   Or   thiamine   100 mg Oral Daily   Continuous Infusions:   LOS: 8 days   Norval Bar, MD  [facility]  08/07/2024, 1:51 PM   "

## 2024-08-07 NOTE — Plan of Care (Signed)
" °  Problem: Activity: Goal: Ability to tolerate increased activity will improve Outcome: Progressing   Problem: Education: Goal: Knowledge of General Education information will improve Description: Including pain rating scale, medication(s)/side effects and non-pharmacologic comfort measures Outcome: Progressing   Problem: Health Behavior/Discharge Planning: Goal: Ability to manage health-related needs will improve Outcome: Progressing   Problem: Respiratory: Goal: Ability to maintain a clear airway and adequate ventilation will improve Outcome: Progressing   "

## 2024-08-07 NOTE — Plan of Care (Signed)
  Problem: Activity: Goal: Ability to tolerate increased activity will improve Outcome: Progressing   Problem: Respiratory: Goal: Ability to maintain a clear airway and adequate ventilation will improve Outcome: Progressing   Problem: Role Relationship: Goal: Method of communication will improve Outcome: Progressing   Problem: Education: Goal: Knowledge of General Education information will improve Description: Including pain rating scale, medication(s)/side effects and non-pharmacologic comfort measures Outcome: Progressing   Problem: Health Behavior/Discharge Planning: Goal: Ability to manage health-related needs will improve Outcome: Progressing

## 2024-08-07 NOTE — Progress Notes (Signed)
 Alerted by therapy staff that patient was exiting unit to go out and smoke. Patient returned to room independently. Patient introduced to role of nurse navigator.  Patient expresses her biggest concern right now as being I need my meds back. Upon further inquiry, patient states she was previously on effexor  150mg  once daily and felt it was the only medication at works. Patient stated it gave [her] at least some will to live. Patient previously lived with her mother. Patient endorses some housing security presently, as she states she does not get along with her mother's significant other and he lies on [her] all the time. Patient does receive SSDI and has for about 15 years now.  She endorses being married twice in the past and having three biological children. Her oldest child is deceased following a MVC. She goes on to talk about how difficult it was to lose custody of her children as young mother, etc.  Patient endorses having spent time incarcerated and in other institutionalized settings.  Patient states I don't know why I keep trying to kill myself, it never works. You'd think I'd just give up. Emotional support provided. Patient denies any active SI or HI, but does endorse a constant feeling of I just don't really care one way or another. Patient states she would like to get support from [facility] Will send referral.

## 2024-08-07 NOTE — Discharge Instructions (Signed)
 Here are shelter and housing-related resources in [place] and [place] (including [place] area) that can help if youre experiencing homelessness or housing instability:  ?? [employer] Housing Resources ?? Emergency Shelter   [facility] [place] - Operates a doctor, hospital for people experiencing homelessness (men, women, and families), including shelter, meals, and case management to support finding longer-term housing.  ?? Support & Related Services   [facility] in [place] -- Offers social services and support for women and families (referrals, assistance).  [facility] -- [employer] providing community services (may include support for basic needs).  ?? You can also call [place] 211 by dialing 2-1-1 for free referrals to housing help and shelter resources throughout [facility].  ?? [employer] Careers Information Officer) Shelter & Housing Resources ?? Coordinated Entry (First Step)  [employer]: 845-609-4341 -- Call this to access the local homeless assistance network, get assessed for shelter placement, and learn about housing options.  ??? Emergency & Transitional Shelters   [facility] -- [employer], shelter referrals, and support services.  [employer] -- [employer] and supportive services; may assist with shelter/housing referrals.  [facility] [employer] [facility] [employer] [facility] -- Shelter and family support in [place].  [facility]  -- Offers shelter support for women and families.  [facility] -- Shelter option in [place].  [facility] & [facility] -- Additional local shelter listings (availability varies).  ?? Supportive Services (may help avoid homelessness)   [facility] -- Provides services connected to housing stability.  The [facility] -- Assists  with basic needs and referrals.  [facility] [employer] -- Resource hub connecting people to services, housing help, and support.  [facility] -- A partnership focused on chronic homelessness and housing solutions.  [employer] -- Works on housing access and support.  ?? You can also dial 2-1-1 ([place] [employer]) to be connected with local housing and shelter support across [facility].  ?? Tips for Accessing Shelter Help  Call the coordinated entry line (310)754-6664) in [facility] before showing up -- they help match people with available shelter beds.  Dial [place] 211 (2-1-1) for referrals anywhere in [place] -- they can connect you with immediate housing help and shelter openings.  Visit shelters earlier in the day and ask about intake procedures, eligibility, and available space.

## 2024-08-07 NOTE — TOC Progression Note (Addendum)
 Transition of Care [facility]) - Progression Note    Patient Details  Name: Andrea Sanford MRN: [id number] Date of Birth: 1970-11-12  [facility]) CM/SW Contact  Corrie JINNY Ruts, LCSW Phone Number: 08/07/2024, 12:08 PM  Clinical Narrative:    Chart reviewed. Nurse take asked SW if I could see patient. SW was able to speak with the patient at bedside today. The patient reports that she would still like to go into SU treatment facility. SW also addressed recommendation of SNF. The patient was accepting but still reports needing both. The patient reports that she does not want to go into [facility] or into a facility  in [place] due to them taking me off my anti-depressants cold turkey. Patient was not able to remember the facility in [place] that she was referring to.   SW has reached out to Annabella Essex to assist the patient with treatment facilities.      Barriers to Discharge: Continued Medical Work up               Expected Discharge Plan and Services         Expected Discharge Date: 08/06/24                                     Social Drivers of Health (SDOH) Interventions SDOH Screenings   Food Insecurity: Patient Unable To Answer (07/30/2024)  Housing: Patient Unable To Answer (07/30/2024)  Transportation Needs: Patient Unable To Answer (07/30/2024)  Utilities: Patient Unable To Answer (07/30/2024)  Alcohol Screen: Low Risk (06/13/2023)  Depression (PHQ2-9): High Risk (05/21/2024)  Tobacco Use: High Risk (06/28/2024)    Readmission Risk Interventions    08/06/2024   12:44 PM  Readmission Risk Prevention Plan  Transportation Screening Complete  Medication Review (RN Care Manager) Complete  PCP or Specialist appointment within 3-5 days of discharge Complete  HRI or Home Care Consult Complete  SW Recovery Care/Counseling Consult Complete  Palliative Care Screening Not Applicable  Skilled Nursing Facility Not Applicable

## 2024-08-07 NOTE — Progress Notes (Signed)
 Physical Therapy Treatment Patient Details Name: Andrea Sanford MRN: [id number] DOB: [birth year] Today's Date: 08/07/2024   History of Present Illness presented to ER after being found unresponsive after drug overdose; admitted for management of severe hypoxic, hypercapnic respiratory failure and acute metabolic encephalopathy secondary to severe cocaine toxicity (requiring mechanical intubation 1/6-1/12/25)       PT Comments  Pt was in recliner upon arrival. She is A and O x 4. Impulsive and requesting to go home to smoke. She was easily able to stand and tolerate ambulation around unit 3 x with RW. Vcs + CGA at times for safety due to impulsivity. Pt has great strength but poor awareness of safety concerns. Mostly requires supervision but occasional CGA for additional safety. Acute PT will continue to follow per current POC.    If plan is discharge home, recommend the following: A lot of help with bathing/dressing/bathroom;A lot of help with walking and/or transfers     Equipment Recommendations  Rolling walker (2 wheels)       Precautions / Restrictions Precautions Precautions: Fall Recall of Precautions/Restrictions: Impaired Restrictions Weight Bearing Restrictions Per Provider Order: No     Mobility  Bed Mobility Overal bed mobility: Needs Assistance Bed Mobility: Supine to Sit, Sit to Supine  Supine to sit: Supervision Sit to supine: Supervision     Transfers Overall transfer level: Needs assistance Equipment used: Rolling walker (2 wheels) Transfers: Sit to/from Stand Sit to Stand: Contact guard assist  General transfer comment: CGA at times for safety due to pt's impulsivity    Ambulation/Gait Ambulation/Gait assistance: Contact guard assist Gait Distance (Feet): 600 Feet Assistive device: Rolling walker (2 wheels) Gait Pattern/deviations: Step-through pattern  General Gait Details: Pt ambulated 600 ft with RW. gait cadence was inconsistent. Pt impulsively  lets go of RW at times.    Balance Overall balance assessment: Needs assistance Sitting-balance support: No upper extremity supported, Feet supported Sitting balance-Leahy Scale: Good     Standing balance support: Bilateral upper extremity supported, During functional activity, Reliant on assistive device for balance Standing balance-Leahy Scale: Fair Standing balance comment: Fall risk due to impulsivity      Communication Communication Communication: No apparent difficulties  Cognition Arousal: Alert Behavior During Therapy: Impulsive   PT - Cognitive impairments: No family/caregiver present to determine baseline    Following commands: Intact Following commands impaired: Follows one step commands inconsistently, Follows one step commands with increased time    Cueing Cueing Techniques: Verbal cues, Tactile cues         Pertinent Vitals/Pain Pain Assessment Pain Assessment: No/denies pain Pain Location: L knee Pain Descriptors / Indicators: Aching, Grimacing, Guarding Pain Intervention(s): Limited activity within patient's tolerance, Monitored during session, Premedicated before session, Repositioned     PT Goals (current goals can now be found in the care plan section) Acute Rehab PT Goals Patient Stated Goal: to get out of here Progress towards PT goals: Progressing toward goals    Frequency    Min 2X/week       Co-evaluation     PT goals addressed during session: Mobility/safety with mobility;Balance;Proper use of DME;Strengthening/ROM        AM-PAC PT 6 Clicks Mobility   Outcome Measure  Help needed turning from your back to your side while in a flat bed without using bedrails?: A Little Help needed moving from lying on your back to sitting on the side of a flat bed without using bedrails?: A Little Help needed moving to and from a  bed to a chair (including a wheelchair)?: A Little Help needed standing up from a chair using your arms (e.g.,  wheelchair or bedside chair)?: A Little Help needed to walk in hospital room?: A Little Help needed climbing 3-5 steps with a railing? : A Little 6 Click Score: 18    End of Session Equipment Utilized During Treatment: Gait belt Activity Tolerance: Patient tolerated treatment well Patient left: with call bell/phone within reach;in bed;with bed alarm set;with nursing/sitter in room Nurse Communication: Mobility status PT Visit Diagnosis: Muscle weakness (generalized) (M62.81);Difficulty in walking, not elsewhere classified (R26.2)     Time: 8944-8885 PT Time Calculation (min) (ACUTE ONLY): 19 min  Charges:    $Gait Training: 8-22 mins PT General Charges $$ ACUTE PT VISIT: 1 Visit                    Rankin Essex PTA 08/07/2024, 1:08 PM

## 2024-08-07 NOTE — Progress Notes (Signed)
 Occupational Therapy Treatment Patient Details Name: Andrea Sanford MRN: [id number] DOB: March 13, 1971 Today's Date: 08/07/2024   History of present illness presented to ER after being found unresponsive after drug overdose; admitted for management of severe hypoxic, hypercapnic respiratory failure and acute metabolic encephalopathy secondary to severe cocaine toxicity (requiring mechanical intubation 1/6-1/12/25)   OT comments  Chart reviewed to date, pt greeted semi supine in bed, agreeable to OT tx session targeting improving functional activity tolerance in prep for ADL tasks. She is alert and oriented x4, continued impulsivity and decreased insight into deficits, but appears improved from evaluation. Pt is making progress towards goals, see further details below, but continues to perform ADL/functional mobility below PLOF. OT will continue to follow to facilitate optimal ADL/functional mobility engagement. Pt is left in bed, safety maintained, all needs met. Vss.       If plan is discharge home, recommend the following:  A lot of help with walking and/or transfers;A lot of help with bathing/dressing/bathroom;Supervision due to cognitive status   Equipment Recommendations  BSC/3in1 (2WW)    Recommendations for Other Services      Precautions / Restrictions Precautions Precautions: Fall Recall of Precautions/Restrictions: Impaired Restrictions Weight Bearing Restrictions Per Provider Order: No       Mobility Bed Mobility Overal bed mobility: Needs Assistance Bed Mobility: Supine to Sit, Sit to Supine     Supine to sit: Supervision Sit to supine: Supervision        Transfers Overall transfer level: Needs assistance Equipment used: Rolling walker (2 wheels) Transfers: Sit to/from Stand Sit to Stand: Contact guard assist                 Balance Overall balance assessment: Needs assistance Sitting-balance support: No upper extremity supported, Feet  supported Sitting balance-Leahy Scale: Good     Standing balance support: Bilateral upper extremity supported, During functional activity, Reliant on assistive device for balance Standing balance-Leahy Scale: Fair                             ADL either performed or assessed with clinical judgement   ADL Overall ADL's : Needs assistance/impaired     Grooming: Contact guard assist;Standing;Minimal assistance Grooming Details (indicate cue type and reason): with RW at sink level, intermittent MIN A for dynamic standing tasks     Lower Body Bathing: Contact guard assist;Sitting/lateral leans       Lower Body Dressing: Supervision/safety;Sitting/lateral leans   Toilet Transfer: Contact guard assist;Minimal assistance;Rolling walker (2 wheels);Ambulation;Cueing for sequencing;Cueing for safety Toilet Transfer Details (indicate cue type and reason): simulated, intermittent vcs for safety/technique Toileting- Clothing Manipulation and Hygiene: Contact guard assist;Sit to/from stand       Functional mobility during ADLs: Contact guard assist;Minimal assistance;Rolling walker (2 wheels);Cueing for safety;Cueing for sequencing (approx 150' with RW, intermittent MIN A)      Extremity/Trunk Assessment              Vision       Perception     Praxis     Communication Communication Communication: Impaired Factors Affecting Communication: Reduced clarity of speech (garbled at times)   Cognition Arousal: Alert Behavior During Therapy: Impulsive Cognition: Cognition impaired     Awareness: Online awareness impaired Memory impairment (select all impairments): Working memory Attention impairment (select first level of impairment): Selective attention Executive functioning impairment (select all impairments): Organization, Sequencing, Reasoning, Problem solving OT - Cognition Comments: will continue to assess  Following commands:  Impaired Following commands impaired: Follows one step commands inconsistently, Follows one step commands with increased time (mutli modal cues)      Cueing   Cueing Techniques: Gestural cues, Tactile cues, Verbal cues  Exercises Other Exercises Other Exercises: edu re role of OT, role of rehab    Shoulder Instructions       General Comments vss, no dizziness/SOB; previous trach site with intermittent wheezing    Pertinent Vitals/ Pain       Pain Assessment Pain Assessment: No/denies pain  Home Living                                          Prior Functioning/Environment              Frequency  Min 2X/week        Progress Toward Goals  OT Goals(current goals can now be found in the care plan section)  Progress towards OT goals: Progressing toward goals  Acute Rehab OT Goals Time For Goal Achievement: 08/20/24  Plan      Co-evaluation                 AM-PAC OT 6 Clicks Daily Activity     Outcome Measure   Help from another person eating meals?: None Help from another person taking care of personal grooming?: A Little Help from another person toileting, which includes using toliet, bedpan, or urinal?: A Little Help from another person bathing (including washing, rinsing, drying)?: A Little Help from another person to put on and taking off regular upper body clothing?: A Little Help from another person to put on and taking off regular lower body clothing?: A Little 6 Click Score: 19    End of Session Equipment Utilized During Treatment: Rolling walker (2 wheels)  OT Visit Diagnosis: Other abnormalities of gait and mobility (R26.89);Other symptoms and signs involving cognitive function   Activity Tolerance Patient tolerated treatment well   Patient Left in bed;with call bell/phone within reach;with bed alarm set   Nurse Communication Mobility status        Time: 9170-9147 OT Time Calculation (min): 23 min  Charges: OT  General Charges $OT Visit: 1 Visit OT Treatments $Self Care/Home Management : 8-22 mins $Therapeutic Activity: 8-22 mins Therisa Sheffield, OTD OTR/L  08/07/2024, 9:01 AM

## 2024-08-07 NOTE — Progress Notes (Signed)
 The patient has been discharged to 1C room 127. Report has been given to Joyce, [place]

## 2024-08-07 NOTE — Progress Notes (Signed)
 Alerted to commotion in hallway. Patient with staff and security. Patient had attempted to exit facility to smoke. Patient initially agitated, but easily re-directable with communication from nurse navigator. Patient reminded of her own goal--to receive mental health support and medication adjustments. Patient reminded psych provider would be visiting her today to discuss such matters.  Patient expressed frustration regarding the inability to smoke; however agreed that her mental health was priority. Emotional support and active listening provided. Patient given cranberry juice, per request, and assisted to order lunch tray.  Patient states her mother should be here after her 2pm appointment. Information relayed to care team.

## 2024-08-07 NOTE — Plan of Care (Signed)
" °  Problem: Activity: Goal: Ability to tolerate increased activity will improve Outcome: Progressing   Problem: Respiratory: Goal: Ability to maintain a clear airway and adequate ventilation will improve Outcome: Progressing   Problem: Activity: Goal: Risk for activity intolerance will decrease Outcome: Not Progressing   "

## 2024-08-07 NOTE — BH Assessment (Signed)
 PT  PLACED UNDER IVC PAPERS  PER  ANNIE  SMITH  NP  INFORMED  JOYCE  PASS  LPN

## 2024-08-07 NOTE — Consult Note (Signed)
 [facility] Psychiatric Consult Initial  Patient Name: .Andrea Sanford  MRN: [id number]  DOB: [birth year]  Consult Order details:  Orders (From admission, onward)     Start     Ordered   07/30/24 1636  IP CONSULT TO PSYCHIATRY       Comments: Possible overdose, intent unknown  Ordering Provider: Dicky Anes, MD  Provider:  (Not yet assigned)  Question:  Reason for consult:  Answer:  Medication management   07/30/24 1636             Mode of Visit: In person    Psychiatry Consult Evaluation  Service Date: August 07, 2024 LOS:  LOS: 8 days  Chief Complaint I took a bunch of muscle relaxers  Primary Psychiatric Diagnoses  Cocaine abuse with intoxication (HCC) Intentional overdose   Assessment   Patient is recommended for inpatient psychiatric admission based on recent serious suicide attempt via intentional overdose on muscle relaxers with clear suicidal intent (I hate myself and did not have any reason to go on), ongoing passive suicidal ideation and profound hopelessness as evidenced by statement I just do not care, untreated major depressive disorder for approximately 6 months since psychiatrist quit with no established psychiatric follow-up, likely worsening of depression following reported abrupt discontinuation of Effexor  in September 2025, active substance use disorder with crack cocaine, THC, and nicotine  use complicating psychiatric presentation, history of alcohol use disorder currently in remission approximately one year, and lack of outpatient psychiatric support or medication management to maintain safety in community.  Patient is currently medically hospitalized.  Psychiatry will continue to round to determine appropriateness for inpatient psychiatric level of care while patient medically hospitalized.  08/07/2024: Patient was seen on rounds today by psychiatry. Patient was demanding to leave and became irate and irritable with this provider, insisting she  needed to leave to smoke a cigarette on the edge of the property. Previous recommendation was for inpatient psychiatric admission after medical clearance due to patient's intentional baclofen  overdose. Patient had previously been agreeable to this plan, but today continued to raise her voice, got in this provider's face, and stated she was leaving. Due to concerns regarding recent intentional overdose, lack of appropriate mental health care established in the community, and patient's escalating agitation with demand to leave against medical advice before psychiatric treatment could be provided, involuntary commitment was initiated at this time. Psychiatric examination today was extremely limited due to patient's significant irritability and refusal to engage appropriately with this provider.    Diagnoses:  Active Hospital problems: Principal Problem:   Cocaine abuse with intoxication (HCC)    Plan   ## Psychiatric Medication Recommendations:  EKG ordered awaiting results from new EKG to make appropriate recommendations-Will consider reinitiating patient's previously prescribed Effexor  as patient reported she responded well to this in the past  ## Medical Decision Making Capacity: Not specifically addressed in this encounter  ## Further Work-up:   -- most recent EKG on 07/30/2024 had QtC of 473 -- Pertinent labwork reviewed earlier this admission includes: Magnesium  CBC, BMP   ## Disposition:--Patient is recommended for inpatient psychiatric admission pending medical stabilization.  Psychiatry will continue to round on patient daily as patient is currently medically hospitalized.  If patient attempts to leave the facility however, recommended to IVC patient due to intentional overdose attempt.  ## Behavioral / Environmental: - No specific recommendations at this time.     ## Safety and Observation Level:  - Based on my clinical evaluation, I estimate the  patient to be at moderate risk of  self harm in the current setting. - At this time, we recommend  1:1 Observation. This decision is based on my review of the chart including patient's history and current presentation, interview of the patient, mental status examination, and consideration of suicide risk including evaluating suicidal ideation, plan, intent, suicidal or self-harm behaviors, risk factors, and protective factors. This judgment is based on our ability to directly address suicide risk, implement suicide prevention strategies, and develop a safety plan while the patient is in the clinical setting. Please contact our team if there is a concern that risk level has changed.  CSSR Risk Category:   Suicide Risk Assessment: Patient has following modifiable risk factors for suicide: under treated depression , current symptoms: anxiety/panic, insomnia, impulsivity, anhedonia, hopelessness, and triggering events, which we are addressing by recommending inpatient psychiatric admission pending medical stabilization. Patient has following non-modifiable or demographic risk factors for suicide: N/A Patient has the following protective factors against suicide: Supportive family  Thank you for this consult request. Recommendations have been communicated to the primary team.  We will continue to round and assess patient's appropriateness for inpatient psychiatric admission at this time.   Andrea Sharps, NP        History of Present Illness  Relevant Aspects of [facility]   Patient Report:   Medical team noted patient was alert and oriented post-extubation, allowing for psychiatric evaluation. Patient reported to the psychiatry team that she took a bunch of her muscle relaxer in an intentional suicide attempt. She stated I hate myself and I did not have any reason to go on. Patient reported she feels she has struggled with depression all of her life.  Patient reported she was previously aligned with a psychiatrist, however,  they quit approximately 6 months ago and she has not been able to follow up with anyone for psychiatric care since that time. Patient reported previous inpatient detox stays in [place]. Patient expressed significant dissatisfaction that she was stopped off her antidepressant medication Effexor  cold turkey in September 2025. Patient denied any intentional overdose on Effexor  and stated this medication worked the best for her depression. She was unsure why this medication was discontinued.  When asked about current suicidal ideation, patient stated I just do not care, which represents passive suicidal ideation and ongoing hopelessness. Patient denied current homicidal ideation. She denied auditory or visual hallucinations. Patient reported previous alcohol use and stated I used to be a bad drunk but reported she has not consumed alcohol in approximately one year. Patient did endorse current crack cocaine use as well as THC and nicotine  use. Patient reported she has a court date today for a legal charge and stated she is currently trying to call the court to inform them of her current health status and inability to make this court date. Patient denied current access to firearms. Patient provided permission for the psychiatry team to contact her mother Roderick at phone number [phone].     Psychiatric and Social History  Psychiatric History:  Information collected from patient/chart review  Prev Dx/Sx: Polysubstance abuse, mentioned history and chart review of bipolar disorder Current Psych Provider: None currently, patient reported their psychiatrist retired about 6 months ago Home Meds (current): Patient denied current medications Previous Med Trials: Effexor  which patient reported worked well for her but reported a previous provider quit prescribing cold turkey reported the last time she took this medication she believes was in September 2025 Therapy: None currently  Prior Psych  Hospitalization: Yes Prior Violence: Denied  Family Psych History: Unsure Family Hx suicide: Unsure  Social History:   Legal Hx: Denied Living Situation: With mother  Access to weapons/lethal means: Denied access to firearms  Substance History Alcohol: Endorsed, but reported has been sober for about a year Tobacco: Endorsed Illicit drugs: Crack use at least weekly as well as THC Prescription drug abuse: Denied Rehab hx: Endorsed  Exam Findings  Physical Exam: Reviewed and agree with the physical exam findings conducted by the medical provider Vital Signs:  Temp:  [98.1 F (36.7 C)-99 F (37.2 C)] 98.1 F (36.7 C) (01/14 0342) Pulse Rate:  [63-84] 70 (01/14 0830) Resp:  [9-25] 21 (01/14 0900) BP: (90-140)/(54-97) 131/76 (01/14 0828) SpO2:  [91 %-100 %] 97 % (01/14 0830) Weight:  [82.9 kg] 82.9 kg (01/14 0350) Blood pressure 131/76, pulse 70, temperature 98.1 F (36.7 C), temperature source Oral, resp. rate (!) 21, height 5' 6 (1.676 m), weight 82.9 kg, SpO2 97%. Body mass index is 29.5 kg/m.        Other History   These have been pulled in through the EMR, reviewed, and updated if appropriate.  Family History:  The patient's family history includes Cancer in her father; Depression in her mother; Diabetes in her mother.  Medical History: Past Medical History:  Diagnosis Date   Allergy    Anxiety    Arthritis    Asthma    Bipolar 1 disorder (HCC)    Cancer (HCC)    COPD (chronic obstructive pulmonary disease) (HCC)    Depression    Diabetes mellitus without complication (HCC)    Drug abuse (HCC)    Hypertension    Neuromuscular disorder (HCC)    Osteoporosis    PTSD (post-traumatic stress disorder) 04/07/2024   Sleep apnea     Surgical History: Past Surgical History:  Procedure Laterality Date   ANKLE SURGERY Right    IR GASTROSTOMY TUBE MOD SED  09/04/2023   IR GASTROSTOMY TUBE REMOVAL  12/13/2023   IR PATIENT EVAL TECH 0-60 MINS  11/03/2023    PERIPHERAL VASCULAR THROMBECTOMY Left 1991   TRACHEOSTOMY TUBE PLACEMENT N/A 09/01/2023   Procedure: TRACHEOSTOMY;  Surgeon: Blair Mt, MD;  Location: [facility] ORS;  Service: ENT;  Laterality: N/A;     Medications:  Current Medications[1]  Allergies: Allergies[2]  Andrea Sharps, NP This note was created using Dragon dictation software. Please excuse any inadvertent transcription errors. Case was discussed with supervising physician Dr. Jadapalle who is agreeable with current plan.      [1]  Current Facility-Administered Medications:    Chlorhexidine  Gluconate Cloth 2 % PADS 6 each, 6 each, Topical, Daily, Keene, Jeremiah D, NP, 6 each at 08/07/24 9171   cloNIDine  (CATAPRES ) tablet 0.1 mg, 0.1 mg, Oral, BID **FOLLOWED BY** [START ON 08/09/2024] cloNIDine  (CATAPRES ) tablet 0.1 mg, 0.1 mg, Oral, Daily, Tariq, Hassan, MD   enoxaparin  (LOVENOX ) injection 40 mg, 40 mg, Subcutaneous, QHS, Keene, Jeremiah D, NP, 40 mg at 08/06/24 2100   gabapentin  (NEURONTIN ) capsule 600 mg, 600 mg, Oral, TID, Kasa, Kurian, MD, 600 mg at 08/07/24 9171   hydrALAZINE  (APRESOLINE ) injection 10-20 mg, 10-20 mg, Intravenous, Q6H PRN, Rust-Chester, Jenita L, NP   ipratropium-albuterol  (DUONEB) 0.5-2.5 (3) MG/3ML nebulizer solution 3 mL, 3 mL, Nebulization, Q4H PRN, Keene, Jeremiah D, NP   nicotine  (NICODERM CQ  - dosed in mg/24 hours) patch 21 mg, 21 mg, Transdermal, Daily, Nelson, Dana G, NP, 21 mg at 08/07/24 9170   nutrition  supplement (JUVEN) (JUVEN) powder packet 1 packet, 1 packet, Per Tube, BID BM, Kasa, Kurian, MD, 1 packet at 08/05/24 9178   Oral care mouth rinse, 15 mL, Mouth Rinse, PRN, Kasa, Kurian, MD   Oral care mouth rinse, 15 mL, Mouth Rinse, 4 times per day, Isaiah Scrivener, MD, 15 mL at 08/07/24 0829   Oral care mouth rinse, 15 mL, Mouth Rinse, PRN, Kasa, Kurian, MD   oxyCODONE  (Oxy IR/ROXICODONE ) immediate release tablet 5 mg, 5 mg, Oral, Q6H PRN, Kasa, Kurian, MD, 5 mg at 08/07/24 0413   polyethylene  glycol (MIRALAX  / GLYCOLAX ) packet 17 g, 17 g, Oral, Daily PRN, Kasa, Kurian, MD   QUEtiapine  (SEROQUEL ) tablet 400 mg, 400 mg, Oral, QHS, Kasa, Kurian, MD, 400 mg at 08/06/24 2106   senna (SENOKOT) tablet 8.6 mg, 1 tablet, Oral, BID PRN, Kasa, Kurian, MD   thiamine  (VITAMIN B1) injection 100 mg, 100 mg, Intravenous, Daily, 100 mg at 08/05/24 2122 **OR** thiamine  (VITAMIN B1) tablet 100 mg, 100 mg, Oral, Daily, Rust-Chester, Britton L, NP, 100 mg at 08/07/24 0828 [2] No Known Allergies

## 2024-08-08 ENCOUNTER — Inpatient Hospital Stay
Admission: AD | Admit: 2024-08-08 | Discharge: 2024-08-13 | DRG: 885 | Disposition: A | Discharge status: Home / Self Care | Payer: MEDICAID | Source: Intra-hospital | Attending: Psychiatry | Admitting: Psychiatry

## 2024-08-08 ENCOUNTER — Encounter: Payer: Self-pay | Admitting: Psychiatry

## 2024-08-08 ENCOUNTER — Other Ambulatory Visit: Payer: Self-pay

## 2024-08-08 ENCOUNTER — Telehealth (HOSPITAL_BASED_OUTPATIENT_CLINIC_OR_DEPARTMENT_OTHER): Payer: Self-pay | Admitting: *Deleted

## 2024-08-08 DIAGNOSIS — F17210 Nicotine dependence, cigarettes, uncomplicated: Secondary | ICD-10-CM | POA: Diagnosis present

## 2024-08-08 DIAGNOSIS — Z5941 Food insecurity: Secondary | ICD-10-CM

## 2024-08-08 DIAGNOSIS — F4310 Post-traumatic stress disorder, unspecified: Secondary | ICD-10-CM | POA: Diagnosis present

## 2024-08-08 DIAGNOSIS — Z818 Family history of other mental and behavioral disorders: Secondary | ICD-10-CM

## 2024-08-08 DIAGNOSIS — Z833 Family history of diabetes mellitus: Secondary | ICD-10-CM | POA: Diagnosis not present

## 2024-08-08 DIAGNOSIS — F332 Major depressive disorder, recurrent severe without psychotic features: Principal | ICD-10-CM | POA: Diagnosis present

## 2024-08-08 DIAGNOSIS — Z5982 Transportation insecurity: Secondary | ICD-10-CM

## 2024-08-08 DIAGNOSIS — J4489 Other specified chronic obstructive pulmonary disease: Secondary | ICD-10-CM | POA: Diagnosis present

## 2024-08-08 DIAGNOSIS — I10 Essential (primary) hypertension: Secondary | ICD-10-CM | POA: Diagnosis present

## 2024-08-08 DIAGNOSIS — Z79899 Other long term (current) drug therapy: Secondary | ICD-10-CM | POA: Diagnosis not present

## 2024-08-08 DIAGNOSIS — E119 Type 2 diabetes mellitus without complications: Secondary | ICD-10-CM | POA: Diagnosis present

## 2024-08-08 DIAGNOSIS — Z59868 Other specified financial insecurity: Secondary | ICD-10-CM | POA: Diagnosis not present

## 2024-08-08 DIAGNOSIS — F419 Anxiety disorder, unspecified: Secondary | ICD-10-CM | POA: Diagnosis present

## 2024-08-08 DIAGNOSIS — M810 Age-related osteoporosis without current pathological fracture: Secondary | ICD-10-CM | POA: Diagnosis present

## 2024-08-08 DIAGNOSIS — Z5948 Other specified lack of adequate food: Secondary | ICD-10-CM | POA: Diagnosis not present

## 2024-08-08 DIAGNOSIS — F14129 Cocaine abuse with intoxication, unspecified: Secondary | ICD-10-CM | POA: Diagnosis not present

## 2024-08-08 DIAGNOSIS — Z9152 Personal history of nonsuicidal self-harm: Secondary | ICD-10-CM

## 2024-08-08 DIAGNOSIS — Z5900 Homelessness unspecified: Secondary | ICD-10-CM

## 2024-08-08 DIAGNOSIS — Z791 Long term (current) use of non-steroidal anti-inflammatories (NSAID): Secondary | ICD-10-CM | POA: Diagnosis not present

## 2024-08-08 DIAGNOSIS — M1712 Unilateral primary osteoarthritis, left knee: Secondary | ICD-10-CM

## 2024-08-08 DIAGNOSIS — R45851 Suicidal ideations: Secondary | ICD-10-CM | POA: Diagnosis present

## 2024-08-08 LAB — GLUCOSE, CAPILLARY
Glucose-Capillary: 108 mg/dL — ABNORMAL HIGH (ref 70–99)
Glucose-Capillary: 128 mg/dL — ABNORMAL HIGH (ref 70–99)
Glucose-Capillary: 114 mg/dL — ABNORMAL HIGH (ref 70–99)

## 2024-08-08 MED ORDER — CLONIDINE HCL 0.1 MG PO TABS
0.1000 mg | ORAL_TABLET | Freq: Every day | ORAL | Status: AC
  Administered 2024-08-09: 0.1 mg via ORAL
  Filled 2024-08-08: qty 1

## 2024-08-08 MED ORDER — ACETAMINOPHEN 325 MG PO TABS
650.0000 mg | ORAL_TABLET | Freq: Four times a day (QID) | ORAL | Status: DC | PRN
  Administered 2024-08-08 – 2024-08-13 (×7): 650 mg via ORAL
  Filled 2024-08-08 (×6): qty 2

## 2024-08-08 MED ORDER — HYDROXYZINE HCL 25 MG PO TABS
25.0000 mg | ORAL_TABLET | Freq: Three times a day (TID) | ORAL | Status: DC | PRN
  Administered 2024-08-09 – 2024-08-12 (×3): 25 mg via ORAL
  Filled 2024-08-08 (×3): qty 1

## 2024-08-08 MED ORDER — DIPHENHYDRAMINE HCL 25 MG PO CAPS: 25.0000 mg | ORAL_CAPSULE | Freq: Four times a day (QID) | ORAL | Status: DC | PRN

## 2024-08-08 MED ORDER — HALOPERIDOL 5 MG PO TABS: 5.0000 mg | ORAL_TABLET | Freq: Three times a day (TID) | ORAL | Status: DC | PRN

## 2024-08-08 MED ORDER — LORAZEPAM 2 MG/ML IJ SOLN: 2.0000 mg | Freq: Three times a day (TID) | INTRAMUSCULAR | Status: DC | PRN

## 2024-08-08 MED ORDER — HYDRALAZINE HCL 20 MG/ML IJ SOLN: 10.0000 mg | Freq: Four times a day (QID) | INTRAMUSCULAR | Status: DC | PRN

## 2024-08-08 MED ORDER — VENLAFAXINE HCL ER 37.5 MG PO CP24: 37.5000 mg | ORAL_CAPSULE | Freq: Every day | ORAL | 0 refills | Status: DC

## 2024-08-08 MED ORDER — NICOTINE 21 MG/24HR TD PT24
21.0000 mg | MEDICATED_PATCH | Freq: Every day | TRANSDERMAL | Status: DC
  Administered 2024-08-09: 21 mg via TRANSDERMAL
  Filled 2024-08-08 (×4): qty 1

## 2024-08-08 MED ORDER — QUETIAPINE FUMARATE 200 MG PO TABS
400.0000 mg | ORAL_TABLET | Freq: Every day | ORAL | Status: DC
  Administered 2024-08-08 – 2024-08-12 (×5): 400 mg via ORAL
  Filled 2024-08-08 (×5): qty 2

## 2024-08-08 MED ORDER — GABAPENTIN 300 MG PO CAPS
600.0000 mg | ORAL_CAPSULE | Freq: Three times a day (TID) | ORAL | Status: DC
  Administered 2024-08-08 – 2024-08-13 (×14): 600 mg via ORAL
  Filled 2024-08-08 (×14): qty 2

## 2024-08-08 MED ORDER — IPRATROPIUM-ALBUTEROL 0.5-2.5 (3) MG/3ML IN SOLN: 3.0000 mL | RESPIRATORY_TRACT | Status: DC | PRN

## 2024-08-08 MED ORDER — OXYCODONE HCL 5 MG PO TABS
5.0000 mg | ORAL_TABLET | Freq: Four times a day (QID) | ORAL | Status: DC | PRN
  Administered 2024-08-09 – 2024-08-12 (×7): 5 mg via ORAL
  Filled 2024-08-08 (×7): qty 1

## 2024-08-08 MED ORDER — CLONIDINE HCL 0.1 MG PO TABS
0.1000 mg | ORAL_TABLET | Freq: Two times a day (BID) | ORAL | Status: AC
  Administered 2024-08-08: 0.1 mg via ORAL
  Filled 2024-08-08: qty 1

## 2024-08-08 MED ORDER — ATORVASTATIN CALCIUM 20 MG PO TABS
40.0000 mg | ORAL_TABLET | Freq: Every day | ORAL | Status: DC
  Administered 2024-08-09 – 2024-08-13 (×5): 40 mg via ORAL
  Filled 2024-08-08 (×5): qty 2

## 2024-08-08 MED ORDER — DIPHENHYDRAMINE HCL 50 MG/ML IJ SOLN: 50.0000 mg | Freq: Three times a day (TID) | INTRAMUSCULAR | Status: DC | PRN

## 2024-08-08 MED ORDER — ALUM & MAG HYDROXIDE-SIMETH 200-200-20 MG/5ML PO SUSP: 30.0000 mL | ORAL | Status: DC | PRN

## 2024-08-08 MED ORDER — TRAZODONE HCL 50 MG PO TABS
50.0000 mg | ORAL_TABLET | Freq: Every evening | ORAL | Status: DC | PRN
  Administered 2024-08-10 – 2024-08-12 (×3): 50 mg via ORAL
  Filled 2024-08-08 (×3): qty 1

## 2024-08-08 MED ORDER — VENLAFAXINE HCL ER 37.5 MG PO CP24
37.5000 mg | ORAL_CAPSULE | Freq: Every day | ORAL | Status: DC
  Administered 2024-08-09 – 2024-08-10 (×2): 37.5 mg via ORAL
  Filled 2024-08-08 (×2): qty 1

## 2024-08-08 MED ORDER — HALOPERIDOL LACTATE 5 MG/ML IJ SOLN: 10.0000 mg | Freq: Three times a day (TID) | INTRAMUSCULAR | Status: DC | PRN

## 2024-08-08 MED ORDER — DIPHENHYDRAMINE HCL 25 MG PO CAPS: 50.0000 mg | ORAL_CAPSULE | Freq: Three times a day (TID) | ORAL | Status: DC | PRN

## 2024-08-08 MED ORDER — HALOPERIDOL LACTATE 5 MG/ML IJ SOLN: 5.0000 mg | Freq: Three times a day (TID) | INTRAMUSCULAR | Status: DC | PRN

## 2024-08-08 MED ORDER — NICOTINE POLACRILEX 2 MG MT GUM
2.0000 mg | CHEWING_GUM | OROMUCOSAL | Status: DC | PRN
  Administered 2024-08-10: 2 mg via ORAL
  Filled 2024-08-08: qty 1

## 2024-08-08 MED ORDER — OLANZAPINE 5 MG PO TABS: 5.0000 mg | ORAL_TABLET | Freq: Three times a day (TID) | ORAL | 0 refills | Status: DC | PRN

## 2024-08-08 NOTE — Telephone Encounter (Signed)
 Copied from CRM [id number]. Topic: Clinical - Medication Question >> Aug 08, 2024 10:09 AM [name] wrote: Reason for CRM: Pt called about the rolator equipment she is suppose to get. She is needing the number to place in [place] that she is suppose to go get it from. Please call pt and advise. I could not find it in the system.

## 2024-08-08 NOTE — Progress Notes (Signed)
 Pt refused to let me assess her buttucks/sacral area

## 2024-08-08 NOTE — Progress Notes (Signed)
 Occupational Therapy Treatment Patient Details Name: DECEMBER HEDTKE MRN: [id number] DOB: 02-Feb-1971 Today's Date: 08/08/2024   History of present illness presented to ER after being found unresponsive after drug overdose; admitted for management of severe hypoxic, hypercapnic respiratory failure and acute metabolic encephalopathy secondary to severe cocaine toxicity (requiring mechanical intubation 1/6-1/12/25)   OT comments  Chart reviewed to date, pt is in with nurse navigator Tiffanie, agreeable to OT tx session targeting improving ADL performance/bathing tasks. Improvements noted throughout, pt amb to bathroom and stand in shower with distant supervision including UB/LB bathing, dressing tasks. Pt is left in bed with sitter present, all needs met. OT will follow.       If plan is discharge home, recommend the following:  Supervision due to cognitive status;A little help with bathing/dressing/bathroom   Equipment Recommendations  None recommended by OT    Recommendations for Other Services      Precautions / Restrictions Precautions Precautions: Fall Recall of Precautions/Restrictions: Impaired Restrictions Weight Bearing Restrictions Per Provider Order: No       Mobility Bed Mobility Overal bed mobility: Modified Independent                  Transfers Overall transfer level: Needs assistance Equipment used: None Transfers: Sit to/from Stand Sit to Stand: Supervision                 Balance Overall balance assessment: Needs assistance Sitting-balance support: No upper extremity supported, Feet supported Sitting balance-Leahy Scale: Good     Standing balance support: No upper extremity supported Standing balance-Leahy Scale: Fair                             ADL either performed or assessed with clinical judgement   ADL Overall ADL's : Needs assistance/impaired     Grooming: Supervision/safety;Sitting;Standing   Upper Body Bathing:  Supervision/ safety;Standing Upper Body Bathing Details (indicate cue type and reason): in shower Lower Body Bathing: Supervison/ safety;Sit to/from stand Lower Body Bathing Details (indicate cue type and reason): in shower Upper Body Dressing : Supervision/safety;Standing Upper Body Dressing Details (indicate cue type and reason): donn scrub top Lower Body Dressing: Supervision/safety;Sit to/from stand Lower Body Dressing Details (indicate cue type and reason): donn scrub bottom, socks Toilet Transfer: Supervision/safety;Ambulation           Functional mobility during ADLs: Supervision/safety      Extremity/Trunk Assessment              Vision       Perception     Praxis     Communication Communication Communication: No apparent difficulties Factors Affecting Communication: Reduced clarity of speech (garbled at times)   Cognition Arousal: Alert Behavior During Therapy: Impulsive, Lability Cognition: Cognition impaired     Awareness: Online awareness impaired   Attention impairment (select first level of impairment): Selective attention Executive functioning impairment (select all impairments): Reasoning, Problem solving                   Following commands: Intact Following commands impaired: Follows one step commands inconsistently, Follows one step commands with increased time      Cueing   Cueing Techniques: Verbal cues, Tactile cues  Exercises Other Exercises Other Exercises: edu re role of OT    Shoulder Instructions       General Comments vss, previous trach site with intermittent wheezing    Pertinent Vitals/ Pain  Pain Assessment Pain Assessment: No/denies pain  Home Living                                          Prior Functioning/Environment              Frequency  Min 2X/week        Progress Toward Goals  OT Goals(current goals can now be found in the care plan section)  Progress towards OT  goals: Progressing toward goals  Acute Rehab OT Goals Time For Goal Achievement: 08/20/24  Plan      Co-evaluation                 AM-PAC OT 6 Clicks Daily Activity     Outcome Measure   Help from another person eating meals?: None Help from another person taking care of personal grooming?: None Help from another person toileting, which includes using toliet, bedpan, or urinal?: None Help from another person bathing (including washing, rinsing, drying)?: A Little Help from another person to put on and taking off regular upper body clothing?: None Help from another person to put on and taking off regular lower body clothing?: A Little 6 Click Score: 22    End of Session    OT Visit Diagnosis: Other abnormalities of gait and mobility (R26.89);Other symptoms and signs involving cognitive function   Activity Tolerance Patient tolerated treatment well   Patient Left in bed;with call bell/phone within reach;with nursing/sitter in room   Nurse Communication Mobility status        Time: 8896-8875 OT Time Calculation (min): 21 min  Charges: OT General Charges $OT Visit: 1 Visit OT Treatments $Self Care/Home Management : 8-22 mins  Therisa Sheffield, OTD OTR/L  08/08/24, 11:33 AM

## 2024-08-08 NOTE — Plan of Care (Signed)
  Problem: Education: Goal: Emotional status will improve Outcome: Not Progressing Goal: Mental status will improve Outcome: Not Progressing   Problem: Activity: Goal: Interest or engagement in activities will improve Outcome: Not Progressing   Problem: Coping: Goal: Ability to verbalize frustrations and anger appropriately will improve Outcome: Not Progressing

## 2024-08-08 NOTE — Group Note (Signed)
 [facility] LCSW Group Therapy Note   Group Date: 08/08/2024 Start Time: 1300 End Time: 1400   Type of Therapy/Topic:  Group Therapy:  Balance in Life  Participation Level:  Minimal   Description of Group:    This group will address the concept of balance and how it feels and looks when one is unbalanced. Patients will be encouraged to process areas in their lives that are out of balance, and identify reasons for remaining unbalanced. Facilitators will guide patients utilizing problem- solving interventions to address and correct the stressor making their life unbalanced. Understanding and applying boundaries will be explored and addressed for obtaining  and maintaining a balanced life. Patients will be encouraged to explore ways to assertively make their unbalanced needs known to significant others in their lives, using other group members and facilitator for support and feedback.  Therapeutic Goals: Patient will identify two or more emotions or situations they have that consume much of in their lives. Patient will identify signs/triggers that life has become out of balance:  Patient will identify two ways to set boundaries in order to achieve balance in their lives:  Patient will demonstrate ability to communicate their needs through discussion and/or role plays  Summary of Patient Progress: Patient was present for part of the group. She was minimally engaged in the discussion but her comments were pertinent when made. Pt insight into the topic is questionable. She appeared open and receptive to feedback/comments from both her peers and the facilitator.   Therapeutic Modalities:   Cognitive Behavioral Therapy Solution-Focused Therapy Assertiveness Training   Nadara JONELLE Fam, LCSW

## 2024-08-08 NOTE — Discharge Summary (Signed)
 " Physician Discharge Summary   Patient: Andrea Sanford MRN: [id number] DOB: 1970/09/06  Admit date:     07/30/2024  Discharge date: 08/08/24 to BHU at [facility] (inpatient)  Discharge Physician: Leita Blanch   PCP: Ziglar, Susan K, MD   Recommendations at discharge:    F/u PCP after you are discharged from [facility]   Discharge Diagnoses: Principal Problem:   Cocaine abuse with intoxication (HCC)   From H and P--  54 year old female with a past medical history significant for polysubstance abuse, bipolar disorder, generalized anxiety disorder, hypertension, hyperlipidemia, diabetes mellitus type 2 who presents to [facility] ED on 07/30/2024 after being found unresponsive.  Patient is currently intubated and unable to contribute to history and no family is currently available, therefore history is extremely limited and is obtained from chart review.   Per ED and nursing notes, she was [place] roadside via EMS after being found unresponsive with pinpoint pupils, was hypoxic along with respiratory arrest.  She was given Narcan  with improvement in respiratory status, however remained unresponsive.  EMS was told on scene that the patient's significant other had a similar event/overdose within the last hour as well.  There was concern that the patient could have taken Baclofen  as well.  No evidence of trauma.   Upon arrival to the ED she remained unresponsive with sonorous respirations for which she was intubated for airway protection. Extubated in MICU and weaned down to 1.5L prior to transfer to medicine service. Treated with precedex , clonidine  and phenobarbital  taper in the MICU  Patient overall is improving. She is followed by psychiatry on a daily basis. Medically no acute issue at present. Discussed with psychiatrist Dr. Jadapelle and [name] and will transfer patient to [facility] for her further management  Acute respiratory failure Acute toxic encephalopathy In the setting of recreational drug use.   Extubated and weaned to room air Weaned of precedex  drip Weaned of phenobarbital  taper Taper off clonidine    Chronic neuropathic pain Cont gabapentin    Acute debilitation Need to be independent of ADLs before discharge to inpatient psych ward  PT/OT following, appreciate recommendations--pt ambulating using walker around the nurses station and room   Suicidal ideations Concern for intentional overdose Committed to IVC now psych medications adjusted by psychiatrist. Psychiatry recommending patient to transfer to inpatient BHU for further evaluation management. Medically she is best at baseline. Discussed with psychiatry for transfer to [facility].       Consultants: psychiatry Procedures performed: mechanical ventilator Disposition: BHU [facility] Diet recommendation:  Cardiac diet DISCHARGE MEDICATION: Allergies as of 08/08/2024   No Known Allergies      Medication List     STOP taking these medications    ARTHRITIS STRENGTH BC POWDER PO   baclofen  10 MG tablet Commonly known as: LIORESAL    doxycycline  100 MG capsule Commonly known as: VIBRAMYCIN    predniSONE  10 MG (48) Tbpk tablet Commonly known as: STERAPRED UNI-PAK 48 TAB       TAKE these medications    Accu-Chek Guide Test test strip Generic drug: glucose blood 1 each by Other route 2 (two) times daily.   albuterol  108 (90 Base) MCG/ACT inhaler Commonly known as: VENTOLIN  HFA Inhale 2 puffs into the lungs every 6 (six) hours as needed for wheezing.   amLODipine  5 MG tablet Commonly known as: NORVASC  Take 1 tablet (5 mg total) by mouth daily.   atorvastatin  40 MG tablet Commonly known as: LIPITOR Take 1 tablet (40 mg total) by mouth daily.  cetirizine  10 MG tablet Commonly known as: ZYRTEC  Take 1 tablet (10 mg total) by mouth daily.   gabapentin  300 MG capsule Commonly known as: NEURONTIN  Take 2 capsules (600 mg total) by mouth 3 (three) times daily.   lactulose  10 GM/15ML solution Commonly known  as: CHRONULAC  Take 15 mLs (10 g total) by mouth 2 (two) times daily.   lidocaine  5 % Commonly known as: Lidoderm  Place 1 patch onto the skin daily. Remove & Discard patch within 12 hours or as directed by MD   meloxicam  15 MG tablet Commonly known as: MOBIC  Take 1 tablet (15 mg total) by mouth daily.   montelukast  10 MG tablet Commonly known as: SINGULAIR  Take 1 tablet (10 mg total) by mouth at bedtime.   nicotine  21 mg/24hr patch Commonly known as: NICODERM CQ  - dosed in mg/24 hours Place 1 patch (21 mg total) onto the skin daily.   OLANZapine  5 MG tablet Commonly known as: ZYPREXA  Take 1 tablet (5 mg total) by mouth every 8 (eight) hours as needed (agitation).   QUEtiapine  400 MG tablet Commonly known as: SEROquel  Take 1 tablet (400 mg total) by mouth at bedtime.   venlafaxine  XR 37.5 MG 24 hr capsule Commonly known as: EFFEXOR -XR Take 1 capsule (37.5 mg total) by mouth daily with breakfast. Start taking on: August 09, 2024        Follow-up Information     Ziglar, Susan K, MD Follow up.   Specialty: Family Medicine Why: hospital follow up Contact information: [address] [place] [address] [phone]                Discharge Exam: Filed Weights   08/06/24 0531 08/07/24 0350 08/08/24 0500  Weight: 81.5 kg 82.9 kg 87.1 kg  awake and alert. Appears calm respiratory clear to auscultation cardiovascular both heart sounds normal no murmur   Condition at discharge: fair  The results of significant diagnostics from this hospitalization (including imaging, microbiology, ancillary and laboratory) are listed below for reference.   Imaging Studies: EEG adult Result Date: 08/01/2024 Andrea Arlin KIDD, MD     08/01/2024  9:22 AM Patient Name: Andrea Sanford MRN: [id number] Epilepsy Attending: Arlin Sanford Andrea Referring Physician/Provider: Shellia Inge BIRCH, NP Date: 07/31/2024 Duration: 29.02 mins Patient history: 54yo F with ams. EEG to evaluate for  seizure Level of alertness: comatose AEDs during EEG study: GBP, Propofol  Technical aspects: This EEG study was done with scalp electrodes positioned according to the 10-20 International system of electrode placement. Electrical activity was reviewed with band pass filter of 1-70Hz , sensitivity of 7 uV/mm, display speed of 76mm/sec with a 60Hz  notched filter applied as appropriate. EEG data were recorded continuously and digitally stored.  Video monitoring was available and reviewed as appropriate. Description: EEG showed near continuous generalized amplitude sharply contoured 3 to 6 Hz theta and delta slowing admixed with 1 to 2 seconds of generalized EEG attenuation.  Hyperventilation and photic stimulation were not performed.   ABNORMALITY - Continuous slow, generalized IMPRESSION: This study is suggestive of generalized cerebral dysfunction (encephalopathy).  No seizures or epileptiform discharges were seen throughout the recording. Arlin Sanford Andrea   DG Abd Portable 1 View Result Date: 07/30/2024 EXAM: 1 VIEW XRAY OF THE ABDOMEN 07/30/2024 06:53:00 PM COMPARISON: 02/11/2024 CLINICAL HISTORY: NG tube placement FINDINGS: LINES, TUBES AND DEVICES: NG tube in the proximal to mid stomach. BOWEL: Nonobstructive bowel gas pattern. SOFT TISSUES: No abnormal calcifications. BONES: No acute fracture. IMPRESSION: 1. NG tube in the  proximal to mid stomach. 2. Nonobstructive bowel gas pattern. Electronically signed by: Franky Crease MD 07/30/2024 07:19 PM EST RP Workstation: [id number]   CT Cervical Spine Wo Contrast Result Date: 07/30/2024 CLINICAL DATA:  Suspected overdose. EXAM: CT CERVICAL SPINE WITHOUT CONTRAST TECHNIQUE: Multidetector CT imaging of the cervical spine was performed without intravenous contrast. Multiplanar CT image reconstructions were also generated. RADIATION DOSE REDUCTION: This exam was performed according to the departmental dose-optimization program which includes automated exposure control,  adjustment of the mA and/or kV according to patient size and/or use of iterative reconstruction technique. COMPARISON:  None Available. FINDINGS: Alignment: Normal. Skull base and vertebrae: No acute fracture. No primary bone lesion or focal pathologic process. Soft tissues and spinal canal: No prevertebral fluid or swelling. No visible canal hematoma. Disc levels: Mild to moderate severity endplate sclerosis is seen at the level of C5-C6, with normal multilevel endplates noted throughout the remainder of the cervical spine. There is mild intervertebral disc space narrowing at the level of C5-C6 with normal intervertebral disc spaces present throughout the remainder of the cervical spine. Mild to moderate severity bilateral multilevel facet joint hypertrophy is noted. Upper chest: Negative. Other: Endotracheal tube and orogastric tubes are in place. IMPRESSION: 1. No acute fracture or subluxation in the cervical spine. 2. Mild to moderate severity degenerative changes at the level of C5-C6. Electronically Signed   By: Suzen Dials M.D.   On: 07/30/2024 18:17   CT Head Wo Contrast Result Date: 07/30/2024 CLINICAL DATA:  Delirium. EXAM: CT HEAD WITHOUT CONTRAST TECHNIQUE: Contiguous axial images were obtained from the base of the skull through the vertex without intravenous contrast. RADIATION DOSE REDUCTION: This exam was performed according to the departmental dose-optimization program which includes automated exposure control, adjustment of the mA and/or kV according to patient size and/or use of iterative reconstruction technique. COMPARISON:  February 11, 2024 FINDINGS: Brain: No evidence of acute infarction, hemorrhage, hydrocephalus, extra-axial collection or mass lesion/mass effect. Vascular: No hyperdense vessel or unexpected calcification. Skull: Bilateral displaced nasal bone fractures of indeterminate age are noted. Sinuses/Orbits: Stable right maxillary sinus polyps versus mucous retention cysts are  seen. Other: Endotracheal and orogastric tubes are in place. IMPRESSION: 1. No acute intracranial abnormality. 2. Bilateral displaced nasal bone fractures of indeterminate age. 3. Stable right maxillary sinus polyps versus mucous retention cysts. Electronically Signed   By: Suzen Dials M.D.   On: 07/30/2024 18:11   DG Chest Port 1 View Result Date: 07/30/2024 EXAM: 1 VIEW(S) XRAY OF THE CHEST 07/30/2024 04:47:00 PM COMPARISON: 03/28/2024 CLINICAL HISTORY: Questionable sepsis - evaluate for abnormality FINDINGS: LINES, TUBES AND DEVICES: Endotracheal tube in place with tip 4 cm above the carina. Enteric tube in place extending below the diaphragm with tip beyond the inferior margin of the image. LUNGS AND PLEURA: No focal pulmonary opacity. No pleural effusion. No pneumothorax. HEART AND MEDIASTINUM: No acute abnormality of the cardiac and mediastinal silhouettes. BONES AND SOFT TISSUES: Old right clavicular fracture. IMPRESSION: 1. Endotracheal tube tip projects 4 cm above the carina. 2. Enteric tube extends below the diaphragm, with the tip beyond the inferior margin of the image. 3. No acute cardiopulmonary abnormality. Electronically signed by: Greig Pique MD 07/30/2024 05:31 PM EST RP Workstation: [id number]    Microbiology: Results for orders placed or performed during the hospital encounter of 07/30/24  Resp panel by RT-PCR (RSV, Flu A&B, Covid) Urine, Clean Catch     Status: None   Collection Time: 07/30/24  4:34 PM   Specimen:  Urine, Clean Catch; Nasal Swab  Result Value Ref Range Status   SARS Coronavirus 2 by RT PCR NEGATIVE NEGATIVE Final    Comment: (NOTE) SARS-CoV-2 target nucleic acids are NOT DETECTED.  The SARS-CoV-2 RNA is generally detectable in upper respiratory specimens during the acute phase of infection. The lowest concentration of SARS-CoV-2 viral copies this assay can detect is 138 copies/mL. A negative result does not preclude SARS-Cov-2 infection and should not be  used as the sole basis for treatment or other patient management decisions. A negative result may occur with  improper specimen collection/handling, submission of specimen other than nasopharyngeal swab, presence of viral mutation(s) within the areas targeted by this assay, and inadequate number of viral copies(<138 copies/mL). A negative result must be combined with clinical observations, patient history, and epidemiological information. The expected result is Negative.  Fact Sheet for Patients:  bloggercourse.com  Fact Sheet for Healthcare Providers:  seriousbroker.it  This test is no t yet approved or cleared by the [place]  FDA and  has been authorized for detection and/or diagnosis of SARS-CoV-2 by FDA under an Emergency Use Authorization (EUA). This EUA will remain  in effect (meaning this test can be used) for the duration of the COVID-19 declaration under Section 564(b)(1) of the Act, 21 U.S.C.section 360bbb-3(b)(1), unless the authorization is terminated  or revoked sooner.       Influenza A by PCR NEGATIVE NEGATIVE Final   Influenza B by PCR NEGATIVE NEGATIVE Final    Comment: (NOTE) The Xpert Xpress SARS-CoV-2/FLU/RSV plus assay is intended as an aid in the diagnosis of influenza from Nasopharyngeal swab specimens and should not be used as a sole basis for treatment. Nasal washings and aspirates are unacceptable for Xpert Xpress SARS-CoV-2/FLU/RSV testing.  Fact Sheet for Patients: bloggercourse.com  Fact Sheet for Healthcare Providers: seriousbroker.it  This test is not yet approved or cleared by the [place]  FDA and has been authorized for detection and/or diagnosis of SARS-CoV-2 by FDA under an Emergency Use Authorization (EUA). This EUA will remain in effect (meaning this test can be used) for the duration of the COVID-19 declaration under Section  564(b)(1) of the Act, 21 U.S.C. section 360bbb-3(b)(1), unless the authorization is terminated or revoked.     Resp Syncytial Virus by PCR NEGATIVE NEGATIVE Final    Comment: (NOTE) Fact Sheet for Patients: bloggercourse.com  Fact Sheet for Healthcare Providers: seriousbroker.it  This test is not yet approved or cleared by the [place]  FDA and has been authorized for detection and/or diagnosis of SARS-CoV-2 by FDA under an Emergency Use Authorization (EUA). This EUA will remain in effect (meaning this test can be used) for the duration of the COVID-19 declaration under Section 564(b)(1) of the Act, 21 U.S.C. section 360bbb-3(b)(1), unless the authorization is terminated or revoked.  Performed at [facility], [address] [place], [place] [address]   Blood culture (single)     Status: None   Collection Time: 07/30/24  4:34 PM   Specimen: BLOOD  Result Value Ref Range Status   Specimen Description BLOOD BLOOD RIGHT WRIST  Final   Special Requests   Final    BOTTLES DRAWN AEROBIC AND ANAEROBIC Blood Culture adequate volume   Culture   Final    NO GROWTH 5 DAYS Performed at [facility], [address] [place], [place] [address]    Report Status 08/04/2024 FINAL  Final  Urine Culture     Status: None   Collection Time: 07/30/24  4:34  PM   Specimen: Urine, Random  Result Value Ref Range Status   Specimen Description   Final    URINE, RANDOM Performed at [facility], [address] [place], [place] [address]    Special Requests   Final    NONE Reflexed from [id number] Performed at [facility], [address] [place], [place] [address]    Culture   Final    NO GROWTH Performed at [facility], [address], [place], [place] [address]    Report Status 07/31/2024 FINAL  Final  MRSA Next Gen by PCR, Nasal     Status: None   Collection Time: 07/30/24  6:54 PM    Specimen: Nasal Mucosa; Nasal Swab  Result Value Ref Range Status   MRSA by PCR Next Gen NOT DETECTED NOT DETECTED Final    Comment: (NOTE) The GeneXpert MRSA Assay (FDA approved for NASAL specimens only), is one component of a comprehensive MRSA colonization surveillance program. It is not intended to diagnose MRSA infection nor to guide or monitor treatment for MRSA infections. Test performance is not FDA approved in patients less than 28 years old. Performed at [facility], [address] [place], [place] [address]     Labs: CBC: Recent Labs  Lab 08/03/24 0404 08/04/24 0426 08/05/24 0404 08/06/24 0344 08/07/24 0343  WBC 6.2 6.0 5.6 8.5 6.2  HGB 11.5* 11.5* 11.1* 12.5 11.7*  HCT 36.8 35.9* 35.0* 37.7 34.8*  MCV 95.1 95.0 95.1 92.9 91.8  PLT 170 153 167 177 181   Basic Metabolic Panel: Recent Labs  Lab 08/02/24 0334 08/03/24 0404 08/04/24 0426 08/05/24 0404 08/06/24 0344 08/07/24 0343  NA 145 143 143 142 141 142  K 3.7 3.9 3.7 4.3 4.0 3.8  CL 109 109 110 110 106 106  CO2 26 27 27 26 28 28   GLUCOSE 121* 150* 130* 159* 125* 157*  BUN 15 26* 24* 25* 17 15  CREATININE 0.78 0.69 0.62 0.59 0.52 0.59  CALCIUM  8.8* 8.9 8.8* 8.9 9.1 8.9  MG 2.2 2.2  --  2.0 1.9 2.0  PHOS 3.8 4.4 3.5 3.7 3.1 3.6   Liver Function Tests: Recent Labs  Lab 08/02/24 0334 08/03/24 0404 08/04/24 0426 08/05/24 0404  ALBUMIN 3.5 3.4* 3.2* 3.2*   CBG: Recent Labs  Lab 08/07/24 1538 08/07/24 2056 08/08/24 0110 08/08/24 0412 08/08/24 0904  GLUCAP 119* 140* 108* 128* 114*    Discharge time spent: greater than 30 minutes.  Signed: Leita Blanch, MD [facility] 08/08/2024 "

## 2024-08-08 NOTE — Progress Notes (Signed)
Per Dr Posey Pronto, d.c tele monitoring

## 2024-08-08 NOTE — Progress Notes (Signed)
 Nutrition Follow-up  DOCUMENTATION CODES:   Not applicable  INTERVENTION:   -Continue regular diet -Continue MVI with minerals daily -Continue 100 mg thiamine  daily -D/c Juven -Magic cup TID with meals, each supplement provides 290 kcal and 9 grams of protein   NUTRITION DIAGNOSIS:   Inadequate oral intake related to inability to eat (pt sedated and ventilated) as evidenced by NPO status.  Progressing; advanced to regular diet on 08/06/24  GOAL:   Patient will meet greater than or equal to 90% of their needs  Progressing   MONITOR:   PO intake, Supplement acceptance, Labs, Weight trends, Skin, I & O's  REASON FOR ASSESSMENT:   Ventilator    ASSESSMENT:   54 y/o female with h/o DM, bipolar disorder, substance abuse, HTN, neuromuscular disorder, osteoporosis, anxiety, depression, COPD, OSA, chronic right clavicle fracture, homelessness and a lengthy admission for overdose complicated by aspiration PNA (requiring IR G-tube placed 09/04/23 & removed 12/13/23) and tracheostomy (placed 09/01/2023 & removed 11/06/23) and who is now admitted with metabolic encephalopathy secondary to cocaine toxicity requiring intubation for airway protection.  1/12- extubated 1/13- advanced to regular diet 1/14- IVC  Reviewed I/O's: +480 ml x 24 hours and +6.4 L since admission   Patient sitting up in bed at time of visit. Noted recruitment consultant.   Patient with good appetite on a regular diet. Meal completions 50-100%. Reviewed meal completions from yesterday (08/08/23): 1299 kcals and 43 grams protein (75% of estimated kcal needs and 48% of estimated protein needs.   Noted Juven supplements ordered, which patient is refusing.   Reviewed weights; have ranged from 79-87.1 kg over the past 78 days.   Medications reviewed and include lovenox , neurontin , and thimaine.   Labs reviewed: CBGS: 119-155 (inpatient orders for glycemic control are none).    Diet Order:   Diet Order             Diet  regular Room service appropriate? Yes; Fluid consistency: Thin  Diet effective now                   EDUCATION NEEDS:   No education needs have been identified at this time  Skin:  Skin Assessment: Skin Integrity Issues: Skin Integrity Issues:: Stage I Stage I: sacrum  Last BM:  08/07/24 (type 5)  Height:   Ht Readings from Last 1 Encounters:  07/30/24 5' 6 (1.676 m)    Weight:   Wt Readings from Last 1 Encounters:  08/08/24 87.1 kg    Ideal Body Weight:  59 kg  BMI:  Body mass index is 30.99 kg/m.  Estimated Nutritional Needs:   Kcal:  1750-1950  Protein:  90-105 grams  Fluid:  1.7-1.9 L    Margery ORN, RD, LDN, CDCES Registered Dietitian III Certified Diabetes Care and Education Specialist If unable to reach this RD, please use RD Inpatient group chat on secure chat between hours of 8am-4 pm daily

## 2024-08-08 NOTE — Group Note (Signed)
 Date:  08/08/2024 Time:  8:57 PM  Group Topic/Focus:  Emotional Education:   The focus of this group is to discuss what feelings/emotions are, and how they are experienced.    Pt did not attend group.  Jiyan Walkowski L 08/08/2024, 8:57 PM

## 2024-08-08 NOTE — Telephone Encounter (Signed)
 Routing CRM to Dr. Ziglar for review.

## 2024-08-08 NOTE — Group Note (Signed)
 Recreation Therapy Group Note   Group Topic:Health and Wellness  Group Date: 08/08/2024 Start Time: 1530 End Time: 1610 Facilitators: Celestia Jeoffrey BRAVO, LRT, CTRS Location: Dayroom  Group Description: Seated Exercise. LRT discussed the mental and physical benefits of exercise. LRT and group discussed how physical activity can be used as a coping skill. Pt's and LRT followed along to an exercise video on the TV screen that provided a visual representation and audio description of every exercise performed. Pt's encouraged to listen to their bodies and stop at any time if they experience feelings of discomfort or pain. Pts were encouraged to drink water  and stay hydrated.   Goal Area(s) Addressed:  Patient will learn benefits of physical activity. Patient will identify exercise as a coping skill.  Patient will follow multistep directions. Patient will try a new leisure interest.    Affect/Mood: N/A   Participation Level: Did not attend    Clinical Observations/Individualized Feedback: Patient did not attend.  Plan: Continue to engage patient in RT group sessions 2-3x/week.   Jeoffrey BRAVO Celestia, LRT, CTRS 08/08/2024 5:12 PM
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
## 2024-08-09 DIAGNOSIS — F332 Major depressive disorder, recurrent severe without psychotic features: Secondary | ICD-10-CM | POA: Diagnosis not present

## 2024-08-09 LAB — HEMOGLOBIN A1C
Hgb A1c MFr Bld: 6.2 % — ABNORMAL HIGH (ref 4.8–5.6)
Mean Plasma Glucose: 131.24 mg/dL

## 2024-08-09 LAB — LIPID PANEL
Cholesterol: 184 mg/dL (ref 0–200)
HDL: 30 mg/dL — ABNORMAL LOW
LDL Cholesterol: 122 mg/dL — ABNORMAL HIGH (ref 0–99)
Total CHOL/HDL Ratio: 6.1 ratio
Triglycerides: 161 mg/dL — ABNORMAL HIGH
VLDL: 32 mg/dL (ref 0–40)

## 2024-08-09 LAB — GLUCOSE, CAPILLARY: Glucose-Capillary: 169 mg/dL — ABNORMAL HIGH (ref 70–99)

## 2024-08-09 NOTE — Group Note (Signed)
 Date:  08/09/2024 Time:  12:00 PM  Group Topic/Focus:  Goals Group:   The focus of this group is to help patients establish daily goals to achieve during treatment and discuss how the patient can incorporate goal setting into their daily lives to aide in recovery.    Participation Level:  Minimal  Participation Quality:  Appropriate  Affect:  Appropriate  Cognitive:  Appropriate  Insight: Appropriate  Engagement in Group:  Engaged  Modes of Intervention:  Activity  Additional Comments:    Andrea Sanford June 08/09/2024, 12:00 PM

## 2024-08-09 NOTE — H&P (Signed)
 " Psychiatric Admission Assessment Adult  Patient Identification: Andrea Sanford MRN:  [id number] Date of Evaluation:  08/09/2024 Chief Complaint:  MDD (major depressive disorder), recurrent severe, without psychosis (HCC) [F33.2]   History of Present Illness: Patient assessed on medical floor following intentional baclofen  overdose. Became irritable and demanded to leave hospital without psychiatric care, necessitating initiation of involuntary commitment. On today's exam, patient reports anxiety related to wanting to leave and smoke. Cooperative with this provider but remains irritable consistent with previous encounters. Reports homelessness, desires placement in shelter near mother's residence, unable to stay with family members. Denies current suicidal ideations but made concerning statement: I am not going to hurt myself I have tried multiple times and it obviously does not work. Denies current homicidal ideations. Denies auditory or visual hallucinations. Endorsed feeling codependent on others with emotions easily influenced by others' behaviors. Reports she was scheduled with [facility] on the 6th but has been unable to notify them of hospitalization. Effexor  restarted at low dose (off medication since September 2025); patient endorses this is the only medication that has helped her depression. Denies side effects after several days of reinitiation.  Collateral obtained from mother with patient permission. Mother reports significant concern regarding patient's behaviors and current risk to self without adequate psychiatric treatment. Per mother, patient has overdosed three times within the past year, with February attempt requiring ventilation. Mother calm and cooperative but expressed concern about patient's psychiatric stability and self-harm risk.  Did the patient present with any abnormal findings indicating the need for additional neurological or psychological testing?  No  Total Time  spent with patient: 45 minutes Sleep  Sleep:Sleep: Fair  Past Psychiatric History:  Psychiatric History:  Information collected from patient/chart review   Prev Dx/Sx: Polysubstance abuse, mentioned history and chart review of bipolar disorder Current Psych Provider: None currently, patient reported their psychiatrist retired about 6 months ago Home Meds (current): Patient denied current medications Previous Med Trials: Effexor  which patient reported worked well for her but reported a previous provider quit prescribing cold turkey reported the last time she took this medication she believes was in September 2025 Therapy: None currently   Prior Psych Hospitalization: Yes Prior Violence: Denied   Family Psych History: Unsure Family Hx suicide: Unsure   Social History:    Legal Hx: Denied Living Situation: Homeless  Access to weapons/lethal means: Denied access to firearms   Substance History Alcohol: Endorsed, but reported has been sober for about a year Tobacco: Endorsed Illicit drugs: Crack use at least weekly as well as THC Prescription drug abuse: Denied Rehab hx: Endorsed Is the patient at risk to self? Yes.    Has the patient been a risk to self in the past 6 months? Yes.    Has the patient been a risk to self within the distant past? Yes.    Is the patient a risk to others? No.  Has the patient been a risk to others in the past 6 months? No.  Has the patient been a risk to others within the distant past? No.   Columbia Scale:  Flowsheet Row Admission (Current) from 08/08/2024 in [facility] ED from 04/07/2024 in [facility] ED from 04/06/2024 in [facility] Emergency Department at [facility]  C-SSRS RISK CATEGORY High Risk Low Risk Low Risk     Past Medical History:  Past Medical History:  Diagnosis Date   Allergy    Anxiety    Arthritis  Asthma    Bipolar 1 disorder (HCC)    Cancer (HCC)    COPD  (chronic obstructive pulmonary disease) (HCC)    Depression    Diabetes mellitus without complication (HCC)    Drug abuse (HCC)    Hypertension    Neuromuscular disorder (HCC)    Osteoporosis    PTSD (post-traumatic stress disorder) 04/07/2024   Sleep apnea     Past Surgical History:  Procedure Laterality Date   ANKLE SURGERY Right    IR GASTROSTOMY TUBE MOD SED  09/04/2023   IR GASTROSTOMY TUBE REMOVAL  12/13/2023   IR PATIENT EVAL TECH 0-60 MINS  11/03/2023   PERIPHERAL VASCULAR THROMBECTOMY Left 1991   TRACHEOSTOMY TUBE PLACEMENT N/A 09/01/2023   Procedure: TRACHEOSTOMY;  Surgeon: Blair Mt, MD;  Location: [facility] ORS;  Service: ENT;  Laterality: N/A;   Family History:  Family History  Problem Relation Age of Onset   Depression Mother    Diabetes Mother    Cancer Father     Social History:  Social History   Substance and Sexual Activity  Alcohol Use Not Currently   Alcohol/week: 2.0 standard drinks of alcohol   Types: 2 Standard drinks or equivalent per week     Social History   Substance and Sexual Activity  Drug Use Yes   Types: Cocaine   Comment: uses on occasions      Allergies:  Allergies[1] Lab Results:  Results for orders placed or performed during the hospital encounter of 08/08/24 (from the past 48 hours)  Glucose, capillary     Status: Abnormal   Collection Time: 08/09/24  7:32 AM  Result Value Ref Range   Glucose-Capillary 169 (H) 70 - 99 mg/dL    Comment: Glucose reference range applies only to samples taken after fasting for at least 8 hours.  Lipid panel     Status: Abnormal   Collection Time: 08/09/24  9:28 AM  Result Value Ref Range   Cholesterol 184 0 - 200 mg/dL    Comment:        ATP III CLASSIFICATION:  <200     mg/dL   Desirable  799-760  mg/dL   Borderline High  >=759    mg/dL   High           Triglycerides 161 (H) <150 mg/dL   HDL 30 (L) >59 mg/dL   Total CHOL/HDL Ratio 6.1 RATIO   VLDL 32 0 - 40 mg/dL   LDL Cholesterol 877 (H)  0 - 99 mg/dL    Comment:        Total Cholesterol/HDL:CHD Risk Coronary Heart Disease Risk Table                     Men   Women  1/2 Average Risk   3.4   3.3  Average Risk       5.0   4.4  2 X Average Risk   9.6   7.1  3 X Average Risk  23.4   11.0        Use the calculated Patient Ratio above and the CHD Risk Table to determine the patient's CHD Risk.        ATP III CLASSIFICATION (LDL):  <100     mg/dL   Optimal  899-870  mg/dL   Near or Above                    Optimal  130-159  mg/dL   Borderline  160-189  mg/dL   High  >809     mg/dL   Very High Performed at [facility], [address] [place], [place] [address]     Blood Alcohol level:  Lab Results  Component Value Date   [facility] <15 04/06/2024   [facility] <15 02/11/2024    Metabolic Disorder Labs:  Lab Results  Component Value Date   HGBA1C 6.2 (A) 04/24/2024   MPG 148.46 09/14/2023   No results found for: PROLACTIN Lab Results  Component Value Date   CHOL 184 08/09/2024   TRIG 161 (H) 08/09/2024   HDL 30 (L) 08/09/2024   CHOLHDL 6.1 08/09/2024   VLDL 32 08/09/2024   LDLCALC 122 (H) 08/09/2024   LDLCALC 28 04/08/2024    Current Medications: Current Facility-Administered Medications  Medication Dose Route Frequency Provider Last Rate Last Admin   acetaminophen  (TYLENOL ) tablet 650 mg  650 mg Oral Q6H PRN Jadapalle, Sree, MD   650 mg at 08/08/24 1716   alum & mag hydroxide-simeth (MAALOX/MYLANTA) 200-200-20 MG/5ML suspension 30 mL  30 mL Oral Q4H PRN Jadapalle, Sree, MD       atorvastatin  (LIPITOR) tablet 40 mg  40 mg Oral Daily Jadapalle, Sree, MD   40 mg at 08/09/24 0840   diphenhydrAMINE  (BENADRYL ) capsule 25 mg  25 mg Oral Q6H PRN Jadapalle, Sree, MD       haloperidol  (HALDOL ) tablet 5 mg  5 mg Oral TID PRN Jadapalle, Sree, MD       And   diphenhydrAMINE  (BENADRYL ) capsule 50 mg  50 mg Oral TID PRN Jadapalle, Sree, MD       haloperidol  lactate (HALDOL ) injection 5 mg  5 mg Intramuscular TID  PRN Jadapalle, Sree, MD       And   diphenhydrAMINE  (BENADRYL ) injection 50 mg  50 mg Intramuscular TID PRN Jadapalle, Sree, MD       And   LORazepam  (ATIVAN ) injection 2 mg  2 mg Intramuscular TID PRN Jadapalle, Sree, MD       haloperidol  lactate (HALDOL ) injection 10 mg  10 mg Intramuscular TID PRN Jadapalle, Sree, MD       And   diphenhydrAMINE  (BENADRYL ) injection 50 mg  50 mg Intramuscular TID PRN Jadapalle, Sree, MD       And   LORazepam  (ATIVAN ) injection 2 mg  2 mg Intramuscular TID PRN Jadapalle, Sree, MD       gabapentin  (NEURONTIN ) capsule 600 mg  600 mg Oral TID Jadapalle, Sree, MD   600 mg at 08/09/24 0840   hydrALAZINE  (APRESOLINE ) injection 10-20 mg  10-20 mg Intravenous Q6H PRN Donnelly Mellow, MD       hydrOXYzine  (ATARAX ) tablet 25 mg  25 mg Oral TID PRN Jadapalle, Sree, MD       ipratropium-albuterol  (DUONEB) 0.5-2.5 (3) MG/3ML nebulizer solution 3 mL  3 mL Nebulization Q4H PRN Donnelly Mellow, MD       nicotine  (NICODERM CQ  - dosed in mg/24 hours) patch 21 mg  21 mg Transdermal Daily Jadapalle, Sree, MD   21 mg at 08/09/24 9157   nicotine  polacrilex (NICORETTE ) gum 2 mg  2 mg Oral PRN Jadapalle, Sree, MD       oxyCODONE  (Oxy IR/ROXICODONE ) immediate release tablet 5 mg  5 mg Oral Q6H PRN Jadapalle, Sree, MD   5 mg at 08/09/24 9365   QUEtiapine  (SEROQUEL ) tablet 400 mg  400 mg Oral QHS Jadapalle, Sree, MD   400 mg at 08/08/24 2119   traZODone  (DESYREL )  tablet 50 mg  50 mg Oral QHS PRN Jadapalle, Sree, MD       venlafaxine  XR (EFFEXOR -XR) 24 hr capsule 37.5 mg  37.5 mg Oral Q breakfast Jadapalle, Sree, MD   37.5 mg at 08/09/24 0840   PTA Medications: Medications Prior to Admission  Medication Sig Dispense Refill Last Dose/Taking   ACCU-CHEK GUIDE TEST test strip 1 each by Other route 2 (two) times daily. 100 each 5    albuterol  (VENTOLIN  HFA) 108 (90 Base) MCG/ACT inhaler Inhale 2 puffs into the lungs every 6 (six) hours as needed for wheezing. 2 each 5    amLODipine   (NORVASC ) 5 MG tablet Take 1 tablet (5 mg total) by mouth daily. 90 tablet 1    atorvastatin  (LIPITOR) 40 MG tablet Take 1 tablet (40 mg total) by mouth daily. 30 tablet 5    cetirizine  (ZYRTEC ) 10 MG tablet Take 1 tablet (10 mg total) by mouth daily. 30 tablet 11    gabapentin  (NEURONTIN ) 300 MG capsule Take 2 capsules (600 mg total) by mouth 3 (three) times daily. 180 capsule 1    lactulose  (CHRONULAC ) 10 GM/15ML solution Take 15 mLs (10 g total) by mouth 2 (two) times daily. 236 mL 0    lidocaine  (LIDODERM ) 5 % Place 1 patch onto the skin daily. Remove & Discard patch within 12 hours or as directed by MD 30 patch 5    meloxicam  (MOBIC ) 15 MG tablet Take 1 tablet (15 mg total) by mouth daily. 30 tablet 1    montelukast  (SINGULAIR ) 10 MG tablet Take 1 tablet (10 mg total) by mouth at bedtime. 30 tablet 5    nicotine  (NICODERM CQ  - DOSED IN MG/24 HOURS) 21 mg/24hr patch Place 1 patch (21 mg total) onto the skin daily. (Patient not taking: No sig reported) 7 patch 0    OLANZapine  (ZYPREXA ) 5 MG tablet Take 1 tablet (5 mg total) by mouth every 8 (eight) hours as needed (agitation). 10 tablet 0    QUEtiapine  (SEROQUEL ) 400 MG tablet Take 1 tablet (400 mg total) by mouth at bedtime. 30 tablet 1    venlafaxine  XR (EFFEXOR -XR) 37.5 MG 24 hr capsule Take 1 capsule (37.5 mg total) by mouth daily with breakfast. 10 capsule 0     Psychiatric Specialty Exam:  Presentation  General Appearance:  Appropriate for Environment  Eye Contact: Fair  Speech: Clear and Coherent  Speech Volume: Normal    Mood and Affect  Mood: Anxious  Affect: Congruent   Thought Process  Thought Processes: Coherent; Linear  Descriptions of Associations:Intact  Orientation:Full (Time, Place and Person)  Thought Content:Logical  Hallucinations:Hallucinations: None  Ideas of Reference:None  Suicidal Thoughts:Suicidal Thoughts: No  Homicidal Thoughts:Homicidal Thoughts: No   Sensorium   Memory: Immediate Fair; Recent Fair; Remote Fair  Judgment: Impaired  Insight: Shallow   Executive Functions  Concentration: Fair  Attention Span: Fair  Recall: [employer] of Knowledge: Fair  Language: Fair   Psychomotor Activity  Psychomotor Activity: Psychomotor Activity: Normal   Assets  Assets: Communication Skills; Desire for Improvement; Social Support    Musculoskeletal: Strength & Muscle Tone: within normal limits Gait & Station: Using walker  Physical Exam: Physical Exam Pulmonary:     Effort: Pulmonary effort is normal.  Neurological:     Mental Status: She is alert and oriented to person, place, and time.    Review of Systems  Respiratory:  Negative for shortness of breath.   Gastrointestinal:  Negative for diarrhea and nausea.  Neurological:  Negative for dizziness and headaches.  Psychiatric/Behavioral:  Positive for depression and substance abuse. Negative for hallucinations and suicidal ideas. The patient is nervous/anxious.    Blood pressure (!) 144/86, pulse 67, temperature 98.8 F (37.1 C), temperature source Oral, resp. rate 16, height 5' 7 (1.702 m), weight 79.9 kg, SpO2 97%. Body mass index is 27.6 kg/m.  Principal Diagnosis: MDD (major depressive disorder), recurrent severe, without psychosis (HCC) Diagnosis:  Principal Problem:   MDD (major depressive disorder), recurrent severe, without psychosis (HCC)   Clinical Decision Making:  Treatment Plan Summary:  Safety and Monitoring:             -- Voluntary admission to inpatient psychiatric unit for safety, stabilization and treatment             -- Daily contact with patient to assess and evaluate symptoms and progress in treatment             -- Patient's case to be discussed in multi-disciplinary team meeting             -- Observation Level: q15 minute checks             -- Vital signs:  q12 hours             -- Precautions: suicide, elopement, and assault   2.  Psychiatric Diagnoses and Treatment:                -Continue Effexor  with plan to titrate up to target depressive and anxiety symptoms. Patient currently on 37.5 mg dose daily -Hydroxyzine  25 mg 3 times daily PRN -Continue seroquel  400 mg daily at bedtime   -- The risks/benefits/side-effects/alternatives to this medication were discussed in detail with the patient and time was given for questions. The patient consents to medication trial.                -- Metabolic profile and EKG monitoring obtained while on an atypical antipsychotic (BMI: Lipid Panel: HbgA1c: QTc:)              -- Encouraged patient to participate in unit milieu and in scheduled group therapies                            3. Medical Issues Being Addressed:   -Patient denied any acute concerns currently    4. Discharge Planning:              -- Social work and case management to assist with discharge planning and identification of hospital follow-up needs prior to discharge             -- Estimated LOS: 5-7 days             -- Discharge Concerns: Need to establish a safety plan; Medication compliance and effectiveness             -- Discharge Goals: Return home with outpatient referrals follow ups  Physician Treatment Plan for Primary Diagnosis: MDD (major depressive disorder), recurrent severe, without psychosis (HCC) Long Term Goal(s): Improvement in symptoms so as ready for discharge  Short Term Goals: Ability to identify changes in lifestyle to reduce recurrence of condition will improve, Ability to verbalize feelings will improve, Ability to disclose and discuss suicidal ideas, Ability to demonstrate self-control will improve, Ability to identify and develop effective coping behaviors will improve, Ability to maintain clinical measurements within normal limits will improve, Compliance with prescribed medications  will improve, and Ability to identify triggers associated with substance abuse/mental health issues will  improve    I certify that inpatient services furnished can reasonably be expected to improve the patient's condition.    [name], NP This note was created using Dragon dictation software. Please excuse any inadvertent transcription errors. Case was discussed with supervising physician Dr. Jadapalle who is agreeable with current plan.       [1] No Known Allergies  "

## 2024-08-09 NOTE — BH IP Treatment Plan (Signed)
 Interdisciplinary Treatment and Diagnostic Plan Update  08/09/2024 Time of Session: 10:20 Andrea Sanford MRN: [id number]  Principal Diagnosis: MDD (major depressive disorder), recurrent severe, without psychosis (HCC)  Secondary Diagnoses: Principal Problem:   MDD (major depressive disorder), recurrent severe, without psychosis (HCC)   Current Medications:  Current Facility-Administered Medications  Medication Dose Route Frequency Provider Last Rate Last Admin   acetaminophen  (TYLENOL ) tablet 650 mg  650 mg Oral Q6H PRN Jadapalle, Sree, MD   650 mg at 08/08/24 1716   alum & mag hydroxide-simeth (MAALOX/MYLANTA) 200-200-20 MG/5ML suspension 30 mL  30 mL Oral Q4H PRN Jadapalle, Sree, MD       atorvastatin  (LIPITOR) tablet 40 mg  40 mg Oral Daily Jadapalle, Sree, MD   40 mg at 08/09/24 0840   diphenhydrAMINE  (BENADRYL ) capsule 25 mg  25 mg Oral Q6H PRN Jadapalle, Sree, MD       haloperidol  (HALDOL ) tablet 5 mg  5 mg Oral TID PRN Jadapalle, Sree, MD       And   diphenhydrAMINE  (BENADRYL ) capsule 50 mg  50 mg Oral TID PRN Jadapalle, Sree, MD       haloperidol  lactate (HALDOL ) injection 5 mg  5 mg Intramuscular TID PRN Jadapalle, Sree, MD       And   diphenhydrAMINE  (BENADRYL ) injection 50 mg  50 mg Intramuscular TID PRN Jadapalle, Sree, MD       And   LORazepam  (ATIVAN ) injection 2 mg  2 mg Intramuscular TID PRN Jadapalle, Sree, MD       haloperidol  lactate (HALDOL ) injection 10 mg  10 mg Intramuscular TID PRN Jadapalle, Sree, MD       And   diphenhydrAMINE  (BENADRYL ) injection 50 mg  50 mg Intramuscular TID PRN Jadapalle, Sree, MD       And   LORazepam  (ATIVAN ) injection 2 mg  2 mg Intramuscular TID PRN Jadapalle, Sree, MD       gabapentin  (NEURONTIN ) capsule 600 mg  600 mg Oral TID Jadapalle, Sree, MD   600 mg at 08/09/24 0840   hydrALAZINE  (APRESOLINE ) injection 10-20 mg  10-20 mg Intravenous Q6H PRN Donnelly Mellow, MD       hydrOXYzine  (ATARAX ) tablet 25 mg  25 mg Oral TID PRN  Jadapalle, Sree, MD       ipratropium-albuterol  (DUONEB) 0.5-2.5 (3) MG/3ML nebulizer solution 3 mL  3 mL Nebulization Q4H PRN Donnelly Mellow, MD       nicotine  (NICODERM CQ  - dosed in mg/24 hours) patch 21 mg  21 mg Transdermal Daily Jadapalle, Sree, MD   21 mg at 08/09/24 9157   nicotine  polacrilex (NICORETTE ) gum 2 mg  2 mg Oral PRN Jadapalle, Sree, MD       oxyCODONE  (Oxy IR/ROXICODONE ) immediate release tablet 5 mg  5 mg Oral Q6H PRN Jadapalle, Sree, MD   5 mg at 08/09/24 9365   QUEtiapine  (SEROQUEL ) tablet 400 mg  400 mg Oral QHS Jadapalle, Sree, MD   400 mg at 08/08/24 2119   traZODone  (DESYREL ) tablet 50 mg  50 mg Oral QHS PRN Jadapalle, Sree, MD       venlafaxine  XR (EFFEXOR -XR) 24 hr capsule 37.5 mg  37.5 mg Oral Q breakfast Jadapalle, Sree, MD   37.5 mg at 08/09/24 0840   PTA Medications: Medications Prior to Admission  Medication Sig Dispense Refill Last Dose/Taking   ACCU-CHEK GUIDE TEST test strip 1 each by Other route 2 (two) times daily. 100 each 5    albuterol  (VENTOLIN  HFA) 108 (  90 Base) MCG/ACT inhaler Inhale 2 puffs into the lungs every 6 (six) hours as needed for wheezing. 2 each 5    amLODipine  (NORVASC ) 5 MG tablet Take 1 tablet (5 mg total) by mouth daily. 90 tablet 1    atorvastatin  (LIPITOR) 40 MG tablet Take 1 tablet (40 mg total) by mouth daily. 30 tablet 5    cetirizine  (ZYRTEC ) 10 MG tablet Take 1 tablet (10 mg total) by mouth daily. 30 tablet 11    gabapentin  (NEURONTIN ) 300 MG capsule Take 2 capsules (600 mg total) by mouth 3 (three) times daily. 180 capsule 1    lactulose  (CHRONULAC ) 10 GM/15ML solution Take 15 mLs (10 g total) by mouth 2 (two) times daily. 236 mL 0    lidocaine  (LIDODERM ) 5 % Place 1 patch onto the skin daily. Remove & Discard patch within 12 hours or as directed by MD 30 patch 5    meloxicam  (MOBIC ) 15 MG tablet Take 1 tablet (15 mg total) by mouth daily. 30 tablet 1    montelukast  (SINGULAIR ) 10 MG tablet Take 1 tablet (10 mg total) by mouth  at bedtime. 30 tablet 5    nicotine  (NICODERM CQ  - DOSED IN MG/24 HOURS) 21 mg/24hr patch Place 1 patch (21 mg total) onto the skin daily. (Patient not taking: No sig reported) 7 patch 0    OLANZapine  (ZYPREXA ) 5 MG tablet Take 1 tablet (5 mg total) by mouth every 8 (eight) hours as needed (agitation). 10 tablet 0    QUEtiapine  (SEROQUEL ) 400 MG tablet Take 1 tablet (400 mg total) by mouth at bedtime. 30 tablet 1    venlafaxine  XR (EFFEXOR -XR) 37.5 MG 24 hr capsule Take 1 capsule (37.5 mg total) by mouth daily with breakfast. 10 capsule 0     Patient Stressors:    Patient Strengths:    Treatment Modalities: Medication Management, Group therapy, Case management,  1 to 1 session with clinician, Psychoeducation, Recreational therapy.   Physician Treatment Plan for Primary Diagnosis: MDD (major depressive disorder), recurrent severe, without psychosis (HCC) Long Term Goal(s):     Short Term Goals:    Medication Management: Evaluate patient's response, side effects, and tolerance of medication regimen.  Therapeutic Interventions: 1 to 1 sessions, Unit Group sessions and Medication administration.  Evaluation of Outcomes: Not Met  Physician Treatment Plan for Secondary Diagnosis: Principal Problem:   MDD (major depressive disorder), recurrent severe, without psychosis (HCC)  Long Term Goal(s):     Short Term Goals:       Medication Management: Evaluate patient's response, side effects, and tolerance of medication regimen.  Therapeutic Interventions: 1 to 1 sessions, Unit Group sessions and Medication administration.  Evaluation of Outcomes: Not Met   RN Treatment Plan for Primary Diagnosis: MDD (major depressive disorder), recurrent severe, without psychosis (HCC) Long Term Goal(s): Knowledge of disease and therapeutic regimen to maintain health will improve  Short Term Goals: Ability to remain free from injury will improve, Ability to verbalize frustration and anger  appropriately will improve, Ability to demonstrate self-control, Ability to participate in decision making will improve, Ability to verbalize feelings will improve, Ability to disclose and discuss suicidal ideas, Ability to identify and develop effective coping behaviors will improve, and Compliance with prescribed medications will improve  Medication Management: RN will administer medications as ordered by provider, will assess and evaluate patient's response and provide education to patient for prescribed medication. RN will report any adverse and/or side effects to prescribing provider.  Therapeutic Interventions: 1 on  1 counseling sessions, Psychoeducation, Medication administration, Evaluate responses to treatment, Monitor vital signs and CBGs as ordered, Perform/monitor CIWA, COWS, AIMS and Fall Risk screenings as ordered, Perform wound care treatments as ordered.  Evaluation of Outcomes: Not Met   LCSW Treatment Plan for Primary Diagnosis: MDD (major depressive disorder), recurrent severe, without psychosis (HCC) Long Term Goal(s): Safe transition to appropriate next level of care at discharge, Engage patient in therapeutic group addressing interpersonal concerns.  Short Term Goals: Engage patient in aftercare planning with referrals and resources, Increase social support, Increase ability to appropriately verbalize feelings, Increase emotional regulation, Facilitate acceptance of mental health diagnosis and concerns, Facilitate patient progression through stages of change regarding substance use diagnoses and concerns, Identify triggers associated with mental health/substance abuse issues, and Increase skills for wellness and recovery  Therapeutic Interventions: Assess for all discharge needs, 1 to 1 time with Social worker, Explore available resources and support systems, Assess for adequacy in community support network, Educate family and significant other(s) on suicide prevention, Complete  Psychosocial Assessment, Interpersonal group therapy.  Evaluation of Outcomes: Not Met   Progress in Treatment: Attending groups: Yes. Participating in groups: Yes. Taking medication as prescribed: Yes. Toleration medication: Yes. Family/Significant other contact made: No, will contact:  when given permission.  Patient understands diagnosis: Yes. Discussing patient identified problems/goals with staff: Yes. Medical problems stabilized or resolved: Yes. Denies suicidal/homicidal ideation: Yes. Issues/concerns per patient self-inventory: No. Other: none.  New problem(s) identified: No, Describe:  none identified.   New Short Term/Long Term Goal(s): medication management for mood stabilization; elimination of SI thoughts; development of comprehensive mental wellness/sobriety plan.  Patient Goals:  To have a decent life. I don't want to do drugs anymore.   Discharge Plan or Barriers: CSW will assist patient with development of an appropriate aftercare/discharge plan.   Reason for Continuation of Hospitalization: Depression Medication stabilization Suicidal ideation  Estimated Length of Stay: 1-7 days  Last 3 [place] Suicide Severity Risk Score: Flowsheet Row Admission (Current) from 08/08/2024 in [facility] INPATIENT BE[facility] MEDICINE ED from 04/07/2024 in [facility] ED from 03/28/2024 in [facility] Emergency Department at [facility]  C-SSRS RISK CATEGORY High Risk Low Risk No Risk    Last PHQ 2/9 Scores:    05/21/2024    2:35 PM 04/07/2024    3:38 PM 04/02/2024   12:10 PM  Depression screen PHQ 2/9  Decreased Interest 3 3 2   Down, Depressed, Hopeless 3 3 1   PHQ - 2 Score 6 6 3   Altered sleeping 3 3 2   Tired, decreased energy 0 3 1  Change in appetite 1 0 2  Feeling bad or failure about yourself  3 3 2   Trouble concentrating 3 3 1   Moving slowly or fidgety/restless 2 3 0  Suicidal thoughts 3 3 0  PHQ-9 Score 21  24  11    Difficult doing  work/chores Very difficult Very difficult      Data saved with a previous flowsheet row definition    Scribe for Treatment Team: Nadara JONELLE Fam, LCSW 08/09/2024 10:46 AM

## 2024-08-09 NOTE — Progress Notes (Signed)
" °   08/09/24 0600  Psych Admission Type (Psych Patients Only)  Admission Status Involuntary  Psychosocial Assessment  Patient Complaints Anxiety  Eye Contact Other (Comment) (WNL)  Facial Expression Sad  Affect Sad  Speech Logical/coherent  Interaction Assertive  Motor Activity Slow  Appearance/Hygiene In scrubs  Behavior Characteristics Cooperative;Calm  Mood Pleasant  Aggressive Behavior  Effect No apparent injury  Thought Process  Coherency WDL  Content WDL  Delusions None reported or observed  Perception WDL  Hallucination None reported or observed  Judgment Impaired  Confusion WDL  Danger to Self  Current suicidal ideation? Denies  Danger to Others  Danger to Others None reported or observed    "

## 2024-08-09 NOTE — Group Note (Signed)
 Recreation Therapy Group Note   Group Topic:Leisure Education  Group Date: 08/09/2024 Start Time: 1030 End Time: 1140 Facilitators: Celestia Jeoffrey BRAVO, LRT, CTRS Location: Craft Room  Group Description: Leisure. Patients were given the option to choose from journaling, coloring, drawing, making origami, playing with playdoh, listening to music or singing karaoke. LRT and pts discussed the meaning of leisure, the importance of participating in leisure during their free time/when they're outside of the hospital, as well as how our leisure interests can also serve as coping skills.   Goal Area(s) Addressed:  Patient will identify a current leisure interest.  Patient will learn the definition of leisure. Patient will practice making a positive decision. Patient will have the opportunity to try a new leisure activity. Patient will communicate with peers and LRT.    Affect/Mood: N/A   Participation Level: Did not attend    Clinical Observations/Individualized Feedback: Patient did not attend.  Plan: Continue to engage patient in RT group sessions 2-3x/week.   Jeoffrey BRAVO Celestia, LRT, CTRS 08/09/2024 11:58 AM

## 2024-08-09 NOTE — Plan of Care (Signed)
   Problem: Education: Goal: Emotional status will improve Outcome: Progressing Goal: Mental status will improve Outcome: Progressing Goal: Verbalization of understanding the information provided will improve Outcome: Progressing
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
## 2024-08-09 NOTE — Plan of Care (Signed)
" °  Problem: Education: Goal: Knowledge of [place] [facility] Education information/materials will improve Outcome: Not Progressing Goal: Emotional status will improve Outcome: Not Progressing Goal: Mental status will improve Outcome: Not Progressing Goal: Verbalization of understanding the information provided will improve Outcome: Not Progressing   Problem: Coping: Goal: Ability to demonstrate self-control will improve Outcome: Not Progressing   Problem: Health Behavior/Discharge Planning: Goal: Identification of resources available to assist in meeting health care needs will improve Outcome: Not Progressing Goal: Compliance with treatment plan for underlying cause of condition will improve Outcome: Not Progressing   Problem: Physical Regulation: Goal: Ability to maintain clinical measurements within normal limits will improve Outcome: Not Progressing   Problem: Safety: Goal: Periods of time without injury will increase Outcome: Not Progressing   "

## 2024-08-09 NOTE — Group Note (Signed)
 Date:  08/09/2024 Time:  10:50 PM  Group Topic/Focus:  Healthy Communication:   The focus of this group is to discuss communication, barriers to communication, as well as healthy ways to communicate with others.    Pt did not attend group.  Daren Doswell L 08/09/2024, 10:50 PM

## 2024-08-09 NOTE — BHH Counselor (Signed)
 CSW attempted to meet with pt for completion of assessment, however, pt was agitated after being informed that visitation is not until the evening. CSW team will attempt assessment at a later time.   Nadara SAUNDERS. Chaim, MSW, LCSW, LCAS 08/09/2024 4:20 PM

## 2024-08-09 NOTE — Group Note (Signed)
 Recreation Therapy Group Note   Group Topic:Stress Management  Group Date: 08/09/2024 Start Time: 1530 End Time: 1610 Facilitators: Celestia Jeoffrey BRAVO, LRT, CTRS Location: Dayroom  Group Description: Taboo. LRT and patients played the game Taboo. The object of the game is to have peers guess the word up at the top of the card drawn that is in bold, while being sure to not use any of the descriptive words down below it on the same card. If the person attempting to explain the word in bold uses any of the descriptive words down below, they lose their turn, and no one receives that card or point. LRT and patient's took turns being the one to describe the words while the rest of the group tried to guess what they were describing.   Goal Area(s) Addressed: Patient will identify physical symptoms of stress. Patient will identify emotional symptoms of stress. Patient will identify coping skills for stress. Patient will build frustration tolerance skills.  Patient will increase communication.   Affect/Mood: N/A   Participation Level: Did not attend    Clinical Observations/Individualized Feedback: Patient did not attend.  Plan: Continue to engage patient in RT group sessions 2-3x/week.   Jeoffrey BRAVO Celestia, LRT, CTRS 08/09/2024 4:53 PM

## 2024-08-10 MED ORDER — COLLAGENASE 250 UNIT/GM EX OINT
1.0000 | TOPICAL_OINTMENT | Freq: Every day | CUTANEOUS | Status: DC
  Administered 2024-08-11 – 2024-08-12 (×2): 1 via TOPICAL
  Filled 2024-08-10 (×2): qty 30

## 2024-08-10 MED ORDER — VENLAFAXINE HCL ER 75 MG PO CP24
75.0000 mg | ORAL_CAPSULE | Freq: Every day | ORAL | Status: DC
  Administered 2024-08-11 – 2024-08-13 (×3): 75 mg via ORAL
  Filled 2024-08-10 (×3): qty 1

## 2024-08-10 NOTE — Group Note (Signed)
 Date:  08/10/2024 Time:  9:54 PM  Group Topic/Focus:  Managing Feelings:   The focus of this group is to identify what feelings patients have difficulty handling and develop a plan to handle them in a healthier way upon discharge. Self Care:   The focus of this group is to help patients understand the importance of self-care in order to improve or restore emotional, physical, spiritual, interpersonal, and financial health. Wrap-Up Group:   The focus of this group is to help patients review their daily goal of treatment and discuss progress on daily workbooks.    Participation Level:  Active  Participation Quality:  Inattentive  Affect:  Appropriate and Flat  Cognitive:  Appropriate and Oriented  Insight: Appropriate  Engagement in Group:  Engaged  Modes of Intervention:  Discussion and Support  Additional Comments:  N/A  Butler LITTIE Gelineau 08/10/2024, 9:54 PM

## 2024-08-10 NOTE — Consult Note (Signed)
 WOC Nurse Consult Note: Reason for Consult:; sacral wound Wound type: pressure injury- Stage 3 Pressure Injury POA: Yes Measurement: see nursing flow sheet Wound azi:typuz with some partial thickness skin lost distally  Drainage (amount, consistency, odor) see nursing flow sheet Periwound: macerated  Dressing procedure/placement/frequency: Cleanse wound with saline, pat dry Apply 1/4 thick layer of Santyl  to the wound and top with foam dressing or dry dressing.  Change daily    Re consult if needed, will not follow at this time. Thanks  Hung Rhinesmith [employer], RN,CWOCN, CNS, [employer] [phone]

## 2024-08-10 NOTE — Progress Notes (Signed)
 Pt presents flat and depressed, denies SI/HI/AVH. Pt observed by this clinical research associate interacting appropriately with staff and peers on the unit. Pt compliant with medication administration per MD orders. Pt given education, support, and encouragement to be active in her treatment plan. Pt being monitored Q 15 minutes for safety per unit protocol, remains safe on the unit

## 2024-08-10 NOTE — Plan of Care (Signed)
   Problem: Education: Goal: Emotional status will improve Outcome: Progressing

## 2024-08-10 NOTE — Progress Notes (Signed)
" °   08/10/24 1357  Wound 07/30/24 2000 Pressure Injury Sacrum Bilateral Stage 1 -  Intact skin with non-blanchable redness of a localized area usually over a bony prominence.  Date First Assessed/Time First Assessed: 07/30/24 2000   Present on Original Admission: Yes  Primary Wound Type: Pressure Injury  Location: Sacrum  Location Orientation: Bilateral  Staging: Stage 1 -  Intact skin with non-blanchable redness of a local...  Wound Image   Site / Wound Assessment Pink;Dry;Red  Peri-wound Assessment Erythema (non-blanchable)  Treatment Cleansed  Dressing Type Foam - Lift dressing to assess site every shift;Non adherent (xeroform)  Dressing Changed New  Dressing Status Clean, Dry, Intact   Placed wound care consult  "

## 2024-08-10 NOTE — Progress Notes (Signed)
 [facility] MD Progress Note  08/10/2024 11:33 AM Andrea Sanford  MRN:  [id number]   Subjective:  Chart reviewed, case discussed in multidisciplinary meeting, patient seen during rounds.   Patient seen on rounds today by psychiatry.  Patient continues to be fixated and anxious to discharge.  Patient did express that she got into contact with [facility] and provided number for this provider to call.  This provider reached out and was given the number [phone] for social work to be able to get into contact with their scheduling office to help arrange follow-up care.  This provider messaged social work with the following information.  Patient continued to deny suicidal or homicidal ideations as well as auditory or visual hallucinations.  As stated previously mother voiced concern about patient's current status with her previous history of intentional overdoses often requiring medical hospitalization.  Patient did endorse history of substance use, which she was able to tell this provider today did not help her mental health.  Patient reported tolerating current medication well and denied any noted side effects.  Patient had previously been prescribed Effexor  in the past and tolerated well.  Patient did express continued anxiety and depression, primarily related to life stressors including strained relationship with family and homelessness.    Past Psychiatric History: see h&P Family History:  Family History  Problem Relation Age of Onset   Depression Mother    Diabetes Mother    Cancer Father    Social History:  Social History   Substance and Sexual Activity  Alcohol Use Not Currently   Alcohol/week: 2.0 standard drinks of alcohol   Types: 2 Standard drinks or equivalent per week     Social History   Substance and Sexual Activity  Drug Use Yes   Types: Cocaine   Comment: uses on occasions    Social History   Socioeconomic History   Marital status: Divorced    Spouse name: Not on  file   Number of children: Not on file   Years of education: Not on file   Highest education level: Not on file  Occupational History   Not on file  Tobacco Use   Smoking status: Every Day    Current packs/day: 1.00    Types: Cigarettes    Passive exposure: Current   Smokeless tobacco: Never  Vaping Use   Vaping status: Some Days  Substance and Sexual Activity   Alcohol use: Not Currently    Alcohol/week: 2.0 standard drinks of alcohol    Types: 2 Standard drinks or equivalent per week   Drug use: Yes    Types: Cocaine    Comment: uses on occasions   Sexual activity: Yes  Other Topics Concern   Not on file  Social History Narrative   Based on results of PHQ 9 and GAD 7 recommendation that patient follow up within in one week for an assessment with Powell Pesa LCSW.    Social Drivers of Health   Tobacco Use: High Risk (08/08/2024)   Patient History    Smoking Tobacco Use: Every Day    Smokeless Tobacco Use: Never    Passive Exposure: Current  Financial Resource Strain: Not on file  Food Insecurity: Food Insecurity Present (08/08/2024)   Epic    Worried About Programme Researcher, Broadcasting/film/video in the Last Year: Sometimes true    [employer] of Food in the Last Year: Sometimes true  Transportation Needs: Unmet Transportation Needs (08/08/2024)   Epic    Lack of Transportation (  Medical): Yes    Lack of Transportation (Non-Medical): Yes  Physical Activity: Not on file  Stress: Not on file  Social Connections: Not on file  Depression (PHQ2-9): High Risk (05/21/2024)   Depression (PHQ2-9)    PHQ-2 Score: 21  Alcohol Screen: Low Risk (08/08/2024)   Alcohol Screen    Last Alcohol Screening Score (AUDIT): 2  Housing: High Risk (08/08/2024)   Epic    Unable to Pay for Housing in the Last Year: Yes    Number of Times Moved in the Last Year: 0    Homeless in the Last Year: Yes  Utilities: Not At Risk (08/08/2024)   Epic    Threatened with loss of utilities: No  Health Literacy: Not on file    Past Medical History:  Past Medical History:  Diagnosis Date   Allergy    Anxiety    Arthritis    Asthma    Bipolar 1 disorder (HCC)    Cancer (HCC)    COPD (chronic obstructive pulmonary disease) (HCC)    Depression    Diabetes mellitus without complication (HCC)    Drug abuse (HCC)    Hypertension    Neuromuscular disorder (HCC)    Osteoporosis    PTSD (post-traumatic stress disorder) 04/07/2024   Sleep apnea     Past Surgical History:  Procedure Laterality Date   ANKLE SURGERY Right    IR GASTROSTOMY TUBE MOD SED  09/04/2023   IR GASTROSTOMY TUBE REMOVAL  12/13/2023   IR PATIENT EVAL TECH 0-60 MINS  11/03/2023   PERIPHERAL VASCULAR THROMBECTOMY Left 1991   TRACHEOSTOMY TUBE PLACEMENT N/A 09/01/2023   Procedure: TRACHEOSTOMY;  Surgeon: Blair Mt, MD;  Location: [facility] ORS;  Service: ENT;  Laterality: N/A;    Current Medications: Current Facility-Administered Medications  Medication Dose Route Frequency Provider Last Rate Last Admin   acetaminophen  (TYLENOL ) tablet 650 mg  650 mg Oral Q6H PRN Jadapalle, Sree, MD   650 mg at 08/10/24 0634   alum & mag hydroxide-simeth (MAALOX/MYLANTA) 200-200-20 MG/5ML suspension 30 mL  30 mL Oral Q4H PRN Donnelly Mellow, MD       atorvastatin  (LIPITOR) tablet 40 mg  40 mg Oral Daily Jadapalle, Sree, MD   40 mg at 08/10/24 9188   diphenhydrAMINE  (BENADRYL ) capsule 25 mg  25 mg Oral Q6H PRN Jadapalle, Sree, MD       haloperidol  (HALDOL ) tablet 5 mg  5 mg Oral TID PRN Jadapalle, Sree, MD       And   diphenhydrAMINE  (BENADRYL ) capsule 50 mg  50 mg Oral TID PRN Jadapalle, Sree, MD       haloperidol  lactate (HALDOL ) injection 5 mg  5 mg Intramuscular TID PRN Jadapalle, Sree, MD       And   diphenhydrAMINE  (BENADRYL ) injection 50 mg  50 mg Intramuscular TID PRN Jadapalle, Sree, MD       And   LORazepam  (ATIVAN ) injection 2 mg  2 mg Intramuscular TID PRN Jadapalle, Sree, MD       haloperidol  lactate (HALDOL ) injection 10 mg  10 mg Intramuscular  TID PRN Donnelly Mellow, MD       And   diphenhydrAMINE  (BENADRYL ) injection 50 mg  50 mg Intramuscular TID PRN Jadapalle, Sree, MD       And   LORazepam  (ATIVAN ) injection 2 mg  2 mg Intramuscular TID PRN Jadapalle, Sree, MD       gabapentin  (NEURONTIN ) capsule 600 mg  600 mg Oral TID Jadapalle, Sree, MD  600 mg at 08/10/24 0810   hydrALAZINE  (APRESOLINE ) injection 10-20 mg  10-20 mg Intravenous Q6H PRN Donnelly Mellow, MD       hydrOXYzine  (ATARAX ) tablet 25 mg  25 mg Oral TID PRN Jadapalle, Sree, MD   25 mg at 08/09/24 1811   ipratropium-albuterol  (DUONEB) 0.5-2.5 (3) MG/3ML nebulizer solution 3 mL  3 mL Nebulization Q4H PRN Donnelly Mellow, MD       nicotine  (NICODERM CQ  - dosed in mg/24 hours) patch 21 mg  21 mg Transdermal Daily Jadapalle, Sree, MD   21 mg at 08/09/24 9157   nicotine  polacrilex (NICORETTE ) gum 2 mg  2 mg Oral PRN Jadapalle, Sree, MD       oxyCODONE  (Oxy IR/ROXICODONE ) immediate release tablet 5 mg  5 mg Oral Q6H PRN Jadapalle, Sree, MD   5 mg at 08/09/24 1810   QUEtiapine  (SEROQUEL ) tablet 400 mg  400 mg Oral QHS Jadapalle, Sree, MD   400 mg at 08/09/24 2115   traZODone  (DESYREL ) tablet 50 mg  50 mg Oral QHS PRN Jadapalle, Sree, MD       venlafaxine  XR (EFFEXOR -XR) 24 hr capsule 37.5 mg  37.5 mg Oral Q breakfast Jadapalle, Sree, MD   37.5 mg at 08/10/24 9188    Lab Results:  Results for orders placed or performed during the hospital encounter of 08/08/24 (from the past 48 hours)  Glucose, capillary     Status: Abnormal   Collection Time: 08/09/24  7:32 AM  Result Value Ref Range   Glucose-Capillary 169 (H) 70 - 99 mg/dL    Comment: Glucose reference range applies only to samples taken after fasting for at least 8 hours.  Hemoglobin A1c     Status: Abnormal   Collection Time: 08/09/24  9:28 AM  Result Value Ref Range   Hgb A1c MFr Bld 6.2 (H) 4.8 - 5.6 %    Comment: (NOTE) Diagnosis of Diabetes The following HbA1c ranges recommended by the American Diabetes  Association (ADA) may be used as an aid in the diagnosis of diabetes mellitus.  Hemoglobin             Suggested A1C NGSP%              Diagnosis  <5.7                   Non Diabetic  5.7-6.4                Pre-Diabetic  >6.4                   Diabetic  <7.0                   Glycemic control for                       adults with diabetes.     Mean Plasma Glucose 131.24 mg/dL    Comment: Performed at [facility], [address], [place], [place] [address]  Lipid panel     Status: Abnormal   Collection Time: 08/09/24  9:28 AM  Result Value Ref Range   Cholesterol 184 0 - 200 mg/dL    Comment:        ATP III CLASSIFICATION:  <200     mg/dL   Desirable  799-760  mg/dL   Borderline High  >=759    mg/dL   High  Triglycerides 161 (H) <150 mg/dL   HDL 30 (L) >59 mg/dL   Total CHOL/HDL Ratio 6.1 RATIO   VLDL 32 0 - 40 mg/dL   LDL Cholesterol 877 (H) 0 - 99 mg/dL    Comment:        Total Cholesterol/HDL:CHD Risk Coronary Heart Disease Risk Table                     Men   Women  1/2 Average Risk   3.4   3.3  Average Risk       5.0   4.4  2 X Average Risk   9.6   7.1  3 X Average Risk  23.4   11.0        Use the calculated Patient Ratio above and the CHD Risk Table to determine the patient's CHD Risk.        ATP III CLASSIFICATION (LDL):  <100     mg/dL   Optimal  899-870  mg/dL   Near or Above                    Optimal  130-159  mg/dL   Borderline  839-810  mg/dL   High  >809     mg/dL   Very High Performed at [facility], [address] [place], [place] [address]     Blood Alcohol level:  Lab Results  Component Value Date   [facility] <15 04/06/2024   ETH <15 02/11/2024    Metabolic Disorder Labs: Lab Results  Component Value Date   HGBA1C 6.2 (H) 08/09/2024   MPG 131.24 08/09/2024   MPG 148.46 09/14/2023   No results found for: PROLACTIN Lab Results  Component Value Date   CHOL 184 08/09/2024   TRIG 161 (H) 08/09/2024   HDL  30 (L) 08/09/2024   CHOLHDL 6.1 08/09/2024   VLDL 32 08/09/2024   LDLCALC 122 (H) 08/09/2024   LDLCALC 28 04/08/2024    Physical Findings: AIMS:  , ,  ,  ,    CIWA:    COWS:      Psychiatric Specialty Exam:  Presentation  General Appearance:  Appropriate for Environment  Eye Contact: Fair  Speech: Clear and Coherent  Speech Volume: Normal    Mood and Affect  Mood: Anxious  Affect: Congruent   Thought Process  Thought Processes: Coherent; Linear  Orientation:Full (Time, Place and Person)  Thought Content:Logical  Hallucinations:Hallucinations: None  Ideas of Reference:None  Suicidal Thoughts:Suicidal Thoughts: No  Homicidal Thoughts:Homicidal Thoughts: No   Sensorium  Memory: Immediate Fair; Recent Fair; Remote Fair  Judgment: Fair  Insight: Shallow   Executive Functions  Concentration: Fair  Attention Span: Fair  Recall: Fair  Fund of Knowledge: Fair  Language: Fair   Psychomotor Activity  Psychomotor Activity: Psychomotor Activity: Normal  Musculoskeletal: Strength & Muscle Tone: within normal limits Gait & Station: Using walker Assets  Assets: Communication Skills; Desire for Improvement; Social Support    Physical Exam: Physical Exam Neurological:     Mental Status: She is alert and oriented to person, place, and time.    Review of Systems  Respiratory:  Negative for shortness of breath.   Cardiovascular:  Negative for chest pain.  Neurological:  Negative for dizziness and headaches.  Psychiatric/Behavioral:  Positive for depression and substance abuse. Negative for suicidal ideas. The patient is nervous/anxious.    Blood pressure 129/86, pulse 78, temperature 97.7 F (36.5 C), temperature source Oral, resp. rate 18,  height 5' 7 (1.702 m), weight 79.9 kg, SpO2 100%. Body mass index is 27.6 kg/m.  Diagnosis: Principal Problem:   MDD (major depressive disorder), recurrent severe, without psychosis  (HCC)   PLAN: Safety and Monitoring:  -- Voluntary admission to inpatient psychiatric unit for safety, stabilization and treatment  -- Daily contact with patient to assess and evaluate symptoms and progress in treatment  -- Patient's case to be discussed in multi-disciplinary team meeting  -- Observation Level : q15 minute checks  -- Vital signs:  q12 hours  -- Precautions: suicide, elopement, and assault -- Encouraged patient to participate in unit milieu and in scheduled group therapies  2. Psychiatric Treatment:  Scheduled Medications: -Increased effexor  to 75 mg daily  -continue Seroquel  400 mg daily at bedtime -Nicotine  replacement therapy ordered -Hydroxyzine  25 mg 3 times daily as needed for anxiety    -- The risks/benefits/side-effects/alternatives to this medication were discussed in detail with the patient and time was given for questions. The patient consents to medication trial.  3. Medical Issues Being Addressed:  -No acute concerns reported, continuing current home prescribed medications   4. Discharge Planning:   -- Social work and case management to assist with discharge planning and identification of hospital follow-up needs prior to discharge  -- Estimated LOS: 3-4 days  [name], NP This note was created using [employer]. Please excuse any inadvertent transcription errors. Case was discussed with supervising physician Dr. Ruther who is agreeable with current plan.

## 2024-08-10 NOTE — Plan of Care (Signed)
 Andrea Sanford is a 54 y.o. female patient. No diagnosis found. Past Medical History:  Diagnosis Date   Allergy    Anxiety    Arthritis    Asthma    Bipolar 1 disorder (HCC)    Cancer (HCC)    COPD (chronic obstructive pulmonary disease) (HCC)    Depression    Diabetes mellitus without complication (HCC)    Drug abuse (HCC)    Hypertension    Neuromuscular disorder (HCC)    Osteoporosis    PTSD (post-traumatic stress disorder) 04/07/2024   Sleep apnea    Current Facility-Administered Medications  Medication Dose Route Frequency Provider Last Rate Last Admin   acetaminophen  (TYLENOL ) tablet 650 mg  650 mg Oral Q6H PRN Jadapalle, Sree, MD   650 mg at 08/08/24 1716   alum & mag hydroxide-simeth (MAALOX/MYLANTA) 200-200-20 MG/5ML suspension 30 mL  30 mL Oral Q4H PRN Jadapalle, Sree, MD       atorvastatin  (LIPITOR) tablet 40 mg  40 mg Oral Daily Jadapalle, Sree, MD   40 mg at 08/09/24 0840   diphenhydrAMINE  (BENADRYL ) capsule 25 mg  25 mg Oral Q6H PRN Jadapalle, Sree, MD       haloperidol  (HALDOL ) tablet 5 mg  5 mg Oral TID PRN Jadapalle, Sree, MD       And   diphenhydrAMINE  (BENADRYL ) capsule 50 mg  50 mg Oral TID PRN Jadapalle, Sree, MD       haloperidol  lactate (HALDOL ) injection 5 mg  5 mg Intramuscular TID PRN Jadapalle, Sree, MD       And   diphenhydrAMINE  (BENADRYL ) injection 50 mg  50 mg Intramuscular TID PRN Jadapalle, Sree, MD       And   LORazepam  (ATIVAN ) injection 2 mg  2 mg Intramuscular TID PRN Jadapalle, Sree, MD       haloperidol  lactate (HALDOL ) injection 10 mg  10 mg Intramuscular TID PRN Jadapalle, Sree, MD       And   diphenhydrAMINE  (BENADRYL ) injection 50 mg  50 mg Intramuscular TID PRN Jadapalle, Sree, MD       And   LORazepam  (ATIVAN ) injection 2 mg  2 mg Intramuscular TID PRN Jadapalle, Sree, MD       gabapentin  (NEURONTIN ) capsule 600 mg  600 mg Oral TID Jadapalle, Sree, MD   600 mg at 08/09/24 1620   hydrALAZINE  (APRESOLINE ) injection 10-20 mg  10-20  mg Intravenous Q6H PRN Donnelly Mellow, MD       hydrOXYzine  (ATARAX ) tablet 25 mg  25 mg Oral TID PRN Jadapalle, Sree, MD   25 mg at 08/09/24 1811   ipratropium-albuterol  (DUONEB) 0.5-2.5 (3) MG/3ML nebulizer solution 3 mL  3 mL Nebulization Q4H PRN Donnelly Mellow, MD       nicotine  (NICODERM CQ  - dosed in mg/24 hours) patch 21 mg  21 mg Transdermal Daily Jadapalle, Sree, MD   21 mg at 08/09/24 9157   nicotine  polacrilex (NICORETTE ) gum 2 mg  2 mg Oral PRN Jadapalle, Sree, MD       oxyCODONE  (Oxy IR/ROXICODONE ) immediate release tablet 5 mg  5 mg Oral Q6H PRN Jadapalle, Sree, MD   5 mg at 08/09/24 1810   QUEtiapine  (SEROQUEL ) tablet 400 mg  400 mg Oral QHS Jadapalle, Sree, MD   400 mg at 08/09/24 2115   traZODone  (DESYREL ) tablet 50 mg  50 mg Oral QHS PRN Jadapalle, Sree, MD       venlafaxine  XR (EFFEXOR -XR) 24 hr capsule 37.5 mg  37.5 mg  Oral Q breakfast Jadapalle, Sree, MD   37.5 mg at 08/09/24 0840   Allergies[1] Principal Problem:   MDD (major depressive disorder), recurrent severe, without psychosis (HCC)  Blood pressure 137/89, pulse 78, temperature 98.8 F (37.1 C), temperature source Oral, resp. rate 16, height 5' 7 (1.702 m), weight 79.9 kg, SpO2 100%.    Andrea Sanford 08/10/2024      [1] No Known Allergies
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
## 2024-08-10 NOTE — Progress Notes (Signed)
" °   08/10/24 0333  Psych Admission Type (Psych Patients Only)  Admission Status Involuntary  Psychosocial Assessment  Patient Complaints Anxiety  Eye Contact Brief  Facial Expression Flat;Worried  Affect Sad  Speech Logical/coherent  Interaction Assertive  Motor Activity Slow  Appearance/Hygiene In scrubs  Behavior Characteristics Cooperative  Mood Labile  Thought Process  Coherency WDL  Content WDL  Delusions None reported or observed  Perception WDL  Hallucination None reported or observed  Judgment Impaired  Confusion WDL  Danger to Self  Current suicidal ideation? Denies  Agreement Not to Harm Self Yes  Description of Agreement Verbal    "

## 2024-08-10 NOTE — [facility] Suicide Risk Assessment (Signed)
 [facility] INPATIENT:  Family/Significant Other Suicide Prevention Education  Suicide Prevention Education:  Education Completed; Andrea Sanford, mom [phone],   has been identified by the patient as the family member/significant other with whom the patient will be residing, and identified as the person(s) who will aid the patient in the event of a mental health crisis (suicidal ideations/suicide attempt).  With written consent from the patient, the family member/significant other has been provided the following suicide prevention education, prior to the and/or following the discharge of the patient.  The suicide prevention education provided includes the following: Suicide risk factors Suicide prevention and interventions National Suicide Hotline telephone number [facility] assessment telephone number [facility] Emergency Assistance 911 [facility] and/or Residential Mobile Crisis Unit telephone number  Request made of family/significant other to: Remove weapons (e.g., guns, rifles, knives), all items previously/currently identified as safety concern.   Remove drugs/medications (over-the-counter, prescriptions, illicit drugs), all items previously/currently identified as a safety concern.  The family member/significant other verbalizes understanding of the suicide prevention education information provided.  The family member/significant other agrees to remove the items of safety concern listed above.  Andrea Sanford 08/10/2024, 1:49 PM

## 2024-08-10 NOTE — [facility] Suicide Risk Assessment (Signed)
 The LCSWA contacted Roderick Cheese, mom [phone] to provide SPI.  The patient mom informed the LCSWA that the patient is able to stay with her until the 1st of the month when the patient receives her check.   Mom reported a history of suicide attempts and ideations.  Mom requested to be informed prior to the patient discharge of the date.   Roselyn Lento, MSW, LCSWA

## 2024-08-11 MED ORDER — INFLUENZA VIRUS VACC SPLIT PF (FLUZONE) 0.5 ML IM SUSY
0.5000 mL | PREFILLED_SYRINGE | INTRAMUSCULAR | Status: DC
  Filled 2024-08-11: qty 0.5

## 2024-08-11 NOTE — Group Note (Signed)
 [facility] LCSW Group Therapy Note   Group Date: 08/11/2024 Start Time: 1400 End Time: 1500   Type of Therapy/Topic:  Group Therapy:  Emotion Regulation  Participation Level:  Did Not Attend   Mood: Not able to assess.  Description of Group:    The purpose of this group is to assist patients in learning to regulate negative emotions and experience positive emotions. Patients will be guided to discuss ways in which they have been vulnerable to their negative emotions. These vulnerabilities will be juxtaposed with experiences of positive emotions or situations, and patients challenged to use positive emotions to combat negative ones. Special emphasis will be placed on coping with negative emotions in conflict situations, and patients will process healthy conflict resolution skills.  Therapeutic Goals: Patient will identify two positive emotions or experiences to reflect on in order to balance out negative emotions:  Patient will label two or more emotions that they find the most difficult to experience:  Patient will be able to demonstrate positive conflict resolution skills through discussion or role plays:   Summary of Patient Progress:  Pt did not attend group.      Therapeutic Modalities:   Cognitive Behavioral Therapy Feelings Identification Dialectical Behavioral Therapy   Rexene LELON Mae, LCSWA

## 2024-08-11 NOTE — Progress Notes (Addendum)
 [facility] MD Progress Note  08/11/2024 11:00 AM Andrea Sanford  MRN:  [id number]   Subjective:  Chart reviewed, case discussed in multidisciplinary meeting, patient seen during rounds.   Patient seen on rounds today by psychiatry.  Patient continues to be fixated and anxious to discharge.  Patient did express that she got into contact with [facility] and provided number for this provider to call.  This provider reached out and was given the number [phone] for social work to be able to get into contact with their scheduling office to help arrange follow-up care.  This provider messaged social work with the following information.  Patient continued to deny suicidal or homicidal ideations as well as auditory or visual hallucinations.  As stated previously mother voiced concern about patient's current status with her previous history of intentional overdoses often requiring medical hospitalization.  Patient did endorse history of substance use, which she was able to tell this provider today did not help her mental health.  Patient reported tolerating current medication well and denied any noted side effects.  Patient had previously been prescribed Effexor  in the past and tolerated well.  Patient did express continued anxiety and depression, primarily related to life stressors including strained relationship with family and homelessness.  08/11/2024: Patient remains fixated on discharging.  Patient reported that she has contacted her mother and her mother will allow her to return to her residence while she attempts to secure housing.  On exam today, patient denied suicidal, homicidal ideations as well as auditory or visual hallucinations.  Irritability still noted primarily related to desire for patient to leave.  We discussed extensively the need for continued monitoring due to medication changes that were made as well as ongoing collateral safety concerns, especially with patient's history of self-harm.   Patient reported I will never do anything like that again, I promise.  Patient reported sleeping okay and eating okay.  She reported that she has made a list of things to do when she is discharged including getting new eyeglasses through her insurance, as well as getting an appointment with her primary care doctor.  Patient still expressed that she was willing to establish with [facility] services for continued community management.  Our social work team was provided with the number that the [facility] team number provided for myself to be able to assist with scheduling patient for outpatient follow-up.  Patient reported doing well with current medications and denied any noted side effects.  Patient continued to give permission to psychiatry team to contact mother as needed for collateral information.    Past Psychiatric History: see h&P Family History:  Family History  Problem Relation Age of Onset   Depression Mother    Diabetes Mother    Cancer Father    Social History:  Social History   Substance and Sexual Activity  Alcohol Use Not Currently   Alcohol/week: 2.0 standard drinks of alcohol   Types: 2 Standard drinks or equivalent per week     Social History   Substance and Sexual Activity  Drug Use Yes   Types: Cocaine   Comment: uses on occasions    Social History   Socioeconomic History   Marital status: Divorced    Spouse name: Not on file   Number of children: Not on file   Years of education: Not on file   Highest education level: Not on file  Occupational History   Not on file  Tobacco Use   Smoking status: Every  Day    Current packs/day: 1.00    Types: Cigarettes    Passive exposure: Current   Smokeless tobacco: Never  Vaping Use   Vaping status: Some Days  Substance and Sexual Activity   Alcohol use: Not Currently    Alcohol/week: 2.0 standard drinks of alcohol    Types: 2 Standard drinks or equivalent per week   Drug use: Yes    Types: Cocaine     Comment: uses on occasions   Sexual activity: Yes  Other Topics Concern   Not on file  Social History Narrative   Based on results of PHQ 9 and GAD 7 recommendation that patient follow up within in one week for an assessment with Powell Pesa LCSW.    Social Drivers of Health   Tobacco Use: High Risk (08/08/2024)   Patient History    Smoking Tobacco Use: Every Day    Smokeless Tobacco Use: Never    Passive Exposure: Current  Financial Resource Strain: Not on file  Food Insecurity: Food Insecurity Present (08/08/2024)   Epic    Worried About Programme Researcher, Broadcasting/film/video in the Last Year: Sometimes true    Ran Out of Food in the Last Year: Sometimes true  Transportation Needs: Unmet Transportation Needs (08/08/2024)   Epic    Lack of Transportation (Medical): Yes    Lack of Transportation (Non-Medical): Yes  Physical Activity: Not on file  Stress: Not on file  Social Connections: Not on file  Depression (PHQ2-9): High Risk (05/21/2024)   Depression (PHQ2-9)    PHQ-2 Score: 21  Alcohol Screen: Low Risk (08/08/2024)   Alcohol Screen    Last Alcohol Screening Score (AUDIT): 2  Housing: High Risk (08/08/2024)   Epic    Unable to Pay for Housing in the Last Year: Yes    Number of Times Moved in the Last Year: 0    Homeless in the Last Year: Yes  Utilities: Not At Risk (08/08/2024)   Epic    Threatened with loss of utilities: No  Health Literacy: Not on file   Past Medical History:  Past Medical History:  Diagnosis Date   Allergy    Anxiety    Arthritis    Asthma    Bipolar 1 disorder (HCC)    Cancer (HCC)    COPD (chronic obstructive pulmonary disease) (HCC)    Depression    Diabetes mellitus without complication (HCC)    Drug abuse (HCC)    Hypertension    Neuromuscular disorder (HCC)    Osteoporosis    PTSD (post-traumatic stress disorder) 04/07/2024   Sleep apnea     Past Surgical History:  Procedure Laterality Date   ANKLE SURGERY Right    IR GASTROSTOMY TUBE MOD SED   09/04/2023   IR GASTROSTOMY TUBE REMOVAL  12/13/2023   IR PATIENT EVAL TECH 0-60 MINS  11/03/2023   PERIPHERAL VASCULAR THROMBECTOMY Left 1991   TRACHEOSTOMY TUBE PLACEMENT N/A 09/01/2023   Procedure: TRACHEOSTOMY;  Surgeon: Blair Mt, MD;  Location: [facility] ORS;  Service: ENT;  Laterality: N/A;    Current Medications: Current Facility-Administered Medications  Medication Dose Route Frequency Provider Last Rate Last Admin   acetaminophen  (TYLENOL ) tablet 650 mg  650 mg Oral Q6H PRN Jadapalle, Sree, MD   650 mg at 08/10/24 0634   alum & mag hydroxide-simeth (MAALOX/MYLANTA) 200-200-20 MG/5ML suspension 30 mL  30 mL Oral Q4H PRN Donnelly Mellow, MD       atorvastatin  (LIPITOR) tablet 40 mg  40 mg  Oral Daily Jadapalle, Sree, MD   40 mg at 08/11/24 0757   collagenase  (SANTYL ) ointment 1 Application  1 Application Topical Daily Jadapalle, Sree, MD   1 Application at 08/11/24 9094   diphenhydrAMINE  (BENADRYL ) capsule 25 mg  25 mg Oral Q6H PRN Donnelly Mellow, MD       haloperidol  (HALDOL ) tablet 5 mg  5 mg Oral TID PRN Jadapalle, Sree, MD       And   diphenhydrAMINE  (BENADRYL ) capsule 50 mg  50 mg Oral TID PRN Jadapalle, Sree, MD       haloperidol  lactate (HALDOL ) injection 5 mg  5 mg Intramuscular TID PRN Donnelly Mellow, MD       And   diphenhydrAMINE  (BENADRYL ) injection 50 mg  50 mg Intramuscular TID PRN Jadapalle, Sree, MD       And   LORazepam  (ATIVAN ) injection 2 mg  2 mg Intramuscular TID PRN Jadapalle, Sree, MD       haloperidol  lactate (HALDOL ) injection 10 mg  10 mg Intramuscular TID PRN Jadapalle, Sree, MD       And   diphenhydrAMINE  (BENADRYL ) injection 50 mg  50 mg Intramuscular TID PRN Jadapalle, Sree, MD       And   LORazepam  (ATIVAN ) injection 2 mg  2 mg Intramuscular TID PRN Jadapalle, Sree, MD       gabapentin  (NEURONTIN ) capsule 600 mg  600 mg Oral TID Jadapalle, Sree, MD   600 mg at 08/11/24 0757   hydrALAZINE  (APRESOLINE ) injection 10-20 mg  10-20 mg Intravenous Q6H PRN  Donnelly Mellow, MD       hydrOXYzine  (ATARAX ) tablet 25 mg  25 mg Oral TID PRN Jadapalle, Sree, MD   25 mg at 08/09/24 1811   ipratropium-albuterol  (DUONEB) 0.5-2.5 (3) MG/3ML nebulizer solution 3 mL  3 mL Nebulization Q4H PRN Donnelly Mellow, MD       nicotine  (NICODERM CQ  - dosed in mg/24 hours) patch 21 mg  21 mg Transdermal Daily Jadapalle, Sree, MD   21 mg at 08/09/24 9157   nicotine  polacrilex (NICORETTE ) gum 2 mg  2 mg Oral PRN Jadapalle, Sree, MD   2 mg at 08/10/24 1243   oxyCODONE  (Oxy IR/ROXICODONE ) immediate release tablet 5 mg  5 mg Oral Q6H PRN Jadapalle, Sree, MD   5 mg at 08/11/24 0757   QUEtiapine  (SEROQUEL ) tablet 400 mg  400 mg Oral QHS Jadapalle, Sree, MD   400 mg at 08/10/24 2109   traZODone  (DESYREL ) tablet 50 mg  50 mg Oral QHS PRN Jadapalle, Sree, MD   50 mg at 08/10/24 2109   venlafaxine  XR (EFFEXOR -XR) 24 hr capsule 75 mg  75 mg Oral Q breakfast Aryana Wonnacott B, NP   75 mg at 08/11/24 0757    Lab Results:  No results found for this or any previous visit (from the past 48 hours).   Blood Alcohol level:  Lab Results  Component Value Date   [facility] <15 04/06/2024   ETH <15 02/11/2024    Metabolic Disorder Labs: Lab Results  Component Value Date   HGBA1C 6.2 (H) 08/09/2024   MPG 131.24 08/09/2024   MPG 148.46 09/14/2023   No results found for: PROLACTIN Lab Results  Component Value Date   CHOL 184 08/09/2024   TRIG 161 (H) 08/09/2024   HDL 30 (L) 08/09/2024   CHOLHDL 6.1 08/09/2024   VLDL 32 08/09/2024   LDLCALC 122 (H) 08/09/2024   LDLCALC 28 04/08/2024    Physical Findings: AIMS:  , ,  ,  ,  CIWA:    COWS:      Psychiatric Specialty Exam:  Presentation  General Appearance:  Appropriate for Environment  Eye Contact: Fair  Speech: Clear and Coherent  Speech Volume: Normal    Mood and Affect  Mood: Anxious  Affect: Congruent   Thought Process  Thought Processes: Coherent; Linear  Orientation:Full (Time, Place and  Person)  Thought Content:Logical  Hallucinations:No data recorded  Ideas of Reference:None  Suicidal Thoughts:No data recorded  Homicidal Thoughts:No data recorded   Sensorium  Memory: Immediate Fair; Recent Fair; Remote Fair  Judgment: Fair  Insight: Shallow   Executive Functions  Concentration: Fair  Attention Span: Fair  Recall: Fair  Fund of Knowledge: Fair  Language: Fair   Psychomotor Activity  Psychomotor Activity: No data recorded  Musculoskeletal: Strength & Muscle Tone: within normal limits Gait & Station: Using walker Assets  Assets: Communication Skills; Desire for Improvement; Social Support    Physical Exam: Physical Exam Neurological:     Mental Status: She is alert and oriented to person, place, and time.    Review of Systems  Respiratory:  Negative for shortness of breath.   Cardiovascular:  Negative for chest pain.  Neurological:  Negative for dizziness and headaches.  Psychiatric/Behavioral:  Positive for depression and substance abuse. Negative for suicidal ideas. The patient is nervous/anxious.    Blood pressure 139/79, pulse 85, temperature 97.7 F (36.5 C), temperature source Oral, resp. rate 18, height 5' 7 (1.702 m), weight 79.9 kg, SpO2 100%. Body mass index is 27.6 kg/m.  Diagnosis: Principal Problem:   MDD (major depressive disorder), recurrent severe, without psychosis (HCC)   PLAN: Safety and Monitoring:  -- Voluntary admission to inpatient psychiatric unit for safety, stabilization and treatment  -- Daily contact with patient to assess and evaluate symptoms and progress in treatment  -- Patient's case to be discussed in multi-disciplinary team meeting  -- Observation Level : q15 minute checks  -- Vital signs:  q12 hours  -- Precautions: suicide, elopement, and assault -- Encouraged patient to participate in unit milieu and in scheduled group therapies  2. Psychiatric Treatment:  Scheduled  Medications: -Continue Effexor  to 75 mg daily  -continue Seroquel  400 mg daily at bedtime -Nicotine  replacement therapy ordered -Hydroxyzine  25 mg 3 times daily as needed for anxiety    -- The risks/benefits/side-effects/alternatives to this medication were discussed in detail with the patient and time was given for questions. The patient consents to medication trial.  3. Medical Issues Being Addressed:   continuing current home prescribed medications -Patient was agreeable to wound care consult- see orders for detailed recommendations   4. Discharge Planning:   -- Social work and case management to assist with discharge planning and identification of hospital follow-up needs prior to discharge  -- Estimated LOS: 3-4 days  [name], NP This note was created using [employer]. Please excuse any inadvertent transcription errors. Case was discussed with supervising physician Dr. Ruther who is agreeable with current plan.

## 2024-08-11 NOTE — Plan of Care (Signed)
   Problem: Education: Goal: Emotional status will improve Outcome: Progressing Goal: Mental status will improve Outcome: Progressing

## 2024-08-11 NOTE — Progress Notes (Signed)
" °   08/11/24 0921  Psych Admission Type (Psych Patients Only)  Admission Status Involuntary  Psychosocial Assessment  Patient Complaints None  Eye Contact Fair  Facial Expression Flat  Affect Flat  Speech Logical/coherent  Interaction Assertive  Motor Activity Slow  Appearance/Hygiene Unremarkable  Behavior Characteristics Cooperative  Mood Pleasant  Aggressive Behavior  Effect No apparent injury  Thought Process  Coherency WDL  Content WDL  Delusions None reported or observed  Perception WDL  Hallucination None reported or observed  Judgment Impaired  Confusion WDL  Danger to Self  Current suicidal ideation? Denies  Agreement Not to Harm Self Yes  Description of Agreement verbal  Danger to Others  Danger to Others None reported or observed  Danger to Others Abnormal  Harmful Behavior to others No threats or harm toward other people  Destructive Behavior No threats or harm toward property    "

## 2024-08-11 NOTE — Plan of Care (Signed)

## 2024-08-11 NOTE — Group Note (Signed)
 Date:  08/11/2024 Time:  9:16 PM  Group Topic/Focus:  Wrap-Up Group:   The focus of this group is to help patients review their daily goal of treatment and discuss progress on daily workbooks.    Participation Level:  Active  Participation Quality:  Appropriate and Attentive  Affect:  Appropriate  Cognitive:  Appropriate  Insight: Appropriate  Engagement in Group:  Engaged  Modes of Intervention:  Support  Additional Comments:     Kerri Katz 08/11/2024, 9:16 PM

## 2024-08-11 NOTE — Group Note (Signed)
 Date:  08/11/2024 Time:  11:14 AM  Group Topic/Focus:  Goals Group:   The focus of this group is to help patients establish daily goals to achieve during treatment and discuss how the patient can incorporate goal setting into their daily lives to aide in recovery.    Participation Level:  Did Not Attend   Cecilia Vancleve L Cedrik Heindl 08/11/2024, 11:14 AM

## 2024-08-12 ENCOUNTER — Telehealth: Payer: Self-pay | Admitting: Family Medicine

## 2024-08-12 ENCOUNTER — Encounter: Payer: Self-pay | Admitting: Family Medicine

## 2024-08-12 NOTE — Telephone Encounter (Signed)
 Copied from CRM [id number]. Topic: Clinical - Medical Advice >> Aug 12, 2024  7:59 AM Tiffini S wrote: Reason for CRM: Patient is in the hospital and asked for a call back to discuss the rollator walker information- please call the patient back at [phone] and [phone]

## 2024-08-12 NOTE — Progress Notes (Signed)
 Pt presents flat and depressed, denies SI/HI/AVH. Pt observed by this clinical research associate interacting appropriately with staff and peers on the unit. Pt compliant with medication administration per MD orders. Pt given education, support, and encouragement to be active in her treatment plan. Pt being monitored Q 15 minutes for safety per unit protocol, remains safe on the unit

## 2024-08-12 NOTE — Telephone Encounter (Signed)
Please refer to telephone encounter from 11/5

## 2024-08-12 NOTE — Group Note (Signed)
 Recreation Therapy Group Note   Group Topic:Coping Skills  Group Date: 08/12/2024 Start Time: 1530 End Time: 1615 Facilitators: Celestia Jeoffrey FORBES ARTICE, CTRS Location: Craft Room  Group Description: Coping A-Z. LRT and patients engage in a guided discussion on what coping skills are and gave specific examples. LRT passed out a handout labeled Coping A-Z with blank spaces beside each letter. LRT prompted patients to come up with a coping skill for each of the letters. LRT and patients went over the handout and gave ideas for each letter if anyone had any blanks left on their paper. Patients kept this handout with them that listed 26 different coping skills.   Goal Area(s) Addressed: Patients will be able to define coping skills. Patient will identify new coping skills.  Patient will increase communication.   Affect/Mood: N/A   Participation Level: Did not attend    Clinical Observations/Individualized Feedback: Patient did not attend.  Plan: Continue to engage patient in RT group sessions 2-3x/week.   Jeoffrey FORBES Celestia, LRT, CTRS 08/12/2024 5:27 PM

## 2024-08-12 NOTE — Group Note (Signed)
 [facility] LCSW Group Therapy Note   Group Date: 08/12/2024 Start Time: 1245 End Time: 1357   Type of Therapy/Topic:  Group Therapy:  Emotion Regulation  Participation Level:  Did Not Attend   Mood:  Description of Group:    The purpose of this group is to assist patients in learning to regulate negative emotions and experience positive emotions. Patients will be guided to discuss ways in which they have been vulnerable to their negative emotions. These vulnerabilities will be juxtaposed with experiences of positive emotions or situations, and patients challenged to use positive emotions to combat negative ones. Special emphasis will be placed on coping with negative emotions in conflict situations, and patients will process healthy conflict resolution skills.  Therapeutic Goals: Patient will identify two positive emotions or experiences to reflect on in order to balance out negative emotions:  Patient will label two or more emotions that they find the most difficult to experience:  Patient will be able to demonstrate positive conflict resolution skills through discussion or role plays:   Summary of Patient Progress:   Patient did not attend group.     Therapeutic Modalities:   Cognitive Behavioral Therapy Feelings Identification Dialectical Behavioral Therapy   Alveta CHRISTELLA Kerns, LCSW

## 2024-08-12 NOTE — Progress Notes (Signed)
 [facility] MD Progress Note  08/12/2024 10:57 AM Andrea Sanford  MRN:  [id number]   Subjective:  Chart reviewed, case discussed in multidisciplinary meeting, patient seen during rounds.   Patient seen on rounds today by psychiatry.  Patient continues to be fixated and anxious to discharge.  Patient did express that she got into contact with [facility] and provided number for this provider to call.  This provider reached out and was given the number [phone] for social work to be able to get into contact with their scheduling office to help arrange follow-up care.  This provider messaged social work with the following information.  Patient continued to deny suicidal or homicidal ideations as well as auditory or visual hallucinations.  As stated previously mother voiced concern about patient's current status with her previous history of intentional overdoses often requiring medical hospitalization.  Patient did endorse history of substance use, which she was able to tell this provider today did not help her mental health.  Patient reported tolerating current medication well and denied any noted side effects.  Patient had previously been prescribed Effexor  in the past and tolerated well.  Patient did express continued anxiety and depression, primarily related to life stressors including strained relationship with family and homelessness.  08/11/2024: Patient remains fixated on discharging.  Patient reported that she has contacted her mother and her mother will allow her to return to her residence while she attempts to secure housing.  On exam today, patient denied suicidal, homicidal ideations as well as auditory or visual hallucinations.  Irritability still noted primarily related to desire for patient to leave.  We discussed extensively the need for continued monitoring due to medication changes that were made as well as ongoing collateral safety concerns, especially with patient's history of self-harm.   Patient reported I will never do anything like that again, I promise.  Patient reported sleeping okay and eating okay.  She reported that she has made a list of things to do when she is discharged including getting new eyeglasses through her insurance, as well as getting an appointment with her primary care doctor.  Patient still expressed that she was willing to establish with [facility] services for continued community management.  Our social work team was provided with the number that the [facility] team number provided for myself to be able to assist with scheduling patient for outpatient follow-up.  Patient reported doing well with current medications and denied any noted side effects.  Patient continued to give permission to psychiatry team to contact mother as needed for collateral information.  08/12/2024: Patient seen on rounds today by psychiatry and still remains discharge focused.  Patient continues to be willing to be established with [facility], will plan to follow-up with social work to see progress on establishing outpatient follow-up.  Patient continues to deny suicidal or homicidal ideations as well as auditory or visual hallucinations.  Patient did endorse some irritability, but reported she believes this is primarily related to being here in the inpatient unit.  Patient endorsed better sleep with trazodone  and endorsed good appetite.  This provider contacted patient's mother with patient permission today.  Mother reported that patient is allowed to return to her residence only until the first of next month as she cannot handle patient for long-term.  She reported she would allow patient to stay there to give her enough time to apply to the housing program that she was interested in.  Mother did report that patient has  some noted mood lability, but feels like patient has improved.  Mother denied current concerns that patient was a risk to self or others.  Objectively, on today's  exam there was no evidence of psychosis, mania, or patient responding to internal stimuli.    Past Psychiatric History: see h&P Family History:  Family History  Problem Relation Age of Onset   Depression Mother    Diabetes Mother    Cancer Father    Social History:  Social History   Substance and Sexual Activity  Alcohol Use Not Currently   Alcohol/week: 2.0 standard drinks of alcohol   Types: 2 Standard drinks or equivalent per week     Social History   Substance and Sexual Activity  Drug Use Yes   Types: Cocaine   Comment: uses on occasions    Social History   Socioeconomic History   Marital status: Divorced    Spouse name: Not on file   Number of children: Not on file   Years of education: Not on file   Highest education level: Not on file  Occupational History   Not on file  Tobacco Use   Smoking status: Every Day    Current packs/day: 1.00    Types: Cigarettes    Passive exposure: Current   Smokeless tobacco: Never  Vaping Use   Vaping status: Some Days  Substance and Sexual Activity   Alcohol use: Not Currently    Alcohol/week: 2.0 standard drinks of alcohol    Types: 2 Standard drinks or equivalent per week   Drug use: Yes    Types: Cocaine    Comment: uses on occasions   Sexual activity: Yes  Other Topics Concern   Not on file  Social History Narrative   Based on results of PHQ 9 and GAD 7 recommendation that patient follow up within in one week for an assessment with Powell Pesa LCSW.    Social Drivers of Health   Tobacco Use: High Risk (08/08/2024)   Patient History    Smoking Tobacco Use: Every Day    Smokeless Tobacco Use: Never    Passive Exposure: Current  Financial Resource Strain: Not on file  Food Insecurity: Food Insecurity Present (08/08/2024)   Epic    Worried About Programme Researcher, Broadcasting/film/video in the Last Year: Sometimes true    Ran Out of Food in the Last Year: Sometimes true  Transportation Needs: Unmet Transportation Needs  (08/08/2024)   Epic    Lack of Transportation (Medical): Yes    Lack of Transportation (Non-Medical): Yes  Physical Activity: Not on file  Stress: Not on file  Social Connections: Not on file  Depression (PHQ2-9): High Risk (05/21/2024)   Depression (PHQ2-9)    PHQ-2 Score: 21  Alcohol Screen: Low Risk (08/08/2024)   Alcohol Screen    Last Alcohol Screening Score (AUDIT): 2  Housing: High Risk (08/08/2024)   Epic    Unable to Pay for Housing in the Last Year: Yes    Number of Times Moved in the Last Year: 0    Homeless in the Last Year: Yes  Utilities: Not At Risk (08/08/2024)   Epic    Threatened with loss of utilities: No  Health Literacy: Not on file   Past Medical History:  Past Medical History:  Diagnosis Date   Allergy    Anxiety    Arthritis    Asthma    Bipolar 1 disorder (HCC)    Cancer (HCC)    COPD (chronic obstructive  pulmonary disease) (HCC)    Depression    Diabetes mellitus without complication (HCC)    Drug abuse (HCC)    Hypertension    Neuromuscular disorder (HCC)    Osteoporosis    PTSD (post-traumatic stress disorder) 04/07/2024   Sleep apnea     Past Surgical History:  Procedure Laterality Date   ANKLE SURGERY Right    IR GASTROSTOMY TUBE MOD SED  09/04/2023   IR GASTROSTOMY TUBE REMOVAL  12/13/2023   IR PATIENT EVAL TECH 0-60 MINS  11/03/2023   PERIPHERAL VASCULAR THROMBECTOMY Left 1991   TRACHEOSTOMY TUBE PLACEMENT N/A 09/01/2023   Procedure: TRACHEOSTOMY;  Surgeon: Blair Mt, MD;  Location: [facility] ORS;  Service: ENT;  Laterality: N/A;    Current Medications: Current Facility-Administered Medications  Medication Dose Route Frequency Provider Last Rate Last Admin   acetaminophen  (TYLENOL ) tablet 650 mg  650 mg Oral Q6H PRN Jadapalle, Sree, MD   650 mg at 08/11/24 2013   alum & mag hydroxide-simeth (MAALOX/MYLANTA) 200-200-20 MG/5ML suspension 30 mL  30 mL Oral Q4H PRN Jadapalle, Sree, MD       atorvastatin  (LIPITOR) tablet 40 mg  40 mg Oral  Daily Jadapalle, Sree, MD   40 mg at 08/12/24 9141   collagenase  (SANTYL ) ointment 1 Application  1 Application Topical Daily Jadapalle, Sree, MD   1 Application at 08/12/24 0859   diphenhydrAMINE  (BENADRYL ) capsule 25 mg  25 mg Oral Q6H PRN Jadapalle, Sree, MD       haloperidol  (HALDOL ) tablet 5 mg  5 mg Oral TID PRN Jadapalle, Sree, MD       And   diphenhydrAMINE  (BENADRYL ) capsule 50 mg  50 mg Oral TID PRN Jadapalle, Sree, MD       haloperidol  lactate (HALDOL ) injection 5 mg  5 mg Intramuscular TID PRN Jadapalle, Sree, MD       And   diphenhydrAMINE  (BENADRYL ) injection 50 mg  50 mg Intramuscular TID PRN Jadapalle, Sree, MD       And   LORazepam  (ATIVAN ) injection 2 mg  2 mg Intramuscular TID PRN Jadapalle, Sree, MD       haloperidol  lactate (HALDOL ) injection 10 mg  10 mg Intramuscular TID PRN Jadapalle, Sree, MD       And   diphenhydrAMINE  (BENADRYL ) injection 50 mg  50 mg Intramuscular TID PRN Jadapalle, Sree, MD       And   LORazepam  (ATIVAN ) injection 2 mg  2 mg Intramuscular TID PRN Jadapalle, Sree, MD       gabapentin  (NEURONTIN ) capsule 600 mg  600 mg Oral TID Jadapalle, Sree, MD   600 mg at 08/12/24 9141   hydrALAZINE  (APRESOLINE ) injection 10-20 mg  10-20 mg Intravenous Q6H PRN Donnelly Mellow, MD       hydrOXYzine  (ATARAX ) tablet 25 mg  25 mg Oral TID PRN Jadapalle, Sree, MD   25 mg at 08/11/24 1633   influenza vac split trivalent PF (FLUZONE ) injection 0.5 mL  0.5 mL Intramuscular Tomorrow-1000 Donnelly Mellow, MD       ipratropium-albuterol  (DUONEB) 0.5-2.5 (3) MG/3ML nebulizer solution 3 mL  3 mL Nebulization Q4H PRN Donnelly Mellow, MD       nicotine  (NICODERM CQ  - dosed in mg/24 hours) patch 21 mg  21 mg Transdermal Daily Jadapalle, Sree, MD   21 mg at 08/09/24 9157   nicotine  polacrilex (NICORETTE ) gum 2 mg  2 mg Oral PRN Jadapalle, Sree, MD   2 mg at 08/10/24 1243   oxyCODONE  (Oxy IR/ROXICODONE )  immediate release tablet 5 mg  5 mg Oral Q6H PRN Jadapalle, Sree, MD   5 mg at  08/11/24 1555   QUEtiapine  (SEROQUEL ) tablet 400 mg  400 mg Oral QHS Jadapalle, Sree, MD   400 mg at 08/11/24 2013   traZODone  (DESYREL ) tablet 50 mg  50 mg Oral QHS PRN Jadapalle, Sree, MD   50 mg at 08/11/24 2013   venlafaxine  XR (EFFEXOR -XR) 24 hr capsule 75 mg  75 mg Oral Q breakfast Cosandra Plouffe B, NP   75 mg at 08/12/24 9141    Lab Results:  No results found for this or any previous visit (from the past 48 hours).   Blood Alcohol level:  Lab Results  Component Value Date   [facility] <15 04/06/2024   ETH <15 02/11/2024    Metabolic Disorder Labs: Lab Results  Component Value Date   HGBA1C 6.2 (H) 08/09/2024   MPG 131.24 08/09/2024   MPG 148.46 09/14/2023   No results found for: PROLACTIN Lab Results  Component Value Date   CHOL 184 08/09/2024   TRIG 161 (H) 08/09/2024   HDL 30 (L) 08/09/2024   CHOLHDL 6.1 08/09/2024   VLDL 32 08/09/2024   LDLCALC 122 (H) 08/09/2024   LDLCALC 28 04/08/2024    Physical Findings: AIMS:  , ,  ,  ,    CIWA:    COWS:      Psychiatric Specialty Exam:  Presentation  General Appearance:  Appropriate for Environment  Eye Contact: Fair  Speech: Clear and Coherent  Speech Volume: Normal    Mood and Affect  Mood: Anxious  Affect: Congruent   Thought Process  Thought Processes: Coherent; Linear  Orientation:Full (Time, Place and Person)  Thought Content:Logical  Hallucinations:No data recorded  Ideas of Reference:None  Suicidal Thoughts:No data recorded  Homicidal Thoughts:No data recorded   Sensorium  Memory: Immediate Fair; Recent Fair; Remote Fair  Judgment: Fair  Insight: Shallow   Executive Functions  Concentration: Fair  Attention Span: Fair  Recall: Fair  Fund of Knowledge: Fair  Language: Fair   Psychomotor Activity  Psychomotor Activity: No data recorded  Musculoskeletal: Strength & Muscle Tone: within normal limits Gait & Station: Using walker Assets   Assets: Communication Skills; Desire for Improvement; Social Support    Physical Exam: Physical Exam Neurological:     Mental Status: She is alert and oriented to person, place, and time.    Review of Systems  Respiratory:  Negative for shortness of breath.   Cardiovascular:  Negative for chest pain.  Neurological:  Negative for dizziness and headaches.  Psychiatric/Behavioral:  Positive for depression and substance abuse. Negative for suicidal ideas. The patient is nervous/anxious.    Blood pressure 130/79, pulse 81, temperature 98 F (36.7 C), temperature source Oral, resp. rate 18, height 5' 7 (1.702 m), weight 79.9 kg, SpO2 98%. Body mass index is 27.6 kg/m.  Diagnosis: Principal Problem:   MDD (major depressive disorder), recurrent severe, without psychosis (HCC)   PLAN: Safety and Monitoring:  -- Voluntary admission to inpatient psychiatric unit for safety, stabilization and treatment  -- Daily contact with patient to assess and evaluate symptoms and progress in treatment  -- Patient's case to be discussed in multi-disciplinary team meeting  -- Observation Level : q15 minute checks  -- Vital signs:  q12 hours  -- Precautions: suicide, elopement, and assault -- Encouraged patient to participate in unit milieu and in scheduled group therapies  2. Psychiatric Treatment:  Scheduled Medications: -Continue Effexor  to 75  mg daily  -continue Seroquel  400 mg daily at bedtime -Nicotine  replacement therapy ordered -Hydroxyzine  25 mg 3 times daily as needed for anxiety    -- The risks/benefits/side-effects/alternatives to this medication were discussed in detail with the patient and time was given for questions. The patient consents to medication trial.  3. Medical Issues Being Addressed:   continuing current home prescribed medications -Patient was agreeable to wound care consult- see orders for detailed recommendations   4. Discharge Planning:   -- Social work and case  management to assist with discharge planning and identification of hospital follow-up needs prior to discharge  -- Estimated LOS: 3-4 days  [name], NP This note was created using [employer]. Please excuse any inadvertent transcription errors. Case was discussed with supervising physician Dr. Jadapalle who is agreeable with current plan.

## 2024-08-12 NOTE — Plan of Care (Signed)

## 2024-08-12 NOTE — Group Note (Signed)
 Recreation Therapy Group Note   Group Topic:Healthy Support Systems  Group Date: 08/12/2024 Start Time: 1020 End Time: 1115 Facilitators: Celestia Jeoffrey FORBES ARTICE, CTRS Location: Craft Room  Group Description: Straw Bridge. In groups or individually, patients were given 10 plastic drinking straws and an equal length of masking tape. Using the materials provided, patients were instructed to build a free-standing bridge-like structure to suspend an everyday item (ex: deck of cards) off the floor or table surface. All materials were required to be used in secondary school teacher. LRT facilitated post-activity discussion reviewing the importance of having strong and healthy support systems in our lives. LRT discussed how the people in our lives serve as the tape and the deck of cards we placed on top of our straw structure are the stressors we face in daily life. LRT and pts discussed what happens in our life when things get too heavy for us , and we don't have strong supports outside of the hospital. Pt shared 2 of their healthy supports in their life aloud in the group.   Goal Area(s) Addressed:  Patient will identify 2 healthy supports in their life. Patient will identify skills to successfully complete activity. Patient will identify correlation of this activity to life post-discharge.  Patient will build on frustration tolerance skills. Patient will increase team building and communication skills.    Affect/Mood: N/A   Participation Level: Did not attend    Clinical Observations/Individualized Feedback: Patient did not attend.  Plan: Continue to engage patient in RT group sessions 2-3x/week.   Jeoffrey FORBES Celestia, LRT, CTRS 08/12/2024 12:03 PM

## 2024-08-12 NOTE — Progress Notes (Signed)
" °   08/12/24 1100  Psych Admission Type (Psych Patients Only)  Admission Status Involuntary  Psychosocial Assessment  Patient Complaints None  Eye Contact Fair  Facial Expression Flat  Affect Flat  Speech Logical/coherent  Interaction Assertive  Motor Activity Slow  Appearance/Hygiene Unremarkable  Behavior Characteristics Cooperative  Mood Pleasant  Thought Process  Coherency WDL  Content WDL  Delusions None reported or observed  Perception WDL  Hallucination None reported or observed  Judgment Impaired  Confusion None  Danger to Self  Current suicidal ideation? Denies  Agreement Not to Harm Self Yes  Danger to Others  Danger to Others None reported or observed  Danger to Others Abnormal  Harmful Behavior to others No threats or harm toward other people  Destructive Behavior No threats or harm toward property    "

## 2024-08-12 NOTE — Group Note (Signed)
 Date:  08/12/2024 Time:  9:13 PM  Group Topic/Focus:  Managing Feelings:   The focus of this group is to identify what feelings patients have difficulty handling and develop a plan to handle them in a healthier way upon discharge.    Participation Level:  Did Not Attend  Participation Quality:  none  Affect:  none  Cognitive:  none  Insight: None  Engagement in Group:  none  Modes of Intervention:  none  Additional Comments:    Ginny JONETTA Galeazzi 08/12/2024, 9:13 PM

## 2024-08-12 NOTE — Group Note (Signed)
 Date:  08/12/2024 Time:  10:58 AM  Group Topic/Focus:  Managing Feelings:   The focus of this group is to identify what feelings patients have difficulty handling and develop a plan to handle them in a healthier way upon discharge.    Participation Level:  Active  Participation Quality:  Appropriate  Affect:  Appropriate  Cognitive:  Appropriate  Insight: Appropriate  Engagement in Group:  Engaged  Modes of Intervention:  Activity  Additional Comments:    Deryl Giroux 08/12/2024, 10:58 AM

## 2024-08-13 ENCOUNTER — Other Ambulatory Visit: Payer: Self-pay | Admitting: Psychiatry

## 2024-08-13 MED ORDER — OXYCODONE HCL 5 MG PO TABS: 5.0000 mg | ORAL_TABLET | Freq: Four times a day (QID) | ORAL | 0 refills | Status: DC | PRN

## 2024-08-13 MED ORDER — IPRATROPIUM-ALBUTEROL 0.5-2.5 (3) MG/3ML IN SOLN: 3.0000 mL | RESPIRATORY_TRACT | 0 refills | Status: AC | PRN

## 2024-08-13 MED ORDER — VENLAFAXINE HCL ER 75 MG PO CP24: 75.0000 mg | ORAL_CAPSULE | Freq: Every day | ORAL | 0 refills | Status: AC

## 2024-08-13 MED ORDER — GABAPENTIN 300 MG PO CAPS: 600.0000 mg | ORAL_CAPSULE | Freq: Three times a day (TID) | ORAL | 1 refills | Status: AC

## 2024-08-13 MED ORDER — OXYCODONE HCL 5 MG PO TABS: 5.0000 mg | ORAL_TABLET | Freq: Four times a day (QID) | ORAL | 0 refills | Status: AC | PRN

## 2024-08-13 MED ORDER — QUETIAPINE FUMARATE 400 MG PO TABS: 400.0000 mg | ORAL_TABLET | Freq: Every day | ORAL | 1 refills | Status: AC

## 2024-08-13 NOTE — Plan of Care (Signed)
   Problem: Education: Goal: Emotional status will improve Outcome: Progressing Goal: Mental status will improve Outcome: Progressing

## 2024-08-13 NOTE — Progress Notes (Signed)
Discharge Note:  Patient denies SI/HI/AVH at this time. Discharge instructions, AVS, prescriptions, and transition record gone over with patient. Patient agrees to comply with medication management, follow-up visit, and outpatient therapy. Patient belongings returned to patient. Patient questions and concerns addressed and answered. Patient ambulatory off unit. Patient discharged to home with parents.

## 2024-08-13 NOTE — Group Note (Signed)
 Recreation Therapy Group Note   Group Topic:Animal Assisted Therapy   Group Date: 08/13/2024 Start Time: 1000 End Time: 1030 Facilitators: Celestia Jeoffrey BRAVO, LRT, CTRS Location: Dayroom  Group Description: AAA. Animal-Assisted Activity provides opportunities for motivational, educational, therapeutic and/or recreational benefits to enhance quality of life. Selinda and Rollo visited the unit to interact with patients.   Goal Areas Addressed:  Reduced anxiety and stress Improved mood Increased social interaction Enhanced communication skills Reduced loneliness and isolation Improved emotional regulation   Affect/Mood: N/A   Participation Level: Did not attend    Clinical Observations/Individualized Feedback: Patient did not attend.  Plan: Continue to engage patient in RT group sessions 2-3x/week.   Jeoffrey BRAVO Celestia, LRT, CTRS 08/13/2024 11:32 AM

## 2024-08-13 NOTE — BHH Counselor (Signed)
 CSW touched base with Andrea Sanford, mom [phone] or [phone] to engage in safe discharge planning.   Mother confirmed that patient can return to her home for a few days.   Mother provided address to CSW and reported that patient will need taxi as I don't drive.  Mother reported no safety concerns and confirmed there are no weapons in the home.   Andrea Sanford, MSW, LCSWA 08/13/2024 9:00 AM

## 2024-08-13 NOTE — BHH Counselor (Signed)
 CSW contacted [facility] (ACT) office following the patients report that she was currently receiving services through the team. CSW spoke with Romero, [employer], who reported that the patient is not currently established with or receiving services through [facility] at this time.  Romero reported that the patient has repeatedly contacted the [facility] crisis line while inpatient and was informed that utilizing the crisis line during inpatient hospitalization is not appropriate protocol. Romero stated that she plans to follow up with the patient after discharge to complete an assessment for potential service initiation.  CSW provided Romero with the patients discharge address (mothers residence), which appears to be the patients most stable address at this time. CSW also communicated with the patient regarding the need to follow up with [facility] post-discharge for assessment.  CSW to continue to assess.   Susumu Hackler, MSW, LCSWA 08/13/2024 8:58 AM

## 2024-08-13 NOTE — BHH Suicide Risk Assessment (Signed)
 [facility] Discharge Suicide Risk Assessment   Principal Problem: MDD (major depressive disorder), recurrent severe, without psychosis (HCC) Discharge Diagnoses: Principal Problem:   MDD (major depressive disorder), recurrent severe, without psychosis (HCC)   Total Time spent with patient: 30 minutes  Musculoskeletal: Strength & Muscle Tone: within normal limits Gait & Station: normal Patient leans: N/A  Psychiatric Specialty Exam  Presentation  General Appearance:  Appropriate for Environment  Eye Contact: Fair  Speech: Clear and Coherent  Speech Volume: Normal  Handedness: Right   Mood and Affect  Mood: Euthymic  Duration of Depression Symptoms: Greater than two weeks  Affect: Appropriate   Thought Process  Thought Processes: Coherent  Descriptions of Associations:Intact  Orientation:Full (Time, Place and Person)  Thought Content:Logical  History of Schizophrenia/Schizoaffective disorder:No  Duration of Psychotic Symptoms:N/A  Hallucinations:Hallucinations: None  Ideas of Reference:None  Suicidal Thoughts:Suicidal Thoughts: No  Homicidal Thoughts:Homicidal Thoughts: No   Sensorium  Memory: Immediate Fair  Judgment: Fair  Insight: Fair   Art Therapist  Concentration: Fair  Attention Span: Fair  Recall: Fair  Fund of Knowledge: Fair  Language: Fair   Psychomotor Activity  Psychomotor Activity: Psychomotor Activity: Normal   Assets  Assets: Housing; Social Support   Sleep  Sleep: Sleep: Fair  Estimated Sleeping Duration (Last 24 Hours): 6.00-8.25 hours  Physical Exam: Physical Exam ROS Blood pressure (!) 131/95, pulse 77, temperature 97.6 F (36.4 C), temperature source Oral, resp. rate 18, height 5' 7 (1.702 m), weight 79.9 kg, SpO2 97%. Body mass index is 27.6 kg/m.  Mental Status Per Nursing Assessment::   On Admission:  Suicidal ideation indicated by patient  Demographic Factors:  Low  socioeconomic status  Loss Factors: Decrease in vocational status  Historical Factors: Impulsivity  Risk Reduction Factors:   Living with another person, especially a relative, Positive social support, Positive therapeutic relationship, and Positive coping skills or problem solving skills  Continued Clinical Symptoms:  Bipolar Disorder:   Bipolar II  Cognitive Features That Contribute To Risk:  None    Suicide Risk:  Minimal: No identifiable suicidal ideation.  Patients presenting with no risk factors but with morbid ruminations; may be classified as minimal risk based on the severity of the depressive symptoms   Follow-up Information     [facility] Follow up.   Why: Following discharge, [facility] will reach out to you to schedule an assessment for possible ACTT/CST services. Please follow up with them as soon as possible. Contact information: [address] [address] [phone]         [facility] Follow up.   Why: In person assessment for therapy and psychiatry appointment is 08/16/24 at 10 AM. Contact information: [address] [place] [address] [phone]                 Plan Of Care/Follow-up recommendations:  Activity:  As tolerated  Allyn Foil, MD 08/13/2024, 10:52 AM

## 2024-08-13 NOTE — Progress Notes (Signed)
" °   08/13/24 0749  Psych Admission Type (Psych Patients Only)  Admission Status Involuntary  Psychosocial Assessment  Patient Complaints None  Eye Contact Fair  Facial Expression Flat  Affect Flat  Speech Logical/coherent  Interaction Assertive  Motor Activity Slow  Appearance/Hygiene Unremarkable  Behavior Characteristics Cooperative  Mood Pleasant  Thought Process  Coherency WDL  Content WDL  Delusions None reported or observed  Perception WDL  Hallucination None reported or observed  Judgment Limited  Confusion None  Danger to Self  Current suicidal ideation? Denies  Danger to Others  Danger to Others None reported or observed    "

## 2024-08-13 NOTE — Progress Notes (Signed)
" °  [facility] Adult Case Management Discharge Plan :  Will you be returning to the same living situation after discharge:  No. Patient to discharge to mother's home.  At discharge, do you have transportation home?: Yes,  CSW has arranged taxi services on patient's behalf.  Do you have the ability to pay for your medications: Yes,   [employer] / [employer]  Release of information consent forms completed and in the chart;  Patient's signature needed at discharge.  Patient to Follow up at:  Follow-up Information     [facility]  [facility] Follow up.   Why: Following discharge, Waynard Leavell will reach out to you to schedule an assessment for possible ACTT/CST services. Please follow up with them as soon as possible. Contact information: [address] [address] [phone]         [facility], [facility] Follow up.   Why: In person assessment for therapy and psychiatry appointment is 08/16/24 at 10 AM. Contact information: [address] [place] [address] [phone]                 Next level of care provider has access to [facility] Link:no  Safety Planning and Suicide Prevention discussed: [place]  Roderick Cheese, mom [phone] or [phone]     Has patient been referred to the Quitline?: Patient refused referral for treatment  Patient has been referred for addiction treatment: Yes, the patient will follow up with an outpatient provider for substance use disorder. Psychiatrist/APP: appointment made and Therapist: appointment made  Alveta CHRISTELLA Kerns, LCSW 08/13/2024, 8:59 AM "

## 2024-08-13 NOTE — Discharge Summary (Signed)
 " Physician Discharge Summary Note  Patient:  Andrea Sanford is an 54 y.o., female MRN:  [id number] DOB:  [birth year] Patient phone:  [phone] (home)  Patient address:   [address] [place] [address],   Total time spent: 40 min Date of Admission:  08/08/2024 Date of Discharge: 08/13/2024  Reason for Admission:  54 year old female with past medical history significant for polysubstance abuse and bipolar disorder who presented after being found unresponsive in the setting of suspected drug overdose with possible baclofen  (UDS positive for cannabinoid and cocaine) requiring intubation and mechanical ventilation for airway protection.  Patient was medically stabilized and cleared for psychiatric inpatient hospitalization.Patient is admitted to  psych unit with Q15 min safety monitoring. Multidisciplinary team approach is offered. Medication management; group/milieu therapy is offered.   Principal Problem: MDD (major depressive disorder), recurrent severe, without psychosis (HCC) Discharge Diagnoses: Principal Problem:   MDD (major depressive disorder), recurrent severe, without psychosis (HCC)   Past Psychiatric History: see h&p  Family Psychiatric  History: see h&p Social History:  Social History   Substance and Sexual Activity  Alcohol Use Not Currently   Alcohol/week: 2.0 standard drinks of alcohol   Types: 2 Standard drinks or equivalent per week     Social History   Substance and Sexual Activity  Drug Use Yes   Types: Cocaine   Comment: uses on occasions    Social History   Socioeconomic History   Marital status: Divorced    Spouse name: Not on file   Number of children: Not on file   Years of education: Not on file   Highest education level: Not on file  Occupational History   Not on file  Tobacco Use   Smoking status: Every Day    Current packs/day: 1.00    Types: Cigarettes    Passive exposure: Current   Smokeless tobacco: Never  Vaping Use   Vaping  status: Some Days  Substance and Sexual Activity   Alcohol use: Not Currently    Alcohol/week: 2.0 standard drinks of alcohol    Types: 2 Standard drinks or equivalent per week   Drug use: Yes    Types: Cocaine    Comment: uses on occasions   Sexual activity: Yes  Other Topics Concern   Not on file  Social History Narrative   Based on results of PHQ 9 and GAD 7 recommendation that patient follow up within in one week for an assessment with Powell Pesa LCSW.    Social Drivers of Health   Tobacco Use: High Risk (08/08/2024)   Patient History    Smoking Tobacco Use: Every Day    Smokeless Tobacco Use: Never    Passive Exposure: Current  Financial Resource Strain: Not on file  Food Insecurity: Food Insecurity Present (08/08/2024)   Epic    Worried About Programme Researcher, Broadcasting/film/video in the Last Year: Sometimes true    Ran Out of Food in the Last Year: Sometimes true  Transportation Needs: Unmet Transportation Needs (08/08/2024)   Epic    Lack of Transportation (Medical): Yes    Lack of Transportation (Non-Medical): Yes  Physical Activity: Not on file  Stress: Not on file  Social Connections: Not on file  Depression (PHQ2-9): High Risk (05/21/2024)   Depression (PHQ2-9)    PHQ-2 Score: 21  Alcohol Screen: Low Risk (08/08/2024)   Alcohol Screen    Last Alcohol Screening Score (AUDIT): 2  Housing: High Risk (08/08/2024)   Epic    Unable  to Pay for Housing in the Last Year: Yes    Number of Times Moved in the Last Year: 0    Homeless in the Last Year: Yes  Utilities: Not At Risk (08/08/2024)   Epic    Threatened with loss of utilities: No  Health Literacy: Not on file   Past Medical History:  Past Medical History:  Diagnosis Date   Allergy    Anxiety    Arthritis    Asthma    Bipolar 1 disorder (HCC)    Cancer (HCC)    COPD (chronic obstructive pulmonary disease) (HCC)    Depression    Diabetes mellitus without complication (HCC)    Drug abuse (HCC)    Hypertension     Neuromuscular disorder (HCC)    Osteoporosis    PTSD (post-traumatic stress disorder) 04/07/2024   Sleep apnea     Past Surgical History:  Procedure Laterality Date   ANKLE SURGERY Right    IR GASTROSTOMY TUBE MOD SED  09/04/2023   IR GASTROSTOMY TUBE REMOVAL  12/13/2023   IR PATIENT EVAL TECH 0-60 MINS  11/03/2023   PERIPHERAL VASCULAR THROMBECTOMY Left 1991   TRACHEOSTOMY TUBE PLACEMENT N/A 09/01/2023   Procedure: TRACHEOSTOMY;  Surgeon: Blair Mt, MD;  Location: [facility] ORS;  Service: ENT;  Laterality: N/A;   Family History:  Family History  Problem Relation Age of Onset   Depression Mother    Diabetes Mother    Cancer Father     Hospital Course:  54 year old female with past medical history significant for polysubstance abuse and bipolar disorder who presented after being found unresponsive in the setting of suspected drug overdose with possible baclofen  (UDS positive for cannabinoid and cocaine) requiring intubation and mechanical ventilation for airway protection.  Patient was medically stabilized and cleared for psychiatric inpatient hospitalization.Patient is admitted to  psych unit with Q15 min safety monitoring. Multidisciplinary team approach is offered. Medication management; group/milieu therapy is offered.   On admission,  Detailed risk assessment is complete based on clinical exam and individual risk factors and acute suicide risk is low and acute violence risk is low.    On the day of discharge, patient denies SI/HI/plan and denies hallucinations.  Patient remains future oriented and is willing to participate in outpatient mental health services.  Currently, all modifiable risk of harm to self/harm to others have been addressed and patient is no longer appropriate for the acute inpatient setting and is able to continue treatment for mental health needs in the community with the supports as indicated below.  Patient is educated and verbalized understanding of discharge plan  of care including medications, follow-up appointments, mental health resources and further crisis services in the community.  He is instructed to call 911 or present to the nearest emergency room should he experience any decompensation in mood, disturbance of bowel or return of suicidal/homicidal ideations.  Patient verbalizes understanding of this education and agrees to this plan of care  Physical Findings: AIMS:  , ,  ,  ,    CIWA:    COWS:      Psychiatric Specialty Exam:  Presentation  General Appearance:  Appropriate for Environment  Eye Contact: Fair  Speech: Clear and Coherent  Speech Volume: Normal    Mood and Affect  Mood: Euthymic  Affect: Appropriate   Thought Process  Thought Processes: Coherent  Descriptions of Associations:Intact  Orientation:Full (Time, Place and Person)  Thought Content:Logical  Hallucinations:Hallucinations: None  Ideas of Reference:None  Suicidal Thoughts:Suicidal Thoughts:  No  Homicidal Thoughts:Homicidal Thoughts: No   Sensorium  Memory: Immediate Fair  Judgment: Fair  Insight: Fair   Chartered Certified Accountant: Fair  Attention Span: Fair  Recall: [employer] of Knowledge: Fair  Language: Fair   Psychomotor Activity  Psychomotor Activity: Psychomotor Activity: Normal  Musculoskeletal: Strength & Muscle Tone: within normal limits Gait & Station: normal Assets  Assets: Housing; Social Support   Sleep  Sleep: Sleep: Fair    Physical Exam: Physical Exam ROS Blood pressure (!) 131/95, pulse 77, temperature 97.6 F (36.4 C), temperature source Oral, resp. rate 18, height 5' 7 (1.702 m), weight 79.9 kg, SpO2 97%. Body mass index is 27.6 kg/m.   Tobacco Use History[1] Tobacco Cessation:  A prescription for an FDA-approved tobacco cessation medication was offered at discharge and the patient refused   Blood Alcohol level:  Lab Results  Component Value Date   [facility] <15  04/06/2024   ETH <15 02/11/2024    Metabolic Disorder Labs:  Lab Results  Component Value Date   HGBA1C 6.2 (H) 08/09/2024   MPG 131.24 08/09/2024   MPG 148.46 09/14/2023   No results found for: PROLACTIN Lab Results  Component Value Date   CHOL 184 08/09/2024   TRIG 161 (H) 08/09/2024   HDL 30 (L) 08/09/2024   CHOLHDL 6.1 08/09/2024   VLDL 32 08/09/2024   LDLCALC 122 (H) 08/09/2024   LDLCALC 28 04/08/2024    See Psychiatric Specialty Exam and Suicide Risk Assessment completed by Attending Physician prior to discharge.  Discharge destination:  ALF  Is patient on multiple antipsychotic therapies at discharge:  No   Has Patient had three or more failed trials of antipsychotic monotherapy by history:  No  Recommended Plan for Multiple Antipsychotic Therapies: NA   Allergies as of 08/13/2024   No Known Allergies      Medication List     STOP taking these medications    Accu-Chek Guide Test test strip Generic drug: glucose blood   albuterol  108 (90 Base) MCG/ACT inhaler Commonly known as: VENTOLIN  HFA   cetirizine  10 MG tablet Commonly known as: ZYRTEC    lactulose  10 GM/15ML solution Commonly known as: CHRONULAC    lidocaine  5 % Commonly known as: Lidoderm    montelukast  10 MG tablet Commonly known as: SINGULAIR    nicotine  21 mg/24hr patch Commonly known as: NICODERM CQ  - dosed in mg/24 hours   OLANZapine  5 MG tablet Commonly known as: ZYPREXA        TAKE these medications      Indication  amLODipine  5 MG tablet Commonly known as: NORVASC  Take 1 tablet (5 mg total) by mouth daily.  Indication: High Blood Pressure   atorvastatin  40 MG tablet Commonly known as: LIPITOR Take 1 tablet (40 mg total) by mouth daily.  Indication: High Amount of Fats in the Blood   gabapentin  300 MG capsule Commonly known as: NEURONTIN  Take 2 capsules (600 mg total) by mouth 3 (three) times daily.  Indication: Social Anxiety Disorder   ipratropium-albuterol   0.5-2.5 (3) MG/3ML Soln Commonly known as: DUONEB Take 3 mLs by nebulization every 4 (four) hours as needed.  Indication: Chronic Bronchitis   meloxicam  15 MG tablet Commonly known as: MOBIC  Take 1 tablet (15 mg total) by mouth daily.  Indication: Acute Pain   oxyCODONE  5 MG immediate release tablet Commonly known as: Oxy IR/ROXICODONE  Take 1 tablet (5 mg total) by mouth every 6 (six) hours as needed for up to 5 days for moderate pain (pain score  4-6) or severe pain (pain score 7-10).  Indication: Acute Pain   QUEtiapine  400 MG tablet Commonly known as: SEROquel  Take 1 tablet (400 mg total) by mouth at bedtime.  Indication: Manic-Depression   venlafaxine  XR 75 MG 24 hr capsule Commonly known as: EFFEXOR -XR Take 1 capsule (75 mg total) by mouth daily with breakfast. Start taking on: August 14, 2024 What changed:  medication strength how much to take  Indication: Generalized Anxiety Disorder        Follow-up Information     [facility] Follow up.   Why: Following discharge, Waynard Leavell will reach out to you to schedule an assessment for possible ACTT/CST services. Please follow up with them as soon as possible. Contact information: [address] [place] [address] [phone]         [facility], [facility] Follow up.   Why: In person assessment for therapy and psychiatry appointment is 08/16/24 at 10 AM. Contact information: [address] [place] [address] [phone]                 Follow-up recommendations:  Activity:  As tolerated    Signed: Robey Massmann, MD 08/13/2024, 10:54 AM          [1]  Social History Tobacco Use  Smoking Status Every Day   Current packs/day: 1.00   Types: Cigarettes   Passive exposure: Current  Smokeless Tobacco Never   "

## 2024-08-13 NOTE — Plan of Care (Signed)
   Problem: Education: Goal: Emotional status will improve Outcome: Progressing

## 2024-08-14 ENCOUNTER — Telehealth: Payer: Self-pay

## 2024-08-14 NOTE — Telephone Encounter (Signed)
 I see a letter in pt's chart that is showing that pt was dismissed from the practice. Please advise on what we need to do about this since pt keeps calling.

## 2024-08-14 NOTE — Telephone Encounter (Signed)
 Copied from CRM [id number]. Topic: Clinical - Medical Advice >> Aug 12, 2024  7:59 AM Tiffini S wrote: Reason for CRM: Patient is in the hospital and asked for a call back to discuss the rollator walker information- please call the patient back at [phone] and [phone]

## 2024-08-14 NOTE — Telephone Encounter (Signed)
 Copied from CRM #[id number]. Topic: General - Other >> Aug 14, 2024  2:43 PM Joesph NOVAK wrote: Reason for CRM: patient wants to know why was she discharged from practice? Also she ordered a rollator walker. Please call patient.

## 2024-08-20 ENCOUNTER — Inpatient Hospital Stay: Payer: MEDICAID | Admitting: Family Medicine

## 2024-08-21 ENCOUNTER — Telehealth: Payer: Self-pay | Admitting: Family Medicine

## 2024-08-21 NOTE — Telephone Encounter (Signed)
 Copied from CRM #[id number]. Topic: Referral - Status >> Aug 21, 2024 11:39 AM Viola F wrote: Reason for CRM: Patient needs referral resent to Chiropractic Medicine so she can get scheduled. She would also like a call back with an update on the rollator. She's aware she was discharged from the practice and will be getting a new provider but wants to know if these things will be taken care of?   Ludie Cobb's office was sent a referral on 06/28/24 and again today. Patient can call [phone] to set up an appointment.  Her insurance is not accepted by most suppliers that have been contacted or are out of the area. Please find her a supplier in the area that takes her insurance.
